# Patient Record
Sex: Male | Born: 1935 | Race: White | Hispanic: No | Smoking: Never smoker
Health system: Southern US, Community
[De-identification: ages and names within clinical notes are randomized; demographics above are authoritative.]

## PROBLEM LIST (undated history)

## (undated) DIAGNOSIS — K819 Cholecystitis, unspecified: Secondary | ICD-10-CM

## (undated) DIAGNOSIS — E039 Hypothyroidism, unspecified: Secondary | ICD-10-CM

## (undated) DIAGNOSIS — I2510 Atherosclerotic heart disease of native coronary artery without angina pectoris: Secondary | ICD-10-CM

## (undated) DIAGNOSIS — E785 Hyperlipidemia, unspecified: Secondary | ICD-10-CM

## (undated) DIAGNOSIS — I420 Dilated cardiomyopathy: Secondary | ICD-10-CM

## (undated) DIAGNOSIS — Z79899 Other long term (current) drug therapy: Secondary | ICD-10-CM

## (undated) DIAGNOSIS — I10 Essential (primary) hypertension: Secondary | ICD-10-CM

## (undated) DIAGNOSIS — J449 Chronic obstructive pulmonary disease, unspecified: Secondary | ICD-10-CM

## (undated) DIAGNOSIS — I4891 Unspecified atrial fibrillation: Secondary | ICD-10-CM

## (undated) HISTORY — DX: Atherosclerotic heart disease of native coronary artery without angina pectoris: I25.10

## (undated) HISTORY — DX: Other long term (current) drug therapy: Z79.899

## (undated) HISTORY — DX: Hyperlipidemia, unspecified: E78.5

## (undated) HISTORY — DX: Essential (primary) hypertension: I10

## (undated) HISTORY — DX: Unspecified atrial fibrillation: I48.91

## (undated) HISTORY — DX: Hypothyroidism, unspecified: E03.9

## (undated) HISTORY — PX: HIP SURGERY: SHX245

## (undated) HISTORY — DX: Dilated cardiomyopathy: I42.0

## (undated) HISTORY — PX: CORONARY STENT PLACEMENT: SHX1402

---

## 1998-06-08 ENCOUNTER — Ambulatory Visit (HOSPITAL_COMMUNITY): Admission: RE | Admit: 1998-06-08 | Discharge: 1998-06-08 | Payer: Self-pay

## 1998-10-18 ENCOUNTER — Ambulatory Visit (HOSPITAL_COMMUNITY): Admission: RE | Admit: 1998-10-18 | Discharge: 1998-10-18 | Payer: Self-pay | Admitting: *Deleted

## 1998-10-18 ENCOUNTER — Encounter: Payer: Self-pay | Admitting: Family Medicine

## 1998-11-23 ENCOUNTER — Encounter: Payer: Self-pay | Admitting: Thoracic Surgery

## 1998-11-24 ENCOUNTER — Ambulatory Visit (HOSPITAL_COMMUNITY): Admission: RE | Admit: 1998-11-24 | Discharge: 1998-11-24 | Payer: Self-pay | Admitting: Thoracic Surgery

## 1999-07-25 ENCOUNTER — Encounter: Admission: RE | Admit: 1999-07-25 | Discharge: 1999-07-25 | Payer: Self-pay | Admitting: Thoracic Surgery

## 2002-05-13 ENCOUNTER — Ambulatory Visit (HOSPITAL_COMMUNITY): Admission: RE | Admit: 2002-05-13 | Discharge: 2002-05-13 | Payer: Self-pay | Admitting: Family Medicine

## 2002-05-13 ENCOUNTER — Encounter: Payer: Self-pay | Admitting: Family Medicine

## 2002-06-23 ENCOUNTER — Ambulatory Visit (HOSPITAL_COMMUNITY): Admission: RE | Admit: 2002-06-23 | Discharge: 2002-06-23 | Payer: Self-pay | Admitting: Gastroenterology

## 2003-12-13 ENCOUNTER — Inpatient Hospital Stay (HOSPITAL_COMMUNITY): Admission: EM | Admit: 2003-12-13 | Discharge: 2003-12-15 | Payer: Self-pay | Admitting: Emergency Medicine

## 2004-01-26 ENCOUNTER — Ambulatory Visit (HOSPITAL_COMMUNITY): Admission: RE | Admit: 2004-01-26 | Discharge: 2004-01-26 | Payer: Self-pay | Admitting: Neurosurgery

## 2004-11-15 ENCOUNTER — Ambulatory Visit: Payer: Self-pay | Admitting: Cardiology

## 2004-11-22 ENCOUNTER — Ambulatory Visit: Payer: Self-pay

## 2004-12-24 ENCOUNTER — Ambulatory Visit: Payer: Self-pay | Admitting: Cardiology

## 2005-12-12 ENCOUNTER — Ambulatory Visit: Payer: Self-pay | Admitting: Cardiology

## 2006-12-09 ENCOUNTER — Inpatient Hospital Stay (HOSPITAL_COMMUNITY): Admission: EM | Admit: 2006-12-09 | Discharge: 2006-12-10 | Payer: Self-pay | Admitting: Emergency Medicine

## 2006-12-09 ENCOUNTER — Ambulatory Visit: Payer: Self-pay | Admitting: Cardiology

## 2006-12-15 ENCOUNTER — Ambulatory Visit: Payer: Self-pay | Admitting: Cardiovascular Disease

## 2006-12-23 ENCOUNTER — Ambulatory Visit: Payer: Self-pay | Admitting: Cardiology

## 2006-12-23 ENCOUNTER — Encounter: Payer: Self-pay | Admitting: Cardiology

## 2006-12-23 ENCOUNTER — Ambulatory Visit: Payer: Self-pay

## 2006-12-31 ENCOUNTER — Ambulatory Visit: Payer: Self-pay | Admitting: Cardiovascular Disease

## 2006-12-31 ENCOUNTER — Ambulatory Visit: Payer: Self-pay | Admitting: Cardiology

## 2007-01-12 ENCOUNTER — Ambulatory Visit: Payer: Self-pay | Admitting: Cardiovascular Disease

## 2007-04-02 ENCOUNTER — Ambulatory Visit: Payer: Self-pay | Admitting: Cardiology

## 2007-04-11 ENCOUNTER — Encounter: Admission: RE | Admit: 2007-04-11 | Discharge: 2007-04-11 | Payer: Self-pay | Admitting: Family Medicine

## 2007-04-25 ENCOUNTER — Ambulatory Visit: Payer: Self-pay | Admitting: Cardiology

## 2007-04-25 ENCOUNTER — Inpatient Hospital Stay (HOSPITAL_COMMUNITY): Admission: EM | Admit: 2007-04-25 | Discharge: 2007-04-26 | Payer: Self-pay | Admitting: *Deleted

## 2007-05-06 ENCOUNTER — Ambulatory Visit: Payer: Self-pay | Admitting: Cardiology

## 2007-05-14 ENCOUNTER — Ambulatory Visit: Payer: Self-pay | Admitting: Cardiology

## 2007-05-14 ENCOUNTER — Inpatient Hospital Stay (HOSPITAL_COMMUNITY): Admission: EM | Admit: 2007-05-14 | Discharge: 2007-05-24 | Payer: Self-pay | Admitting: Emergency Medicine

## 2007-05-15 HISTORY — PX: CARDIAC CATHETERIZATION: SHX172

## 2007-05-27 ENCOUNTER — Ambulatory Visit: Payer: Self-pay | Admitting: Cardiology

## 2007-06-10 ENCOUNTER — Ambulatory Visit: Payer: Self-pay

## 2007-09-02 ENCOUNTER — Ambulatory Visit: Payer: Self-pay

## 2007-09-11 ENCOUNTER — Encounter: Payer: Self-pay | Admitting: Cardiology

## 2007-09-11 ENCOUNTER — Ambulatory Visit: Payer: Self-pay | Admitting: Cardiology

## 2007-11-18 ENCOUNTER — Ambulatory Visit: Payer: Self-pay | Admitting: Cardiology

## 2008-03-30 ENCOUNTER — Ambulatory Visit: Payer: Self-pay | Admitting: Cardiology

## 2008-05-03 ENCOUNTER — Ambulatory Visit: Payer: Self-pay | Admitting: Vascular Surgery

## 2008-06-15 ENCOUNTER — Ambulatory Visit: Payer: Self-pay | Admitting: Cardiology

## 2008-11-23 ENCOUNTER — Ambulatory Visit: Payer: Self-pay | Admitting: Cardiology

## 2009-01-05 DIAGNOSIS — I1310 Hypertensive heart and chronic kidney disease without heart failure, with stage 1 through stage 4 chronic kidney disease, or unspecified chronic kidney disease: Secondary | ICD-10-CM | POA: Insufficient documentation

## 2009-01-05 DIAGNOSIS — E785 Hyperlipidemia, unspecified: Secondary | ICD-10-CM

## 2009-01-05 DIAGNOSIS — N19 Unspecified kidney failure: Secondary | ICD-10-CM

## 2009-01-05 DIAGNOSIS — I428 Other cardiomyopathies: Secondary | ICD-10-CM

## 2009-03-31 ENCOUNTER — Encounter (INDEPENDENT_AMBULATORY_CARE_PROVIDER_SITE_OTHER): Payer: Self-pay | Admitting: *Deleted

## 2009-05-31 ENCOUNTER — Ambulatory Visit: Payer: Self-pay | Admitting: Cardiology

## 2009-08-30 ENCOUNTER — Encounter (INDEPENDENT_AMBULATORY_CARE_PROVIDER_SITE_OTHER): Payer: Self-pay | Admitting: *Deleted

## 2009-09-05 ENCOUNTER — Encounter (INDEPENDENT_AMBULATORY_CARE_PROVIDER_SITE_OTHER): Payer: Self-pay | Admitting: Cardiology

## 2009-11-29 ENCOUNTER — Ambulatory Visit: Payer: Self-pay | Admitting: Cardiology

## 2010-11-13 NOTE — Assessment & Plan Note (Signed)
Summary: [facility]  Medications Added FENOFIBRATE 54 MG TABS (FENOFIBRATE) 1 by mouth daily HYDROCHLOROTHIAZIDE 25 MG TABS (HYDROCHLOROTHIAZIDE) 1 by mouth daily LEVOTHROID 125 MCG TABS (LEVOTHYROXINE SODIUM) 1 by mouth daily CARVEDILOL 12.5 MG TABS (CARVEDILOL) 1 by mouth two times a day LISINOPRIL 40 MG TABS (LISINOPRIL) 1 by mouth daily WARFARIN SODIUM 5 MG TABS (WARFARIN SODIUM) as directed AMLODIPINE BESYLATE 10 MG TABS (AMLODIPINE BESYLATE) 1 by mouth daily GABAPENTIN 300 MG CAPS (GABAPENTIN) 3 by mouth daily      Allergies Added: ! PENICILLIN  Visit Type:  Follow-up Primary Provider:  Dr. Elana Alm  CC:  CAD/cardiomyopathy.  History of Present Illness: The patient presents for six-month followup. Since I last saw him he has had no further problems.  He denies any chest discomfort, neck or arm discomfort. He has no palpitations, presyncope or syncope. He has had no PND or orthopnea.  He is compliant with his medications. He is up-to-date with a chest x-ray done in July and with his ophthalmologic examinations.  Current Medications (verified): 1)  Pravastatin Sodium 40 Mg Tabs (Pravastatin Sodium) .... Take One Tablet By Mouth Daily At Bedtime 2)  Amiodarone Hcl 200 Mg Tabs (Amiodarone Hcl) .Marland Kitchen.. 1 By Mouth Daily 3)  Amlodipine Besylate 10 Mg Tabs (Amlodipine Besylate) .Marland Kitchen.. 1 By Mouth Daily 4)  Fenofibrate 54 Mg Tabs (Fenofibrate) .Marland Kitchen.. 1 By Mouth Daily 5)  Hydrochlorothiazide 25 Mg Tabs (Hydrochlorothiazide) .Marland Kitchen.. 1 By Mouth Daily 6)  Levothroid 125 Mcg Tabs (Levothyroxine Sodium) .Marland Kitchen.. 1 By Mouth Daily 7)  Carvedilol 12.5 Mg Tabs (Carvedilol) .Marland Kitchen.. 1 By Mouth Two Times A Day 8)  Lisinopril 40 Mg Tabs (Lisinopril) .Marland Kitchen.. 1 By Mouth Daily 9)  Warfarin Sodium 5 Mg Tabs (Warfarin Sodium) .... As Directed 10)  Amlodipine Besylate 10 Mg Tabs (Amlodipine Besylate) .Marland Kitchen.. 1 By Mouth Daily 11)  Gabapentin 300 Mg Caps (Gabapentin) .... 3 By Mouth Daily  Allergies (verified): 1)  !  Penicillin  Past History:  Past Medical History: Dilated cardiomyopathy (felt to be secondary to atrial fibrillation.  His EF is about 40%), persistent atrial fibrillation treated with a amiodarone and cardioversion, chronic Coumadin therapy, coronary artery disease (last catheterization in August 2008 demonstrated the LAD 40% stenosis.   There was stent in the LAD which had 20% in-stent restenosis.  This was followed by 40 to 50% distal stenosis.  There was a 50% diagonal stenosis.  There was 40% to 50% circumflex stenosis.  There was 70% OM1 stenosis and 30% ostial right coronary artery stenosis.  There was 30% proximal right coronary artery stenosis), hypertension, hypothyroidism (possibly related to amiodarone), and dyslipidemia.   Past Surgical History: Left hip surgery  Review of Systems       As stated in the HPI and negative for all other systems.   Vital Signs:  Patient profile:   77 year old male Height:      67 inches Weight:      196 pounds BMI:     30.81 Pulse rate:   49 / minute Resp:     16 per minute BP sitting:   124 / 64  (right arm)  Vitals Entered By: Marrion Coy, CNA (November 29, 2009 12:03 PM)  Physical Exam  General:  Well developed, well nourished, in no acute distress. Head:  normocephalic and atraumatic Eyes:  PERRLA/EOM intact; conjunctiva and lids normal. Mouth:  edentulous, oral mucosa normal. Neck:  Neck supple, no JVD. No masses, thyromegaly or abnormal cervical nodes. Chest Wall:  no deformities or breast masses noted Lungs:  Clear bilaterally to auscultation and percussion. Heart:  Non-displaced PMI, chest non-tender; regular rate and rhythm, S1, S2 without murmurs, rubs or gallops. Carotid upstroke normal, no bruit. Normal abdominal aortic size, no bruits. Femorals normal pulses, no bruits. Pedals normal pulses. No edema, no varicosities. Abdomen:  Bowel sounds positive; abdomen soft and non-tender without masses, organomegaly, or  hernias noted. No hepatosplenomegaly. Msk:  Back normal, normal gait. Muscle strength and tone normal. Extremities:  No clubbing or cyanosis. Neurologic:  Alert and oriented x 3. Skin:  Intact without lesions or rashes. Psych:  Normal affect.   EKG  Procedure date:  11/29/2009  Findings:      sinus bradycardia, left bundle branch block, left axis deviation, premature ventricular contraction  Impression & Recommendations:  Problem # 1:  CARDIOMYOPATHY (ICD-425.4) The patient has class I symptoms. At this point he is on a reasonable medical regimen. I will make no change to this. Orders: EKG w/ Interpretation (93000)  Problem # 2:  HYPERTENSION (ICD-401.9) His blood pressure is controlled and he will continue on the meds as listed.  Problem # 3:  ATRIAL FIBRILLATION (ICD-427.31) He is maintaining sinus rhythm. He tolerates the amiodarone. He is up-to-date with his eye exam and chest x-ray. He knows he needs these yearly. I will send him for thyroid and liver tests.  Patient Instructions: 1)  Your physician recommends that you schedule a follow-up appointment in: 1 year in [place] 2)  Your physician recommends that you return for lab work for [facility] and liver profile 428.22 414.01  3)  Your physician recommends that you continue on your current medications as directed. Please refer to the Current Medication list given to you today. 4)  You have been diagnosed with atrial fibrillation.  Atrial fibrillation is a condition in which one of the upper chambers of the heart has extra electrical cells causing it to beat very fast.  Please see the handout/brochure given to you today for further information. 5)  You have been diagnosed with Congestive Heart Failure or CHF.  CHF is a condition in which a problem with the structure or function of the heart impairs its ability to supply sufficient blood flow to meet the body's needs.  For further information please visit www.cardiosmart.org for  detailed information on CHF. 6)  Your physician recommends that you weigh, daily, at the same time every day, and in the same amount of clothing.  Please record your daily weights on the handout provided and bring it to your next appointment.

## 2010-12-12 ENCOUNTER — Ambulatory Visit (INDEPENDENT_AMBULATORY_CARE_PROVIDER_SITE_OTHER): Payer: Medicare Other | Admitting: Cardiology

## 2010-12-12 ENCOUNTER — Encounter: Payer: Self-pay | Admitting: Cardiology

## 2010-12-12 DIAGNOSIS — I2510 Atherosclerotic heart disease of native coronary artery without angina pectoris: Secondary | ICD-10-CM | POA: Insufficient documentation

## 2010-12-20 NOTE — Assessment & Plan Note (Signed)
Summary: 1 YR ROV 428.22  414.01 PFN, RN  Medications Added LIPITOR 40 MG TABS (ATORVASTATIN CALCIUM) 1 by mouth daily LEVOTHROID 150 MCG TABS (LEVOTHYROXINE SODIUM) 1 by mouth daily      Allergies Added:   Visit Type:  Follow-up Primary Provider:  Dr. Elana Alm  CC:  CAD and Atrial fibrillation.  History of Present Illness: The patient presents for yearly followup.   Since I last saw him he has had no new cardiovascular complaints. In particular he has had chest pressure, neck or arm discomfort. He has no palpitations, presyncope or syncope. He has had no new shortness of breath, PND or orthopnea. He has no new weight gain or edema. He doesn't exercise but he remains active. Of note he does complain of some burning in his legs particularly at night and occasionally a sensation of being hot or tingly all over but both of these symptoms are not reproducible and are intermittent.  Current Medications (verified): 1)  Lipitor 40 Mg Tabs (Atorvastatin Calcium) .Marland Kitchen.. 1 By Mouth Daily 2)  Amiodarone Hcl 200 Mg Tabs (Amiodarone Hcl) .Marland Kitchen.. 1 By Mouth Daily 3)  Fenofibrate 54 Mg Tabs (Fenofibrate) .Marland Kitchen.. 1 By Mouth Daily 4)  Hydrochlorothiazide 25 Mg Tabs (Hydrochlorothiazide) .Marland Kitchen.. 1 By Mouth Daily 5)  Levothroid 150 Mcg Tabs (Levothyroxine Sodium) .Marland Kitchen.. 1 By Mouth Daily 6)  Carvedilol 12.5 Mg Tabs (Carvedilol) .Marland Kitchen.. 1 By Mouth Two Times A Day 7)  Lisinopril 40 Mg Tabs (Lisinopril) .Marland Kitchen.. 1 By Mouth Daily 8)  Warfarin Sodium 5 Mg Tabs (Warfarin Sodium) .... As Directed 9)  Amlodipine Besylate 10 Mg Tabs (Amlodipine Besylate) .Marland Kitchen.. 1 By Mouth Daily  Allergies (verified): 1)  ! Penicillin  Past History:  Past Medical History: Reviewed history from 11/29/2009 and no changes required. Dilated cardiomyopathy (felt to be secondary to atrial fibrillation.  His EF is about 40%), persistent atrial fibrillation treated with a amiodarone and cardioversion, chronic Coumadin therapy, coronary artery disease  (last catheterization in August 2008 demonstrated the LAD 40% stenosis.   There was stent in the LAD which had 20% in-stent restenosis.  This was followed by 40 to 50% distal stenosis.  There was a 50% diagonal stenosis.  There was 40% to 50% circumflex stenosis.  There was 70% OM1 stenosis and 30% ostial right coronary artery stenosis.  There was 30% proximal right coronary artery stenosis), hypertension, hypothyroidism (possibly related to amiodarone), and dyslipidemia.   Past Surgical History: Reviewed history from 11/29/2009 and no changes required. Left hip surgery  Review of Systems       Feet burning, tingling.  Otherwise all other reveiw of systems negatvie.  Vital Signs:  Patient profile:   77 year old male Height:      67 inches Weight:      191 pounds BMI:     30.02 Pulse rate:   50 / minute Resp:     18 per minute BP sitting:   126 / 72  (right arm)  Vitals Entered By: Marrion Coy, CNA (December 12, 2010 10:18 AM)  Physical Exam  General:  Well developed, well nourished, in no acute distress. Head:  normocephalic and atraumatic Eyes:  PERRLA/EOM intact; conjunctiva and lids normal. Neck:  Neck supple, no JVD. No masses, thyromegaly or abnormal cervical nodes. Chest Wall:  no deformities or breast masses noted Lungs:  Clear bilaterally to auscultation and percussion. Heart:  Non-displaced PMI, chest non-tender; regular rate and rhythm, S1, S2 without murmurs, rubs or gallops. Carotid upstroke normal, no  bruit. Normal abdominal aortic size, no bruits. Femorals normal pulses, no bruits. Pedals normal pulses. No edema, no varicosities. Abdomen:  Bowel sounds positive; abdomen soft and non-tender without masses, organomegaly, or hernias noted. No hepatosplenomegaly. Msk:  Back normal, normal gait. Muscle strength and tone normal. Extremities:  No clubbing or cyanosis. Neurologic:  Alert and oriented x 3. Skin:  Intact without lesions or rashes. Psych:  Normal  affect.    EKG  Procedure date:  12/12/2010  Findings:      Sinus bradycardia., LBBB, LAD, no change  Impression & Recommendations:  Problem # 1:  CAD (ICD-414.00) Is having no new symptoms. No further cardiovascular testing is suggested. We will continue with risk reduction.  I did discuss with him the probability that I do stress testing next year as it will have been 5 years since the last test.  Problem # 2:  HYPERTENSION (ICD-401.9) His blood pressure is controlled. Will continue the meds as listed.  Problem # 3:  ATRIAL FIBRILLATION (ICD-427.31) He has had no symptomatic arrhythmias. He tolerates amiodarone. He does have his thyroid medication regulated in all of his labs followed routinely.  Orders: EKG w/ Interpretation (93000)  Problem # 4:  DYSLIPIDEMIA (ICD-272.4) He recently was the Lipitor for better control but he is having trouble weakness. He's been on Crestor, simvastatin and pravastatin. I asked him to discuss his options with Dr. Elana Alm  Patient Instructions: 1)  Your physician recommends that you schedule a follow-up appointment in: 1 yr with Dr [place] in [place] 2)  Your physician recommends that you continue on your current medications as directed. Please refer to the Current Medication list given to you today.

## 2011-02-07 ENCOUNTER — Other Ambulatory Visit: Payer: Self-pay | Admitting: Cardiology

## 2011-02-26 NOTE — Assessment & Plan Note (Signed)
[facility]                            CARDIOLOGY OFFICE NOTE   NAME:[name]                   MRN:          [id number]  DATE:06/15/2008                            DOB:          [birth year]    PRIMARY CARE PHYSICIAN:  Bennie Pierini, Nurse Practitioner   REASON FOR PRESENTATION:  Evaluate the patient with cardiomyopathy and  atrial fibrillation.   HISTORY OF PRESENT ILLNESS:  The patient is an 77 year old.  He presents  for followup of the above.  In the last point, he was noted to be  hypothyroid probably related to his amiodarone.  He had been started on  Synthroid.  Since I saw him, his TSH is down to 30 from 70, and his T3  and T4 are within normal range, though low.  He has had his Synthroid  dose just recently increased.  Of note, he was instructed to stop his  simvastatin because the amiodarone and simvastatin interaction, but did  not do this.  He has had no new symptoms since I last saw him.  He  occasionally gets fatigued.  He says he feels better if he works.  He is  not having any cardiovascular symptoms.  Particularly, he is not having  shortness of breath, PND, or orthopnea.  He has not felt any  palpitation, presyncope, or syncope.  He has had no chest discomfort,  neck, or arm discomfort.  He is active, still working at [employer].   PAST MEDICAL HISTORY:  Dilated myopathy (felt to be secondary to atrial  fibrillation, his EF about 40%), persistent atrial fibrillation treated  with amiodarone and cardioversion, chronic Coumadin therapy, coronary  artery disease (last catheterization in August 2008, demonstrated the  LAD 40% stenosed.  There is a stent in the LAD, which had 20% in-stent  restenosis.  This was followed by 40 to 50% distal stenosis.  There was  50% diagonal stenosis.  There was 40 to 50 circumflex stenosis.  There  was 70% OM1 stenosis and 30% ostial right coronary artery stenosis.  There 30% proximal  right coronary artery stenosis), hypertension,  hypothyroidism (possibly related to amiodarone), and dyslipidemia.   ALLERGIES:  PENICILLIN.   MEDICATIONS:  1. Lisinopril 40 mg daily.  2. Amiodarone 200 mg daily.  3. Carvedilol 12.5 mg b.i.d.  4. Calcium.  5. Hydrochlorothiazide 25 mg daily.  6. Multivitamin.  7. Coumadin.  8. Amlodipine 10 mg daily.  9. Simvastatin 40 mg daily.  10.Levothyroxine 100 mcg daily.  11.Fenofibrate 54 mg daily.   REVIEW OF SYSTEMS:  As stated in the HPI,otherwise, negative other  systems.   PHYSICAL EXAMINATION:  GENERAL:  The patient is in no distress.  VITAL SIGNS:  Blood pressure 124/66, heart rate 40 and regular, weight  187 pounds, and body mass index 30.  HEENT:  Eyes unremarkable; pupils equal, round, reactive to light; fundi  not visualized; oral mucosa unremarkable.  NECK:  No jugular distention at 45 degrees; carotid upstroke brisk and  symmetrical.  No bruits and no thyromegaly.  LYMPHATICS:  No cervical, axillary, or inguinal adenopathy.  LUNGS:  Clear to auscultation bilaterally.  BACK:  No costovertebral angle tenderness.  CHEST:  Unremarkable.  HEART:  PMI not displaced or sustained; S1 and S2 within normal limits;  no S3, no S4, no clicks, no rubs, and no murmurs.  ABDOMEN:  Flat,  positive bowel sounds; normal in frequency and pitch; no bruits, no  rebound, no guarding, no midline pulsatile mass, no hepatomegaly, and no  splenomegaly.  SKIN:  No rashes, no nodules.  EXTREMITIES:  A 2+ pulses throughout; no edema, no cyanosis, and no  clubbing.  NEUROLOGIC:  Oriented to person, place, and time; cranial nerves II  through XII are grossly intact; motor grossly intact.   EKG sinus rhythm, rate 48, interventricular conduction delay, rate  slightly slower than previous, but otherwise unchanged.   ASSESSMENT AND PLAN:  1. Atrial fibrillation.  The patient is in sinus bradycardia.  He      seems to be tolerating this.  At this  point, I am not going to back      off on the carvedilol.  His fatigue is not consistent and he is      able to be active.  He has not had any tachy palpitations.  I am      going to leave him on the amiodarone.  We will discuss the thyroid      below.  We discussed needing an eye exam yearly and he clearly      understands this.  I am going to get a chest x-ray and liver      enzymes today.  2. Cardiomyopathy.  I would like to continue on the meds as listed.      He is euvolemic.  3. Hypertension.  Blood pressure is well-controlled and he will      continue meds as listed.  If he remains bradycardic, we may need      discontinue the amlodipine, which could have a small affect.  I      that case, I would probably add spironolactone for blood pressure      control.  4. Hypothyroidism.  He has had his Synthroid adjusted and I think we      can overcome this with thyroid replacement therapy without      discontinuing the amiodarone.  5. Dyslipidemia.  I have taken off the simvastatin.  I put him on      pravastatin.  He remains on TriCor appeared to be followed by the      pharmacist of [place].  6. Follow up.  I will see him back in 6 months or sooner if needed.    Rollene Rotunda, MD, [facility]  Electronically Signed   JH/MedQ  DD: 06/15/2008  DT: 06/16/2008  Job #: [id number]   cc:   Bennie Pierini, NP

## 2011-02-26 NOTE — H&P (Signed)
NAMEMACSEN, NUTTALL NO.:  [id number]   MEDICAL RECORD NO.:  [id number]          PATIENT TYPE:  INP   LOCATION:  4734                         FACILITY:  [facility]   PHYSICIAN:  Jesse Sans. Wall, MD, FACCDATE OF BIRTH:  January 14, 1926   DATE OF ADMISSION:  05/14/2007  DATE OF DISCHARGE:                              HISTORY & PHYSICAL   PRIMARY CARDIOLOGIST:  Dr. Angelina Sheriff in [place].   PRIMARY CARE Viridiana Spaid:  Molly Maduro Day in [place].   PATIENT PROFILE:  A 77 year old Caucasian male with history of CAD and A  fib who presents with progressive dyspnea over the past week.   PROBLEM:  1. CAD.      a.     December 14, 2003 cardiac catheterization/ PCI left main minor       irregularities, LAD calcified diffusely throughout, 30-40% mid.       90% mid.  40-50% distal.  Left circumflex 50% mid.  OM1 60%       proximal.  RCA 50% ostial.  30-40% mid.  PDA 30-40%.  EF 55% with       inferior hypokinesis.  The LAD was successfully stented a 2.5 x 12       mm Taxus drug-eluting stent.  2. Hypertension.  3. Hyperlipidemia.  4. Atrial fibrillation on chronic Coumadin anticoagulation.  5. Remote left hip surgery.  6. Gout.  7. Osteoarthritis.   HISTORY OF PRESENT ILLNESS:  This is a 77 year old Caucasian male with a  history of CAD and A fib with RVR, recently admitted for RVR July 12  through July 13 with titration of beta blocker and improvement in heart  rate.  Following discharge he felt well although somewhat weak for  approximately two weeks and then last Friday he complained of chest  discomfort while seeing Dr. Morrie Sheldon.  He was given sublingual nitroglycerin  on the spot with relief and then placed on Imdur therapy which  unfortunately caused headaches and he stopped this three days after  initiation.  Over the past five to six days he has been having  progressive dyspnea on exertion now with minimal activity associated  with mild chest pressure resolving with rest.  He had a  very difficult  night last night feeling orthopneic and restless and decided to present  to the ED today.  Here his ECG shows A fib with rates in the 80s to low  100s and no acute ST or T changes.  His first set of cardiac markers are  negative.  He denies any chest pain or shortness of breath currently.   ALLERGIES:  PENICILLIN causes hives.  LIPITOR caused myalgias.   MEDICATIONS:  1. Norvasc 5 mg daily.  2. Toprol XL 150 mg daily.  3. Tricor 48 mg daily.  4. Aspirin 81 mg daily.  5. Coumadin as prescribed.  6. HCTZ 25 mg daily.  7. Lisinopril 20 mg daily.  8. Tramadol 50 mg daily.  9. Allopurinol 100 mg daily.  10.He did receive Lasix 60 mg IV x1 here in the ED.   FAMILY HISTORY:  Mother died  at age 1 of an MI.  Father died at 35 of  an MI.  He has six brothers and three sisters.  There is a history of  diabetes and stroke in his family.   SOCIAL HISTORY:  He lives in [place] by himself.  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  He has two daughters.  One is deceased.  He denies tobacco,  alcohol or drug use.  He is active, although does not exercise.   REVIEW OF SYSTEMS:  Positive for chest pain, shortness breath, dyspnea  exertion, and weakness.  All as outlined in the HPI, otherwise all  systems reviewed and negative.   PHYSICAL EXAMINATION:  VITAL SIGNS:  Temperature 97.8, heart rate 77,  respirations 24, blood pressure 140/80, pulse ox 99% on 2 liters.  GENERAL APPEARANCE:  Pleasant white male in no acute distress.  Awake,  alert and oriented x3.  NECK:  No bruits or JVD.  LUNGS:  Respirations regular and unlabored.  Clear to auscultation.  CARDIAC:  Irregular regular.  S1, S2.  No murmurs.  ABDOMEN:  Round, soft, nontender, nondistended.  Bowel sounds present  x4.  EXTREMITIES:  Warm, dry, pink.  No clubbing, cyanosis or edema.  Dorsalis pedis and posterior tibial pulses 2+ and equal bilaterally.  There were no femoral bruits.   LABORATORY DATA:  Chest x-ray shows  cardiomegaly without evidence of  pulmonary vascular congestion.  EKG shows A fib at a rate of 94.  There  is an ICD.  Lab work:  Hemoglobin 15.7, hematocrit 47.2, WBC 7.2,  platelets 240.  Sodium 142, potassium 3.8, chloride 109, CO2 22.1, BUN  19, creatinine 1.4, glucose 132.  CK-MB less than 1.0.  Troponin-I less  than 0.05.  PT 24.4, INR 2.1, D-dimer less than 0.22.  BNP 853.0.   ASSESSMENT/PLAN:  1. Unstable angina.  The patient reports a six day history of      exertional dyspnea with occasional episodes of chest pressure      improved with rest.  He notes that dyspnea was his anginal      equivalent in the past.  Will plan to admit and cycle cardiac      enzymes.  Continue aspirin, beta blocker, ACE inhibitor and      fibrate.  There is no evidence of CHF on exam or chest x-ray.  Will      hold his Coumadin.  Would plan for cath once his INR comes down.      Will tentatively put him on for tomorrow and check an INR tomorrow      afternoon.  2. Hypertension, blood pressure slightly elevated.  Will follow and      titrate medications as necessary.  3. Hyperlipidemia.  Check lipids and LFTs.  Continue fibrate.  He has      intolerance to statin in the past.  4. Atrial fibrillation, variable rate control.  Will titrate his beta      blocker.  Question contribution of symptoms although suspect not      much.  Hold Coumadin and plan heparin bridge once INR less than      2.0.  5. Renal insufficiency, creatinine is 1.4.  Follow and hydrate pre      cath.      Nicolasa Ducking, ANP      Jesse Sans. Daleen Squibb, MD, [facility]  Electronically Signed    CB/MEDQ  D:  05/14/2007  T:  05/15/2007  Job:  [id number]

## 2011-02-26 NOTE — Cardiovascular Report (Signed)
[name], [name] NO.:  [id number]   MEDICAL RECORD NO.:  [id number]          PATIENT TYPE:  INP   LOCATION:  4734                         FACILITY:  [facility]   PHYSICIAN:  Arturo Morton. Riley Kill, MD, FACCDATE OF BIRTH:  28-Oct-1925   DATE OF PROCEDURE:  DATE OF DISCHARGE:                            CARDIAC CATHETERIZATION   INDICATIONS:  [name] is a 77 year old gentleman who has atrial  fibrillation.  He recently has been fairly rapid, and he was admitted  for better rate control earlier in the month.  He now presents with what  sounds like heart failure symptoms and some chest pain.  He has had  previous stenting of the left anterior descending artery.  The current  study was done to assess coronary anatomy.   PROCEDURE:  1. Left heart catheterization.  2. Selective coronary arteriography.  3. Selective left ventriculography.   DESCRIPTION OF PROCEDURE:  The patient was brought to the  catheterization laboratory and prepped and draped in the usual fashion.  Through an anterior puncture,  the right femoral artery was easily  entered, and a 5-French sheath was placed.  We were able to obtain views  of the left coronary artery but inadequately opacifying it with a 5-  [place] catheter.  A view of the right coronary arteries in two  projections were obtained. Central aortic and left ventricular pressures  were measured with a pigtail, and ventriculography was performed in the  RAO projection. Following this, we used a JL-4 guiding catheter to  engage the left main, and take better views of the left coronary artery.  He tolerated the procedure reasonably well and was taken to the holding  area in satisfactory clinical condition.   HEMODYNAMIC DATA:  1. Central aortic pressure was 105/103 with a mean of 110.  2. Left ventricular pressure was 156/23.  3. There was no gradient or pullback across the aortic valve.   ANGIOGRAPHIC DATA:  1. On ventriculography, there  was significant variability and heart      rate.  Ventriculography revealed significant decrease in left      ventricular function, with an estimated ejection fraction in the      15% range.  There was also at least mild mitral regurgitation.  The      findings revealed global hypokinesis with the perhaps even more      severe hypokinesis of the inferior segment.  2. The right coronary artery demonstrates about 30% narrowing at about      20% to 30% mid-narrowing.  It is a moderate caliber vessel.  The      posterior descending and posterolateral branches supply only the      inferobasal segment.  3. The left main coronary artery is calcified but is without critical      narrowing.  4. The left anterior descending artery courses to the apex.  Following      two diagonal branches is a previously placed Taxus drug-eluting      stent.  There is about 30% to 40% narrowing just after the second  diagonal leading into the stented site, but the stent has less than      20% narrowing.  The distal LAD demonstrates diffuse luminal      irregularity with about 40% to 50% mid-stenosis.  The very first      diagonal branch, which comes out at about a 90-degree angle has      probably about a 90% area of focal stenosis.  There is filling.      This was noted on the previous study to some extent, although it      perhaps is slightly tighter.  The second diagonal branch has      probably about 50-70% ostial stenosis, but does not appear to be      compromised.  5. The circumflex is a fairly large vessel.  There are two tiny      marginal branches with some ostial irregularities, but they are      both quite small.  The very large marginal has about 70% proximal      narrowing.  This was noted on the previous angiographic study, and      the vessel itself is fairly large.  The AV circumflex has probably      40-50% right at this takeoff and supplies a posterolateral segment.   CONCLUSIONS:  1.  Severe reduction in left ventricular function, probably related to      poor ventricular rate control with atrial fibrillation.  The left      ventricular function is much worse than on the previous study of      2003.  2. Continued patency of the Taxus drug-eluting stent.  3. Moderate stenosis of the large marginal branch as noted above.  4. Fairly high-grade stenosis of a small first diagonal branch, with a      90-degree take-off from the a main vessel.   DISPOSITION:  At the present time, rate control will be the top  priority.  We will add Lovenox, and I will get an EP consult.      Arturo Morton. Riley Kill, MD, [facility]  Electronically Signed     TDS/MEDQ  D:  05/18/2007  T:  05/18/2007  Job:  [id number]   cc:   Rollene Rotunda, MD, [facility]

## 2011-02-26 NOTE — Assessment & Plan Note (Signed)
HEALTHCARE                            CARDIOLOGY OFFICE NOTE   NAME:[name], [name]                   MRN:          [id number]  DATE:11/18/2007                            DOB:          September 22, 1926    PRIMARY CARE PHYSICIAN:  Bennie Pierini NP.   REASON FOR PRESENTATION:  Evaluate patient with cardiomyopathy, atrial  fibrillation, hypertension.   HISTORY OF PRESENT ILLNESS:  The patient returns for follow-up.  The  last visit I restarted Norvasc for better control of blood pressure.  I  had been titrating this med down in favor of ACE inhibitors for  management of his cardiomyopathy.  I did order an echocardiogram which  demonstrated that his EF had been 15%.  It is now up to 40%.  However,  it is clear his blood pressure is not yet at target.  He reports blood  pressure frequently in the 150's to 170's at home.  He has been bothered  at night by periodic episodes of feeling hot and having sweating spells.  He feels discomfort in his legs and the bottom of his feet.  He has to  get up out of bed.  This has happened for instance for the last three  nights.  He denies any shortness of breath.  He has not had any PND or  orthopnea.  He has not been having any palpitations, presyncope or  syncope.   PAST MEDICAL HISTORY:  Dilated cardiomyopathy (felt to be secondary to  atrial fibrillation, current EF is now 40%), persistent atrial  fibrillation treated with amiodarone and cardioversion, chronic Coumadin  therapy, coronary artery disease (last catheterization August  demonstrated the LAD at 40% stenosis.  There was a stent in the LAD  which had 20% in-stent restenosis followed by 40-50% distal stenosis.  There was 50% diagonal stenosis.  There was 40-50% circumflex stenosis.  There was 70% OM1 stenosis and 30% ostial right coronary artery  stenosis.  There was 30% proximal right coronary artery stenosis),  hypertension, hyperlipidemia.   ALLERGIES:  PENICILLIN.   MEDICATIONS:  1. Lisinopril 40 mg daily.  2. Amiodarone 200 mg daily.  3. Carvedilol 12.5 mg b.i.d.  4. Calcium 500 mg daily.  5. Tricor 48 mg daily.  6. Hydrochlorothiazide 25 mg daily.  7. Multivitamin.  8. Coumadin as directed.  9. Amlodipine 5 mg daily.  10.Simvastatin 20 mg daily.   REVIEW OF SYSTEMS:  As stated in the HPI, and, otherwise, negative for  other systems.   PHYSICAL EXAMINATION:  GENERAL:  The patient is in no distress.  VITAL SIGNS:  Blood pressure 172/85, heart rate 56 and regular, weight  191 pounds, body mass index 30.  HEENT:  Eyes unremarkable, pupils equal, round, reactive to light.  Fundi not visualized, oral mucosa unremarkable.  NECK:  No jugular venous distention at 45 degrees, carotid upstrokes  brisk and symmetric.  No bruits, no thyromegaly.  LYMPHATICS:  No cervical, axillary or inguinal adenopathy.  LUNGS:  Clear to auscultation bilaterally.  BACK:  No costovertebral angle tenderness.  CHEST:  Unremarkable.  HEART:  PMI not displaced or  sustained, S1, S2 within normal limits.  No  S3, no S4.  No clicks, no rubs, no murmurs.  ABDOMEN:  Flat, positive bowel sounds normal in frequency and pitch.  No  bruits, no rebound, no guarding, no midline pulsatile mass.  No  hepatomegaly, no splenomegaly.  SKIN:  No rashes, no nodules.  EXTREMITIES:  2+ pulses throughout.  No edema, no cyanosis, no clubbing.  NEUROLOGICAL:  Oriented to person, place, time.  Cranial nerves II-XII  grossly intact, motor grossly intact.   EKG:  Sinus bradycardia, rate 56, left bundle branch block, left axis  deviation, first degree AV block.   ASSESSMENT/PLAN:  1. Cardiomyopathy.  The patient's ejection fraction has improved.  He      will continue on the medications as listed with change listed      below.  2. Hypertension.  Blood pressure is still not well-controlled.  I am      going to go back to 10 mg of amlodipine.  He can have this  followed      here and at [facility].  3. Hot flashes.  The patient describes these symptoms at night.  He      said they happened before he started on simvastatin or Zocor.  I do      not think that there is a cardiac etiology.  I do not see any of      the medications that would be implicated.  I am going to check a      TSH which I do not see as having been done.  4. Atrial fibrillation.  He is maintaining sinus rhythm on the      amiodarone.  Will do the usual surveillance with this.  He will      maintain his Coumadin.  5. Follow-up.  I would like to see him back in about three months or      sooner if needed.     Rollene Rotunda, MD, [facility]  Electronically Signed    JH/MedQ  DD: 11/18/2007  DT: 11/19/2007  Job #: [id number]   cc:   Bennie Pierini, NP

## 2011-02-26 NOTE — Assessment & Plan Note (Signed)
HEALTHCARE                            CARDIOLOGY OFFICE NOTE   NAME:[name], [name]                   MRN:          [id number]  DATE:06/10/2007                            DOB:          1926/10/14    PRIMARY CARE PHYSICIAN:  Dr. Molly Maduro Day   REASON FOR PROCEDURE:  Patient with cardiomyopathy and atrial  fibrillation.   HISTORY OF PRESENT ILLNESS:  The patient presents for followup.  He is  77 years old.  His recent history if extensively outlined in the note  from May 27, 2007.  At that time I wanted to decrease his amlodipine  so I could try to up titrate his beta blocker.  However, he remains  bradycardic.  He is not having any symptoms related to this.  He has a  rate of about 48 with premature ventricular contractions.  He is  maintaining sinus rhythm on amiodarone after cardioversion.  He is not  having any new shortness of breath.  Denies any PND or orthopnea.  His  weights have been stable.  Blood pressure has been relatively well  controlled though is slightly high at times in the 140's and 150's.   PAST MEDICAL HISTORY:  1. Dilated cardiomyopathy, persistent atrial fibrillation now status      post cardioversion (EF 15%).  2. Hypertension.  3. Coronary artery disease (status post drug eluting stent to the LAD      in 2005 by Dr. Chales Abrahams.  Most recent catheterization demonstrated a      90% diagonal stenosis, 40% LAD stenosis, 20% InStent restenosis in      the LAD, 40-50% distal LAD stenosis, 50% diagonal-2 stenosis, 40-      50% circumflex stenosis, 70% OM-1 stenosis, 30% ostial right      coronary artery stenosis, 30% proximal right coronary artery      stenosis).  4. Hyperlipidemia.   ALLERGIES:  PENICILLIN.   MEDICATIONS:  1. Amiodarone 400 mg daily.  2. Allopurinol.  3. Carvedilol 12.5 mg b.i.d.  4. Calcium.  5. Coumadin per [facility].  6. Lisinopril.  7. Aspirin 81 mg daily.  8.  Multivitamin.  9. Hydrochlorothiazide 25 mg daily.  10.TriCor 48 mg daily.   REVIEW OF SYSTEMS:  As stated in the HPI, otherwise negative for other  systems.   PHYSICAL EXAMINATION:  GENERAL:  The patient is in no distress.  VITAL SIGNS:  Blood pressure 152/62, heart rate 48 and regular, weight  186 pounds.  LUNGS:  Clear to auscultation bilaterally.  BACK:  No costovertebral angle tenderness.  CHEST:  Unremarkable.  HEART:  PMI not displaced or sustained, S1 and S2 within normal limits,  no S3, no S4, no clicks, rubs or murmurs.  ABDOMEN:  Flat, positive bowel sounds, normal in frequency and pitch, no  bruits, no rebound, guarding or midline pulsatile tenderness, no  organomegaly.  SKIN:  No rashes.  EXTREMITIES:  Pulses 2+, no edema.   ASSESSMENT AND PLAN:  1. Cardiomyopathy.  Today is going to remain on the medications as      listed with the exception described below.  I will not be able to      titrate his beta blocker.  His blood pressure is slightly elevated.      In the future I might need to restart the Norvasc though this can      contribute also to some bradycardia.  He seems to be euvolemic at      this point.  2. Coronary disease.  He will continue with secondary risk reduction.  3. Atrial fibrillation.  The patient is maintaining sinus rhythm.  We      will reduce the amiodarone to 200 mg a day.  When he comes back I      will make sure he has followup lipid, liver and thyroid.  He will      be advised about the need for chest x-ray and optic followup.   FOLLOWUP:  I will see the patient again in 3 months.     Rollene Rotunda, MD, [facility]  Electronically Signed    JH/MedQ  DD: 06/10/2007  DT: 06/11/2007  Job #: [id number]   cc:   Alfredia Client, MD

## 2011-02-26 NOTE — Assessment & Plan Note (Signed)
[facility] HEALTHCARE                            CARDIOLOGY OFFICE NOTE   NAME:[name], [name]                   MRN:          [id number]  DATE:05/31/2009                            DOB:          May 08, 1926    PRIMARY CARE PHYSICIAN:  Paulene Floor, NP   REASON FOR PRESENTATION:  Evaluate the patient with cardiomyopathy and  atrial fibrillation.   HISTORY OF PRESENT ILLNESS:  The patient returns for followup of the  above.  Since I last saw him, he continues to do well.  He has had no  new cardiovascular complaints.  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  With this, he denies any shortness of breath.  He has no resting  shortness of breath and denies any PND or orthopnea.  He has had no  chest discomfort, neck or arm discomfort.  He has had no palpitations,  presyncope, or syncope.  He has had followup and has some very mildly  elevated liver enzymes.  These are being monitored.  He has had his  thyroid adjusted.  He says he has had his eyes examined since I last saw  him.  This was all for maintenance on his amiodarone.   PAST MEDICAL HISTORY:  1. Dilated cardiomyopathy (felt to be secondary to atrial      fibrillation.  His EF was about 40% most recently.  It had been as      low as 15%).  2. Atrial fibrillation (treated with amiodarone and cardioversion.  He      is on chronic Coumadin therapy).  3. Coronary artery disease (catheterization in August 2008      demonstrated LAD 40% stenosis.  He had an LAD stent which had 20%      in-stent restenosis followed by 40-50% distal stenosis.  There was      50% diagonal stenosis.  There was 40-50% circumflex stenosis.      Obtuse marginal-1 had 70% stenosis, 30% ostial right coronary      artery stenosis, and 30% proximal right coronary stenosis).  4. Hypertension.  5. Hypothyroidism (possibly related to Amiodarone).  6. Dyslipidemia.  7. Mildly elevated liver enzymes.   ALLERGIES:  PENICILLIN.   MEDICATIONS:  1. Gabapentin.  2. Fish oil.  3. Levothyroxine 125 mcg daily.  4. Pravastatin 40 mg daily.  5. Fenofibrate 54 mg daily.  6. Amlodipine 10 mg daily.  7. Coumadin.  8. Multivitamin.  9. Hydrochlorothiazide 25 mg daily.  10.Calcium.  11.Carvedilol.  12.Amiodarone 200 mg daily.  13.Lisinopril 40 mg daily.   REVIEW OF SYSTEMS:  As stated in the HPI and otherwise negative for  other systems.   PHYSICAL EXAMINATION:  GENERAL:  The patient is in no distress.  VITAL SIGNS:  Blood pressure 152/76, heart rate 49 and regular, weight  187 pounds, body mass index 30.  HEENT:  Eyes unremarkable.  Pupils equal, round, and reactive to light.  Fundi not visualized.  Oral mucosa unremarkable.  NECK:  No jugular venous distention at 45 degrees.  Carotid upstroke  brisk and symmetric.  No bruits.  No thyromegaly.  LYMPHATICS:  No cervical, axillary, or inguinal adenopathy.  LUNGS:  Clear to auscultation bilaterally.  BACK:  No costovertebral angle tenderness.  CHEST:  Unremarkable.  HEART:  PMI not displaced or sustained.  S1 and S2 within normal limits.  No S3.  No S4.  No clicks.  No rubs.  No murmurs.  ABDOMEN:  Mildly obese.  Positive bowel sounds, normal in frequency and  pitch.  No bruits.  No rebound.  No guarding.  No midline pulsatile  mass.  No organomegaly.  SKIN:  No rashes.  No nodules.  EXTREMITIES:  Pulses 2+ throughout.  No edema.  No cyanosis.  No  clubbing.  NEUROLOGIC:  Oriented to person, place, and time.  Cranial nerves II  through XII grossly intact.  Motor grossly intact.   EKG:  Sinus bradycardia, rate 49, left bundle-branch block, left axis  deviation, first-degree AV block, no acute ST-T wave changes.   ASSESSMENT AND PLAN:  1. Atrial fibrillation.  The patient is maintaining sinus rhythm      apparently.  He is tolerating the amiodarone and has appropriate      followup.  In 1 year, I will get another chest x-ray.  He will      continue to have close  followup of his liver enzymes per his      primary team.  These have been very mildly elevated and stable.  He      is having his thyroid followed.  He gets his eyes checked yearly.      He will remain on the Coumadin.  2. Cardiomyopathy.  His ejection fraction is 40%.  He has class I      symptoms.  I cannot titrate his meds as his heart rate is too low      for increased beta-blockers.  He is on a good dose of ACE      inhibitors.  He will continue on the meds as listed.  3. Hypertension.  Blood pressure is slightly elevated today but this      is unusual.  I will make no change to his regimen.  He checks it at      home and let me know if it trends upward.  He says to the most part      it is well below 140/90.  4. Coronary disease.  He has coronary disease as described and will      continue with risk reduction.  5. Dyslipidemia.  Per [employer] with a goal LDL of less than      100 and HDL greater      than 40.  6. Followup.  I will see him in 6 months or sooner if needed.     Rollene Rotunda, MD, [facility]  Electronically Signed    JH/MedQ  DD: 05/31/2009  DT: 06/01/2009  Job #: [id number]   cc:   Paulene Floor, M.D.

## 2011-02-26 NOTE — Assessment & Plan Note (Signed)
[facility] HEALTHCARE                            CARDIOLOGY OFFICE NOTE   NAME:[name], [name]                   MRN:          [id number]  DATE:03/30/2008                            DOB:          [birth year]    PRIMARY CARE PHYSICIAN:  Dr. Bennie Pierini.   REASON FOR PRESENTATION:  Evaluate the patient with cardiomyopathy and  atrial fibrillation.   HISTORY OF PRESENT ILLNESS:  The patient presents for followup.  He is  77 years old.  At the last visit, he was noted to have hypothyroidism  possibly related to his amiodarone.  He since had Synthroid started.  The thyroid level was recently checked, and he had his dose increased to  75 mcg.  He says he is fatigued.  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  However, if he tries to weed eat, he  gets tired.  He has to stop and take breaks.  He does describe some mild  dyspnea with exertion but this is not a predominant complaint.  He has  not been having any presyncope or syncope.  He has not been having any  chest discomfort, neck or arm discomfort.  He has been having no  palpitations.   PAST MEDICAL HISTORY:  Dilated cardiomyopathy (felt to be secondary to  atrial fibrillation.  His current EF is around 40%), persistent atrial  fibrillation treated with amiodarone and cardioversion, chronic Coumadin  therapy, coronary artery disease (last catheterization in August 2008,  demonstrated LAD 40% stenosis.  There was stent in the LAD, which had  20% in-stent restenosis, followed by 40-50% distal stenosis.  There was  50% diagonal stenosis.  There was 40-50% circumflex stenosis.  There was  70% OM1 stenosis and 30% ostial right coronary artery stenosis.  There  was 30% proximal right coronary artery stenosis), hypertension, and  dyslipidemia.   ALLERGIES:  PENICILLIN.   MEDICATIONS:  1. Lisinopril 40 mg daily.  2. Amiodarone 200 mg daily.  3. Carvedilol 12.5 mg b.i.d.  4.  Calcium 500 mg daily.  5. TriCor 48 mg daily.  6. Hydrochlorothiazide 25 mg daily.  7. Coumadin.  8. Levothyroxine 75 mcg daily.  9. Amlodipine 10 mg daily.  10.Simvastatin 40 mg daily.   REVIEW OF SYSTEMS:  As stated in the HPI, and otherwise negative for  other systems.   PHYSICAL EXAMINATION:  The patient is in no distress.  Blood pressure 166/60, heart rate 53 and regular, weight 187 pounds, and  body mass index 30.  HEENT:  Eyes are unremarkable.  Pupils are equal, round, and reactive to  light.  Fundi not visualized.  Oral mucosa unremarkable.  NECK:  No jugular venous distention.  Waveform within normal limits.  Carotid upstroke brisk and symmetrical.  No bruits, thyromegaly.  LYMPHATICS:  No adenopathy.  LUNGS:  Clear to auscultation bilaterally.  BACK:  No costovertebral angle tenderness.  CHEST:  Unremarkable.  HEART:  PMI not displaced or sustained.  S1 and S2 within normal limits.  No S3.  No S4.  No clicks.  No rubs.  No murmurs.  ABDOMEN:  Flat.  Positive bowel sounds.  Normal in frequency and pitch.  No bruits.  No rebound.  No guarding.  No midline pulsatile mass.  No  organomegaly.  SKIN:  No rashes.  No lesions.  EXTREMITIES:  2+ pulses.  No edema.  No cyanosis.  No clubbing.  NEURO:  Oriented to person, place, and time.  Cranial nerves II-XII  grossly intact.  Motor grossly intact.   EKG:  Sinus bradycardia, rate 53, and left bundle-branch block.  No  acute ST-T wave changes.   ASSESSMENT/PLAN:  1. Atrial fibrillation.  The patient has maintained sinus bradycardia.      At this point, I would like to continue the amiodarone.  He has      hypothyroidism, which may be related.  This is being treated and      supplemented.  However, if we do not see some improvement in this      going forward, I may have to discontinue the amiodarone.  The next      drug would be Tikosyn to maintain his sinus rhythm.  I think it is      important to keep him in sinus rhythm  as his rate may be the cause      of his cardiomyopathy.  2. Cardiomyopathy.  The patient has no overt symptoms that are related      to this.  I think most of his fatigue is related to his thyroid.      He seems to be euvolemic.  He will continue the meds as listed.  3. Hypertension.  Blood pressure is elevated.  Recently, he has      Norvasc change.  He takes his blood pressure at home and his      systolics are typically in the 130s with diastolics in the 50s and      60s.  I will not make any change to his regimen based on this.  4. Coronary artery disease.  He is having no ongoing symptoms.  No      further cardiovascular testing is suggested.  He will continue with      secondary risk reduction.  5. Dyslipidemia per his primary team.  He is on combination therapy      with a goal LDL less than 100 and HDL greater than 40.  6. Followup.  I would like to see him back in 3 months, and we will be      keeping an eye on the thyroid.  Of course, he will also have to      have pulmonary function testing, eye examinations, and liver      testing if he remains on the amiodarone.  I am going to call the      pharmacist at      [facility] and alert them to the fact that      he should be switched from the simvastatin since there is an      amiodarone drug interaction.     Rollene Rotunda, MD, [facility]  Electronically Signed    JH/MedQ  DD: 03/30/2008  DT: 03/31/2008  Job #: [id number]   cc:   Dr. Bennie Pierini

## 2011-02-26 NOTE — Assessment & Plan Note (Signed)
[facility]                            CARDIOLOGY OFFICE NOTE   NAME:Artist, [name]                   MRN:          [id number]  DATE:09/02/2007                            DOB:          [birth year]    PRIMARY CARE PHYSICIAN:  Alfredia Client, M.D.   REASON FOR PRESENTATION:  Evaluate patient with cardiomyopathy and  atrial fibrillation.   HISTORY OF PRESENT ILLNESS:  Patient is 77 years old.  He presents for  followup.  At the last visit, I made no change to his regimen.  I had  been slowly going down on his Norvasc, up-titrated his ACE inhibitor and  his beta blocker; however, I cannot titrate his beta blocker because of  brady arrhythmias.  He is on target dose of lisinopril.  He still  remains hypertensive.  He has had no new symptoms.  He denies any chest  discomfort, neck or arm discomfort.  He denies any palpitations,  presyncope, or syncope.  He has had no PND or orthopnea.  He has been  doing stuff in the yard.   PAST MEDICAL HISTORY:  1. Dilated cardiomyopathy, felt to be secondary to atrial fibrillation      (EF had been 55% in March.  It was 15% at the time of his atrial      fibrillation hospitalization in August).  2. Persistent atrial fibrillation treated with amiodarone and      cardioversion.  On chronic Coumadin therapy.  3. Coronary artery disease (last catheterization in August      demonstrated the LAD had 40% stenosis.  There was a stent in the      LAD which had 20% end-stent restenosis and then was followed by 40-      50% distal stenosis.  There was 50% diagonal stenosis, 40-50%      circumflex stenosis, 70% OM I stenosis, 30% ostial right coronary      artery stenosis, 30% proximal right coronary artery stenosis).  4. Hypertension.  5. Hyperlipidemia.   ALLERGIES:  PENICILLIN.   MEDICATIONS:  1. Carvedilol 12.5 mg b.i.d.  2. Lisinopril 40 mg daily.  3. Tricor 48 mg daily.  4. Hydrochlorothiazide 25 mg daily.  5.  Amiodarone 200 mg daily.  6. Coumadin as directed.  7. Allopurinol 100 mg daily.   REVIEW OF SYSTEMS:  As stated in the HPI, otherwise negative for other  systems.   PHYSICAL EXAMINATION:  Patient is in no distress.  Blood pressure 168/72, heart rate 56 and regular.  Weight 184 pounds.  HEENT:  Eyelids unremarkable.  Pupils are equal, round and reactive to  light.  Fundi not visualized.  Oral mucosa unremarkable.  NECK:  No jugular venous distention at 45 degrees.  Carotid upstroke  brisk and symmetric.  No bruits, no thyromegaly.  LYMPHATICS:  No cervical, axillary, or inguinal adenopathy.  LUNGS:  Clear to auscultation bilaterally.  BACK:  No costovertebral angle tenderness.  CHEST:  Unremarkable.  HEART:  PMI not displaced or sustained.  S1 and S2 within normal limits.  No S3, no S4, no clicks, rubs, or murmurs.  ABDOMEN:  Flat, positive bowel sounds.  Normal in frequency and pitch.  No bruits, rebound, guarding.  There are no midline pulsatile masses.  No hepatomegaly, no splenomegaly.  SKIN:  No rashes, no nodules.  EXTREMITIES:  Pulses 2+ throughout.  No edema.   ASSESSMENT/PLAN:  1. Cardiomyopathy:  Today I would like to schedule him for an      echocardiogram to see if his ejection fraction has improved with      normal sinus rhythm.  This will also allow me to pick the meds for      titration.  This is described below.  2. Atrial fibrillation:  He is maintaining sinus rhythm.  He is on      Coumadin.  I have given him a handwritten note to take to Dr.      Kathi Der office.  The next time he has blood drawn, he should have a      TSH.  He has had recent liver enzymes, which were normal.  I will      make sure that he has adequate followup of his eyes, pulmonary      function testing, and other on the amiodarone.  3. Coronary artery disease:  He will continue with secondary risk      reduction.  4. Hypertension:  Blood pressure remains elevated.  I am going to       restart Norvasc 5 mg daily.  This might not be the drug I choose if      I demonstrate that his ejection fraction is still reduced.  He will      remain on the ACE dose, and I cannot titrate his beta blocker      further.  5. Gout:  He is on Allopurinol.   FOLLOW UP:  I will see the back in about two months.     Rollene Rotunda, MD, [facility]  Electronically Signed    JH/MedQ  DD: 09/02/2007  DT: 09/02/2007  Job #: [id number]   cc:   Ernestina Penna, M.D.

## 2011-02-26 NOTE — Assessment & Plan Note (Signed)
[facility]                            CARDIOLOGY OFFICE NOTE   NAME:Trevor, KYRI DAI                   MRN:          [id number]  DATE:05/27/2007                            DOB:          May 25, 1926    PRIMARY CARE PHYSICIAN:  Molly Maduro Day, MD   REASON FOR PRESENTATION:  Evaluate patient with cardiomyopathy and  atrial fibrillation.   HISTORY OF PRESENT ILLNESS:  The patient presents for followup after  hospitalization from July 31 to August 10.  He had dyspnea and was found  to have an EF of 15%.  He did have a cardiac catheterization, which  demonstrated severe global hypokinesis, there was a 90% diagonal  stenosis, 40% LAD stenosis.  There had been a stent in the LAD that had  20% in-stent restenosis, there was 40% to 50% distal LAD stenosis, 50%  diagonal 2 stenosis, 40% to 50% circumflex stenosis, 70% OM1 stenosis,  30% ostial right coronary artery, 30% proximal right coronary artery  stenosis.  It was felt that this did not explain his cardiomyopathy, but  rather it was probably related to atrial fibrillation.  He was  adequately anticoagulated and started on Amiodarone.  He underwent  cardioversion to sinus rhythm.   Since going home, he has felt much better.  He has not had any  palpitations, pre-syncope or syncope.  He has not had any PND or  orthopnea.  He thinks his breathing is back to baseline.  He has not had  any swelling.  He has not had any chest discomfort, neck or arm  discomfort.   PAST MEDICAL HISTORY:  Dilated cardiomyopathy, persistent atrial  fibrillation, now status post cardioversion, hypertension, coronary  artery disease (status post drug-eluting stent to the LAD in 2005 by Dr.  Chales Abrahams), hyperlipidemia.   ALLERGIES:  PENICILLIN.   MEDICATIONS:  1. Amiodarone 400 mg b.i.d. (He has instructions to reduce this.).  2. Amlodipine 5 mg daily.  3. Allopurinol.  4. Carvedilol 12.5 mg b.i.d.  5. Calcium.  6. Coumadin,  [facility] [place].  7. Lisinopril 20 mg daily.  8. Toprol 100 mg daily.  9. Aspirin 81 mg daily.  10.Multivitamin.  11.Hydrochlorothiazide 25 mg daily.  12.Tri-Chlor 48 mg daily.   REVIEW OF SYSTEMS:  As stated in the HPI, and otherwise negative for  other systems.   PHYSICAL EXAMINATION:  GENERAL:  The patient is in no distress.  VITAL SIGNS:  Blood pressure 126/66, heart rate 47 and regular, weight  185 pounds.  Body mass index 29.  HEENT:  Eyes unremarkable, pupils equal, round, reactive to light, fundi  not visualized.  Oral mucosa unremarkable.  NECK:  No jugular venous distention to 45 degrees, carotid upstrokes  brisk and symmetric, no bruits, no thyromegaly.  LYMPHATICS:  No cervical, axillary, inguinal adenopathy.  LUNGS:  Clear to auscultation bilaterally.  BACK:  No costovertebral angle tenderness.  CHEST:  Unremarkable.  HEART:  PMI not displaced or sustained, S1 and S2 within normal limits.  No S3, no S4, no clicks, no rubs, no murmurs.  ABDOMEN:  Flat. Positive bowel sounds, normal in frequency and pitch,  no  bruits, no rebound, no guarding, no midline pulse, no mass in groin,  organomegaly.  SKIN:  No rashes, no nodules.  EXTREMITIES:  2+ pulses, no edema.   EKG:  Sinus bradycardia, rate 47, left bundle branch block, left axis  deviation, first-degree AV block.   ASSESSMENT/PLAN:  1. Cardiomyopathy.  The etiology of this is not clear but may be      related to his atrial fibrillation.  I am going to stop his Norvasc      q. day and increase his lisinopril to 40.  I can not up-titrate his      Coreg further with his bradycardia.  He will remain on the other      medicines as listed.  He will get a BMET in 2 weeks.  2. Coronary disease.  Will manage this medically and with risk      reduction.  3. Atrial fibrillation.  He is maintaining sinus rhythm.  We will      reduce him to 400 mg of Amiodarone and eventually to 200 mg.  He      will remain on the  Coumadin.  4. Followup.  I will see him back in about 2 weeks for the next med      titration.  He will probably get an electrocardiogram again in 6      months or so, to see if he has had improvement.     Rollene Rotunda, MD, [facility]  Electronically Signed    JH/MedQ  DD: 05/27/2007  DT: 05/28/2007  Job #: [id number]   cc:   Alfredia Client, MD

## 2011-02-26 NOTE — Op Note (Signed)
NAMEAARON, [name] NO.:  [id number]   MEDICAL RECORD NO.:  [id number]          PATIENT TYPE:  INP   LOCATION:  4734                         FACILITY:  [facility]   PHYSICIAN:  Luis Abed, MD, FACCDATE OF BIRTH:  09-04-1926   DATE OF PROCEDURE:  05/22/2007  DATE OF DISCHARGE:                               OPERATIVE REPORT   [name] has atrial fib.  He has had a change in his ejection fraction.  There is a question as to whether or not he could have a rate related  cardiomyopathy.  He has been loaded with amiodarone and it is time for  cardioversion today.   Anesthesia was present.  The patient received 14 mg of IV Amidate.  Anterior-posterior pads were in place.  The biphasic defibrillator was  used.  He received 150 joules and with one shock, converted to sinus  rhythm.  He tolerated procedure well.   Successful cardioversion.      Luis Abed, MD, [facility]  Electronically Signed     JDK/MEDQ  D:  05/22/2007  T:  05/23/2007  Job:  [id number]   cc:   Arturo Morton. Riley Kill, MD, [facility]  Duke Salvia, MD, [facility]

## 2011-02-26 NOTE — H&P (Signed)
[name], SAINE NO.:  [id number]   MEDICAL RECORD NO.:  [id number]          PATIENT TYPE:  INP   LOCATION:  2030                         FACILITY:  [facility]   PHYSICIAN:  Jesse Sans. Wall, MD, FACCDATE OF BIRTH:  06-22-1926   DATE OF ADMISSION:  04/25/2007  DATE OF DISCHARGE:                              HISTORY & PHYSICAL   PRIMARY CARE PHYSICIAN:  Franklyn Lor, MD, at [facility]   PRIMARY CARDIOLOGIST:  Angelina Sheriff, MD.   HISTORY OF PRESENT ILLNESS:  This is a 77 year old male with a known  history of coronary artery disease with drug-eluting stent to the LAD in  2005, atrial fibrillation and hypertension, with complaints of shortness  of breath x one week, worsening over the week, unable to lay down very  long without having to sit up to breathe, unable to rest well.  His wife  has also noted that he is becoming weaker, unable to walk across the  kitchen without feeling worn out.  He had some minimal chest  tightness, although it is not severe.  He denies any nausea, vomiting,  diaphoresis, syncope or presyncope.  He went to [facility] this morning because of the dyspnea and had an EKG  revealing atrial fibrillation with a ventricular rate of 126 beats per  minute.  He was advised to come the [facility] emergency room.   On arrival in the [facility] emergency room, the patient was given a  Cardizem bolus and started on a Cardizem drip at 5 mg/hr.  The patient  is feeling much better, his heart rate is in the 80s, and remains in  atrial fibrillation.  He has been placed on oxygen and is without  discomfort at this time.   Of note, the patient was recently seen by Dr. Randa Evens in June of  2008, at which time the patient was feeling well, he had no complaints  of dyspnea or chest pain.  In fact, he was outside working in his yard  and enjoying his life very well.  Dr. Samule Ohm had changed him from  Bystolic10 mg daily that was started by Dr. Morrie Sheldon to replace Toprol 100 mg  daily.  The patient's Toprol was decreased from 100 to 50 mg once per  day and he had been doing well on it.  The patient had been without any  complaints until this episode today.   PAST MEDICAL HISTORY:  1. Atrial fibrillation, on chronic Coumadin.  2. Coronary artery disease; status post cardiac catheterization in      2005 with a Taxus drug-eluting stent to the distal LAD, with      residual coronary artery disease, RCA 50%, PDA 30%, circumflex 50%,      OM 60%, proximal LAD 40%.  3. The patient has a history of hypertension.  4. Hypercholesterolemia.  5. Gout.  6. Arthritis.   SOCIAL HISTORY:  The patient lives in [place] with his wife.  He is a  retired Tax inspector.  He is married with one child.  He denies  the use of alcohol or tobacco, no drugs.   FAMILY HISTORY:  Mother died at 53 with an MI.  Father died at age 41 of  unknown cause.  He has a brother who died of a CVA and complications of  diabetes.   CURRENT MEDICATIONS:  At home:   1. Tricor 48 mg once per day.  2. HCTZ 25 mg once per day.  3. Multivitamin once per day.  4. Aspirin 81 mg once per day.  5. Lisinopril 20 mg once per day.  6. Norvasc 5 mg once per day.  7. Coumadin 5 mg each day, alternating with 2.5 mg once per day.  8. Calcium 500 mg daily.  9. Toprol XL 50 mg daily.  10.Tramadol 500 mg p.r.n.  11.He has recently been taking some colchicine secondary to some gout      symptoms which have caused a mild amount of stomach upset, which      has passed since stopping the colchicine.   LABORATORY INVESTIGATIONS:  Currently, sodium 141, potassium 3.7,  chloride 108, bicarbonate 21, BUN 26, creatinine 1.4, glucose 215.  EKG  revealing atrial fibrillation with a ventricular rate of 126 beats per  minute, with occasional PVCs.  Current telemetry reveals atrial  fibrillation, 86 beats per minute.  PT 28.8, INR 2.4.  Troponin  at point  of care less than 0.05, CK-MB 1.0, myoglobin 70.2.   PHYSICAL EXAMINATION:  VITAL SIGNS:  Blood pressure 135/93, pulse 80 and  irregular, respirations 14, temperature 97.4.  HEENT:  Head is normocephalic, atraumatic.  Eyes -- pupils equal, round  and reactive to light and accommodation.  Mucous membranes in the mouth  are pink and moist.  Tongue is midline.  NECK:  The neck is supple.  There is no JVD, no carotid bruits are  appreciated.  LUNGS:  Lungs are clear to auscultation, without wheezes, rales or  rhonchi.  ABDOMEN:  The abdomen is soft, nontender.  No rebound, no guarding.  With 2+ bowel sounds.  EXTREMITIES:  No clubbing, cyanosis or edema.  No joint tenderness.  NEUROLOGIC:  Cranial nerves II-XII are grossly intact.   IMPRESSION:  1. Dyspnea.  2. Atrial fibrillation with rapid ventricular response.  3. Known coronary artery disease; status post stent placement to the      left anterior descending in 2005.  4. Hypertension.   PLAN:  This is a 77 year old very pleasant Caucasian male, a patient  formerly of Dr. Randa Evens and has now been assigned to Dr. Angelina Sheriff, who has had one week's duration of increasing shortness of  breath and generalized weakness and fatigue.  The patient was found to  have atrial fibrillation with RVR at the [facility] clinic and brought to the [facility] emergency room.  The  patient was placed on a Cardizem drip and heart rate is better  controlled, and he is asymptomatic.   Our plan will be to admit the patient; cycle CK-MBs; continue rate  control.  Will discontinue Cardizem and start him back on Toprol 100 mg  once per day as he was able to tolerate it in the past.  Will consider  repeating cardiac catheterization if symptoms return or CK-MBs are  positive.  The patient appears to be responding well to the  Cardizem, and will restart the Toprol at the higher dose that he had  been on in the  past.  The patient will be observed overnight and, if  cardiac enzymes are positive, we will proceed with catheterization; if  negative, the patient more than likely will be able to go home.  This  has been discussed with Dr. Juanito Doom who is in agreement with this plan.      Bettey Mare. Lyman Bishop, NP      Jesse Sans. Daleen Squibb, MD, [facility]  Electronically Signed    KML/MEDQ  D:  04/25/2007  T:  04/26/2007  Job:  [id number]   cc:   Franklyn Lor, MD

## 2011-02-26 NOTE — Discharge Summary (Signed)
NAMEMONTREY, BUIST NO.:  [id number]   MEDICAL RECORD NO.:  [id number]          PATIENT TYPE:  INP   LOCATION:  2030                         FACILITY:  [facility]   PHYSICIAN:  Rollene Rotunda, MD, FACCDATE OF BIRTH:  [birth year]   DATE OF ADMISSION:  04/25/2007  DATE OF DISCHARGE:  04/26/2007                               DISCHARGE SUMMARY   PRIMARY CARE PHYSICIAN:  Dr. Maisie Fus Day.   DISCHARGE DIAGNOSES:  1. Atrial fibrillation with rapid ventricular response.  2. Known coronary artery disease with TAXUS stent to the distal left      anterior descending in 2005.  Right coronary artery 50%, posterior      descending artery 30%, circumflex 50%, obtuse marginal 60% and      proximal left anterior descending 40% per cardiac catheterization      2005.  3. Hypertension.  4. Hypercholesterolemia.  5. Gout.  6. Arthritis.   HISTORY OF PRESENT ILLNESS:  This is a 77 year old Caucasian male who is  a former patient of Dr. Randa Evens who is being established Dr.  Rollene Rotunda with a known history of coronary artery disease, atrial  fibrillation and hypertension, who was admitted with complaints of  increasing shortness of breath x1 week and worsening with some  generalized weakness and fatigue.  The patient was seen at [facility].  An EKG was completed showing atrial  fibrillation with RVR, a ventricular rate of 126 beats per minute.  The  patient was brought to [facility] Emergency Room and was given IV  Cardizem bolus and started on a Cardizem drip at 5 mg an hour.  The  patient had been recently seen by Dr. Samule Ohm in June of 2008 and was  doing very well.  He was having some complaints of mild dizziness and  the patient was Toprol 100 mg 1 p.o. daily.  As a result of this, the  patient's Toprol was decreased to 50 mg 1 p.o. daily and he had been  doing well.  The patient had increasing shortness of breath and pressure  in his chest with  some fatigue over the last week and heart rate was  found to be substantially increased.   On arrival in the emergency room, the patient was seen and examined by  myself and Dr. Juanito Doom.  The Cardizem drip was eventually discontinued  after an additional dose of 50 mg of Toprol with increase in his overall  dose to 100 mg p.o. daily.  The patient tolerated the increase in  medication without difficulty and was up ambulating in the hall the  following day without any complaints of recurrent chest discomfort or  shortness of breath, which elicited his admission.  Heart rate remained  in the 80s, AFib.  The patient was seen and examined by Dr. Rollene Rotunda and was found to be stable for discharge.  He is to follow up  with Dr. Antoine Poche in the [facility] in approximately 2 weeks at a  previously scheduled appointment.  At that time, a 24-hour Holter  monitor will  be in place for evaluation by Dr. Antoine Poche.   LABS ON DISCHARGE:  Lipid panel:  Cholesterol 171, triglycerides 203,  HDL cholesterol 26, LDL 104.  Cardiac panel:  Negative x2 with a  troponin of 0.02 and 0.02 respectively.  PT 27.9, INR 2.5.  TSH 2.016.  Sodium 139, potassium 3.7, chloride 108, CO2 22, glucose 209, BUN 21,  creatinine 1.23.  Hemoglobin 14.9, hematocrit 45.3, white blood cell is  6.6, platelets 232.  Chest x-ray revealing cardiomegaly and chronic  interstitial markings.  EKG revealing atrial fibrillation with a  ventricular rate of 86 beats per minute.   VITAL SIGNS ON DISCHARGE:  Blood pressure 119/65.  Heart rate 87.  Respirations 20.  Temperature 97.2.   DISCHARGE MEDICATIONS:  1. Toprol-XL 100 mg daily (increased from 50 mg daily).  2. TriCor 48 mg daily.  3. Coated aspirin 81 mg daily.  4. Lisinopril 20 mg daily.  5. Norvasc 5 mg daily.  6. Coumadin 2.5 alternating with 5 mg p.o. daily (PT/INR checked by      primary care physician, Dr. Morrie Sheldon).  7. Calcium 500 mg daily.  8. Protonix 40 mg  daily.   ALLERGIES:  TO PENICILLIN.   FOLLOWUP PLANS AND APPOINTMENT:  1. The patient will follow up with Dr. Rollene Rotunda in the [facility] on a previously scheduled appointment in approximately 2      weeks.  2. The patient will follow up with Dr. Morrie Sheldon, primary care physician,      for continued medical management and following a PT/INR.  3. The patient will have a Holter monitor placed for evaluation of      heart rate control on new dose of Toprol.  Our office will call to      notify when one is available.   Time spent with the patient, to include physician time, 40 minutes.      Bettey Mare. Lyman Bishop, NP      Rollene Rotunda, MD, [facility]  Electronically Signed    KML/MEDQ  D:  04/26/2007  T:  04/27/2007  Job:  [id number]   cc:   Franklyn Lor, MD

## 2011-02-26 NOTE — Assessment & Plan Note (Signed)
[facility]                            CARDIOLOGY OFFICE NOTE   NAME:Dancy, JAYKOB MINICHIELLO                   MRN:          [id number]  DATE:04/02/2007                            DOB:          01-30-1926    REFERRING PHYSICIAN:  Molly Maduro Day, MD   HISTORY OF PRESENT ILLNESS:  Mr. Bender is a 77 year old gentleman with  atherosclerotic coronary disease for which he underwent drug-eluting  stent placement in the LAD in 2005.  His EF is preserved.   In late February of this year he developed new onset atrial  fibrillation.  We have been treating him with rate control and  anticoagulation.  Anticoagulation is followed by Dr. Morrie Sheldon in [place].  Most recent INR was 1.4.  He has had no bleeding.  He is back to his  usual level of exertion without any dyspnea or untoward fatigue.  He has  been doing yard work that he enjoys so much.   CURRENT MEDICATIONS:  1. Tricor 48 mg daily.  2. Hydrochlorothiazide 25 mg daily.  3. Multivitamin.  4. Aspirin 81 mg daily.  5. Lisinopril 20 mg daily.  6. Norvasc 5 mg daily.  7. Coumadin as directed by Dr. Morrie Sheldon.  8. Calcium 500 mg daily.  9. Bystolic 10 mg daily (Bystolic was started by Dr. Morrie Sheldon to replace      Toprol XL 100 mg daily.  Patient cannot recall the reason for the      switch, but thinks it might have been due to some lightheadedness).   PHYSICAL EXAMINATION:  He is generally well appearing.  In no distress.  Heart rate 93.  Blood pressure 142/70.  Weight of 190 pounds.  He has no jugular venous distention, thyromegaly, or lymphadenopathy.  LUNGS:  Clear to auscultation.  He has a nondisplaced point of maximal cardiac impulse.  There is  irregular irregular rhythm without murmur.  ABDOMEN:  Soft.  Non-distended.  Non-tender.  There is no  hepatosplenomegaly.  Bowel sounds are normal.  EXTREMITIES:  Warm without edema.   Electrocardiogram demonstrated atrial fibrillation with a conductive  complex.  He has a  nonspecific anterior ventricular conduction delay.  This is unchanged from prior.   IMPRESSION/RECOMMENDATIONS:  1. Coronary disease.  Drug-eluting stent in the left anterior      descending.  Doing well off Plavix.  Continue aspirin in addition      to the Coumadin.  Ejection fraction is normal.  2. Atrial fibrillation.  Reasonable rate control.  We will plan on      continuing strategy of rate control and anticoagulation.  To save      him money, we will switch him off the Bystolic, and back to Toprol      XL 50 mg daily.  3. Hypertension.  A bit higher than I would like today.  However, it      was great in March.  We will reassess at next visit.  4. Hypercholesterolemia.  Managed by Dr. Morrie Sheldon.  Goal LDL less than 70.   We will have him follow up with Dr. Antoine Poche in our [place] office  in 4  weeks' time for assessment of his rate control on the new dose of beta  blocker.     Salvadore Farber, MD  Electronically Signed    WED/MedQ  DD: 04/02/2007  DT: 04/02/2007  Job #: [id number]

## 2011-02-26 NOTE — Discharge Summary (Signed)
NAMEMIKE, HAMRE NO.:  [id number]   MEDICAL RECORD NO.:  [id number]          PATIENT TYPE:  INP   LOCATION:  4734                         FACILITY:  [facility]   PHYSICIAN:  Joellyn Rued, PA-C     DATE OF BIRTH:  07/05/1926   DATE OF ADMISSION:  05/14/2007  DATE OF DISCHARGE:  05/24/2007                         DISCHARGE SUMMARY - REFERRING   DISCHARGE DIAGNOSES:  1. Dyspnea on exertion secondary to mild congestive heart failure and      atrial fibrillation with rapid ventricular rate.  2. Dilated cardiomyopathy. Coronary artery disease is not in      proportion to degree of left ventricular dysfunction.  3. Atrial fibrillation status post cardioversion, maintaining normal      sinus rhythm at the time of discharge. Anticoagulation with      Coumadin followed by Dr. Christell Constant. Amiodarone initiation.  4. Hypertension, history as previously.   PROCEDURES:  1. Cardiac catheterization on May 22, 2007, by Dr. Riley Kill.  2. DC cardioversion on May 22, 2007,  by Dr. Myrtis Ser   ADMITTING PHYSICIAN:  Arturo Morton. Riley Kill, MD, [facility].   DISCHARGING PHYSICIAN:  Madolyn Frieze. Jens Som, MD,   BRIEF HISTORY:  Mr. Gilliam is a 77 year old male who presented with heart  failure symptoms and chest discomfort.  Imdur was then added to his  medical therapy and unfortunately has caused headaches and was stopped 3  days after initiation.  However, the preceding 5-6 days prior to his  admission he has now developed progressive dyspnea with minimal normal  activity and chest discomfort with rest. On the evening prior to  admission, he was orthopneic`. Thus his presentation.   He was recently admitted with atrial fibrillation with a rapid  ventricular rate.  He is on chronic Coumadin anticoagulation regulated  by Dr. Christell Constant. He has a history of hypertension, hyperlipidemia, gout,  osteoarthritis and known coronary artery disease.  Last catheterization  in March 2005 showed diffuse nonobstructive  coronary artery disease.  EF  was 55%.  He has undergone drug-eluting stent in the past.   LABORATORY:  Chest x-ray on July 31  showed cardiomegaly, no acute findings. On  August 5, he had chronic lung markings, no active processes.  On August  6, no active disease.   EKGs during this hospitalization showed atrial fibrillation with a left  bundle branch block, left axis deviation.  EKG on August 8 showed sinus  rhythm, incomplete left bundle branch block.   On admission, hemoglobin 15.7, hematocrit  47.3, normal indices,  platelets 240, WBC 7.2.  Prior to discharge on August 10, hemoglobin  13.6, hematocrit  40.6, normal indices, platelets 185, WBC 6.3. On  admission, D-dimer was less than 0.22. INR was 2.1 with a PT of 24.4.  On August 3, INR was 1.6, PT 19.4.  Prior to discharge on August 10, INR  was 2.4, PT 26.7.  Admission I-STAT showed a sodium of 142, potassium  3.8, BUN 19, creatinine 1.4, glucose 132.  On August 1, potassium 3.4.  LFTs were within normal limits.  On August 6, his last chemistry showed  a total sodium of 143, potassium 3.9, BUN 15, creatinine 1.3, glucose  119.  CK-MBs, relative indices and troponins were within normal limits  x3.  BNP on admission was 853.  Fasting lipids on August 1 showed a  total cholesterol 195, triglycerides 302, HDL 25, LDL 110.  TSH was  3.968.  Blood cultures obtained on August 8:  Preliminary results did  not reveal any growth.   HOSPITAL COURSE:  Ward Givens and Dr. Daleen Squibb admitted the patient to [facility]  for further treatment of possible heart failure and unstable angina.  Overnight he did not have any further chest discomfort.  He remained  dyspneic and was complaining of a headache. Enzymes and EKGs were  negative for myocardial infarction.  His atrial fibrillation rate  remained controlled. Nitroglycerin paste was discontinued secondary to  the headache. Coumadin was placed on hold in anticipation of cardiac  catheterization.   Pharmacy assisted with management.   Heart catheterization was performed on May 18, 2007, by Dr. Riley Kill.  His EF was approximately 15% with mild MR, severe global hypokinesis,  and a 90% diagonal lesion, 40% LAD lesion with less than 20% in-stent  restenosis, 40-50% distal LAD, 50% diagonal-2, 40-50% circumflex, 70% OM-  1, 30% ostial RCA, 20-30% proximal RCA. Felt that he had nonobstructive  coronary disease and to continue medical treatment.  It was felt that  the diagonal lesion was very small and very tight. Post catheterization,  the patient was doing well, continued dyspnea on exertion.   EP consult was obtained with Loura Pardon and Dr. Lewayne Bunting given his  cardiomyopathy and atrial fibrillation. The patient was started on  amiodarone with scheduled DC cardioversion since his INR had been  therapeutic. Dr. Ladona Ridgel was not clear if the rapid rate associated with  atrial fibrillation was a tachycardia-induced cardiomyopathy or the  atrial fibrillation developed as a consequence of a dilated  cardiomyopathy.  He did feel that the patient needed to maintain normal  sinus rhythm, and amiodarone will be added at least in the short term.  He felt that, if his EF remained down, the he would recommend ICD plus  or minus CRT and would repeat echocardiogram in 2-3 months.  Dr. Myrtis Ser  performed cardioversion, restoring normal sinus rhythm. Dr. Ladona Ridgel made  recommendations, amiodarone 400 mg b.i.d. for 7 days, then 400 mg daily.  He agreed with change of metoprolol to Coreg and optimized dose. Felt  that he should remain in the hospital and until his INR was therapeutic.  On August 9, Dr. Ladona Ridgel felt the patient could be discharged home on  August 10 if INR was greater than 2. On May 24, 2007, INR was 2.4.  The patient felt much better without any chest discomfort or shortness  of breath.  Thus Dr. Jens Som felt the patient could be discharged home.   DISPOSITION:  The patient is  discharged home.  He is asked to maintain a  low-sodium, heart-healthy diet.  Activities are not restricted.  Wound  care in regards to his catheterization sheet is per supplemental  discharge sheet.   His new medications at the time of discharge include:  1. Amiodarone 200 mg 2 tablets b.i.d. for 5 days, then 2 tablets      daily.  2. Coreg 12.5 mg b.i.d.  3. He was asked to discontinue Toprol XL  Other medications include  1. Tricor 48 mg daily.  2. Aspirin 81 mg daily.  3. Hydrochlorothiazide 25 mg daily.  4. Lisinopril 20 mg daily.  5. Tramadol 50 mg daily.  6. Allopurinol 100 mg daily.  7. His Coumadin was decreased to 5 mg, 2.5 mg, 2.5 mg with a      reoccurring pattern daily.  8. Nitroglycerin 0.4 p.r.n.   He will need a PT/INR on Wednesday and was asked to arrange this with  Dr. Christell Constant. Dr. Jens Som would like him to have a chest x-ray and LFTs  and TSH as an outpatient prior to being seen by Dr. Antoine Poche in [place].  He will also need a echocardiogram in 3 months.  He was also asked to  call Dr. Ladona Ridgel to arrange a 3 to 4-week followup  appointment given his atrial fibrillation.  To follow up with Dr. Christell Constant  as needed. When he comes these appointments, he was asked to bring all  medications and weights to all appointments.   Discharge time:  45 minutes.      Joellyn Rued, PA-C     EW/MEDQ  D:  05/24/2007  T:  05/24/2007  Job:  [id number]   cc:   Rollene Rotunda, MD, Emelda Fear. Ladona Ridgel, MD  Ernestina Penna, M.D.

## 2011-02-26 NOTE — Consult Note (Signed)
VASCULAR SURGERY CONSULTATION   ARSH, FEUTZ  DOB:  18-Sep-1926                                       05/03/2008  EAVWU#:[id number]   I saw the patient in the office today in consultation concerning  bilateral lower extremity pain.  He was referred by Dr. Elana Alm.  This  is a pleasant 77 year old gentleman who states that he is been having  some burning and stinging pain in both lower extremities for the last 2  years.  He also states that he feels hot all over.  I do not get any  clear-cut history of calf, thigh or hip claudication.  He denies any  history of rest pain.  He has had no history of nonhealing wounds.  There are really no aggravating or alleviating factors associated with  the burning pain.  He states his symptoms have been stable over the last  6 months.   PAST MEDICAL HISTORY:  Significant for hypertension and  hypercholesterolemia.  In addition, he has coronary artery disease.  He  underwent PTCA at [facility] 3 to 4 years ago.  He does not remember the  details of this.  He has also had problems with arrhythmia in August  2008 but has had no further problems.  He denies any history of diabetes  or history of COPD.   FAMILY HISTORY:  His mother died at age of 24.  Father died at age of  6.  He is unaware of any history of premature cardiovascular disease.   SOCIAL HISTORY:  He is widowed.  He has 1 child.  He is retired.  He  does not use tobacco.   REVIEW OF SYSTEMS AND MEDICATIONS:  Documented on the medical history  form in his chart.   PHYSICAL EXAMINATION:  This is a pleasant 77 year old gentleman who  appears his stated age.  Blood pressure is 153/69, heart rate is 55.  Neck is supple.  There is no cervical lymphadenopathy.  I do not detect  any carotid bruits.  Lungs are clear bilaterally to auscultation.  On  cardiac exam, he has a regular rate and rhythm.  Abdomen:  Soft and  nontender.  I cannot palpate an aneurysm.  He has  palpable femoral  pulses and palpable dorsalis pedis pulses bilaterally.  He has a left  posterior tibial pulse.  I cannot palpate a right posterior tibial  pulse.  In the right foot, he has a biphasic dorsalis pedis signal and a  monophasic posterior tibial signal.  On the left foot, he has a  monophasic dorsalis pedis signal and a biphasic posterior tibial signal.  Both feet are warm and well-perfused without ischemic ulcers.  He has no  significant lower extremity swelling.  He has no significant  varicosities.   I reassured the patient I did not think his symptoms were related to  peripheral vascular disease.  He has some evidence of tibial occlusive  disease but this is mild and he has warm, well-perfused feet.  Likewise,  I do not think he has evidence of any significant venous disease.  His  pain could potentially be neuropathic.  However, he is not diabetic and  he is having symptoms essentially all over, not just in his legs.   Again, I have reassured him that I do not think the symptoms are related  to  arterial insufficiency or significant venous disease.  I would be  happy to see him back at any time if any new vascular issues arise.   Di Kindle. Edilia Bo, M.D.  Electronically Signed  CSD/MEDQ  D:  05/03/2008  T:  05/04/2008  Job:  [id number]   cc:   S. Kyra Manges, M.D.

## 2011-02-26 NOTE — Assessment & Plan Note (Signed)
[facility]                            CARDIOLOGY OFFICE NOTE   NAME:[name], [name]                   MRN:          [id number]  DATE:11/23/2008                            DOB:          Nov 17, 1925    PRIMARY CARE PHYSICIAN:  Bennie Pierini, N.P.   REASON FOR PRESENTATION:  Evaluate the patient with cardiomyopathy and  atrial fibrillation.   HISTORY OF PRESENT ILLNESS:  The patient is a pleasant 77 year old.  He  presents for followup of the above.  He has done well since I last saw  him.  He is having his thyroid followed.  He is having liver enzymes  which is slightly elevated, followed.  This is being done by pharmacist  at [facility].  He said his eyes checked, on the amiodarone.  He  does not have any chest pressure, neck or arm discomfort.  He is not  having any palpitations, presyncope, or syncope.  He is having no PND or  orthopnea.  He is still active working at [employer].   PAST MEDICAL HISTORY:  Dilated cardiomyopathy (felt to be secondary to  atrial fibrillation.  His EF is about 40%), persistent atrial  fibrillation treated with a amiodarone and cardioversion, chronic  Coumadin therapy, coronary artery disease (last catheterization in  August 2008 demonstrated the LAD 40% stenosis).  There was stent in the  LAD which had 20% in-stent restenosis.  This was followed by 40 to 50%  distal stenosis.  There was a 50% diagonal stenosis.  There was 40% to  50% circumflex stenosis.  There was 70% OM1 stenosis and 30% ostial  right coronary artery stenosis.  There was 30% proximal right coronary  artery stenosis), hypertension, hypothyroidism (possibly related to  amiodarone), and dyslipidemia.   ALLERGIES:  PENICILLIN.   MEDICATIONS:  1. Fish oil.  2. Levothyroxine 125 mcg daily.  3. Pravastatin 40 mg daily.  4. Fenofibrate 54 mg daily.  5. Amlodipine 10 mg daily.  6. Coumadin.  7. Multivitamin.  8.  Hydrochlorothiazide 25 mg daily.  9. Calcium.  10.Carvedilol 12.5 mg b.i.d.  11.Amiodarone 200 mg daily.  12.Lisinopril 40 mg daily.   REVIEW OF SYSTEMS:  As stated in the HPI, otherwise negative for all  other systems.   PHYSICAL EXAMINATION:  GENERAL:  The patient is in no distress.  VITAL SIGNS:  Blood pressure 128/62, heart rate 53 and regular, weight  190 pounds, body mass index 31.  HEENT:  Eyes unremarkable; pupils equal, round, and reactive to light;  fundi not visualized.  Oral mucosa unremarkable.  NECK:  No jugular venous distention at 45 degrees, carotid upstroke  brisk and symmetric, no bruits, no thyromegaly.  LYMPHATICS:  No cervical, axillary, or inguinal adenopathy.  LUNGS:  Clear to auscultation bilaterally.  BACK:  No costovertebral angle tenderness.  CHEST:  Unremarkable.  HEART:  PMI not displaced or sustained.  S1 and S2 within normal limits.  No S3, no S4.  No clicks, no rubs, no murmurs.  ABDOMEN:  Flat, positive bowel sounds.  Normal in frequency and pitch.  No bruits, no rebound, no  guarding, no midline pulsatile mass.  No  hepatomegaly, splenomegaly.  SKIN:  No rashes.  No nodules.  EXTREMITIES:  Pulses 2+ throughout.  No edema.  No cyanosis.  No  clubbing.  NEURO:  Oriented to person, place, and time.  Cranial nerves II through  XII grossly intact.  Motor grossly intact.   ASSESSMENT AND PLAN:  1. Atrial fibrillation.  The patient is in sinus rhythm.  He is      tolerating amiodarone.  At this point, he will continue with the      Coumadin and meds as listed.  2. Cardiomyopathy.  Ejection fraction improved from 15% to 40 %.  His      has class I symptoms.  He will continue on the meds as listed.  3. Hypertension.  Blood pressure is well controlled.  He will continue      the meds as listed.  4. Hypothyroidism.  He recently had his Synthroid adjusted as his TSH      is still quite elevated.  This is being followed at [facility].  5. Amiodarone therapy.  His liver enzymes are elevated, but these are      slight and are being followed.  He has had his eyes checked.  He is      having his thyroid followed.  I will get chest x-ray.  6. Dyslipidemia, per Ignacia Bayley.  I did put him on pravastatin      instead of Zocor as he was having some muscle aches though this is      still a problem when he wakes up in the morning.  He remains on      TriCor as well.  He is followed again in the [facility] [place] [facility].  7. Followup.  I will see him in 6 months or sooner as needed.     Rollene Rotunda, MD, [facility]  Electronically Signed    JH/MedQ  DD: 11/23/2008  DT: 11/23/2008  Job #: [id number]   cc:   Bennie Pierini, [place].P.

## 2011-03-01 NOTE — Assessment & Plan Note (Signed)
[facility]                            CARDIOLOGY OFFICE NOTE   NAME:Matsumoto, GARY GABRIELSEN                   MRN:          [id number]  DATE:12/31/2006                            DOB:          09-06-1926    Primary care physician is Alfredia Client, MD, in [place].   HISTORY OF PRESENT ILLNESS:  Mr. Ivey is a 77 year old gentleman with  atherosclerotic coronary disease, for which he underwent stenting of the  LAD in 2005 using a drug-eluting stent.  Ejection fraction is preserved.   He presented 3 weeks ago with new-onset atrial fibrillation.  Rate was  fairly well-controlled.  We titrated his beta blocker and initiated  Coumadin.  He has been tolerating the Coumadin without difficulty.  He  has had no bleeding.  He has gotten back to his normal level of exertion  and has been doing well.  He is eager to begin the yard work that he  enjoys so much.   CURRENT MEDICATIONS:  1. Calcium 500 mg daily.  2. Tricor 48 mg daily.  3. Hydrochlorothiazide 25 mg daily.  4. Multivitamin.  5. Aspirin 81 mg daily.  6. Toprol XL 100 mg daily.  7. Lisinopril 20 mg daily.  8. Norvasc 5 mg daily.  9. Coumadin as directed by our Coumadin clinic.   PHYSICAL EXAMINATION:  GENERAL:  He is generally well-appearing, in no  acute distress.  VITAL SIGNS:  Heart rate 88, blood pressure 114/61, and weight of 189  pounds.  NECK:  He has no jugular venous distention, thyromegaly or  lymphadenopathy.  LUNGS:  Clear to auscultation.  CARDIAC:  He has a nondisplaced point of maximum cardiac impulse.  There  is an irregularly irregular rhythm without murmur.  ABDOMEN:  Soft, nondistended, nontender.  There is no  hepatosplenomegaly.  Bowel sounds are normal.  EXTREMITIES:  Warm and without edema.   EKG demonstrates atrial fibrillation with frequent premature ventricular  contractions.  He has a nonspecific interventricular conduction delay.   Echocardiogram performed December 23, 2006, demonstrates EF of 55% with  septal dyssynergy felt related to his interventricular conduction delay.  He had mild mitral annular calcification without regurgitation.   IMPRESSION/RECOMMENDATIONS:  1. Coronary disease, drug-eluting stent in the left anterior      descending artery, doing well off Plavix since 2006:  Continue the      aspirin.  2. Atrial fibrillation:  Rate nicely controlled.  Continue strategy of      rate control and anticoagulation.  3. Hypertension:  Excellent control.  4. Hypercholesterolemia:  Managed by Dr. Morrie Sheldon.  Goal LDL less than 70.      I have asked him to return to his activity without limitation      except care for things that might cause bleeding.  He will follow      up in 3 months' time.     Salvadore Farber, MD  Electronically Signed    WED/MedQ  DD: 12/31/2006  DT: 12/31/2006  Job #: [id number]   cc:   Alfredia Client, MD

## 2011-03-01 NOTE — Cardiovascular Report (Signed)
[name]                          ACCOUNT NO.:  [id number]   MEDICAL RECORD NO.:  [id number]                   PATIENT TYPE:  INP   LOCATION:  6599                                 FACILITY:  [facility]   PHYSICIAN:  Veneda Melter, M.D.                   DATE OF BIRTH:  Jan 25, 1926   DATE OF PROCEDURE:  12/14/2003  DATE OF DISCHARGE:                              CARDIAC CATHETERIZATION   PROCEDURES PERFORMED:  1. Left heart catheterization.  2. Left ventriculogram.  3. Selective coronary angiography.  4. Percutaneous transluminal angioplasty and stent placement, distal left     anterior descending.  5. Angio-Seal, right femoral artery.   DIAGNOSES:  1. Coronary atherosclerotic disease.  2. Normal left ventricular systolic function.  3. Unstable angina.   CARDIOLOGIST:  Veneda Melter, M.D.   HISTORY:  [name] is a 77 year old white male who presents with substernal  chest discomfort and dyspnea on exertion, which has increased in frequency  and severity over the past one year.  The patient presented now with  unstable symptoms to his physician and was found to have T wave inversion in  the anterolateral leads.  He was admitted to the hospital to rule out acute  myocardial infarction and stabilize medically.  He presents now for further  assessment.   TECHNIQUE:  Informed consent was obtained.  The patient was brought to the  catheterization lab.  A 6 French sheath was placed in the right femoral  artery using the modified Seldinger technique.  Six [place] JL-4 and JR-4  catheters were then used to engage the left and right coronary arteries, and  selective angiography performed in various projections using manual  injection of contrast.  A 6 French pigtail catheter was then advanced into  the left ventricle and left ventriculogram performed using power injection  of contrast.   FINDINGS:  Initial findings are as follows.  Left Main Trunk:  The left main trunk is a large  caliber vessel with mild  irregularities.   LAD:  The LAD is a medium caliber vessel that provides a small first  diagonal branch in the proximal segment and a second diagonal branch in the  midsection, and then wraps around the apex to provide the distal inferior  septum.  The LAD has calcification along its entire length with diffuse  disease of 30-40% in the midsection at the takeoff of the second diagonal  branch.  There was then a focal high-grade narrowing of 90% followed by mild  post stenotic dilatation.  The distal LAD then has a moderate narrowing of  40-50% with disease of 30% at the apex.  The first diagonal branch is a  small caliber vessel with diffuse disease of 30%.  The second diagonal  branch has an ostial narrowing of 70%.   Left Circumflex Artery:  The left circumflex artery is a medium caliber  vessel that  provides a large bifurcating first marginal branch in the  midsection and a small second marginal branch distally.  The mid AV  circumflex has moderate narrowing of 50%.  There is a borderline lesion of  60% in the proximal segment of the first marginal branch.   Right Coronary Artery:  The right coronary artery is dominant.  This a small  caliber vessel that provides a small posterior descending artery in its  terminal segment.  The right coronary artery has an ostial narrowing of 50%.  There is then further diffuse disease of 30-40% in the midsection.  The  posterior descending artery is a small vessel that truncates in the mid  inferior wall and has diffuse disease of 30-40%.   LV:  Normal end-systolic and end-diastolic dimensions.  Overall left  ventricular function is well-preserved.  Ejection fraction is greater than  55%.  No mitral regurgitation.  There is hypokinesis of the distal inferior  wall.  LV pressure is 160/10, aortic is 160/80 and LVEDP equals 20.   With these findings we would like to proceed with percutaneous intervention  of the distal  LAD.   The patient was given Integrilin and heparin on a weight-adjusted basis to  maintain an ACT of greater than 250 seconds.  He was also given 600 mg of  Plavix orally. He had been pretreated with aspirin.   A 6 French Q-3.5 guide catheter was used to engage the left coronary artery  and a 0.014 inch _______ were advanced in the mid LAD.  This was used to  size the lesion length and vessel diameter.  The wire was positioned  distally and a 2.5 x 8 mm Quantum Maverick balloon used to predilate the  distal LAD at six atmospheres for 30 seconds.  Repeat angiography showed  only modest improvement of the lumen with severe 70% residual narrowing.  A  2.5 x 12 mm TAXUS Express-2 stent was introduced, carefully positioned in  the distal LAD just distal to the second diagonal branch, extending up to  the area of post stenotic dilatation and deployed at 14 atmospheres for 30  seconds.  A 2.5 x 8 mm Quantum Maverick balloon was then reintroduced and  several inflations performed at up to 18 atmospheres for 30 seconds.  Repeat  angiography showed excellent result with full coverage of the lesion and no  residual stenosis.  There was tapering of the stent as it extended into the  area of post stenotic dilatation and we were no overly aggressive in the  proximal segment due to bifurcation with the second diagonal branch and  residual disease in the mid LAD.   Final angiography was then performed in various projections showing no  distal vessel damage and TIMI III flow through the LAD.  The guide catheter  was then removed as was then sheath and an Angio-Seal closure device  deployed to the right femoral artery.  Adequate hemostasis was achieved.   The patient was transfusion to the floor in stable condition; he tolerated  the procedure well.   FINAL RESULTS:  Successful percutaneous intervention with stent placement to the distal left anterior descending with reduction of 90% narrowing to 0%   with placement of a 2.5 x 12 millimeter TAXUS Express-2 drug-eluting stent.   ASSESSMENT AND PLAN:  [name] is a 77 year old gentleman with severe  single-vessel coronary artery disease and well-preserved left ventricular  function.   Aggressive medical therapy will be pursued with statin therapy as well  as  Plavix for a minimum of six months time.  The remainder of his coronary  disease should be managed medically unless he has symptoms or ischemia.                                               Veneda Melter, M.D.    Melton Alar  D:  12/14/2003  T:  12/15/2003  Job:  [id number]   cc:   Vida Roller, M.D.  Fax: [phone]   Devoria Glassing, M.D.  [place], [place]

## 2011-03-01 NOTE — H&P (Signed)
NAMEMEHAR, KIRKWOOD NO.:  [id number]   MEDICAL RECORD NO.:  [id number]          PATIENT TYPE:  INP   LOCATION:  1824                         FACILITY:  [facility]   PHYSICIAN:  Salvadore Farber, MD  DATE OF BIRTH:  Feb 26, 1926   DATE OF ADMISSION:  12/09/2006  DATE OF DISCHARGE:                              HISTORY & PHYSICAL   CHIEF COMPLAINT:  Weakness.   HISTORY OF PRESENT ILLNESS:  Mr. Zagal is a 77 year old gentleman with  atherosclerotic coronary artery disease and hypertension.  He has felt  slightly weak over the past several weeks and has noted somewhat  decreased exercise tolerance over that period.  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  He has not had  any chest discomfort, orthopnea, PND, edema or palpitations.  With these  complaints, he presented to Dr. Nelly Laurence office today.  He was found to be  in atrial fibrillation with rapid ventricular response.  He was  initiated on IV diltiazem and transferred for further evaluation.   PAST MEDICAL HISTORY:  1. Hypertension.  2. Hypercholesterolemia.  3. Status post left hip surgery due to trauma as a child.  4. Status post colonic polyp removal 1999.  5. Coronary artery disease, status post drug-eluting stent placement      in the distal LAD in 2005 by Dr. Chales Abrahams.   ALLERGIES:  PENICILLIN.   CURRENT MEDICATIONS:  1. Tricor 48 mg daily.  2. Toprol XL 12.5 mg daily.  3. Norvasc 5 mg daily.  4. Aspirin 81 mg daily.  5. HCTZ 25 mg daily.  6. Prinivil 20 mg daily.   SOCIAL HISTORY:  The patient is a retired Comptroller.  He  lives in [place] with his wife.  He has one grown daughter.  He mows  lawns on a regular basis.  He never smoked on a regular basis.  Denies  alcohol use.   FAMILY HISTORY:  Mother died at age 54 due to presumed myocardial  infarction.  Father died of unknown cause at age 39.  Brother died due  to complications of a stroke and diabetes  at age 76.   REVIEW OF SYSTEMS:  Negative in detail except as above with the  exception of some neck pain with motion and left posterior knee pain  treated with arthrocentesis with relief about 3 weeks ago.   PHYSICAL EXAMINATION:  He is generally well-appearing in no distress  with heart rate 102 beats per minute, blood pressure is 178/80.  Oxygen  saturation is 98% on 2 liters by nasal cannula.  He is afebrile.  HEENT:  Notable for old trauma on his left anterior head.  This dates to  childhood and is well healed.  Otherwise normal.  MUSCULOSKELETAL:  Exam is normal.  SKIN:  Exam is normal.  He has no jugular venous distension,  thyromegaly, lymphadenopathy.  RESPIRATORY:  Effort is normal.  LUNGS:  Clear to auscultation.  He is a nondisplaced point maximal  cardiac impulse.  HEART:  He has an irregularly irregular rhythm without murmur or  rub.  ABDOMEN:  Soft, nondistended, nontender.  There is no hepatosplenomegaly  and no midline pulsatile mass.  Bowel sounds are normal.  EXTREMITIES:  Warm without edema.  Carotid pulses 2+ bilaterally without  bruit.  Femoral pulses 2+ bilaterally without bruit.  DP pulses 2+  bilaterally.   LABORATORY STUDIES:  Are pending at this time.  Electrocardiogram  demonstrates atrial fibrillation with ventricular rate of 129 beats per  minute.  There is a left bundle branch block and two premature  ventricular beats.   IMPRESSION/RECOMMENDATIONS:  1. Atherosclerotic coronary disease, asymptomatic:  Continue aspirin      and beta blocker.  2. New onset atrial fibrillation:  This appears to be the culprit for      his mild symptoms.  We will admit him to the hospital and rule out      myocardial infarction.  I think it is very unlikely.  We will check      TSH.  We will increase his beta blocker to metoprolol 25 mg every 8      hours.  We will wean his diltiazem to off and control heart rate      with a ventricular rate less than 100.  I will plan  on checking      echocardiogram as an outpatient.  We will check TSH today.  Given      his [place] score of 2 we will heparinize and begin Coumadin.  3. Hypertension:  Hopefully will come under better control with      increase in the beta blockade.  Continue other medications.      Salvadore Farber, MD  Electronically Signed     WED/MEDQ  D:  12/09/2006  T:  12/09/2006  Job:  [id number]   cc:   Alfredia Client, MD

## 2011-03-01 NOTE — Discharge Summary (Signed)
NAMEJEREMIYAH, [name] NO.:  [id number]   MEDICAL RECORD NO.:  [id number]                   PATIENT TYPE:  INP   LOCATION:  6527                                 FACILITY:  [facility]   PHYSICIAN:  Vida Roller, M.D.                DATE OF BIRTH:  1926/01/20   DATE OF ADMISSION:  12/13/2003  DATE OF DISCHARGE:  12/15/2003                           DISCHARGE SUMMARY - REFERRING   PROCEDURES:  Coronary angiogram/stent, left anterior descending, December 14, 2003.   REASON FOR ADMISSION:  Please refer to dictated admission note.   LABORATORY DATA:  Cardiac enzymes normal.  Lipid profile:  Total cholesterol  178, triglycerides 331, HDL 29, LDL 83 (cholesterol/HDL ratio 6.1).  Normal  TSH.  Potassium 3.4 at discharge.  Glucose 134.  Normal renal function.  Normal CBC at discharge.  CPK 48/1.2 at discharge.   HOSPITAL COURSE:  Following presentation to the emergency room with symptoms  worrisome for unstable angina pectoris, the patient was placed on medication  regimen consisting of aspirin, beta blocker, intravenous nitroglycerin, and  heparin.  Admission electrocardiogram was suggestive of anterior ischemia  and the patient's symptoms were worrisome for crescendo angina.  Recommendation was thus to proceed with coronary angiography.   Serial cardiac markers were negative for myocardial infarction.   Cardiac catheterization, performed by Dr. Veneda Melter (see report for full  details), revealed severe single-vessel coronary artery disease with  preserved left ventricular function.   Specifically, there was a 90% distal LAD lesion with otherwise noncritical  coronary artery disease.  Dr. Chales Abrahams proceeded with successful placement of a  TAXUS drug-eluting stent with reduction of the 90% distal LAD lesion to 0%  residual stenosis.  There were no noted complications.   Recommendation was to treat with Plavix for 6 months.   The patient was kept for overnight  observation, cleared for discharge the  following morning in hemodynamically stable condition.  The patient was  ambulating with no complaint of chest pain.  There were no complications  noted of the right groin.   Mild hypokalemia noted at discharge with supplementation ordered.   MEDICATION ADJUSTMENTS:  Addition of Plavix, Toprol, and Lipitor.  Discontinuation of Norvasc and Maxzide.   MEDICATIONS AT DISCHARGE:  1. Plavix 75 mg daily (x6 months).  2. Coated aspirin 325 mg daily.  3. Toprol-XL 75 mg daily.  4. Tricor 54 mg daily.  5. Lipitor 20 mg daily.  6. Nitrostat 0.4 mg p.r.n.   INSTRUCTIONS:  No heavy lifting/driving x2 days; maintain a low-fat/-  cholesterol diet; call the office if there is any swelling/bleeding in the  groin.   The patient was also instructed to refrain from proceeding with scheduled  MRI for at least 4 weeks.   FOLLOWUP:  The patient is scheduled to follow up with Dr. Veneda Melter on  January 24, 2004 at 2:15 p.m.   DISCHARGE DIAGNOSES:  1. Unstable angina pectoris.     a. Normal serial cardiac markers.     b. Severe single-vessel coronary artery disease:  Status post stent        (TAXUS), 90% distal left anterior descending, December 14, 2003.     c. Preserved left ventricular function.  2. Hypertension.  3. Dyslipidemia.  4. Hypokalemia.  5. Ventricular ectopy.      Gene Serpe, P.A. [facility]                      Vida Roller, M.D.    GS/MEDQ  D:  12/15/2003  T:  12/16/2003  Job:  [id number]

## 2011-03-01 NOTE — Discharge Summary (Signed)
NAMEJUSTO, HENGEL NO.:  [id number]   MEDICAL RECORD NO.:  [id number]          PATIENT TYPE:  INP   LOCATION:  2901                         FACILITY:  [facility]   PHYSICIAN:  Theodore Demark, PA-C   DATE OF BIRTH:  09-Jun-1926   DATE OF ADMISSION:  12/09/2006  DATE OF DISCHARGE:  12/10/2006                               DISCHARGE SUMMARY   PROCEDURES:  None.   DISCHARGE DIAGNOSIS:  Atrial fibrillation with rapid ventricular  response.   SECONDARY DIAGNOSES:  1. Hypertension.  2. Coronary artery disease status post PTCA and Taxus stent to the      distal LAD in 2005 with residual coronary artery disease. RCA 50%.      PDA 30%, circumflex 50%, OM-1 60%, proximal LAD 40%.  3. Preserved left ventricular function with an EF of 55% at      catheterization in 2005.  4. Allergy or intolerance to PENICILLIN.  5. Hypercholesterolemia.  6. Remote history of left hip surgery.  7. History of gastric polyp removal.  8. Family history of cerebrovascular accident.  9. Anticoagulation with Coumadin started this admission.  10.Hyperglycemia with fasting blood sugar of 118. Hemoglobin A1c      incomplete at the time of dictation. Time at discharge 34 minutes.   HOSPITAL COURSE:  Mr. Casale is a 77 year old male with a previous  history of coronary artery disease. He came to the hospital from his  primary care physician's office where he had gone because of increased  weakness and dyspnea on exertion. He was found by Dr. Morrie Sheldon to be in  atrial fibrillation with rapid ventricular response and referred to the  emergency room for admission.   Although Mr. Taul had some complaints of weakness, he had no chest  pain. His cardiac enzymes were negative for MI. Dr. Samule Ohm is to check  an echocardiogram as an outpatient, but no further inpatient workup is  indicated.   Mr. Elvin remained in atrial fibrillation overnight. He had been on a  beta blocker prior to admission. Cardizem was  also used for rate  control, but once a beta blocker was up titrated the Cardizem was no  longer needed and it was discontinued. Prior to admission he was on  Toprol XL 25 mg 1/2 tab daily, and he is to be discharged on Toprol XL  100 mg a day. His lisinopril was cut from 40 to 20 mg a day, and the  Norvasc was decreased from 10 to 5 mg a day. His HCTZ is to continue  unchanged.   On December 10, 2006 Mr. Cowles's heart rate was controlled. He was  ambulating without chest pain or shortness of breath. Her TSH was within  normal limits at 4.375. A lipid profile was performed which showed a  total cholesterol 179, triglycerides 246, HDL 31, LDL 99. He is to  continue Tricor with medication adjustments to be made p.r.n. as an  outpatient. His renal function was within normal limits with a BUN 11  and creatinine 1.19. His potassium was stable at 3.5. His CBC was within  normal  limits with a hemoglobin of 13.8, hematocrit 39.8, platelets 210,  white count 6.6. His INR at discharge was 1.2 after one dose of Coumadin  5 mg.   On December 10, 2006 Mr. Grima was evaluated by Dr. Samule Ohm and  considered stable for discharge with outpatient follow-up arranged.   DISCHARGE INSTRUCTIONS:  His activity level is to be increased  gradually. He is to call our office for weakness or palpitations. He is  to follow up with Dr. Morrie Sheldon as needed. He is to follow up with Dr. Samule Ohm  on March 19 at 12:15. He is to stick to a low sodium, heart healthy  diet. He has a Coumadin clinic appointment on March 3 at 2:15 and is to  get an echocardiogram on March 11 at 1 p.m.   DISCHARGE MEDICATIONS:  1. Toprol XL 100 mg a day.  2. Coumadin 5 mg a day or as directed.  3. Tricor 48 mg a day.  4. Aspirin 81 mg a day.  5. HCTZ 25 mg a day.  6. Vitamins as prior to admission.  7. Lisinopril 40 mg 1/2 tab daily.  8. Norvasc 10 mg 1/2 tab daily.      Theodore Demark, PA-C     RB/MEDQ  D:  12/10/2006  T:  12/10/2006   Job:  [id number]   cc:   Day, M.D.

## 2011-03-01 NOTE — H&P (Signed)
NAME:  [name], DOREN NO.:  [id number]   MEDICAL RECORD NO.:  [id number]                   PATIENT TYPE:  INP   LOCATION:  1831                                 FACILITY:  [facility]   PHYSICIAN:  Vida Roller, M.D.                DATE OF BIRTH:  [birth year]   DATE OF ADMISSION:  12/13/2003  DATE OF DISCHARGE:                                HISTORY & PHYSICAL   REASON FOR ADMISSION:  The patient is a very pleasant 77 year old male, with  no previous cardiac history, but with cardiac risk factors notable for  hypertension,  hypercholesterolemia and age, who presents to the emergency  room with a several-month-history of progressive exertional dyspnea, and  more recently, exertional chest discomfort relieved with rest.  The patient states that over the past one or two years, he has noted some  dyspnea with exertion which seems to have gotten worse over the past few  months; however, he denies any symptoms of orthopnea, PND, or pedal edema.  He typically does not have exertional dyspnea when walking on level ground,  but definitely has pronounced dyspnea when walking up-hill.  Additionally,  over the past week, he has noted some anterior chest tightness precipitated  by strenuous activity or exertion, but relieved with rest.  While attempting to sweep off some snow from his front steps yesterday, he  experienced his most pronounced episode of anterior chest tightness within  less than five minutes.  He did not experience any radiation to the jaw or  upper extremities, nor any associated diaphoresis or nausea/vomiting.  After  he rested, he noted a resolution of his symptoms in approximately five  minutes.  The patient presented to his primary care physician today, to discuss what  happened yesterday.  He has not had any recurrent chest symptoms.  An  electrocardiogram today showed new T-wave inversion in leads V3 and V5,  compared to previous  electrocardiograms.  He was thus referred to the  emergency room for further evaluation.   ALLERGIES:  PENICILLIN.   MEDICATIONS PRIOR TO ADMISSION:  1. Tri-Cor 54 mg daily.  2. Norvasc 5 mg daily.  3. Maxzide 25/37.5 mg daily.   PAST MEDICAL HISTORY:  1. Hypertension.  2. Hypercholesterolemia.  3. Status post gastric polyp removal approximately six years ago.  4. Status post left hip surgery, secondary to trauma (age 29).   SOCIAL HISTORY:  The patient lives in [place] with his wife.  He has one  grown daughter.  He is a retired Administrator, and since 1985,  has been mowing lawns.  He has never smoked on a regular basis.  He denies  alcohol use.   FAMILY HISTORY:  Mother deceased at age 64, presumably secondary to a  myocardial infarction.  Father deceased at age 89, unknown etiology.  A  brother deceased at age 60, secondary to complications of a stroke  and  diabetes mellitus.   REVIEW OF SYSTEMS:  The patient denies any previous history of a myocardial  infarction, congestive heart failure, syncope, or a stroke.  Denies any  symptoms of gastroesophageal reflux disease or a history of peptic ulcer  disease.  He denies any evidence of upper or lower GI bleeding.  The  remaining systems are negative.   PHYSICAL EXAMINATION:  VITAL SIGNS:  Blood pressure 201/74 on admission,  temperature 97.8 degrees, pulse 78 and irregular, respirations 18.  CO2 of  98% on room air.  GENERAL:  A 77 year old male in no apparent distress.  HEENT:  Normocephalic, atraumatic.  NECK:  Preserved bilateral carotid pulses without bruits.  LUNGS:  Clear to auscultation in all fields.  HEART:  A regular rate and rhythm.  S1 and S2.  No murmurs, rubs, or  gallops.  ABDOMEN:  Protuberant, nontender.  Intact bowel sounds.  EXTREMITIES:  Preserved bilateral femoral pulses without bruits.  Intact  distal pulses with no pedal edema.  NEUROLOGIC:  No focal deficits.   IMPRESSION:  1.  Crescendo angina pectoris.  2. Exertional dyspnea     a. Question anginal equivalent.  3. Multiple cardiac risk factors.  a  Hypertension.  b  Hypercholesterolemia.  c.  Age.  1. Frequent ventricular ectopy  a  Previously documented.   PLAN:  The patient will be admitted to the step-down unit for aggressive  management of symptoms suggestive of unstable angina pectoris.  He has been  given four baby aspirin here in the emergency room, which will be followed  by three doses of 5 mg IV Lopressor.  We will also place him on intravenous  nitroglycerin to assist in the management of his uncontrolled hypertension.  He will be started on IV heparin, and if his cardiac markers are abnormal,  be started on Integrilin as well.  Regarding his current medication regimen,  he will be taken off of both Norvasc and Maxzide.  We will continue Tri-  Chlor and check a fasting lipid profile in the morning.  He will be started  on Lopressor 25 mg q.8h.  The recommendation is to proceed with a diagnostic  coronary angiography in the morning, or sooner if the patient develops  worsening symptoms.  He is agreeable with this plan, and the risks/benefits  have been discussed.  In addition to routine labs, we will check a TSH  level, and also add a magnesium level, given the frequent ectopy.      Gene Serpe, P.A. [facility]                      Vida Roller, M.D.    GS/MEDQ  D:  12/13/2003  T:  12/13/2003  Job:  [id number]   cc:   Dr. Dionicia [facility]

## 2011-03-12 ENCOUNTER — Other Ambulatory Visit: Payer: Self-pay | Admitting: Cardiology

## 2011-04-15 ENCOUNTER — Other Ambulatory Visit: Payer: Self-pay | Admitting: Cardiology

## 2011-05-22 ENCOUNTER — Encounter: Payer: Self-pay | Admitting: Cardiology

## 2011-07-29 LAB — CBC
HCT: 45.8
HCT: 46.5
Hemoglobin: 14.4
Hemoglobin: 14.9
Hemoglobin: 15.2
Hemoglobin: 15.8
MCHC: 33.3
MCHC: 33.3
MCHC: 33.4
MCHC: 33.4
MCHC: 33.6
MCHC: 33.6
MCHC: 33.9
MCHC: 34
MCHC: 33.1
MCV: 79.1
MCV: 80.2
Platelets: 183
Platelets: 185
Platelets: 223
Platelets: 238
Platelets: 240
RBC: 5.02
RBC: 5.24
RBC: 5.66
RBC: 5.69
RBC: 5.89 — ABNORMAL HIGH
RDW: 15.4 — ABNORMAL HIGH
RDW: 15.6 — ABNORMAL HIGH
RDW: 15.7 — ABNORMAL HIGH
RDW: 15.7 — ABNORMAL HIGH
RDW: 15.8 — ABNORMAL HIGH
RDW: 15.9 — ABNORMAL HIGH
RDW: 15.9 — ABNORMAL HIGH
RDW: 15.3 — ABNORMAL HIGH
WBC: 6.2
WBC: 7.4

## 2011-07-29 LAB — PROTIME-INR
INR: 1.6 — ABNORMAL HIGH
INR: 2.1 — ABNORMAL HIGH
INR: 2.1 — ABNORMAL HIGH
INR: 2.1 — ABNORMAL HIGH
Prothrombin Time: 14.7
Prothrombin Time: 14.9
Prothrombin Time: 15.8 — ABNORMAL HIGH
Prothrombin Time: 15.9 — ABNORMAL HIGH
Prothrombin Time: 17.3 — ABNORMAL HIGH
Prothrombin Time: 19.4 — ABNORMAL HIGH
Prothrombin Time: 21.3 — ABNORMAL HIGH
Prothrombin Time: 24.1 — ABNORMAL HIGH
Prothrombin Time: 24.1 — ABNORMAL HIGH
Prothrombin Time: 25.4 — ABNORMAL HIGH
Prothrombin Time: 26.7 — ABNORMAL HIGH

## 2011-07-29 LAB — BASIC METABOLIC PANEL
CO2: 24
CO2: 27
Calcium: 9
Calcium: 9.3
Creatinine, Ser: 1.07
Creatinine, Ser: 1.3
GFR calc Af Amer: >60
GFR calc Af Amer: >60
GFR calc non Af Amer: >60
GFR calc non Af Amer: >60
Glucose, Bld: 119 — ABNORMAL HIGH
Potassium: 3.9
Sodium: 142

## 2011-07-29 LAB — POCT I-STAT CREATININE: Operator id: 235261

## 2011-07-29 LAB — COMPREHENSIVE METABOLIC PANEL WITH GFR
ALT: 19
AST: 19
Albumin: 3.9
Alkaline Phosphatase: 47
BUN: 17
CO2: 25
Calcium: 9.3
Chloride: 105
Creatinine, Ser: 1.16
GFR calc non Af Amer: >60
Glucose, Bld: 133 — ABNORMAL HIGH
Potassium: 3.4 — ABNORMAL LOW
Sodium: 138
Total Bilirubin: 1.1
Total Protein: 6.9

## 2011-07-29 LAB — I-STAT 8, (EC8 V) (CONVERTED LAB)
Acid-base deficit: 2
Glucose, Bld: 132 — ABNORMAL HIGH
TCO2: 23
pCO2, Ven: 35.1 — ABNORMAL LOW
pH, Ven: 7.407 — ABNORMAL HIGH

## 2011-07-29 LAB — DIFFERENTIAL
Basophils Absolute: 0
Basophils Relative: 0
Eosinophils Absolute: 0.1
Neutro Abs: 4.8
Neutrophils Relative %: 66

## 2011-07-29 LAB — HEPARIN LEVEL (UNFRACTIONATED)
Heparin Unfractionated: 0.12 — ABNORMAL LOW
Heparin Unfractionated: 0.19 — ABNORMAL LOW
Heparin Unfractionated: 0.27 — ABNORMAL LOW
Heparin Unfractionated: 0.41
Heparin Unfractionated: 0.41
Heparin Unfractionated: 0.52
Heparin Unfractionated: 0.54

## 2011-07-29 LAB — CULTURE, BLOOD (ROUTINE X 2)
Culture: NO GROWTH
Culture: NO GROWTH

## 2011-07-29 LAB — POCT CARDIAC MARKERS
Myoglobin, poc: 70.1
Operator id: 235261
Troponin i, poc: <0.05

## 2011-07-29 LAB — B-NATRIURETIC PEPTIDE (CONVERTED LAB): Pro B Natriuretic peptide (BNP): 853 — ABNORMAL HIGH

## 2011-07-29 LAB — CARDIAC PANEL(CRET KIN+CKTOT+MB+TROPI)
CK, MB: 1.1
CK, MB: 1.1
Relative Index: INVALID
Total CK: 36
Total CK: 40
Troponin I: 0.01

## 2011-07-29 LAB — CK TOTAL AND CKMB (NOT AT ARMC): Relative Index: INVALID

## 2011-07-29 LAB — TROPONIN I: Troponin I: 0.02

## 2011-07-29 LAB — TSH: TSH: 3.968

## 2011-07-29 LAB — APTT: aPTT: 39 — ABNORMAL HIGH

## 2011-07-29 LAB — D-DIMER, QUANTITATIVE: D-Dimer, Quant: <0.22

## 2011-07-29 LAB — LIPID PANEL: Cholesterol: 195

## 2011-07-30 LAB — BASIC METABOLIC PANEL
BUN: 21
CO2: 22
Calcium: 8.8
GFR calc non Af Amer: 57 — ABNORMAL LOW
Glucose, Bld: 209 — ABNORMAL HIGH
Sodium: 139

## 2011-07-30 LAB — I-STAT 8, (EC8 V) (CONVERTED LAB)
Acid-base deficit: 2
Bicarbonate: 21.7
HCT: 50
Hemoglobin: 17
Operator id: 284141
Sodium: 141
TCO2: 23
pCO2, Ven: 32.7 — ABNORMAL LOW

## 2011-07-30 LAB — CULTURE, BLOOD (ROUTINE X 2): Culture: NO GROWTH

## 2011-07-30 LAB — CBC
HCT: 47.4
Hemoglobin: 14.9
MCHC: 33
Platelets: 232
Platelets: 252
RDW: 14.9 — ABNORMAL HIGH
RDW: 15.3 — ABNORMAL HIGH
WBC: 7.6

## 2011-07-30 LAB — CARDIAC PANEL(CRET KIN+CKTOT+MB+TROPI)
CK, MB: 1.4
CK, MB: 1.6
Relative Index: INVALID
Total CK: 44
Troponin I: 0.02
Troponin I: 0.02

## 2011-07-30 LAB — B-NATRIURETIC PEPTIDE (CONVERTED LAB): Pro B Natriuretic peptide (BNP): 55

## 2011-07-30 LAB — DIFFERENTIAL
Basophils Absolute: 0
Eosinophils Absolute: 0.1
Eosinophils Relative: 2
Lymphocytes Relative: 22
Lymphs Abs: 1.5
Monocytes Absolute: 0.3

## 2011-07-30 LAB — LIPID PANEL
Cholesterol: 171
LDL Cholesterol: 104 — ABNORMAL HIGH
Total CHOL/HDL Ratio: 6.6
Triglycerides: 203 — ABNORMAL HIGH
VLDL: 41 — ABNORMAL HIGH

## 2011-07-30 LAB — PROTIME-INR: INR: 2.4 — ABNORMAL HIGH

## 2011-07-30 LAB — POCT CARDIAC MARKERS
Operator id: 284141
Troponin i, poc: <0.05

## 2011-07-30 LAB — TSH: TSH: 2.016

## 2011-07-30 LAB — CK TOTAL AND CKMB (NOT AT ARMC): Total CK: 38

## 2011-09-02 ENCOUNTER — Other Ambulatory Visit: Payer: Self-pay | Admitting: Cardiology

## 2011-10-07 ENCOUNTER — Other Ambulatory Visit: Payer: Self-pay | Admitting: Cardiology

## 2011-11-18 ENCOUNTER — Other Ambulatory Visit: Payer: Self-pay | Admitting: Cardiology

## 2011-11-20 ENCOUNTER — Encounter: Payer: Self-pay | Admitting: Cardiology

## 2011-11-20 ENCOUNTER — Ambulatory Visit (INDEPENDENT_AMBULATORY_CARE_PROVIDER_SITE_OTHER): Payer: Medicare Other | Admitting: Cardiology

## 2011-11-20 DIAGNOSIS — I2510 Atherosclerotic heart disease of native coronary artery without angina pectoris: Secondary | ICD-10-CM

## 2011-11-20 DIAGNOSIS — R0989 Other specified symptoms and signs involving the circulatory and respiratory systems: Secondary | ICD-10-CM | POA: Insufficient documentation

## 2011-11-20 DIAGNOSIS — R202 Paresthesia of skin: Secondary | ICD-10-CM | POA: Insufficient documentation

## 2011-11-20 DIAGNOSIS — I4891 Unspecified atrial fibrillation: Secondary | ICD-10-CM

## 2011-11-20 DIAGNOSIS — E785 Hyperlipidemia, unspecified: Secondary | ICD-10-CM

## 2011-11-20 DIAGNOSIS — R209 Unspecified disturbances of skin sensation: Secondary | ICD-10-CM

## 2011-11-20 NOTE — Assessment & Plan Note (Signed)
The blood pressure is at target. No change in medications is indicated. We will continue with therapeutic lifestyle changes (TLC).  

## 2011-11-20 NOTE — Patient Instructions (Addendum)

## 2011-11-20 NOTE — Assessment & Plan Note (Signed)
He's not having any symptoms of this. He tolerates anticoagulation. He does not consider the newer agents as he does well on warfarin. I do not see a recent TSH that he has had recent liver enzymes. I will order the TSH. Of note I think is unlikely that amiodarone is contributing to his "tingling".  However, I would consider discontinuing as if there is no other neurologic etiology identified.

## 2011-11-20 NOTE — Assessment & Plan Note (Signed)
The patient has no new sypmtoms.  No further cardiovascular testing is indicated.  We will continue with aggressive risk reduction and meds as listed.  

## 2011-11-20 NOTE — Assessment & Plan Note (Signed)
I reviewed his recent lipids. His LDL was 64 but his HDL is 28. He has had his meds changed recently and this is being actively managed by Dr. Modesto Charon.  I will defer to his management.

## 2011-11-20 NOTE — Progress Notes (Signed)
HPI The patient presents for one-year followup of his known coronary disease. Since I last saw him he has done well. He is complaining of a tingling burning discomfort that seems to happen only at night.  He is not describing palpitations, presyncope or syncope. He is not having any chest pressure, neck or arm discomfort. He has no shortness of breath, PND or orthopnea. He's not exercising as much his leg but he remains active with activities of daily living. He's having none of the symptoms that he had prior to his angioplasties.  Allergies  Allergen Reactions  . Penicillins     Current Outpatient Prescriptions  Medication Sig Dispense Refill  . amiodarone (PACERONE) 200 MG tablet TAKE 1 TABLET DAILY  30 tablet  3  . atorvastatin (LIPITOR) 40 MG tablet Take 40 mg by mouth daily.        . carvedilol (COREG) 12.5 MG tablet Take 12.5 mg by mouth 2 (two) times daily with a meal.        . fenofibrate 54 MG tablet Take 54 mg by mouth daily.        . hydrochlorothiazide 25 MG tablet Take 25 mg by mouth daily.        Marland Kitchen levothyroxine (SYNTHROID, LEVOTHROID) 150 MCG tablet Take 150 mcg by mouth daily.        . NORVASC 10 MG tablet TAKE (1) TABLET DAILY FOR HIGH BLOOD PRESSURE.  30 each  2  . PRINIVIL 40 MG tablet TAKE 1 TABLET ONCE A DAY FOR HIGH BLOOD PRESSURE  30 each  3  . warfarin (COUMADIN) 5 MG tablet Take 5 mg by mouth as directed.          Past Medical History  Diagnosis Date  . Dilated cardiomyopathy     Felt to be secondary to atrial fibrillation. EF is about 40%  . Atrial fibrillation     Persistent, treated with amiodarone and cardioversion  . Drug therapy     Chronic Coumadin therapy  . CAD (coronary artery disease)     Last catheterization in August 2008 demonstrated the LAD 40% stenosis  . Hypertension   . Dyslipidemia   . Hypothyroidism     Possibly related to amiodarone    Past Surgical History  Procedure Date  . Hip surgery     Left  . Cardiac catheterization  05/2007    LAD 40% stenosis. Stent in the LAD had 20% in-stent restenosis. Followed by 40-50% distal stenosis. 50% diagonal stenosis. 40-50% circumflex stenosis. 70% OM1 stenosis and 30% ostial RCA stenosis. 30% proximal RCA stenosis.    ROS:  As stated in the HPI and negative for all other systems.  PHYSICAL EXAM BP 130/55  Pulse 50  Ht 5\' 9"  (1.753 m)  Wt 188 lb (85.276 kg)  BMI 27.76 kg/m2 GENERAL:  Well appearing HEENT:  Pupils equal round and reactive, fundi not visualized, oral mucosa unremarkable NECK:  No jugular venous distention, waveform within normal limits, carotid upstroke brisk and symmetric, soft right bruits, no thyromegaly LYMPHATICS:  No cervical, inguinal adenopathy LUNGS:  Clear to auscultation bilaterally BACK:  No CVA tenderness CHEST:  Unremarkable HEART:  PMI not displaced or sustained,S1 and S2 within normal limits, no S3, no S4, no clicks, no rubs, apical early peaking systolic murmur radiating out the aortic outflow tract slightly ABD:  Flat, positive bowel sounds normal in frequency in pitch, no bruits, no rebound, no guarding, no midline pulsatile mass, no hepatomegaly, no splenomegaly EXT:  2 plus pulses throughout, no edema, no cyanosis no clubbing SKIN:  No rashes no nodules NEURO:  Cranial nerves II through XII grossly intact, motor grossly intact throughout [facility]:  Cognitively intact, oriented to person place and time  EKG:  His bradycardia, left bundle branch, rate 50, unchanged,  11/20/2011  ASSESSMENT AND PLAN

## 2011-11-20 NOTE — Assessment & Plan Note (Signed)
I will check a carotid Doppler.

## 2011-11-21 ENCOUNTER — Encounter: Payer: Self-pay | Admitting: Cardiology

## 2011-12-02 ENCOUNTER — Encounter (INDEPENDENT_AMBULATORY_CARE_PROVIDER_SITE_OTHER): Payer: Medicare Other | Admitting: Cardiology

## 2011-12-02 DIAGNOSIS — I6529 Occlusion and stenosis of unspecified carotid artery: Secondary | ICD-10-CM

## 2011-12-02 DIAGNOSIS — R0989 Other specified symptoms and signs involving the circulatory and respiratory systems: Secondary | ICD-10-CM

## 2011-12-20 ENCOUNTER — Other Ambulatory Visit: Payer: Self-pay | Admitting: Neurology

## 2011-12-20 DIAGNOSIS — I2510 Atherosclerotic heart disease of native coronary artery without angina pectoris: Secondary | ICD-10-CM

## 2011-12-20 DIAGNOSIS — I499 Cardiac arrhythmia, unspecified: Secondary | ICD-10-CM

## 2011-12-20 DIAGNOSIS — R209 Unspecified disturbances of skin sensation: Secondary | ICD-10-CM

## 2011-12-20 DIAGNOSIS — R252 Cramp and spasm: Secondary | ICD-10-CM

## 2011-12-20 DIAGNOSIS — E785 Hyperlipidemia, unspecified: Secondary | ICD-10-CM

## 2011-12-24 ENCOUNTER — Ambulatory Visit
Admission: RE | Admit: 2011-12-24 | Discharge: 2011-12-24 | Disposition: A | Discharge status: Home / Self Care | Payer: Medicare Other | Source: Ambulatory Visit | Attending: Neurology | Admitting: Neurology

## 2011-12-24 DIAGNOSIS — R209 Unspecified disturbances of skin sensation: Secondary | ICD-10-CM

## 2011-12-24 DIAGNOSIS — E785 Hyperlipidemia, unspecified: Secondary | ICD-10-CM

## 2011-12-24 DIAGNOSIS — I499 Cardiac arrhythmia, unspecified: Secondary | ICD-10-CM

## 2011-12-24 DIAGNOSIS — R252 Cramp and spasm: Secondary | ICD-10-CM

## 2011-12-24 DIAGNOSIS — I2510 Atherosclerotic heart disease of native coronary artery without angina pectoris: Secondary | ICD-10-CM

## 2012-01-07 ENCOUNTER — Other Ambulatory Visit: Payer: Self-pay

## 2012-01-07 MED ORDER — LISINOPRIL 40 MG PO TABS: 40.0000 mg | ORAL_TABLET | Freq: Every day | ORAL | Status: DC

## 2012-01-07 NOTE — Telephone Encounter (Signed)
..   Requested Prescriptions   Pending Prescriptions Disp Refills  . lisinopril (PRINIVIL,ZESTRIL) 40 MG tablet 30 tablet 11    Sig: Take 1 tablet (40 mg total) by mouth daily.

## 2012-02-04 ENCOUNTER — Other Ambulatory Visit: Payer: Self-pay | Admitting: *Deleted

## 2012-02-04 MED ORDER — AMIODARONE HCL 200 MG PO TABS: 200.0000 mg | ORAL_TABLET | Freq: Every day | ORAL | Status: DC

## 2012-02-10 ENCOUNTER — Other Ambulatory Visit: Payer: Self-pay | Admitting: *Deleted

## 2012-02-10 MED ORDER — AMLODIPINE BESYLATE 10 MG PO TABS: 10.0000 mg | ORAL_TABLET | Freq: Every day | ORAL | Status: DC

## 2012-05-08 SURGERY — Surgical Case
Anesthesia: *Unknown

## 2012-09-15 ENCOUNTER — Other Ambulatory Visit: Payer: Self-pay | Admitting: Cardiology

## 2012-09-30 ENCOUNTER — Other Ambulatory Visit: Payer: Self-pay | Admitting: Cardiology

## 2012-12-17 ENCOUNTER — Encounter (INDEPENDENT_AMBULATORY_CARE_PROVIDER_SITE_OTHER): Payer: Medicare Other

## 2012-12-17 DIAGNOSIS — I6529 Occlusion and stenosis of unspecified carotid artery: Secondary | ICD-10-CM

## 2012-12-30 ENCOUNTER — Ambulatory Visit (INDEPENDENT_AMBULATORY_CARE_PROVIDER_SITE_OTHER): Payer: Medicare Other | Admitting: Cardiology

## 2012-12-30 ENCOUNTER — Encounter: Payer: Self-pay | Admitting: Cardiology

## 2012-12-30 VITALS — BP 145/65 | HR 58 | Wt 182.0 lb

## 2012-12-30 DIAGNOSIS — I4891 Unspecified atrial fibrillation: Secondary | ICD-10-CM

## 2012-12-30 DIAGNOSIS — I10 Essential (primary) hypertension: Secondary | ICD-10-CM

## 2012-12-30 DIAGNOSIS — I428 Other cardiomyopathies: Secondary | ICD-10-CM

## 2012-12-30 DIAGNOSIS — R0989 Other specified symptoms and signs involving the circulatory and respiratory systems: Secondary | ICD-10-CM

## 2012-12-30 DIAGNOSIS — I2510 Atherosclerotic heart disease of native coronary artery without angina pectoris: Secondary | ICD-10-CM

## 2012-12-30 NOTE — Patient Instructions (Addendum)
The current medical regimen is effective;  continue present plan and medications.  Follow up in 1 year with Dr Hochrein.  You will receive a letter in the mail 2 months before you are due.  Please call us when you receive this letter to schedule your follow up appointment.  

## 2012-12-30 NOTE — Progress Notes (Signed)
HPI The patient presents for one-year followup of his known coronary disease. Since I last saw him he has done well. He is exercising at the [facility] 3 times per week. With this he denies any cardiovascular symptoms. The patient denies any new symptoms such as chest discomfort, neck or arm discomfort. There has been no new shortness of breath, PND or orthopnea. There have been no reported palpitations, presyncope or syncope.   Allergies  Allergen Reactions  . Penicillins     Current Outpatient Prescriptions  Medication Sig Dispense Refill  . amiodarone (PACERONE) 200 MG tablet TAKE 1 TABLET ONCE DAILY FOR HEART  30 tablet  6  . amLODipine (NORVASC) 10 MG tablet TAKE 1 TABLET DAILY  30 tablet  6  . Calcium Carb-Cholecalciferol (CALCIUM 500 +D PO) Take by mouth.      . carvedilol (COREG) 12.5 MG tablet Take 12.5 mg by mouth 2 (two) times daily with a meal.        . Cyanocobalamin (VITAMIN B-12 CR PO) Take 2,500 mg by mouth daily.      . fenofibrate 54 MG tablet Take 54 mg by mouth daily.        . fish oil-omega-3 fatty acids 1000 MG capsule Take 2 g by mouth daily.      Marland Kitchen gabapentin (NEURONTIN) 100 MG capsule Take 100 mg by mouth 3 (three) times daily.      . hydrochlorothiazide 25 MG tablet Take 25 mg by mouth daily.        Marland Kitchen levothyroxine (SYNTHROID, LEVOTHROID) 150 MCG tablet Take 150 mcg by mouth daily.        Marland Kitchen lisinopril (PRINIVIL,ZESTRIL) 40 MG tablet Take 1 tablet (40 mg total) by mouth daily.  30 tablet  11  . NON FORMULARY vilasmart      . warfarin (COUMADIN) 5 MG tablet Take 5 mg by mouth as directed.         No current facility-administered medications for this visit.    Past Medical History  Diagnosis Date  . Dilated cardiomyopathy     Felt to be secondary to atrial fibrillation. EF is about 40%  . Atrial fibrillation     Persistent, treated with amiodarone and cardioversion  . Drug therapy     Chronic Coumadin therapy  . CAD (coronary artery disease)     Last  catheterization in August 2008 demonstrated the LAD 40% stenosis  . Hypertension   . Dyslipidemia   . Hypothyroidism     Possibly related to amiodarone    Past Surgical History  Procedure Laterality Date  . Hip surgery      Left  . Cardiac catheterization  05/2007    LAD 40% stenosis. Stent in the LAD had 20% in-stent restenosis. Followed by 40-50% distal stenosis. 50% diagonal stenosis. 40-50% circumflex stenosis. 70% OM1 stenosis and 30% ostial RCA stenosis. 30% proximal RCA stenosis.    ROS:  Positive for reflux.  Otherwise as stated in the HPI and negative for all other systems.  PHYSICAL EXAM BP 145/65  Pulse 58  Wt 182 lb (82.555 kg)  BMI 26.86 kg/m2 GENERAL:  Well appearing HEENT:  Pupils equal round and reactive, fundi not visualized, oral mucosa unremarkable NECK:  No jugular venous distention, waveform within normal limits, carotid upstroke brisk and symmetric, soft right bruits, no thyromegaly LYMPHATICS:  No cervical, inguinal adenopathy LUNGS:  Clear to auscultation bilaterally BACK:  No CVA tenderness CHEST:  Unremarkable HEART:  PMI not displaced or sustained,S1 and  S2 within normal limits, no S3, no S4, no clicks, no rubs, apical early peaking systolic murmur radiating out the aortic outflow tract slightly ABD:  Flat, positive bowel sounds normal in frequency in pitch, no bruits, no rebound, no guarding, no midline pulsatile mass, no hepatomegaly, no splenomegaly EXT:  2 plus pulses throughout, no edema, no cyanosis no clubbing SKIN:  No rashes no nodules NEURO:  Cranial nerves II through XII grossly intact, motor grossly intact throughout PSYCH:  Cognitively intact, oriented to person place and time  EKG:  Sinus bradycardia, left bundle branch, rate 55, premature ectopic complexes.   12/30/2012  ASSESSMENT AND PLAN  ATRIAL FIBRILLATION:  He's had no symptomatic paroxysms of this. He's tolerating amiodarone. He doesn't want to switch to a NOAC as he has no  problems with warfarin. He's had appropriate laboratory followup of his amiodarone.  CAD:   The patient has no new sypmtoms.  No further cardiovascular testing is indicated.  We will continue with aggressive risk reduction and meds as listed.  HTN:  His blood pressure is slightly elevated but this is unusual. No change in therapy is indicated.  HYPERLIPIDEMIA:  I reviewed his recent lipid profile. This is followed closely by Dr. Modesto Charon.  No change in therapy is indicated.  CAROTID STENOSIS:  Carotid Doppler earlier this month demonstrated moderate stable bilateral stenosis and this will be followed again in one year.

## 2013-01-04 ENCOUNTER — Other Ambulatory Visit: Payer: Self-pay | Admitting: Neurology

## 2013-01-04 ENCOUNTER — Other Ambulatory Visit: Payer: Self-pay | Admitting: Cardiology

## 2013-01-05 NOTE — Telephone Encounter (Signed)
..   Requested Prescriptions   Pending Prescriptions Disp Refills  . lisinopril (PRINIVIL,ZESTRIL) 40 MG tablet [Pharmacy Med Name: LISINOPRIL 40MG  TABLET] 30 tablet 3    Sig: TAKE 1 TABLET ONCE A DAY FOR HIGH BLOOD PRESSURE

## 2013-01-14 ENCOUNTER — Other Ambulatory Visit: Payer: Self-pay | Admitting: *Deleted

## 2013-01-14 MED ORDER — NITROGLYCERIN 0.4 MG SL SUBL: 0.4000 mg | SUBLINGUAL_TABLET | SUBLINGUAL | Status: DC | PRN

## 2013-01-25 ENCOUNTER — Ambulatory Visit (INDEPENDENT_AMBULATORY_CARE_PROVIDER_SITE_OTHER): Payer: Medicare Other | Admitting: Pharmacist

## 2013-01-25 DIAGNOSIS — I4891 Unspecified atrial fibrillation: Secondary | ICD-10-CM

## 2013-01-25 DIAGNOSIS — I480 Paroxysmal atrial fibrillation: Secondary | ICD-10-CM | POA: Insufficient documentation

## 2013-01-25 NOTE — Patient Instructions (Signed)
Anticoagulation Dose Instructions as of 01/25/2013     [name] Tue Wed Thu Fri Sat   New Dose 7.5 mg 5 mg 5 mg 5 mg 7.5 mg 5 mg 5 mg    Description       Continue 7.5mg  on Sundays and Thursdays, 5mg  all other days

## 2013-01-26 ENCOUNTER — Other Ambulatory Visit: Payer: Self-pay | Admitting: *Deleted

## 2013-01-26 MED ORDER — ZOLPIDEM TARTRATE 5 MG PO TABS: 5.0000 mg | ORAL_TABLET | Freq: Every evening | ORAL | Status: DC | PRN

## 2013-01-26 NOTE — Telephone Encounter (Signed)
Last filled 11/19/12, last seen 10/29/12

## 2013-02-25 ENCOUNTER — Encounter: Payer: Self-pay | Admitting: Family Medicine

## 2013-02-25 ENCOUNTER — Ambulatory Visit (INDEPENDENT_AMBULATORY_CARE_PROVIDER_SITE_OTHER): Payer: Medicare Other | Admitting: Family Medicine

## 2013-02-25 VITALS — BP 142/59 | HR 48 | Temp 97.7°F | Wt 182.0 lb

## 2013-02-25 DIAGNOSIS — I2510 Atherosclerotic heart disease of native coronary artery without angina pectoris: Secondary | ICD-10-CM

## 2013-02-25 DIAGNOSIS — I10 Essential (primary) hypertension: Secondary | ICD-10-CM

## 2013-02-25 DIAGNOSIS — E0590 Thyrotoxicosis, unspecified without thyrotoxic crisis or storm: Secondary | ICD-10-CM

## 2013-02-25 DIAGNOSIS — E785 Hyperlipidemia, unspecified: Secondary | ICD-10-CM

## 2013-02-25 DIAGNOSIS — I428 Other cardiomyopathies: Secondary | ICD-10-CM

## 2013-02-25 DIAGNOSIS — I4891 Unspecified atrial fibrillation: Secondary | ICD-10-CM

## 2013-02-25 DIAGNOSIS — R0989 Other specified symptoms and signs involving the circulatory and respiratory systems: Secondary | ICD-10-CM

## 2013-02-25 NOTE — Progress Notes (Signed)
Patient ID: [name], male   DOB: 1926/03/23, 79 y.o.   MRN: [id number] SUBJECTIVE: HPI: Here to follow up on Protime because of anticoagulatipn for A Fib. Other problems are stable. Sees Endocrine - Dr Sabino Gasser and everything has been adjusted or doing well so far. He wants to stop [place] and/or gabapentin because he doesn't feel he needs it.  Patient is here for follow up of hyperlipidemia: denies Headache;denies Chest Pain;denies weakness;denies Shortness of Breath and orthopnea;denies Visual changes;denies palpitations;denies cough;denies pedal edema;denies symptoms of TIA or stroke;deniesClaudication symptoms. admits to Compliance with medications; denies Problems with medications.  PMH/PSH: reviewed/updated in Epic  SH/FH: reviewed/updated in Epic  Allergies: reviewed/updated in Epic  Medications: reviewed/updated in Epic  Immunizations: reviewed/updated in Epic  ROS: As above in the HPI. All other systems are stable or negative.  OBJECTIVE: APPEARANCE:  Patient in no acute distress.The patient appeared well nourished and normally developed. Acyanotic. Waist: VITAL SIGNS:BP 142/59  Pulse 48  Temp(Src) 97.7 F (36.5 C) (Oral)  Wt 182 lb (82.555 kg)  BMI 26.86 kg/m2   SKIN: warm and  Dry without overt rashes, tattoos and scars  HEAD and Neck: without JVD, Head and scalp: normal Eyes:No scleral icterus. Fundi normal, eye movements normal. Ears: Auricle normal, canal normal, Tympanic membranes normal, insufflation normal. Nose: normal Throat: normal Neck & thyroid: normal  CHEST & LUNGS: Chest wall: normal Lungs: Clear  CVS: Reveals the PMI to be normally located. Controlled rate First and Second Heart sounds are normal,  No change in murmurs, rubs or gallops.  ABDOMEN:  Appearance: normal Benign,, no organomegaly, no masses, no Abdominal Aortic enlargement. No Guarding , no rebound. No Bruits. Bowel sounds: normal  RECTAL: N/A GU:  N/A  EXTREMETIES: nonedematous.  MUSCULOSKELETAL:  Spine: normal  NEUROLOGIC: oriented to time,place and person; nonfocal.  ASSESSMENT: A-fib - Plan: POCT INR, CANCELED: POCT INR  HYPERTHYROIDISM  HYPERTENSION  DYSLIPIDEMIA  CARDIOMYOPATHY  CAD  Bruit  Atrial fibrillation  PLAN:  No orders of the defined types were placed in this encounter.   Results for orders placed in visit on 02/25/13  POCT INR      Result Value Range   INR 2.0     No orders of the defined types were placed in this encounter.   Continue present  Regimen.  Continue with cardiology and endocrine.  RTc in 6 weeks.  Ok to D/C the zolpidem/ambien. On next visit consider wean the gabapentin slowly.  Trindon Dorton P. Modesto Charon, M.D.

## 2013-03-01 ENCOUNTER — Other Ambulatory Visit: Payer: Self-pay | Admitting: Family Medicine

## 2013-03-02 ENCOUNTER — Other Ambulatory Visit: Payer: Self-pay

## 2013-03-02 MED ORDER — ZOLPIDEM TARTRATE 5 MG PO TABS: 5.0000 mg | ORAL_TABLET | Freq: Every evening | ORAL | Status: DC | PRN

## 2013-03-02 NOTE — Telephone Encounter (Signed)
Last filled 01/26/13   Last seen  02/25/13  Phone in and have nurse let patient know

## 2013-03-09 ENCOUNTER — Other Ambulatory Visit: Payer: Self-pay | Admitting: Family Medicine

## 2013-04-08 ENCOUNTER — Ambulatory Visit (INDEPENDENT_AMBULATORY_CARE_PROVIDER_SITE_OTHER): Payer: Medicare Other | Admitting: General Practice

## 2013-04-08 ENCOUNTER — Encounter: Payer: Self-pay | Admitting: General Practice

## 2013-04-08 ENCOUNTER — Ambulatory Visit (INDEPENDENT_AMBULATORY_CARE_PROVIDER_SITE_OTHER): Payer: Medicare Other | Admitting: Pharmacist

## 2013-04-08 VITALS — BP 120/56 | HR 44 | Temp 97.2°F | Wt 182.0 lb

## 2013-04-08 DIAGNOSIS — R209 Unspecified disturbances of skin sensation: Secondary | ICD-10-CM

## 2013-04-08 DIAGNOSIS — I4891 Unspecified atrial fibrillation: Secondary | ICD-10-CM

## 2013-04-08 LAB — POCT CBC
Granulocyte percent: 67.9 %G (ref 37–80)
HCT, POC: 40.5 % — AB (ref 43.5–53.7)
Hemoglobin: 14.5 g/dL (ref 14.1–18.1)
MCHC: 35.9 g/dL — AB (ref 31.8–35.4)
MPV: 8.9 fL (ref 0–99.8)
POC Granulocyte: 2.9 (ref 2–6.9)
POC LYMPH PERCENT: 26.8 %L (ref 10–50)
RDW, POC: 13.9 %

## 2013-04-08 LAB — POCT INR: INR: 2.8

## 2013-04-08 LAB — PROTIME-INR
INR: 2.2 — ABNORMAL HIGH (ref <1.50)
Prothrombin Time: 23.7 seconds — ABNORMAL HIGH (ref 11.6–15.2)

## 2013-04-08 NOTE — Progress Notes (Signed)
  Subjective:    Patient ID: [name], male    DOB: Sep 07, 1926, 79 y.o.   MRN: [id number]  HPI Presents today with complaints of intermittent facial and right side warmth, burning, tingling, and stinging sensation. He reports noticing symptoms when walking. He reports taking neurontin (prescribed by neurologist) at night for same symptoms. He reports the medication has been very effective up until the symptoms started in day time. He also reports taking OTC B12 daily.  Reports taking medications as prescribed. Patient reports being very active with activities such as mowing yards and working in garden. He reports these symptoms have been present for one month.     Review of Systems  Constitutional: Negative for fever and chills.  HENT: Negative for neck pain and neck stiffness.   Respiratory: Negative for chest tightness and shortness of breath.   Cardiovascular: Negative for chest pain and palpitations.  Gastrointestinal: Negative for nausea, vomiting, abdominal pain, diarrhea and blood in stool.  Genitourinary: Negative for hematuria and difficulty urinating.  Musculoskeletal: Negative for myalgias and back pain.  Neurological: Negative for dizziness, syncope, facial asymmetry, weakness and headaches.       Objective:   Physical Exam  Constitutional: He is oriented to person, place, and time. He appears well-developed and well-nourished.  HENT:  Head: Normocephalic and atraumatic.  Right Ear: External ear normal.  Left Ear: External ear normal.  Mouth/Throat: Oropharynx is clear and moist.  Pulmonary/Chest: Effort normal and breath sounds normal. No respiratory distress. He exhibits no tenderness.  Neurological: He is alert and oriented to person, place, and time. He has normal strength and normal reflexes. No cranial nerve deficit or sensory deficit. He displays a negative Romberg sign.  Skin: Skin is warm and dry.  Psychiatric: He has a normal mood and affect.           Assessment & Plan:  1. Paresthesia - POCT CBC - COMPLETE METABOLIC PANEL WITH GFR - Vitamin B12 - Thyroid Panel With TSH -RTO is symptoms worsen -will make referral back to neurologist if no improvement -Patient verbalized understanding -Coralie Keens, FNP-C

## 2013-04-09 LAB — COMPLETE METABOLIC PANEL WITH GFR
ALT: 20 U/L (ref 0–53)
AST: 23 U/L (ref 0–37)
CO2: 24 mEq/L (ref 19–32)
Calcium: 9.3 mg/dL (ref 8.4–10.5)
Chloride: 105 mEq/L (ref 96–112)
GFR, Est African American: 56 mL/min — ABNORMAL LOW
Potassium: 4.3 mEq/L (ref 3.5–5.3)
Sodium: 134 mEq/L — ABNORMAL LOW (ref 135–145)
Total Protein: 7.2 g/dL (ref 6.0–8.3)

## 2013-04-09 LAB — THYROID PANEL WITH TSH
T4, Total: 12.8 ug/dL — ABNORMAL HIGH (ref 5.0–12.5)
TSH: 1.136 u[IU]/mL (ref 0.350–4.500)

## 2013-04-13 ENCOUNTER — Other Ambulatory Visit: Payer: Self-pay | Admitting: Cardiology

## 2013-04-14 ENCOUNTER — Other Ambulatory Visit: Payer: Self-pay | Admitting: Neurology

## 2013-05-03 ENCOUNTER — Other Ambulatory Visit: Payer: Self-pay | Admitting: Cardiology

## 2013-05-11 ENCOUNTER — Other Ambulatory Visit: Payer: Self-pay | Admitting: Family Medicine

## 2013-05-13 ENCOUNTER — Other Ambulatory Visit: Payer: Self-pay | Admitting: *Deleted

## 2013-05-13 MED ORDER — ZOLPIDEM TARTRATE 5 MG PO TABS: 5.0000 mg | ORAL_TABLET | Freq: Every evening | ORAL | Status: DC | PRN

## 2013-05-13 MED ORDER — LISINOPRIL 40 MG PO TABS: ORAL_TABLET | ORAL | Status: DC

## 2013-05-13 MED ORDER — FENOFIBRATE 54 MG PO TABS: 54.0000 mg | ORAL_TABLET | Freq: Every day | ORAL | Status: DC

## 2013-05-13 NOTE — Telephone Encounter (Signed)
Okay to refill. Angeles Paolucci P. Modesto Charon, M.D.

## 2013-05-13 NOTE — Telephone Encounter (Signed)
Phoned in to [facility] pharmacy.

## 2013-05-13 NOTE — Telephone Encounter (Signed)
Patient last seen in office on 04-08-13 for an acute visit. Last seen on 02-25-13 for chronic follow up. Rx last filled on 03-09-13 for #30. Please advise. If approved please route to pool A so nurse can call in to [facility]

## 2013-05-13 NOTE — Telephone Encounter (Signed)
rx called into [facility]  

## 2013-05-20 ENCOUNTER — Telehealth: Payer: Self-pay | Admitting: Pharmacist

## 2013-05-20 ENCOUNTER — Ambulatory Visit (INDEPENDENT_AMBULATORY_CARE_PROVIDER_SITE_OTHER): Payer: Medicare Other | Admitting: Nurse Practitioner

## 2013-05-20 ENCOUNTER — Ambulatory Visit (INDEPENDENT_AMBULATORY_CARE_PROVIDER_SITE_OTHER): Payer: Medicare Other | Admitting: Pharmacist

## 2013-05-20 VITALS — BP 142/67 | HR 52 | Ht 69.0 in | Wt 182.0 lb

## 2013-05-20 DIAGNOSIS — I4891 Unspecified atrial fibrillation: Secondary | ICD-10-CM

## 2013-05-20 DIAGNOSIS — S91009A Unspecified open wound, unspecified ankle, initial encounter: Secondary | ICD-10-CM

## 2013-05-20 DIAGNOSIS — S81811A Laceration without foreign body, right lower leg, initial encounter: Secondary | ICD-10-CM

## 2013-05-20 DIAGNOSIS — S81812A Laceration without foreign body, left lower leg, initial encounter: Secondary | ICD-10-CM

## 2013-05-20 DIAGNOSIS — S81809A Unspecified open wound, unspecified lower leg, initial encounter: Secondary | ICD-10-CM

## 2013-05-20 MED ORDER — GABAPENTIN 100 MG PO CAPS: 300.0000 mg | ORAL_CAPSULE | Freq: Every day | ORAL | Status: DC

## 2013-05-20 MED ORDER — CEPHALEXIN 500 MG PO CAPS: 500.0000 mg | ORAL_CAPSULE | Freq: Four times a day (QID) | ORAL | Status: DC

## 2013-05-20 NOTE — Telephone Encounter (Signed)
done

## 2013-05-20 NOTE — Patient Instructions (Signed)
Anticoagulation Dose Instructions as of 05/20/2013     [name] Tue Wed Thu Fri Sat   New Dose 7.5 mg 5 mg 5 mg 5 mg 7.5 mg 5 mg 5 mg    Description       Continue 7.5mg  on Sundays and Thursdays, 5mg  all other days       INR was 2.1 today

## 2013-05-20 NOTE — Patient Instructions (Signed)
Wound Care Wound care helps prevent pain and infection.  You may need a tetanus shot if:  You cannot remember when you had your last tetanus shot.  You have never had a tetanus shot.  The injury broke your skin. If you need a tetanus shot and you choose not to have one, you may get tetanus. Sickness from tetanus can be serious. HOME CARE   Only take medicine as told by your doctor.  Clean the wound daily with mild soap and water.  Change any bandages (dressings) as told by your doctor.  Put medicated cream and a bandage on the wound as told by your doctor.  Change the bandage if it gets wet, dirty, or starts to smell.  Take showers. Do not take baths, swim, or do anything that puts your wound under water.  Rest and raise (elevate) the wound until the pain and puffiness (swelling) are better.  Keep all doctor visits as told. GET HELP RIGHT AWAY IF:   Yellowish-white fluid (pus) comes from the wound.  Medicine does not lessen your pain.  There is a red streak going away from the wound.  You have a fever. MAKE SURE YOU:   Understand these instructions.  Will watch your condition.  Will get help right away if you are not doing well or get worse. Document Released: 07/09/2008 Document Revised: 12/23/2011 Document Reviewed: 02/03/2011 ExitCare Patient Information 2014 [employer].  

## 2013-05-20 NOTE — Progress Notes (Signed)
  Subjective:    Patient ID: [name], male    DOB: 1926/03/16, 79 y.o.   MRN: [id number]  HPI Patient was mowing yard and ran into a barb wire fence and cut his both lower legs- last tetanus shot was in may 2014.Marland Kitchen    Review of Systems  All other systems reviewed and are negative.       Objective:   Physical Exam  Constitutional: He appears well-developed and well-nourished.  Cardiovascular: Normal rate and normal heart sounds.   Pulmonary/Chest: Effort normal and breath sounds normal.  Skin:     superficial lacerations to right shin with a 1cm deep laceration in center. Superficial lacerations to left shin    Procedure; Wounds cleaned with betadine  Lidocaine 1 % with epi- 2cc right shin laceration Irigated with NACL 3-0 ethilon 2 simple interrupted stitches  Cleaned with NACL Antibiotic ointment pressure dressing bil Patient tolerated well      Assessment & Plan:  1. Laceration of lower leg without complication, left, initial encounter  2. Laceration of right lower extremity, initial encounter Keep wounds clean and dry Antibacterial soap BID Watch for signs of infection  RTO in 10 days stitch removal Meds ordered this encounter  Medications  . cephALEXin (KEFLEX) 500 MG capsule    Sig: Take 1 capsule (500 mg total) by mouth 4 (four) times daily.    Dispense:  30 capsule    Refill:  0    Order Specific Question:  Supervising Provider    Answer:  Ernestina Penna [[id number]]   Mary-Margaret Daphine Deutscher, FNP

## 2013-05-25 ENCOUNTER — Telehealth: Payer: Self-pay | Admitting: Nurse Practitioner

## 2013-05-26 NOTE — Telephone Encounter (Signed)
PATIENT AWARE

## 2013-05-26 NOTE — Telephone Encounter (Signed)
Until it is healed

## 2013-05-27 ENCOUNTER — Encounter: Payer: Self-pay | Admitting: Nurse Practitioner

## 2013-05-27 ENCOUNTER — Ambulatory Visit (INDEPENDENT_AMBULATORY_CARE_PROVIDER_SITE_OTHER): Payer: Medicare Other | Admitting: Nurse Practitioner

## 2013-05-27 VITALS — BP 154/57 | HR 56 | Temp 97.5°F | Ht 67.0 in | Wt 182.0 lb

## 2013-05-27 DIAGNOSIS — T798XXD Other early complications of trauma, subsequent encounter: Secondary | ICD-10-CM

## 2013-05-27 DIAGNOSIS — Z5189 Encounter for other specified aftercare: Secondary | ICD-10-CM

## 2013-05-27 DIAGNOSIS — S91009A Unspecified open wound, unspecified ankle, initial encounter: Secondary | ICD-10-CM

## 2013-05-27 MED ORDER — CIPROFLOXACIN HCL 500 MG PO TABS: 500.0000 mg | ORAL_TABLET | Freq: Two times a day (BID) | ORAL | Status: DC

## 2013-05-27 NOTE — Patient Instructions (Signed)
Wound Infection  A wound infection happens when a type of germ (bacteria) starts growing in the wound. In some cases, this can cause the wound to break open. If cared for properly, the infected wound will heal from the inside to the outside. Wound infections need treatment.  CAUSES  An infection is caused by bacteria growing in the wound.   SYMPTOMS    Increase in redness, swelling, or pain at the wound site.   Increase in drainage at the wound site.   Wound or bandage (dressing) starts to smell bad.   Fever.   Feeling tired or fatigued.   Pus draining from the wound.  TREATMENT   You caregiver will prescribe antibiotic medicine. The wound infection should improve within 24 to 48 hours. Any redness around the wound should stop spreading and the wound should be less painful.   HOME CARE INSTRUCTIONS    Only take over-the-counter or prescription medicines for pain, discomfort, or fever as directed by your caregiver.   Take your antibiotics as directed. Finish them even if you start to feel better.   Gently wash the area with mild soap and water 2 times a day, or as directed. Rinse off the soap. Pat the area dry with a clean towel. Do not rub the wound. This may cause bleeding.   Follow your caregiver's instructions for how often you need to change the dressing.   Apply ointment and a dressing to the wound as directed.   If the dressing sticks, moisten it with soapy water and gently remove it.   Change the bandage right away if it becomes wet, dirty, or develops a bad smell.   Take showers. Do not take tub baths, swim, or do anything that may soak the wound until it is healed.   Avoid exercises that make you sweat heavily.   Use anti-itch medicine as directed by your caregiver. The wound may itch when it is healing. Do not pick or scratch at the wound.   Follow up with your caregiver to get your wound rechecked as directed.  SEEK MEDICAL CARE IF:   You have an increase in swelling, pain, or redness  around the wound.   You have an increase in the amount of pus coming from the wound.   There is a bad smell coming from the wound.   More of the wound breaks open.   You have a fever.  MAKE SURE YOU:    Understand these instructions.   Will watch your condition.   Will get help right away if you are not doing well or get worse.  Document Released: 06/29/2003 Document Revised: 12/23/2011 Document Reviewed: 02/03/2011  Exit[employer] Patient Information 2014 [employer].

## 2013-05-27 NOTE — Progress Notes (Signed)
  Subjective:    Patient ID: [name], male    DOB: 1925-12-02, 79 y.o.   MRN: [id number]  HPI Patint cut both of his shins with a piece of barb wire fence 1 week ago- stitches were put in right shin but left shin laceration was superficial and no stitches were neededd- He is cleaning daily with antibacterial soap- Areas are still red and draining    Review of Systems  All other systems reviewed and are negative.       Objective:   Physical Exam  Skin:  Bilateral lacerated area are mist appearing with erythema and warm to touch. Stitches removed from right shin area (2)    BP 154/57  Pulse 56  Temp(Src) 97.5 F (36.4 C) (Oral)  Ht 5\' 7"  (1.702 m)  Wt 182 lb (82.555 kg)  BMI 28.5 kg/m2       Assessment & Plan:  1. Post-traumatic wound infection, subsequent encounter Continue to clean BID Keep covered when active If not improving RTO - ciprofloxacin (CIPRO) 500 MG tablet; Take 1 tablet (500 mg total) by mouth 2 (two) times daily.  Dispense: 20 tablet; Refill: 0  Mary-Margaret Daphine Deutscher, FNP

## 2013-06-15 ENCOUNTER — Other Ambulatory Visit: Payer: Self-pay | Admitting: Nurse Practitioner

## 2013-06-17 ENCOUNTER — Encounter: Payer: Self-pay | Admitting: *Deleted

## 2013-06-17 ENCOUNTER — Other Ambulatory Visit: Payer: Self-pay | Admitting: Family Medicine

## 2013-06-28 ENCOUNTER — Other Ambulatory Visit: Payer: Self-pay | Admitting: Nurse Practitioner

## 2013-06-29 NOTE — Telephone Encounter (Signed)
NTBS.

## 2013-07-02 ENCOUNTER — Other Ambulatory Visit: Payer: Self-pay | Admitting: Nurse Practitioner

## 2013-07-05 ENCOUNTER — Ambulatory Visit (INDEPENDENT_AMBULATORY_CARE_PROVIDER_SITE_OTHER): Payer: Medicare Other | Admitting: Pharmacist

## 2013-07-05 DIAGNOSIS — Z23 Encounter for immunization: Secondary | ICD-10-CM

## 2013-07-05 DIAGNOSIS — I4891 Unspecified atrial fibrillation: Secondary | ICD-10-CM

## 2013-07-05 NOTE — Telephone Encounter (Signed)
Do not do refill on antibiotics- What does he need for?

## 2013-07-05 NOTE — Telephone Encounter (Signed)
Last seen 05/27/13  MMM 

## 2013-07-13 ENCOUNTER — Other Ambulatory Visit: Payer: Self-pay | Admitting: Family Medicine

## 2013-07-14 NOTE — Telephone Encounter (Signed)
Last seen 02/25/13 for check up, last lipids 01/14. If [name] is approved, route to pool A so it can be called into [facility]

## 2013-07-16 NOTE — Telephone Encounter (Signed)
Was renewed 30 days on 07/13/2013. To be called in.

## 2013-07-16 NOTE — Telephone Encounter (Signed)
Rx ready for pick up. 

## 2013-07-16 NOTE — Telephone Encounter (Signed)
Spoke with Stu at [facility] and zolpidem refilled as well as fenofibrate

## 2013-08-11 ENCOUNTER — Other Ambulatory Visit: Payer: Self-pay | Admitting: Family Medicine

## 2013-08-13 NOTE — Telephone Encounter (Signed)
No cholesterol labs since 01/14

## 2013-08-13 NOTE — Telephone Encounter (Signed)
Patient needs to be seen. Has exceeded time since last visit. Limited quantity refilled. Needs to bring all medications to next appointment.   

## 2013-08-13 NOTE — Telephone Encounter (Signed)
rx for fenofibrate called to [facility].

## 2013-08-16 ENCOUNTER — Ambulatory Visit (INDEPENDENT_AMBULATORY_CARE_PROVIDER_SITE_OTHER): Payer: Medicare Other | Admitting: Pharmacist

## 2013-08-16 DIAGNOSIS — I4891 Unspecified atrial fibrillation: Secondary | ICD-10-CM

## 2013-08-16 NOTE — Patient Instructions (Signed)
Anticoagulation Dose Instructions as of 08/16/2013     [name] Tue Wed Thu Fri Sat   New Dose 7.5 mg 5 mg 5 mg 5 mg 7.5 mg 5 mg 5 mg    Description       Continue 7.5mg  on Sundays and Thursdays, 5mg  all other days      INR was 2.8 today

## 2013-08-25 ENCOUNTER — Other Ambulatory Visit: Payer: Self-pay | Admitting: Family Medicine

## 2013-08-25 ENCOUNTER — Other Ambulatory Visit: Payer: Self-pay | Admitting: General Practice

## 2013-08-26 NOTE — Telephone Encounter (Signed)
LAST OV 05/27/13. CALL IN [facility] LAST RF 07/16/13.

## 2013-08-26 NOTE — Telephone Encounter (Signed)
Please refill ambien

## 2013-08-26 NOTE — Telephone Encounter (Signed)
Script called to [facility]. 

## 2013-09-08 ENCOUNTER — Other Ambulatory Visit: Payer: Self-pay | Admitting: Family Medicine

## 2013-09-13 ENCOUNTER — Other Ambulatory Visit: Payer: Self-pay | Admitting: Family Medicine

## 2013-09-20 ENCOUNTER — Other Ambulatory Visit: Payer: Self-pay | Admitting: Nurse Practitioner

## 2013-09-22 NOTE — Telephone Encounter (Signed)
LAST SEEN 05/27/13, LAST FILLED 08/25/13. [facility] [place] rX

## 2013-09-22 NOTE — Telephone Encounter (Signed)
Please call in rx for ambien

## 2013-09-23 NOTE — Telephone Encounter (Signed)
Done

## 2013-09-24 ENCOUNTER — Ambulatory Visit (INDEPENDENT_AMBULATORY_CARE_PROVIDER_SITE_OTHER): Payer: Medicare Other | Admitting: General Practice

## 2013-09-24 ENCOUNTER — Telehealth: Payer: Self-pay | Admitting: Family Medicine

## 2013-09-24 ENCOUNTER — Encounter: Payer: Self-pay | Admitting: General Practice

## 2013-09-24 VITALS — BP 138/61 | HR 56 | Temp 97.0°F | Ht 67.0 in | Wt 177.0 lb

## 2013-09-24 DIAGNOSIS — R209 Unspecified disturbances of skin sensation: Secondary | ICD-10-CM

## 2013-09-24 DIAGNOSIS — R202 Paresthesia of skin: Secondary | ICD-10-CM

## 2013-09-24 NOTE — Patient Instructions (Signed)
Paresthesia °Paresthesia is an abnormal burning or prickling sensation. This sensation is generally felt in the hands, arms, legs, or feet. However, it may occur in any part of the body. It is usually not painful. The feeling may be described as: °· Tingling or numbness. °· "Pins and needles." °· Skin crawling. °· Buzzing. °· Limbs "falling asleep." °· Itching. °Most people experience temporary (transient) paresthesia at some time in their lives. °CAUSES  °Paresthesia may occur when you breathe too quickly (hyperventilation). It can also occur without any apparent cause. Commonly, paresthesia occurs when pressure is placed on a nerve. The feeling quickly goes away once the pressure is removed. For some people, however, paresthesia is a long-lasting (chronic) condition caused by an underlying disorder. The underlying disorder may be: °· A traumatic, direct injury to nerves. Examples include a: °· Broken (fractured) neck. °· Fractured skull. °· A disorder affecting the brain and spinal cord (central nervous system). Examples include: °· Transverse myelitis. °· Encephalitis. °· Transient ischemic attack. °· Multiple sclerosis. °· Stroke. °· Tumor or blood vessel problems, such as an arteriovenous malformation pressing against the brain or spinal cord. °· A condition that damages the peripheral nerves (peripheral neuropathy). Peripheral nerves are not part of the brain and spinal cord. These conditions include: °· Diabetes. °· Peripheral vascular disease. °· Nerve entrapment syndromes, such as carpal tunnel syndrome. °· Shingles. °· Hypothyroidism. °· Vitamin B12 deficiencies. °· Alcoholism. °· Heavy metal poisoning (lead, arsenic). °· Rheumatoid arthritis. °· Systemic lupus erythematosus. °DIAGNOSIS  °Your caregiver will attempt to find the underlying cause of your paresthesia. Your caregiver may: °· Take your medical history. °· Perform a physical exam. °· Order various lab tests. °· Order imaging tests. °TREATMENT    °Treatment for paresthesia depends on the underlying cause. °HOME CARE INSTRUCTIONS °· Avoid drinking alcohol. °· You may consider massage or acupuncture to help relieve your symptoms. °· Keep all follow-up appointments as directed by your caregiver. °SEEK IMMEDIATE MEDICAL CARE IF:  °· You feel weak. °· You have trouble walking or moving. °· You have problems with speech or vision. °· You feel confused. °· You cannot control your bladder or bowel movements. °· You feel numbness after an injury. °· You faint. °· Your burning or prickling feeling gets worse when walking. °· You have pain, cramps, or dizziness. °· You develop a rash. °MAKE SURE YOU: °· Understand these instructions. °· Will watch your condition. °· Will get help right away if you are not doing well or get worse. °Document Released: 09/20/2002 Document Revised: 12/23/2011 Document Reviewed: 06/21/2011 °ExitCare® Patient Information ©2014 [employer]. ° °

## 2013-09-24 NOTE — Progress Notes (Signed)
   Subjective:    Patient ID: [name], male    DOB: May 11, 1926, 79 y.o.   MRN: [id number]  HPI Patient presents with complaints of increased tingling in legs intermittently. He reports starting on gabapentin and ambien three to four months ago. He now reports tingling and burning in right facial and shoulder area. Reports Remus Loffler is effective with helping him sleep, but gabapentin hasn't been effective with tingling in legs.   Reports he has been seen by a neurologist, MRI of spine completed. He reports Dr. Terrace Arabia suggested MRI of brain and he hasn't followed up to have completed up to this point. Patient would like to discontinue neurontin to see is symptoms of facial and shoulder tingling resolves.    Review of Systems  Constitutional: Negative for fever and chills.  Respiratory: Negative for chest tightness and shortness of breath.   Cardiovascular: Negative for chest pain and palpitations.  Neurological: Negative for dizziness, speech difficulty, weakness, numbness and headaches.       Objective:   Physical Exam  Constitutional: He appears well-developed and well-nourished.  HENT:  Head: Normocephalic and atraumatic.  Right Ear: External ear normal.  Left Ear: External ear normal.  Nose: Nose normal.  Mouth/Throat: Oropharynx is clear and moist.  Eyes: EOM are normal. Pupils are equal, round, and reactive to light.  Neck: Normal range of motion. Neck supple. No thyromegaly present.  Cardiovascular: Regular rhythm and normal heart sounds.  Bradycardia present.   Pulmonary/Chest: Effort normal and breath sounds normal. No respiratory distress. He exhibits no tenderness.  Lymphadenopathy:    He has no cervical adenopathy.          Assessment & Plan:  1. Paresthesia Maintain scheduled appointment with pcp Discussed importance of following up with neurologist for MRI if symptoms worsen or unresolved Discussed uses of gabapentin, but patient wishes to stop taking for two  weeks in hope tingling will improve May continue ambien as prescribed Patient verbalized understanding Coralie Keens, FNP-C

## 2013-09-24 NOTE — Telephone Encounter (Signed)
appt at 10:00 with Mae

## 2013-09-30 ENCOUNTER — Ambulatory Visit (INDEPENDENT_AMBULATORY_CARE_PROVIDER_SITE_OTHER): Payer: Medicare Other | Admitting: Pharmacist

## 2013-09-30 DIAGNOSIS — I4891 Unspecified atrial fibrillation: Secondary | ICD-10-CM

## 2013-09-30 LAB — POCT INR: INR: 2

## 2013-09-30 NOTE — Patient Instructions (Addendum)
Anticoagulation Dose Instructions as of 09/30/2013     Glynis Smiles Tue Wed Thu Fri Sat   New Dose 7.5 mg 5 mg 5 mg 5 mg 7.5 mg 5 mg 5 mg    Description       Continue 7.5mg  on Sundays and Thursdays, 5mg  all other days    INR was 2.0 today     Shingles Vaccine What You Need to Know WHAT IS SHINGLES?  Shingles is a painful skin rash, often with blisters. It is also called Herpes Zoster or just Zoster.  A shingles rash usually appears on one side of the face or body and lasts from 2 to 4 weeks. Its main symptom is pain, which can be quite severe. Other symptoms of shingles can include fever, headache, chills, and upset stomach. Very rarely, a shingles infection can lead to pneumonia, hearing problems, blindness, brain inflammation (encephalitis), or death.  For about 1 person in 5, severe pain can continue even after the rash clears up. This is called post-herpetic neuralgia.  Shingles is caused by the Varicella Zoster virus. This is the same virus that causes chickenpox. Only someone who has had a case of chickenpox or rarely, has gotten chickenpox vaccine, can get shingles. The virus stays in your body. It can reappear many years later to cause a case of shingles.  You cannot catch shingles from another person with shingles. However, a person who has never had chickenpox (or chickenpox vaccine) could get chickenpox from someone with shingles. This is not very common.  Shingles is far more common in people 72 and older than in younger people. It is also more common in people whose immune systems are weakened because of a disease such as cancer or drugs such as steroids or chemotherapy.  At least 1 million people get shingles per year in the [place]. SHINGLES VACCINE  A vaccine for shingles was licensed in 2006. In clinical trials, the vaccine reduced the risk of shingles by 50%. It can also reduce the pain in people who still get shingles after being vaccinated.  A single dose of  shingles vaccine is recommended for adults 80 years of age and older. SOME PEOPLE SHOULD NOT GET SHINGLES VACCINE OR SHOULD WAIT A person should not get shingles vaccine if he or she:  Has ever had a life-threatening allergic reaction to gelatin, the antibiotic neomycin, or any other component of shingles vaccine. Tell your caregiver if you have any severe allergies.  Has a weakened immune system because of current:  AIDS or another disease that affects the immune system.  Treatment with drugs that affect the immune system, such as prolonged use of high-dose steroids.  Cancer treatment, such as radiation or chemotherapy.  Cancer affecting the bone marrow or lymphatic system, such as leukemia or lymphoma.  Is pregnant, or might be pregnant. Women should not become pregnant until at least 4 weeks after getting shingles vaccine. Someone with a minor illness, such as a cold, may be vaccinated. Anyone with a moderate or severe acute illness should usually wait until he or she recovers before getting the vaccine. This includes anyone with a temperature of 101.3 F (38 C) or higher. WHAT ARE THE RISKS FROM SHINGLES VACCINE?  A vaccine, like any medicine, could possibly cause serious problems, such as severe allergic reactions. However, the risk of a vaccine causing serious harm, or death, is extremely small.  No serious problems have been identified with shingles vaccine. Mild Problems  Redness, soreness, swelling,  or itching at the site of the injection (about 1 person in 3).  Headache (about 1 person in 70). Like all vaccines, shingles vaccine is being closely monitored for unusual or severe problems. WHAT IF THERE IS A MODERATE OR SEVERE REACTION? What should I look for? Any unusual condition, such as a severe allergic reaction or a high fever. If a severe allergic reaction occurred, it would be within a few minutes to an hour after the shot. Signs of a serious allergic reaction can  include difficulty breathing, weakness, hoarseness or wheezing, a fast heartbeat, hives, dizziness, paleness, or swelling of the throat. What should I do?  Call your caregiver, or get the person to a caregiver right away.  Tell the caregiver what happened, the date and time it happened, and when the vaccination was given.  Ask the caregiver to report the reaction by filing a Vaccine Adverse Event Reporting System (VAERS) form. Or, you can file this report through the VAERS web site at www.vaers.LAgents.no or by calling 1-610-698-2539. VAERS does not provide medical advice. HOW CAN I LEARN MORE?  Ask your caregiver. He or she can give you the vaccine package insert or suggest other sources of information.  Contact the Centers for Disease Control and Prevention (CDC):  Call 830-870-8408 (1-800-CDC-INFO).  Visit the CDC website at PicCapture.uy CDC Shingles Vaccine VIS (07/19/08) Document Released: 07/28/2006 Document Revised: 12/23/2011 Document Reviewed: 01/20/2013 [facility] Patient Information 2014 [place], [place].

## 2013-10-05 ENCOUNTER — Ambulatory Visit (HOSPITAL_COMMUNITY)
Admission: RE | Admit: 2013-10-05 | Discharge: 2013-10-05 | Disposition: A | Discharge status: Home / Self Care | Payer: Medicare Other | Source: Ambulatory Visit | Attending: Family Medicine | Admitting: Family Medicine

## 2013-10-05 ENCOUNTER — Ambulatory Visit (INDEPENDENT_AMBULATORY_CARE_PROVIDER_SITE_OTHER): Payer: Medicare Other | Admitting: Family Medicine

## 2013-10-05 ENCOUNTER — Encounter: Payer: Self-pay | Admitting: Family Medicine

## 2013-10-05 ENCOUNTER — Other Ambulatory Visit: Payer: Self-pay | Admitting: Family Medicine

## 2013-10-05 VITALS — BP 121/58 | HR 53 | Temp 98.1°F | Ht 67.0 in | Wt 179.2 lb

## 2013-10-05 DIAGNOSIS — R531 Weakness: Secondary | ICD-10-CM | POA: Insufficient documentation

## 2013-10-05 DIAGNOSIS — Z7901 Long term (current) use of anticoagulants: Secondary | ICD-10-CM | POA: Insufficient documentation

## 2013-10-05 DIAGNOSIS — R209 Unspecified disturbances of skin sensation: Secondary | ICD-10-CM | POA: Insufficient documentation

## 2013-10-05 DIAGNOSIS — R202 Paresthesia of skin: Secondary | ICD-10-CM

## 2013-10-05 DIAGNOSIS — I7090 Unspecified atherosclerosis: Secondary | ICD-10-CM | POA: Insufficient documentation

## 2013-10-05 DIAGNOSIS — Q048 Other specified congenital malformations of brain: Secondary | ICD-10-CM | POA: Insufficient documentation

## 2013-10-05 DIAGNOSIS — Q046 Congenital cerebral cysts: Secondary | ICD-10-CM

## 2013-10-05 DIAGNOSIS — M6281 Muscle weakness (generalized): Secondary | ICD-10-CM

## 2013-10-05 LAB — POCT CBC
Granulocyte percent: 69.4 %G (ref 37–80)
HCT, POC: 45.6 % (ref 43.5–53.7)
Hemoglobin: 14.3 g/dL (ref 14.1–18.1)
Lymph, poc: 1.2 (ref 0.6–3.4)
MCH, POC: 25.9 pg — AB (ref 27–31.2)
MCHC: 31.3 g/dL — AB (ref 31.8–35.4)
MCV: 82.8 fL (ref 80–97)
MPV: 8.2 fL (ref 0–99.8)
POC Granulocyte: 3.3 (ref 2–6.9)
POC LYMPH PERCENT: 25.5 %L (ref 10–50)
Platelet Count, POC: 207 10*3/uL (ref 142–424)
RBC: 5.5 M/uL (ref 4.69–6.13)
RDW, POC: 13.8 %
WBC: 4.8 10*3/uL (ref 4.6–10.2)

## 2013-10-05 LAB — POCT INR: INR: 2.5

## 2013-10-05 NOTE — Progress Notes (Signed)
Patient ID: [name], male   DOB: [birth year], 79 y.o.   MRN: [id number] SUBJECTIVE: CC: Chief Complaint  Patient presents with  . Follow-up    burning and stinging on rt side of face at night time. states when driving legs feel tight from knees down    HPI: For several months now has had paresthesias of the face. Wondered if he had shingles without rash has seen a neurologist Dr Terrace Arabia and has had Mri of th eneck and limbar area. But the last few weeks it has been worse when he lies down. He would get his face more tingly and his right side of his body would be tight and feels funny and he would have to sit up. He is on warfarin. No head injury. No falling. No blurred vision, no headache but his head feels tight. Work up reviewed in [employer]. No head scan done. He stopped going back to the neurologist because the gabapentin did not make any difference.   Past Medical History  Diagnosis Date  . Dilated cardiomyopathy     Felt to be secondary to atrial fibrillation. EF is about 40%  . Atrial fibrillation     Persistent, treated with amiodarone and cardioversion  . Drug therapy     Chronic Coumadin therapy  . CAD (coronary artery disease)     Last catheterization in August 2008 demonstrated the LAD 40% stenosis  . Hypertension   . Dyslipidemia   . Hypothyroidism     Possibly related to amiodarone   Past Surgical History  Procedure Laterality Date  . Hip surgery      Left  . Cardiac catheterization  05/2007    LAD 40% stenosis. Stent in the LAD had 20% in-stent restenosis. Followed by 40-50% distal stenosis. 50% diagonal stenosis. 40-50% circumflex stenosis. 70% OM1 stenosis and 30% ostial RCA stenosis. 30% proximal RCA stenosis.   History   Social History  . Marital Status: Widowed    Spouse Name: N/A    Number of Children: N/A  . Years of Education: N/A   Occupational History  . Part Time    Social History Main Topics  . Smoking status: Never Smoker   . Smokeless  tobacco: Never Used  . Alcohol Use: No  . Drug Use: No  . Sexual Activity: Not on file   Other Topics Concern  . Not on file   Social History Narrative   Widowed   Gets regular exercise   Family History  Problem Relation Age of Onset  . Heart attack Mother   . Heart attack Father   . Diabetes Other   . Stroke Other    Current Outpatient Prescriptions on File Prior to Visit  Medication Sig Dispense Refill  . amiodarone (PACERONE) 200 MG tablet TAKE 1 TABLET ONCE DAILY FOR HEART  30 tablet  6  . amLODipine (NORVASC) 10 MG tablet TAKE 1 TABLET ONCE A DAY  30 tablet  6  . Calcium Carb-Cholecalciferol (CALCIUM 500 +D PO) Take by mouth.      . carvedilol (COREG) 12.5 MG tablet TAKE  (1)  TABLET TWICE A DAY.  60 tablet  1  . Cyanocobalamin (VITAMIN B-12 CR PO) Take 2,500 mg by mouth daily.      . fenofibrate 54 MG tablet TAKE 1 TABLET DAILY  30 tablet  0  . fish oil-omega-3 fatty acids 1000 MG capsule Take 2 g by mouth daily.      . hydrochlorothiazide (HYDRODIURIL) 25 MG tablet  TAKE 1 TABLET DAILY  90 tablet  1  . levothyroxine (SYNTHROID, LEVOTHROID) 150 MCG tablet Take 150 mcg by mouth daily.        Marland Kitchen lisinopril (PRINIVIL,ZESTRIL) 40 MG tablet TAKE 1 TABLET ONCE A DAY FOR HIGH BLOOD PRESSURE  30 tablet  6  . NON FORMULARY vilasmart      . warfarin (COUMADIN) 5 MG tablet TAKE 1 TABLET DAILY OR AS DIRECTED  32 tablet  2  . zolpidem (AMBIEN) 5 MG tablet TAKE ONE TABLET AT BEDTIME  30 tablet  1  . nitroGLYCERIN (NITROSTAT) 0.4 MG SL tablet Place 1 tablet (0.4 mg total) under the tongue every 5 (five) minutes as needed for chest pain.  25 tablet  1   No current facility-administered medications on file prior to visit.   Allergies  Allergen Reactions  . Penicillins    Immunization History  Administered Date(s) Administered  . Influenza,inj,Quad PF,36+ Mos 07/05/2013  . Tdap 10/14/2008   Prior to Admission medications   Medication Sig Start Date End Date Taking? Authorizing  Provider  amiodarone (PACERONE) 200 MG tablet TAKE 1 TABLET ONCE DAILY FOR HEART 05/03/13   Rollene Rotunda, MD  amLODipine (NORVASC) 10 MG tablet TAKE 1 TABLET ONCE A DAY 04/13/13   Rollene Rotunda, MD  Calcium Carb-Cholecalciferol (CALCIUM 500 +D PO) Take by mouth.    Historical Provider, MD  carvedilol (COREG) 12.5 MG tablet TAKE  (1)  TABLET TWICE A DAY. 03/09/13   Ileana Ladd, MD  Cyanocobalamin (VITAMIN B-12 CR PO) Take 2,500 mg by mouth daily.    Historical Provider, MD  fenofibrate 54 MG tablet TAKE 1 TABLET DAILY 09/13/13   Ileana Ladd, MD  fish oil-omega-3 fatty acids 1000 MG capsule Take 2 g by mouth daily.    Historical Provider, MD  hydrochlorothiazide (HYDRODIURIL) 25 MG tablet TAKE 1 TABLET DAILY 06/15/13   Ileana Ladd, MD  levothyroxine (SYNTHROID, LEVOTHROID) 150 MCG tablet Take 150 mcg by mouth daily.      Historical Provider, MD  lisinopril (PRINIVIL,ZESTRIL) 40 MG tablet TAKE 1 TABLET ONCE A DAY FOR HIGH BLOOD PRESSURE 05/13/13   Ileana Ladd, MD  nitroGLYCERIN (NITROSTAT) 0.4 MG SL tablet Place 1 tablet (0.4 mg total) under the tongue every 5 (five) minutes as needed for chest pain. 01/14/13   Ileana Ladd, MD  NON FORMULARY vilasmart    Historical Provider, MD  warfarin (COUMADIN) 5 MG tablet TAKE 1 TABLET DAILY OR AS DIRECTED 09/08/13   Mary-Margaret Daphine Deutscher, FNP  zolpidem (AMBIEN) 5 MG tablet TAKE ONE TABLET AT BEDTIME 09/20/13   Mary-Margaret Martin, FNP     ROS: As above in the HPI. All other systems are stable or negative.  OBJECTIVE: APPEARANCE:  Patient in no acute distress.The patient appeared well nourished and normally developed. Acyanotic. Waist: VITAL SIGNS:BP 121/58  Pulse 53  Temp(Src) 98.1 F (36.7 C) (Oral)  Ht 5\' 7"  (1.702 m)  Wt 179 lb 3.2 oz (81.285 kg)  BMI 28.06 kg/m2  WM  No facial rash. SKIN: warm and  Dry without overt rashes, tattoos and scars  HEAD and Neck: without JVD, Head and scalp: normal Eyes:No scleral icterus. Fundi  normal, eye movements normal. Ears: Auricle normal, canal normal, Tympanic membranes normal, insufflation normal. Nose: normal Throat: normal Neck & thyroid: normal  CHEST & LUNGS: Chest wall: normal Lungs: Clear  CVS: Reveals the PMI to be normally located. Regular rhythm, First and Second Heart sounds are normal,  absence  of murmurs, rubs or gallops. Peripheral vasculature: Radial pulses: normal Dorsal pedis pulses: normal Posterior pulses: normal  ABDOMEN:  Appearance: normal Benign, no organomegaly, no masses, no Abdominal Aortic enlargement. No Guarding , no rebound. No Bruits. Bowel sounds: normal  RECTAL: N/A GU: N/A  EXTREMETIES: nonedematous.  MUSCULOSKELETAL:  Spine: normal Joints: intact  NEUROLOGIC: oriented to time,place and person; nonfocal. Strength is abnormal: subtle weakness on the right upper extremity in all areas. Sensory is normal Reflexes are normal Cranial Nerves are normal. Results for orders placed in visit on 10/05/13  POCT INR      Result Value Range   INR 2.5    POCT CBC      Result Value Range   WBC 4.8  4.6 - 10.2 K/uL   Lymph, poc 1.2  0.6 - 3.4   POC LYMPH PERCENT 25.5  10 - 50 %L   POC Granulocyte 3.3  2 - 6.9   Granulocyte percent 69.4  37 - 80 %G   RBC 5.5  4.69 - 6.13 M/uL   Hemoglobin 14.3  14.1 - 18.1 g/dL   HCT, POC 16.1  09.6 - 53.7 %   MCV 82.8  80 - 97 fL   MCH, POC 25.9 (*) 27 - 31.2 pg   MCHC 31.3 (*) 31.8 - 35.4 g/dL   RDW, POC 04.5     Platelet Count, POC 207.0  142 - 424 K/uL   MPV 8.2  0 - 99.8 fL    ASSESSMENT: Paresthesias - Plan: POCT CBC, CMP14+EGFR, Folate, Vitamin B12, Vit D  25 hydroxy (rtn osteoporosis monitoring), TSH, CT Head Wo Contrast  Long term (current) use of anticoagulants - Plan: POCT INR, CT Head Wo Contrast  Right sided weakness Need to r/o a subdural hematoma in view of a fib and warfarin and symptom complex.  PLAN:  Orders Placed This Encounter  Procedures  . CT Head Wo  Contrast    Standing Status: Future     Number of Occurrences:      Standing Expiration Date: 01/04/2015    Order Specific Question:  Reason for Exam (SYMPTOM  OR DIAGNOSIS REQUIRED)    Answer:  right sided paresthesias on coumadin when he lies down Sx worse. R/O Subdural hematoma.    Order Specific Question:  Preferred imaging location?    Answer:  [facility]  . CMP14+EGFR  . Folate  . Vitamin B12  . Vit D  25 hydroxy (rtn osteoporosis monitoring)  . TSH  . POCT INR  . POCT CBC  obtained approval for CT head to r/o IC lesion such as a subdural hematoma  No orders of the defined types were placed in this encounter.   There are no discontinued medications. Return in about 1 week (around 10/12/2013) for Recheck medical problems.  Jaksen Fiorella P. [place] Charon, M.D.

## 2013-10-06 LAB — VITAMIN B12: Vitamin B-12: 688 pg/mL (ref 211–946)

## 2013-10-06 LAB — CMP14+EGFR
ALT: 17 IU/L (ref 0–44)
AST: 19 IU/L (ref 0–40)
Albumin/Globulin Ratio: 2 (ref 1.1–2.5)
Albumin: 4.5 g/dL (ref 3.5–4.7)
Alkaline Phosphatase: 55 IU/L (ref 39–117)
BUN/Creatinine Ratio: 22 (ref 10–22)
BUN: 28 mg/dL — ABNORMAL HIGH (ref 8–27)
CO2: 23 mmol/L (ref 18–29)
Calcium: 9.3 mg/dL (ref 8.6–10.2)
Chloride: 100 mmol/L (ref 97–108)
Creatinine, Ser: 1.29 mg/dL — ABNORMAL HIGH (ref 0.76–1.27)
GFR calc Af Amer: 58 mL/min/{1.73_m2} — ABNORMAL LOW (ref >59)
GFR calc non Af Amer: 50 mL/min/{1.73_m2} — ABNORMAL LOW (ref >59)
Globulin, Total: 2.3 g/dL (ref 1.5–4.5)
Glucose: 125 mg/dL — ABNORMAL HIGH (ref 65–99)
Potassium: 4.2 mmol/L (ref 3.5–5.2)
Sodium: 138 mmol/L (ref 134–144)
Total Bilirubin: 0.4 mg/dL (ref 0.0–1.2)
Total Protein: 6.8 g/dL (ref 6.0–8.5)

## 2013-10-06 LAB — VITAMIN D 25 HYDROXY (VIT D DEFICIENCY, FRACTURES): Vit D, 25-Hydroxy: 27.7 ng/mL — ABNORMAL LOW (ref 30.0–100.0)

## 2013-10-06 LAB — FOLATE: Folate: >19.9 ng/mL (ref >3.0)

## 2013-10-06 LAB — TSH: TSH: 1.23 u[IU]/mL (ref 0.450–4.500)

## 2013-10-06 NOTE — Progress Notes (Signed)
Patient ID: [name], male   DOB: 1925/11/10, 79 y.o.   MRN: [id number] Discussed wit Dr Danielle Dess on Wednesday am, he will see patient in the office today.patient given the address and will be on his way . Driver is his brother.  Rustin Erhart P. [place] Charon, M.D.

## 2013-10-13 ENCOUNTER — Other Ambulatory Visit: Payer: Self-pay | Admitting: Family Medicine

## 2013-10-18 NOTE — Telephone Encounter (Signed)
Last seen 10/05/13  FPW  No lipids draw since EPIC

## 2013-10-19 NOTE — Telephone Encounter (Signed)
Patient needs to be seen. Has exceeded time since last visit. Limited quantity refilled. Needs to bring all medications to next appointment.   

## 2013-11-01 ENCOUNTER — Encounter (INDEPENDENT_AMBULATORY_CARE_PROVIDER_SITE_OTHER): Payer: Self-pay

## 2013-11-01 ENCOUNTER — Other Ambulatory Visit (INDEPENDENT_AMBULATORY_CARE_PROVIDER_SITE_OTHER): Payer: Medicare Other

## 2013-11-01 ENCOUNTER — Other Ambulatory Visit: Payer: Self-pay | Admitting: Family Medicine

## 2013-11-01 DIAGNOSIS — R739 Hyperglycemia, unspecified: Secondary | ICD-10-CM

## 2013-11-01 DIAGNOSIS — E559 Vitamin D deficiency, unspecified: Secondary | ICD-10-CM

## 2013-11-01 DIAGNOSIS — E785 Hyperlipidemia, unspecified: Secondary | ICD-10-CM

## 2013-11-01 DIAGNOSIS — Z7901 Long term (current) use of anticoagulants: Secondary | ICD-10-CM

## 2013-11-01 NOTE — Progress Notes (Signed)
Labs only

## 2013-11-03 LAB — CMP14+EGFR
ALT: 17 IU/L (ref 0–44)
AST: 25 IU/L (ref 0–40)
Albumin/Globulin Ratio: 1.6 (ref 1.1–2.5)
Albumin: 4.3 g/dL (ref 3.5–4.7)
Alkaline Phosphatase: 60 IU/L (ref 39–117)
BUN/Creatinine Ratio: 17 (ref 10–22)
BUN: 22 mg/dL (ref 8–27)
CO2: 23 mmol/L (ref 18–29)
Calcium: 9.5 mg/dL (ref 8.6–10.2)
Chloride: 102 mmol/L (ref 97–108)
Creatinine, Ser: 1.33 mg/dL — ABNORMAL HIGH (ref 0.76–1.27)
GFR calc Af Amer: 56 mL/min/{1.73_m2} — ABNORMAL LOW (ref >59)
GFR calc non Af Amer: 48 mL/min/{1.73_m2} — ABNORMAL LOW (ref >59)
Globulin, Total: 2.7 g/dL (ref 1.5–4.5)
Glucose: 134 mg/dL — ABNORMAL HIGH (ref 65–99)
Potassium: 4.4 mmol/L (ref 3.5–5.2)
Sodium: 142 mmol/L (ref 134–144)
Total Bilirubin: 0.4 mg/dL (ref 0.0–1.2)
Total Protein: 7 g/dL (ref 6.0–8.5)

## 2013-11-03 LAB — VITAMIN D 25 HYDROXY (VIT D DEFICIENCY, FRACTURES): Vit D, 25-Hydroxy: 25.2 ng/mL — ABNORMAL LOW (ref 30.0–100.0)

## 2013-11-03 LAB — NMR, LIPOPROFILE
Cholesterol: 193 mg/dL (ref <200)
HDL Cholesterol by NMR: 28 mg/dL — ABNORMAL LOW (ref >=40)
HDL Particle Number: 25.2 umol/L — ABNORMAL LOW (ref >=30.5)
LDL Particle Number: 2252 nmol/L — ABNORMAL HIGH (ref <1000)
LDL Size: 19.7 nm — ABNORMAL LOW (ref >20.5)
LDLC SERPL CALC-MCNC: 90 mg/dL (ref <100)
LP-IR Score: 62 — ABNORMAL HIGH (ref <=45)
Small LDL Particle Number: 1899 nmol/L — ABNORMAL HIGH (ref <=527)
Triglycerides by NMR: 373 mg/dL — ABNORMAL HIGH (ref <150)

## 2013-11-04 ENCOUNTER — Telehealth: Payer: Self-pay | Admitting: Family Medicine

## 2013-11-04 NOTE — Telephone Encounter (Signed)
Pt aware results not reviewed yet by Dr Jacelyn Grip and aware will call when results have been reviewed

## 2013-11-07 NOTE — Progress Notes (Signed)
Quick Note:  Call Patient Labs that are abnormal: Vit D is low Lipids are not at goal Chronic Kidney disease is Stable Sugar is a little elevated  The rest are at goal  Recommendations: Needs to increase the fenofibrate. Needs to be on vit d 2000 units daily. Can take OTC. Will need to have Protime checked in 4 weeks because the increased fenofibrate will affect the coumadin. He needs to let us know if he is willing to have me increase the fenofibrate.  ______

## 2013-11-08 ENCOUNTER — Other Ambulatory Visit: Payer: Self-pay | Admitting: Cardiology

## 2013-11-08 ENCOUNTER — Other Ambulatory Visit: Payer: Self-pay | Admitting: Family Medicine

## 2013-11-08 DIAGNOSIS — E785 Hyperlipidemia, unspecified: Secondary | ICD-10-CM

## 2013-11-08 MED ORDER — FENOFIBRATE 120 MG PO TABS: ORAL_TABLET | ORAL | Status: DC

## 2013-11-09 ENCOUNTER — Telehealth: Payer: Self-pay | Admitting: Family Medicine

## 2013-11-09 NOTE — Telephone Encounter (Signed)
Pt cant afford increased dose of fenofibrate. Will cost 170 per pt. Wants something cheaper to the drug store.

## 2013-11-09 NOTE — Telephone Encounter (Signed)
Let's make an appointment with [name] or [name] to figure out what we can use with his coumadin that he could afford. In the meantime use his old dose. Nasra Counce P. Jacelyn Grip, M.D.

## 2013-11-09 NOTE — Telephone Encounter (Signed)
appt made with pharmacist and pt verbalized understanding.

## 2013-11-09 NOTE — Telephone Encounter (Signed)
Send to [facility] and not the [facility].

## 2013-11-11 ENCOUNTER — Ambulatory Visit (INDEPENDENT_AMBULATORY_CARE_PROVIDER_SITE_OTHER): Payer: Medicare Other | Admitting: Pharmacist

## 2013-11-11 DIAGNOSIS — I4891 Unspecified atrial fibrillation: Secondary | ICD-10-CM

## 2013-11-11 LAB — POCT INR: INR: 2.1

## 2013-11-11 NOTE — Patient Instructions (Signed)
Anticoagulation Dose Instructions as of 11/11/2013     [name] Tue Wed Thu Fri Sat   New Dose 7.5 mg 5 mg 5 mg 5 mg 7.5 mg 5 mg 5 mg    Description       Continue 7.5mg  on Sundays and Thursdays, 5mg  all other days      INR was 2.1 today

## 2013-11-18 ENCOUNTER — Ambulatory Visit (INDEPENDENT_AMBULATORY_CARE_PROVIDER_SITE_OTHER): Payer: Medicare Other | Admitting: Family Medicine

## 2013-11-18 ENCOUNTER — Encounter: Payer: Self-pay | Admitting: Family Medicine

## 2013-11-18 VITALS — BP 126/85 | HR 55 | Temp 97.0°F | Ht 67.0 in | Wt 180.4 lb

## 2013-11-18 DIAGNOSIS — R0989 Other specified symptoms and signs involving the circulatory and respiratory systems: Secondary | ICD-10-CM

## 2013-11-18 DIAGNOSIS — E039 Hypothyroidism, unspecified: Secondary | ICD-10-CM | POA: Insufficient documentation

## 2013-11-18 DIAGNOSIS — I428 Other cardiomyopathies: Secondary | ICD-10-CM

## 2013-11-18 DIAGNOSIS — E785 Hyperlipidemia, unspecified: Secondary | ICD-10-CM

## 2013-11-18 DIAGNOSIS — E0590 Thyrotoxicosis, unspecified without thyrotoxic crisis or storm: Secondary | ICD-10-CM

## 2013-11-18 DIAGNOSIS — I4891 Unspecified atrial fibrillation: Secondary | ICD-10-CM

## 2013-11-18 DIAGNOSIS — Z7901 Long term (current) use of anticoagulants: Secondary | ICD-10-CM

## 2013-11-18 DIAGNOSIS — I2510 Atherosclerotic heart disease of native coronary artery without angina pectoris: Secondary | ICD-10-CM

## 2013-11-18 MED ORDER — LEVOTHYROXINE SODIUM 150 MCG PO TABS: 150.0000 ug | ORAL_TABLET | Freq: Every day | ORAL | Status: DC

## 2013-11-18 NOTE — Patient Instructions (Signed)
Hypertriglyceridemia  Diet for High blood levels of Triglycerides Most fats in food are triglycerides. Triglycerides in your blood are stored as fat in your body. High levels of triglycerides in your blood may put you at a greater risk for heart disease and stroke.  Normal triglyceride levels are less than 150 mg/dL. Borderline high levels are 150-199 mg/dl. High levels are 200 - 499 mg/dL, and very high triglyceride levels are greater than 500 mg/dL. The decision to treat high triglycerides is generally based on the level. For people with borderline or high triglyceride levels, treatment includes weight loss and exercise. Drugs are recommended for people with very high triglyceride levels. Many people who need treatment for high triglyceride levels have metabolic syndrome. This syndrome is a collection of disorders that often include: insulin resistance, high blood pressure, blood clotting problems, high cholesterol and triglycerides. TESTING PROCEDURE FOR TRIGLYCERIDES  You should not eat 4 hours before getting your triglycerides measured. The normal range of triglycerides is between 10 and 250 milligrams per deciliter (mg/dl). Some people may have extreme levels (1000 or above), but your triglyceride level may be too high if it is above 150 mg/dl, depending on what other risk factors you have for heart disease.  People with high blood triglycerides may also have high blood cholesterol levels. If you have high blood cholesterol as well as high blood triglycerides, your risk for heart disease is probably greater than if you only had high triglycerides. High blood cholesterol is one of the main risk factors for heart disease. CHANGING YOUR DIET  Your weight can affect your blood triglyceride level. If you are more than 20% above your ideal body weight, you may be able to lower your blood triglycerides by losing weight. Eating less and exercising regularly is the best way to combat this. Fat provides more  calories than any other food. The best way to lose weight is to eat less fat. Only 30% of your total calories should come from fat. Less than 7% of your diet should come from saturated fat. A diet low in fat and saturated fat is the same as a diet to decrease blood cholesterol. By eating a diet lower in fat, you may lose weight, lower your blood cholesterol, and lower your blood triglyceride level.  Eating a diet low in fat, especially saturated fat, may also help you lower your blood triglyceride level. Ask your dietitian to help you figure how much fat you can eat based on the number of calories your caregiver has prescribed for you.  Exercise, in addition to helping with weight loss may also help lower triglyceride levels.   Alcohol can increase blood triglycerides. You may need to stop drinking alcoholic beverages.  Too much carbohydrate in your diet may also increase your blood triglycerides. Some complex carbohydrates are necessary in your diet. These may include bread, rice, potatoes, other starchy vegetables and cereals.  Reduce "simple" carbohydrates. These may include pure sugars, candy, honey, and jelly without losing other nutrients. If you have the kind of high blood triglycerides that is affected by the amount of carbohydrates in your diet, you will need to eat less sugar and less high-sugar foods. Your caregiver can help you with this.  Adding 2-4 grams of fish oil (EPA+ DHA) may also help lower triglycerides. Speak with your caregiver before adding any supplements to your regimen. Following the Diet  Maintain your ideal weight. Your caregivers can help you with a diet. Generally, eating less food and getting more   exercise will help you lose weight. Joining a weight control group may also help. Ask your caregivers for a good weight control group in your area.  Eat low-fat foods instead of high-fat foods. This can help you lose weight too.  These foods are lower in fat. Eat MORE of these:    Dried beans, peas, and lentils.  Egg whites.  Low-fat cottage cheese.  Fish.  Lean cuts of meat, such as round, sirloin, rump, and flank (cut extra fat off meat you fix).  Whole grain breads, cereals and pasta.  Skim and nonfat dry milk.  Low-fat yogurt.  Poultry without the skin.  Cheese made with skim or part-skim milk, such as mozzarella, parmesan, farmers', ricotta, or pot cheese. These are higher fat foods. Eat LESS of these:   Whole milk and foods made from whole milk, such as American, blue, cheddar, monterey jack, and swiss cheese  High-fat meats, such as luncheon meats, sausages, knockwurst, bratwurst, hot dogs, ribs, corned beef, ground pork, and regular ground beef.  Fried foods. Limit saturated fats in your diet. Substituting unsaturated fat for saturated fat may decrease your blood triglyceride level. You will need to read package labels to know which products contain saturated fats.  These foods are high in saturated fat. Eat LESS of these:   Fried pork skins.  Whole milk.  Skin and fat from poultry.  Palm oil.  Butter.  Shortening.  Cream cheese.  Bacon.  Margarines and baked goods made from listed oils.  Vegetable shortenings.  Chitterlings.  Fat from meats.  Coconut oil.  Palm kernel oil.  Lard.  Cream.  Sour cream.  Fatback.  Coffee whiteners and non-dairy creamers made with these oils.  Cheese made from whole milk. Use unsaturated fats (both polyunsaturated and monounsaturated) moderately. Remember, even though unsaturated fats are better than saturated fats; you still want a diet low in total fat.  These foods are high in unsaturated fat:   Canola oil.  Sunflower oil.  Mayonnaise.  Almonds.  Peanuts.  Pine nuts.  Margarines made with these oils.  Safflower oil.  Olive oil.  Avocados.  Cashews.  Peanut butter.  Sunflower seeds.  Soybean oil.  Peanut  oil.  Olives.  Pecans.  Walnuts.  Pumpkin seeds. Avoid sugar and other high-sugar foods. This will decrease carbohydrates without decreasing other nutrients. Sugar in your food goes rapidly to your blood. When there is excess sugar in your blood, your liver may use it to make more triglycerides. Sugar also contains calories without other important nutrients.  Eat LESS of these:   Sugar, brown sugar, powdered sugar, jam, jelly, preserves, honey, syrup, molasses, pies, candy, cakes, cookies, frosting, pastries, colas, soft drinks, punches, fruit drinks, and regular gelatin.  Avoid alcohol. Alcohol, even more than sugar, may increase blood triglycerides. In addition, alcohol is high in calories and low in nutrients. Ask for sparkling water, or a diet soft drink instead of an alcoholic beverage. Suggestions for planning and preparing meals   Bake, broil, grill or roast meats instead of frying.  Remove fat from meats and skin from poultry before cooking.  Add spices, herbs, lemon juice or vinegar to vegetables instead of salt, rich sauces or gravies.  Use a non-stick skillet without fat or use no-stick sprays.  Cool and refrigerate stews and broth. Then remove the hardened fat floating on the surface before serving.  Refrigerate meat drippings and skim off fat to make low-fat gravies.  Serve more fish.  Use less butter,   margarine and other high-fat spreads on bread or vegetables.  Use skim or reconstituted non-fat dry milk for cooking.  Cook with low-fat cheeses.  Substitute low-fat yogurt or cottage cheese for all or part of the sour cream in recipes for sauces, dips or congealed salads.  Use half yogurt/half mayonnaise in salad recipes.  Substitute evaporated skim milk for cream. Evaporated skim milk or reconstituted non-fat dry milk can be whipped and substituted for whipped cream in certain recipes.  Choose fresh fruits for dessert instead of high-fat foods such as pies or  cakes. Fruits are naturally low in fat. When Dining Out   Order low-fat appetizers such as fruit or vegetable juice, pasta with vegetables or tomato sauce.  Select clear, rather than cream soups.  Ask that dressings and gravies be served on the side. Then use less of them.  Order foods that are baked, broiled, poached, steamed, stir-fried, or roasted.  Ask for margarine instead of butter, and use only a small amount.  Drink sparkling water, unsweetened tea or coffee, or diet soft drinks instead of alcohol or other sweet beverages. QUESTIONS AND ANSWERS ABOUT OTHER FATS IN THE BLOOD: SATURATED FAT, TRANS FAT, AND CHOLESTEROL What is trans fat? Trans fat is a type of fat that is formed when vegetable oil is hardened through a process called hydrogenation. This process helps makes foods more solid, gives them shape, and prolongs their shelf life. Trans fats are also called hydrogenated or partially hydrogenated oils.  What do saturated fat, trans fat, and cholesterol in foods have to do with heart disease? Saturated fat, trans fat, and cholesterol in the diet all raise the level of LDL "bad" cholesterol in the blood. The higher the LDL cholesterol, the greater the risk for coronary heart disease (CHD). Saturated fat and trans fat raise LDL similarly.  What foods contain saturated fat, trans fat, and cholesterol? High amounts of saturated fat are found in animal products, such as fatty cuts of meat, chicken skin, and full-fat dairy products like butter, whole milk, cream, and cheese, and in tropical vegetable oils such as palm, palm kernel, and coconut oil. Trans fat is found in some of the same foods as saturated fat, such as vegetable shortening, some margarines (especially hard or stick margarine), crackers, cookies, baked goods, fried foods, salad dressings, and other processed foods made with partially hydrogenated vegetable oils. Small amounts of trans fat also occur naturally in some animal  products, such as milk products, beef, and lamb. Foods high in cholesterol include liver, other organ meats, egg yolks, shrimp, and full-fat dairy products. How can I use the new food label to make heart-healthy food choices? Check the Nutrition Facts panel of the food label. Choose foods lower in saturated fat, trans fat, and cholesterol. For saturated fat and cholesterol, you can also use the Percent Daily Value (%DV): 5% DV or less is low, and 20% DV or more is high. (There is no %DV for trans fat.) Use the Nutrition Facts panel to choose foods low in saturated fat and cholesterol, and if the trans fat is not listed, read the ingredients and limit products that list shortening or hydrogenated or partially hydrogenated vegetable oil, which tend to be high in trans fat. POINTS TO REMEMBER:   Discuss your risk for heart disease with your caregivers, and take steps to reduce risk factors.  Change your diet. Choose foods that are low in saturated fat, trans fat, and cholesterol.  Add exercise to your daily routine if   it is not already being done. Participate in physical activity of moderate intensity, like brisk walking, for at least 30 minutes on most, and preferably all days of the week. No time? Break the 30 minutes into three, 10-minute segments during the day.  Stop smoking. If you do smoke, contact your caregiver to discuss ways in which they can help you quit.  Do not use street drugs.  Maintain a normal weight.  Maintain a healthy blood pressure.  Keep up with your blood work for checking the fats in your blood as directed by your caregiver. Document Released: 07/18/2004 Document Revised: 03/31/2012 Document Reviewed: 02/13/2009 [employer] Patient Information 2014 [employer].  

## 2013-11-18 NOTE — Progress Notes (Signed)
Patient ID: [name], male   DOB: November 26, 1925, 80 y.o.   MRN: [id number] SUBJECTIVE: CC: Chief Complaint  Patient presents with  . Follow-up    needs refills thyroid does not want to go to [place]    HPI: Still gets a hot flash at night after his medications. Otherwise he is doing well. Wants to get monitored here for his thyroid. It is becoming a problem to journey to [facility] to Dr Chalmers Cater.   Patient is here for follow up of hyperlipidemia/HTN/CAD: denies Headache;denies Chest Pain;denies weakness;denies Shortness of Breath and orthopnea;denies Visual changes;denies palpitations;denies cough;denies pedal edema;denies symptoms of TIA or stroke;deniesClaudication symptoms. admits to Compliance with medications; denies Problems with medications.   Past Medical History  Diagnosis Date  . Dilated cardiomyopathy     Felt to be secondary to atrial fibrillation. EF is about 40%  . Atrial fibrillation     Persistent, treated with amiodarone and cardioversion  . Drug therapy     Chronic Coumadin therapy  . CAD (coronary artery disease)     Last catheterization in August 2008 demonstrated the LAD 40% stenosis  . Hypertension   . Dyslipidemia   . Hypothyroidism     Possibly related to amiodarone   Past Surgical History  Procedure Laterality Date  . Hip surgery      Left  . Cardiac catheterization  05/2007    LAD 40% stenosis. Stent in the LAD had 20% in-stent restenosis. Followed by 40-50% distal stenosis. 50% diagonal stenosis. 40-50% circumflex stenosis. 70% OM1 stenosis and 30% ostial RCA stenosis. 30% proximal RCA stenosis.   History   Social History  . Marital Status: Widowed    Spouse Name: N/A    Number of Children: N/A  . Years of Education: N/A   Occupational History  . Part Time    Social History Main Topics  . Smoking status: Never Smoker   . Smokeless tobacco: Never Used  . Alcohol Use: No  . Drug Use: No  . Sexual Activity: Not on file   Other  Topics Concern  . Not on file   Social History Narrative   Widowed   Gets regular exercise   Family History  Problem Relation Age of Onset  . Heart attack Mother   . Heart attack Father   . Diabetes Other   . Stroke Other    Current Outpatient Prescriptions on File Prior to Visit  Medication Sig Dispense Refill  . amiodarone (PACERONE) 200 MG tablet TAKE 1 TABLET ONCE DAILY FOR HEART  30 tablet  6  . amLODipine (NORVASC) 10 MG tablet TAKE 1 TABLET ONCE A DAY  30 tablet  1  . Calcium Carb-Cholecalciferol (CALCIUM 500 +D PO) Take by mouth.      . carvedilol (COREG) 12.5 MG tablet TAKE  (1)  TABLET TWICE A DAY.  60 tablet  2  . Cyanocobalamin (VITAMIN B-12 CR PO) Take 2,500 mg by mouth daily.      . fenofibrate 120 MG TABS TAKE 1 TABLET DAILY  30 tablet  2  . fish oil-omega-3 fatty acids 1000 MG capsule Take 2 g by mouth daily.      . hydrochlorothiazide (HYDRODIURIL) 25 MG tablet TAKE 1 TABLET DAILY  90 tablet  1  . lisinopril (PRINIVIL,ZESTRIL) 40 MG tablet TAKE 1 TABLET ONCE A DAY FOR HIGH BLOOD PRESSURE  30 tablet  6  . nitroGLYCERIN (NITROSTAT) 0.4 MG SL tablet Place 1 tablet (0.4 mg total) under the tongue every 5 (  five) minutes as needed for chest pain.  25 tablet  1  . NON FORMULARY vilasmart      . warfarin (COUMADIN) 5 MG tablet TAKE 1 TABLET DAILY OR AS DIRECTED  32 tablet  2  . zolpidem (AMBIEN) 5 MG tablet TAKE ONE TABLET AT BEDTIME  30 tablet  1   No current facility-administered medications on file prior to visit.   Allergies  Allergen Reactions  . Penicillins    Immunization History  Administered Date(s) Administered  . Influenza,inj,Quad PF,36+ Mos 07/05/2013  . Tdap 10/14/2008   Prior to Admission medications   Medication Sig Start Date End Date Taking? Authorizing Provider  amiodarone (PACERONE) 200 MG tablet TAKE 1 TABLET ONCE DAILY FOR HEART 05/03/13   Minus Breeding, MD  amLODipine (NORVASC) 10 MG tablet TAKE 1 TABLET ONCE A DAY 11/08/13   Minus Breeding,  MD  Calcium Carb-Cholecalciferol (CALCIUM 500 +D PO) Take by mouth.    Historical Provider, MD  carvedilol (COREG) 12.5 MG tablet TAKE  (1)  TABLET TWICE A DAY. 11/08/13   Vernie Shanks, MD  Cyanocobalamin (VITAMIN B-12 CR PO) Take 2,500 mg by mouth daily.    Historical Provider, MD  fenofibrate 120 MG TABS TAKE 1 TABLET DAILY 11/08/13   Vernie Shanks, MD  fish oil-omega-3 fatty acids 1000 MG capsule Take 2 g by mouth daily.    Historical Provider, MD  hydrochlorothiazide (HYDRODIURIL) 25 MG tablet TAKE 1 TABLET DAILY 06/15/13   Vernie Shanks, MD  levothyroxine (SYNTHROID, LEVOTHROID) 150 MCG tablet Take 150 mcg by mouth daily.      Historical Provider, MD  lisinopril (PRINIVIL,ZESTRIL) 40 MG tablet TAKE 1 TABLET ONCE A DAY FOR HIGH BLOOD PRESSURE 05/13/13   Vernie Shanks, MD  nitroGLYCERIN (NITROSTAT) 0.4 MG SL tablet Place 1 tablet (0.4 mg total) under the tongue every 5 (five) minutes as needed for chest pain. 01/14/13   Vernie Shanks, MD  NON FORMULARY vilasmart    Historical Provider, MD  warfarin (COUMADIN) 5 MG tablet TAKE 1 TABLET DAILY OR AS DIRECTED 09/08/13   Mary-Margaret Hassell Done, FNP  zolpidem (AMBIEN) 5 MG tablet TAKE ONE TABLET AT BEDTIME 09/20/13   Mary-Margaret Martin, FNP     ROS: As above in the HPI. All other systems are stable or negative.  OBJECTIVE: APPEARANCE:  Patient in no acute distress.The patient appeared well nourished and normally developed. Acyanotic. Waist: VITAL SIGNS:BP 126/85  Pulse 55  Temp(Src) 97 F (36.1 C) (Oral)  Ht 5\' 7"  (1.702 m)  Wt 180 lb 6.4 oz (81.829 kg)  BMI 28.25 kg/m2  WM mildly overweight   SKIN: warm and  Dry without overt rashes, tattoos and scars  HEAD and Neck: without JVD, Head and scalp: normal Eyes:No scleral icterus. Fundi normal, eye movements normal. Ears: Auricle normal, canal normal, Tympanic membranes normal, insufflation normal. Nose: normal Throat: normal Neck & thyroid: normal  CHEST & LUNGS: Chest wall:  normal Lungs: Clear  CVS: Reveals the PMI to be normally located. Regular rhythm, First and Second Heart sounds are normal,  absence of murmurs, rubs or gallops. Peripheral vasculature: Radial pulses: normal Dorsal pedis pulses: normal Posterior pulses: normal  ABDOMEN:  Appearance: overweight Benign, no organomegaly, no masses, no Abdominal Aortic enlargement. No Guarding , no rebound. No Bruits. Bowel sounds: normal  RECTAL: N/A GU: N/A  EXTREMETIES: nonedematous.  MUSCULOSKELETAL:  Spine: normal Joints: intact  NEUROLOGIC: oriented to time,place and person; nonfocal. Strength is normal Sensory is normal  Reflexes are normal Cranial Nerves are normal. Results for orders placed in visit on 11/11/13  POCT INR      Result Value Range   INR 2.1      ASSESSMENT: HYPERTHYROIDISM  Atrial fibrillation  Bruit  CARDIOMYOPATHY  DYSLIPIDEMIA  Long term (current) use of anticoagulants  CAD  Hypothyroid - Plan: levothyroxine (SYNTHROID, LEVOTHROID) 150 MCG tablet  PLAN: Handout on Hyperlipidemia in the AVS for patient's self education  Continue present regimen.   No orders of the defined types were placed in this encounter.   Meds ordered this encounter  Medications  . gabapentin (NEURONTIN) 300 MG capsule    Sig: Take 300 mg by mouth at bedtime.  Marland Kitchen levothyroxine (SYNTHROID, LEVOTHROID) 150 MCG tablet    Sig: Take 1 tablet (150 mcg total) by mouth daily.    Dispense:  30 tablet    Refill:  5   Medications Discontinued During This Encounter  Medication Reason  . levothyroxine (SYNTHROID, LEVOTHROID) 150 MCG tablet Reorder   Return in about 3 months (around 02/15/2014) for Recheck medical problems.  Vonda Harth P. Jacelyn Grip, M.D.

## 2013-11-26 ENCOUNTER — Other Ambulatory Visit: Payer: Self-pay | Admitting: Nurse Practitioner

## 2013-11-26 ENCOUNTER — Other Ambulatory Visit: Payer: Self-pay | Admitting: Cardiology

## 2013-11-29 NOTE — Telephone Encounter (Signed)
Last seen 11/18/13  [name]  If approved route to nurse to call into [facility]

## 2013-11-29 NOTE — Telephone Encounter (Signed)
Rx ready for nurse to Phone in. 

## 2013-11-29 NOTE — Telephone Encounter (Signed)
rx called to [facility]  

## 2013-12-07 ENCOUNTER — Other Ambulatory Visit: Payer: Self-pay | Admitting: Nurse Practitioner

## 2013-12-10 ENCOUNTER — Other Ambulatory Visit: Payer: Self-pay | Admitting: Family Medicine

## 2013-12-15 ENCOUNTER — Encounter: Payer: Self-pay | Admitting: Cardiology

## 2013-12-15 ENCOUNTER — Telehealth: Payer: Self-pay | Admitting: Family Medicine

## 2013-12-15 ENCOUNTER — Ambulatory Visit (INDEPENDENT_AMBULATORY_CARE_PROVIDER_SITE_OTHER): Payer: Medicare Other | Admitting: Cardiology

## 2013-12-15 VITALS — BP 154/67 | HR 58 | Ht 68.0 in | Wt 183.0 lb

## 2013-12-15 DIAGNOSIS — I4891 Unspecified atrial fibrillation: Secondary | ICD-10-CM

## 2013-12-15 DIAGNOSIS — I2510 Atherosclerotic heart disease of native coronary artery without angina pectoris: Secondary | ICD-10-CM

## 2013-12-15 DIAGNOSIS — I10 Essential (primary) hypertension: Secondary | ICD-10-CM

## 2013-12-15 NOTE — Progress Notes (Signed)
HPI The patient presents for one-year followup of his known coronary disease. Since I last saw him he has developed some burning in his left greater than right foot and pain. He also has had some staining chest discomfort. He is unusual in that it seems to happen when he's lying down at night very it is moderate. It goes into his shoulders. He does not describe burning in his throat or water brash. He gets up and it goes away. He doesn't seem to have this during the day. He doesn't describe associated symptoms such as nausea vomiting or diaphoresis. He doesn't have any palpitations, presyncope or syncope. It is more on his right than his left side. Of note he's no longer exercising at the [facility] and is not as active as he used to be. He does describe burning in his legs which is relatively new in onset.   Allergies  Allergen Reactions  . Penicillins     Current Outpatient Prescriptions  Medication Sig Dispense Refill  . amiodarone (PACERONE) 200 MG tablet TAKE 1 TABLET ONCE DAILY FOR HEART  30 tablet  0  . amLODipine (NORVASC) 10 MG tablet TAKE 1 TABLET ONCE A DAY  30 tablet  1  . Calcium Carb-Cholecalciferol (CALCIUM 500 +D PO) Take by mouth.      . carvedilol (COREG) 12.5 MG tablet TAKE  (1)  TABLET TWICE A DAY.  60 tablet  2  . Cyanocobalamin (VITAMIN B-12 CR PO) Take 2,500 mg by mouth daily.      . fenofibrate 120 MG TABS TAKE 1 TABLET DAILY  30 tablet  2  . fish oil-omega-3 fatty acids 1000 MG capsule Take 2 g by mouth daily.      Marland Kitchen gabapentin (NEURONTIN) 300 MG capsule Take 300 mg by mouth at bedtime.      . hydrochlorothiazide (HYDRODIURIL) 25 MG tablet TAKE 1 TABLET ONCE A DAY  90 tablet  1  . levothyroxine (SYNTHROID, LEVOTHROID) 150 MCG tablet Take 1 tablet (150 mcg total) by mouth daily.  30 tablet  5  . lisinopril (PRINIVIL,ZESTRIL) 40 MG tablet TAKE 1 TABLET ONCE A DAY FOR HIGH BLOOD PRESSURE  30 tablet  6  . nitroGLYCERIN (NITROSTAT) 0.4 MG SL tablet Place 1 tablet (0.4 mg  total) under the tongue every 5 (five) minutes as needed for chest pain.  25 tablet  1  . NON FORMULARY vilasmart      . warfarin (COUMADIN) 5 MG tablet TAKE 1 TABLET DAILY OR AS DIRECTED  32 tablet  1  . zolpidem (AMBIEN) 5 MG tablet TAKE ONE TABLET AT BEDTIME  30 tablet  0   No current facility-administered medications for this visit.    Past Medical History  Diagnosis Date  . Dilated cardiomyopathy     Felt to be secondary to atrial fibrillation. EF is about 40%  . Atrial fibrillation     Persistent, treated with amiodarone and cardioversion  . Drug therapy     Chronic Coumadin therapy  . CAD (coronary artery disease)     Last catheterization in August 2008 demonstrated the LAD 40% stenosis  . Hypertension   . Dyslipidemia   . Hypothyroidism     Possibly related to amiodarone    Past Surgical History  Procedure Laterality Date  . Hip surgery      Left  . Cardiac catheterization  05/2007    LAD 40% stenosis. Stent in the LAD had 20% in-stent restenosis. Followed by 40-50% distal stenosis. 50%  diagonal stenosis. 40-50% circumflex stenosis. 70% OM1 stenosis and 30% ostial RCA stenosis. 30% proximal RCA stenosis.    ROS:  Positive for reflux.  Otherwise as stated in the HPI and negative for all other systems.  PHYSICAL EXAM BP 154/67  Pulse 58  Ht 5\' 8"  (1.727 m)  Wt 183 lb (83.008 kg)  BMI 27.83 kg/m2 GENERAL:  Well appearing HEENT:  Pupils equal round and reactive, fundi not visualized, oral mucosa unremarkable NECK:  No jugular venous distention, waveform within normal limits, carotid upstroke brisk and symmetric, soft right bruits, no thyromegaly LYMPHATICS:  No cervical, inguinal adenopathy LUNGS:  Clear to auscultation bilaterally BACK:  No CVA tenderness CHEST:  Unremarkable HEART:  PMI not displaced or sustained,S1 and S2 within normal limits, no S3, no S4, no clicks, no rubs, apical early peaking systolic murmur radiating out the aortic outflow tract  slightly ABD:  Flat, positive bowel sounds normal in frequency in pitch, no bruits, no rebound, no guarding, no midline pulsatile mass, no hepatomegaly, no splenomegaly EXT:  2 plus pulses throughout, no edema, no cyanosis no clubbing SKIN:  No rashes no nodules NEURO:  Cranial nerves II through XII grossly intact, motor grossly intact throughout PSYCH:  Cognitively intact, oriented to person place and time  EKG:  Sinus bradycardia, left bundle branch, rate 56, premature ectopic complexes. No change  12/15/2013  ASSESSMENT AND PLAN  ATRIAL FIBRILLATION:  He's had no symptomatic paroxysms of this. He's tolerating amiodarone. He doesn't want to switch to a NOAC as he has no problems with warfarin. He's had appropriate laboratory followup of his amiodarone and I reviewed the liver profile from January and the TSH from December.  CAD:   I think that it is unlikely that his recent symptoms are cardiac.  However it has been several years since his last cardiac study.  He needs stress testing.  With LBBB he will need to have a [employer].  HTN:  His blood pressure is slightly elevated but this is unusual and I reviewed recent readings. No change in therapy is indicated.  HYPERLIPIDEMIA: his is followed closely by Dr. Jacelyn Grip.  No change in therapy is indicated.  CAROTID STENOSIS:  Carotid Doppler late last year demonstrated stable 40-59% bilateral stenosis and we will follow this again in December.  LEG BURNING:  This seems to be new. He is going to see Dr. Jacelyn Grip for evaluation of this soon. If there is not a clear etiology for response to treatment when I see him back on it stop his amiodarone as there is a small chance this is responsible.

## 2013-12-15 NOTE — Patient Instructions (Signed)
The current medical regimen is effective;  continue present plan and medications.  Your physician has requested that you have a lexiscan myoview. For further information please visit [email or web address]. Please follow instruction sheet, as given.  Follow up with Dr Percival Spanish in 3 months.

## 2013-12-16 ENCOUNTER — Ambulatory Visit (INDEPENDENT_AMBULATORY_CARE_PROVIDER_SITE_OTHER): Payer: Medicare Other | Admitting: Pharmacist

## 2013-12-16 DIAGNOSIS — I4891 Unspecified atrial fibrillation: Secondary | ICD-10-CM

## 2013-12-16 LAB — POCT INR: INR: 2.4

## 2013-12-16 NOTE — Patient Instructions (Signed)
Anticoagulation Dose Instructions as of 12/16/2013     Dorene Grebe Tue Wed Thu Fri Sat   New Dose 7.5 mg 5 mg 5 mg 5 mg 7.5 mg 5 mg 5 mg    Description       Continue 7.5mg  on Sundays and Thursdays, 5mg  all other days      INR was 2.4 today

## 2013-12-16 NOTE — Telephone Encounter (Signed)
I called patient to come in early for his protime appt today and he asked that I forward message that he no longer needs to see Dr Jacelyn Grip.  He feels better and has appt for stress test next week.

## 2013-12-28 ENCOUNTER — Other Ambulatory Visit: Payer: Self-pay | Admitting: Family Medicine

## 2013-12-28 ENCOUNTER — Other Ambulatory Visit: Payer: Self-pay | Admitting: Cardiology

## 2013-12-30 ENCOUNTER — Other Ambulatory Visit: Payer: Self-pay | Admitting: Family Medicine

## 2013-12-30 MED ORDER — GABAPENTIN 300 MG PO CAPS: 300.0000 mg | ORAL_CAPSULE | Freq: Every day | ORAL | Status: DC

## 2013-12-30 NOTE — Telephone Encounter (Signed)
Rx ready for nurse to Phone in. 

## 2013-12-30 NOTE — Telephone Encounter (Signed)
Phoned in to pharmacy. 

## 2013-12-30 NOTE — Addendum Note (Signed)
Addended by: Ilean China on: 12/30/2013 03:20 PM   Modules accepted: Orders

## 2013-12-30 NOTE — Telephone Encounter (Signed)
Last office visit 11/18/13. Last refill 11/29/13. If approved have nurse call in to [facility].

## 2014-01-03 ENCOUNTER — Encounter: Payer: Self-pay | Admitting: Cardiology

## 2014-01-03 ENCOUNTER — Ambulatory Visit (HOSPITAL_COMMUNITY): Payer: Medicare Other | Attending: Cardiology | Admitting: Radiology

## 2014-01-03 VITALS — BP 131/62 | Ht 68.0 in | Wt 180.0 lb

## 2014-01-03 DIAGNOSIS — I447 Left bundle-branch block, unspecified: Secondary | ICD-10-CM

## 2014-01-03 DIAGNOSIS — I2510 Atherosclerotic heart disease of native coronary artery without angina pectoris: Secondary | ICD-10-CM

## 2014-01-03 DIAGNOSIS — R079 Chest pain, unspecified: Secondary | ICD-10-CM

## 2014-01-03 DIAGNOSIS — I4891 Unspecified atrial fibrillation: Secondary | ICD-10-CM

## 2014-01-03 DIAGNOSIS — I10 Essential (primary) hypertension: Secondary | ICD-10-CM

## 2014-01-03 MED ORDER — TECHNETIUM TC 99M SESTAMIBI GENERIC - CARDIOLITE
30.0000 | Freq: Once | INTRAVENOUS | Status: AC | PRN
  Administered 2014-01-03: 30 via INTRAVENOUS

## 2014-01-03 MED ORDER — ADENOSINE (DIAGNOSTIC) 3 MG/ML IV SOLN
0.5600 mg/kg | Freq: Once | INTRAVENOUS | Status: AC
  Administered 2014-01-03: 45.6 mg via INTRAVENOUS

## 2014-01-03 MED ORDER — TECHNETIUM TC 99M SESTAMIBI GENERIC - CARDIOLITE
10.0000 | Freq: Once | INTRAVENOUS | Status: AC | PRN
  Administered 2014-01-03: 10 via INTRAVENOUS

## 2014-01-03 NOTE — Progress Notes (Signed)
[facility] [address] [place], [place] [address] [phone]    Cardiology Nuclear Med Study  [name] is a 80 y.o. male     MRN : [id number]     DOB: 1926/01/10  Procedure Date: 01/03/2014  Nuclear Med Background Indication for Stress Test:  Evaluation for Ischemia and Stent Patency History:  Cath-(+) CAD Stent_LAD, '08 ECHO: EF: 40% AFIB with Cardioversion Cardiac Risk Factors: Carotid Disease, Family History - CAD, Hypertension, LBBB and Lipids  Symptoms:  Chest Pain and Palpitations   Nuclear Pre-Procedure Caffeine/Decaff Intake:  None > 12 hrs NPO After: 6:00pm   Lungs: clear clear O2 Sat: 96% on room air. IV 0.9% NS with Angio Cath:  22g  IV Site: R Antecubital x 1, tolerated well IV Started by:  Irven Baltimore, RN  Chest Size (in):  40 Cup Size: n/a  Height: 5\' 8"  (1.727 m)  Weight:  180 lb (81.647 kg)  BMI:  Body mass index is 27.38 kg/(m^2). Tech Comments:  Took Coreg this am    Nuclear Med Study 1 or 2 day study: 1 day  Stress Test Type:  Adenosine  Reading MD: N/A  Order Authorizing Provider:  Minus Breeding, MD  Resting Radionuclide: Technetium 30m Sestamibi  Resting Radionuclide Dose: 11.0 mCi   Stress Radionuclide:  Technetium 60m Sestamibi  Stress Radionuclide Dose: 33.0 mCi           Stress Protocol Rest HR: 47 Stress HR: 55  Rest BP: 131/62 Stress BP: 142/57  Exercise Time (min): n/a METS: n/a   Predicted Max HR: 135 bpm % Max HR: 40.74 bpm Rate Pressure Product: 7810   Dose of Adenosine (mg):  45.8 Dose of Lexiscan: n/a mg  Dose of Atropine (mg): n/a Dose of Dobutamine: n/a mcg/kg/min (at max HR)  Stress Test Technologist: Perrin Maltese, EMT-P  Nuclear Technologist:  Annye Rusk, CNMT     Rest Procedure:  Myocardial perfusion imaging was performed at rest 45 minutes following the intravenous administration of Technetium 37m Sestamibi. Rest ECG: NSR-LBBB  Stress Procedure:  The patient received IV  adenosine at 140 mcg/kg/min for 4 minutes.  This patient had (L) chest tightness with the Adenosine infusion. Technetium 17m Sestamibi was injected at the 2 minute mark and quantitative spect images were obtained after a 45 minute delay. Stress ECG: No significant change from baseline ECG  QPS Raw Data Images:  Normal; no motion artifact; normal heart/lung ratio. Stress Images:  There is decreased uptake in the inferior wall. Rest Images:  There is decreased uptake in the inferior wall. Subtraction (SDS):  Diaphragm/Small bowel artifact Transient Ischemic Dilatation (Normal <1.22):  0.95 Lung/Heart Ratio (Normal <0.45):  0.32  Quantitative Gated Spect Images QGS EDV:  123 ml QGS ESV:  48 ml  Impression Exercise Capacity:  Adenosine study with no exercise. BP Response:  Normal blood pressure response. Clinical Symptoms:  chest tightness ECG Impression:  PVC;s  Comparison with Prior Nuclear Study: No images to compare  Overall Impression:  Low risk stress nuclear study Decreased inferior uptake likely from small bowel artifact no ischemia.  LV Ejection Fraction: 61%.  LV Wall Motion:  Septal motion abnormality   Jenkins Rouge

## 2014-01-11 ENCOUNTER — Other Ambulatory Visit: Payer: Self-pay | Admitting: Cardiology

## 2014-01-27 ENCOUNTER — Inpatient Hospital Stay
Admission: RE | Admit: 2014-01-27 | Discharge: 2014-01-27 | Disposition: A | Discharge status: Home / Self Care | Payer: Self-pay | Source: Ambulatory Visit | Attending: Pharmacist | Admitting: Pharmacist

## 2014-01-27 ENCOUNTER — Other Ambulatory Visit: Payer: Self-pay | Admitting: Family Medicine

## 2014-01-27 ENCOUNTER — Ambulatory Visit (INDEPENDENT_AMBULATORY_CARE_PROVIDER_SITE_OTHER): Payer: Medicare Other | Admitting: Pharmacist

## 2014-01-27 ENCOUNTER — Ambulatory Visit (INDEPENDENT_AMBULATORY_CARE_PROVIDER_SITE_OTHER): Payer: Medicare Other

## 2014-01-27 DIAGNOSIS — Z0000 Encounter for general adult medical examination without abnormal findings: Secondary | ICD-10-CM

## 2014-01-27 DIAGNOSIS — Z79899 Other long term (current) drug therapy: Secondary | ICD-10-CM

## 2014-01-27 DIAGNOSIS — I428 Other cardiomyopathies: Secondary | ICD-10-CM

## 2014-01-27 DIAGNOSIS — I4891 Unspecified atrial fibrillation: Secondary | ICD-10-CM

## 2014-01-27 DIAGNOSIS — I2510 Atherosclerotic heart disease of native coronary artery without angina pectoris: Secondary | ICD-10-CM

## 2014-01-27 LAB — POCT INR: INR: 2.6

## 2014-01-27 NOTE — Patient Instructions (Signed)
Anticoagulation Dose Instructions as of 01/27/2014     [name] Tue Wed Thu Fri Sat   New Dose 7.5 mg 5 mg 5 mg 5 mg 7.5 mg 5 mg 5 mg    Description       Continue 7.5mg  on Sundays and Thursdays, 5mg  all other days      INR was 2.6 today

## 2014-01-31 ENCOUNTER — Other Ambulatory Visit: Payer: Self-pay | Admitting: Family Medicine

## 2014-01-31 ENCOUNTER — Other Ambulatory Visit: Payer: Self-pay | Admitting: Nurse Practitioner

## 2014-02-01 NOTE — Telephone Encounter (Signed)
Last seen 11/18/13  FPW  If approved route to nurse to call into [facility]

## 2014-02-03 NOTE — Telephone Encounter (Signed)
Rx ready for nurse to Phone in. 

## 2014-02-03 NOTE — Telephone Encounter (Signed)
Called in.

## 2014-02-07 ENCOUNTER — Other Ambulatory Visit: Payer: Self-pay | Admitting: Family Medicine

## 2014-02-17 ENCOUNTER — Encounter: Payer: Self-pay | Admitting: Family Medicine

## 2014-02-17 ENCOUNTER — Ambulatory Visit (INDEPENDENT_AMBULATORY_CARE_PROVIDER_SITE_OTHER): Payer: Medicare Other | Admitting: Family Medicine

## 2014-02-17 ENCOUNTER — Ambulatory Visit (INDEPENDENT_AMBULATORY_CARE_PROVIDER_SITE_OTHER): Payer: Medicare Other

## 2014-02-17 VITALS — BP 139/56 | HR 47 | Temp 97.0°F | Ht 68.0 in | Wt 184.4 lb

## 2014-02-17 DIAGNOSIS — I2510 Atherosclerotic heart disease of native coronary artery without angina pectoris: Secondary | ICD-10-CM

## 2014-02-17 DIAGNOSIS — R0989 Other specified symptoms and signs involving the circulatory and respiratory systems: Secondary | ICD-10-CM

## 2014-02-17 DIAGNOSIS — R209 Unspecified disturbances of skin sensation: Secondary | ICD-10-CM

## 2014-02-17 DIAGNOSIS — R202 Paresthesia of skin: Secondary | ICD-10-CM

## 2014-02-17 DIAGNOSIS — I4891 Unspecified atrial fibrillation: Secondary | ICD-10-CM

## 2014-02-17 DIAGNOSIS — Z7901 Long term (current) use of anticoagulants: Secondary | ICD-10-CM

## 2014-02-17 DIAGNOSIS — E039 Hypothyroidism, unspecified: Secondary | ICD-10-CM

## 2014-02-17 DIAGNOSIS — E785 Hyperlipidemia, unspecified: Secondary | ICD-10-CM

## 2014-02-17 DIAGNOSIS — I428 Other cardiomyopathies: Secondary | ICD-10-CM

## 2014-02-17 NOTE — Progress Notes (Signed)
Patient ID: [name], male   DOB: 1926-02-11, 80 y.o.   MRN: [id number] SUBJECTIVE: CC: Chief Complaint  Patient presents with  . Hypothyroidism    follow up  . Hypertension    HPI: Patient is here for follow up of hyperlipidemia/HTN/CAD/A Fib/: denies Headache;denies Chest Pain;denies weakness;denies Shortness of Breath and orthopnea;denies Visual changes;denies palpitations;denies cough;denies pedal edema;denies symptoms of TIA or stroke;deniesClaudication symptoms. admits to Compliance with medications; denies Problems with medications.  Has numbness of both legs from the knees down This has been going on for years used to see the neurologist in [place]. Has not seen them in years. Has been on gabapentin. Occurs twice a week now. Was worse in the past.  Past Medical History  Diagnosis Date  . Dilated cardiomyopathy     Felt to be secondary to atrial fibrillation. EF is about 40%  . Atrial fibrillation     Persistent, treated with amiodarone and cardioversion  . Drug therapy     Chronic Coumadin therapy  . CAD (coronary artery disease)     Last catheterization in August 2008 demonstrated the LAD 40% stenosis  . Hypertension   . Dyslipidemia   . Hypothyroidism     Possibly related to amiodarone   Past Surgical History  Procedure Laterality Date  . Hip surgery      Left  . Cardiac catheterization  05/2007    LAD 40% stenosis. Stent in the LAD had 20% in-stent restenosis. Followed by 40-50% distal stenosis. 50% diagonal stenosis. 40-50% circumflex stenosis. 70% OM1 stenosis and 30% ostial RCA stenosis. 30% proximal RCA stenosis.   History   Social History  . Marital Status: Widowed    Spouse Name: N/A    Number of Children: N/A  . Years of Education: N/A   Occupational History  . Part Time    Social History Main Topics  . Smoking status: Never Smoker   . Smokeless tobacco: Never Used  . Alcohol Use: No  . Drug Use: No  . Sexual Activity: Not on file    Other Topics Concern  . Not on file   Social History Narrative   Widowed   Gets regular exercise   Family History  Problem Relation Age of Onset  . Adopted: Yes  . Heart attack Mother   . Heart attack Father   . Diabetes Other   . Stroke Other    Current Outpatient Prescriptions on File Prior to Visit  Medication Sig Dispense Refill  . amiodarone (PACERONE) 200 MG tablet TAKE 1 TABLET ONCE DAILY FOR HEART  30 tablet  3  . amLODipine (NORVASC) 10 MG tablet TAKE 1 TABLET ONCE A DAY  30 tablet  3  . Calcium Carb-Cholecalciferol (CALCIUM 500 +D PO) Take by mouth.      . carvedilol (COREG) 12.5 MG tablet TAKE (1) TABLET TWICE A DAY.  60 tablet  3  . Cyanocobalamin (VITAMIN B-12 CR PO) Take 2,500 mg by mouth daily.      . fenofibrate 120 MG TABS TAKE 1 TABLET DAILY  30 tablet  2  . fish oil-omega-3 fatty acids 1000 MG capsule Take 2 g by mouth daily.      Marland Kitchen gabapentin (NEURONTIN) 300 MG capsule Take 1 capsule (300 mg total) by mouth at bedtime.  30 capsule  2  . hydrochlorothiazide (HYDRODIURIL) 25 MG tablet TAKE 1 TABLET ONCE A DAY  90 tablet  1  . levothyroxine (SYNTHROID, LEVOTHROID) 150 MCG tablet Take 1 tablet (150 mcg  total) by mouth daily.  30 tablet  5  . lisinopril (PRINIVIL,ZESTRIL) 40 MG tablet TAKE 1 TABLET ONCE A DAY FOR HIGH BLOOD PRESSURE  30 tablet  6  . nitroGLYCERIN (NITROSTAT) 0.4 MG SL tablet Place 1 tablet (0.4 mg total) under the tongue every 5 (five) minutes as needed for chest pain.  25 tablet  1  . warfarin (COUMADIN) 5 MG tablet TAKE 1 TABLET DAILY OR AS DIRECTED  32 tablet  1  . zolpidem (AMBIEN) 5 MG tablet TAKE ONE TABLET AT BEDTIME  30 tablet  0  . fenofibrate micronized (ANTARA) 130 MG capsule TAKE (1) CAPSULE DAILY  30 capsule  3  . NON FORMULARY vilasmart       No current facility-administered medications on file prior to visit.   Allergies  Allergen Reactions  . Penicillins    Immunization History  Administered Date(s) Administered  .  Influenza,inj,Quad PF,36+ Mos 07/05/2013  . Pneumococcal Polysaccharide-23 08/17/1995  . Tdap 10/14/2008   Prior to Admission medications   Medication Sig Start Date End Date Taking? Authorizing Provider  amiodarone (PACERONE) 200 MG tablet TAKE 1 TABLET ONCE DAILY FOR HEART 12/28/13   Rollene Rotunda, MD  amLODipine (NORVASC) 10 MG tablet TAKE 1 TABLET ONCE A DAY 01/11/14   Rollene Rotunda, MD  Calcium Carb-Cholecalciferol (CALCIUM 500 +D PO) Take by mouth.    Historical Provider, MD  carvedilol (COREG) 12.5 MG tablet TAKE (1) TABLET TWICE A DAY.    Ileana Ladd, MD  Cyanocobalamin (VITAMIN B-12 CR PO) Take 2,500 mg by mouth daily.    Historical Provider, MD  fenofibrate 120 MG TABS TAKE 1 TABLET DAILY 11/08/13   Ileana Ladd, MD  fenofibrate micronized (ANTARA) 130 MG capsule TAKE (1) CAPSULE DAILY    Ileana Ladd, MD  fish oil-omega-3 fatty acids 1000 MG capsule Take 2 g by mouth daily.    Historical Provider, MD  gabapentin (NEURONTIN) 300 MG capsule Take 1 capsule (300 mg total) by mouth at bedtime. 12/30/13   Ileana Ladd, MD  hydrochlorothiazide (HYDRODIURIL) 25 MG tablet TAKE 1 TABLET ONCE A DAY    Ileana Ladd, MD  levothyroxine (SYNTHROID, LEVOTHROID) 150 MCG tablet Take 1 tablet (150 mcg total) by mouth daily. 11/18/13   Ileana Ladd, MD  lisinopril (PRINIVIL,ZESTRIL) 40 MG tablet TAKE 1 TABLET ONCE A DAY FOR HIGH BLOOD PRESSURE 05/13/13   Ileana Ladd, MD  nitroGLYCERIN (NITROSTAT) 0.4 MG SL tablet Place 1 tablet (0.4 mg total) under the tongue every 5 (five) minutes as needed for chest pain. 01/14/13   Ileana Ladd, MD  NON FORMULARY vilasmart    Historical Provider, MD  warfarin (COUMADIN) 5 MG tablet TAKE 1 TABLET DAILY OR AS DIRECTED    Ileana Ladd, MD  zolpidem (AMBIEN) 5 MG tablet TAKE ONE TABLET AT BEDTIME    Ileana Ladd, MD     ROS: As above in the HPI. All other systems are stable or negative.  OBJECTIVE: APPEARANCE:  Patient in no acute distress.The  patient appeared well nourished and normally developed. Acyanotic. Waist: VITAL SIGNS:BP 139/56  Pulse 47  Temp(Src) 97 F (36.1 C) (Oral)  Ht 5\' 8"  (1.727 m)  Wt 184 lb 6.4 oz (83.643 kg)  BMI 28.04 kg/m2 WM  SKIN: warm and  Dry without overt rashes, tattoos and scars  HEAD and Neck: without JVD, Head and scalp: normal Eyes:No scleral icterus. Fundi normal, eye movements normal. Ears: Auricle normal, canal  normal, Tympanic membranes normal, insufflation normal. Nose: normal Throat: normal Neck & thyroid: normal  CHEST & LUNGS: Chest wall: normal Lungs: Clear  CVS: Reveals the PMI to be normally located. Regular rhythm, First and Second Heart sounds are normal,  absence of murmurs, rubs or gallops. Peripheral vasculature: Radial pulses: normal Dorsal pedis pulses: diminished but present Posterior pulses: diminished but present  ABDOMEN:  Appearance: normal Benign, no organomegaly, no masses, no Abdominal Aortic enlargement. No Guarding , no rebound. No Bruits. Bowel sounds: normal  RECTAL: N/A GU: N/A  EXTREMETIES: nonedematous.  MUSCULOSKELETAL:  Spine: normal Joints: intact  NEUROLOGIC: oriented to time,place and person; nonfocal. Strength is normal Sensory is normal Reflexes are normal Cranial Nerves are normal.  ASSESSMENT:  Long term (current) use of anticoagulants  DYSLIPIDEMIA - Plan: CMP14+EGFR, NMR, lipoprofile  CARDIOMYOPATHY  CAD  Bruit  Atrial fibrillation  Hypothyroid  Paresthesias - peripheral neuropathy - Plan: Vit D  25 hydroxy (rtn osteoporosis monitoring), Vitamin B12, TSH, Folate, RPR, DG Lumbar Spine Complete  PLAN: Dash diet in the AVS. Recommend re-evaluation with neurology and /or NCS but patient declines. Will work up labs. Continue present regimen. Orders Placed This Encounter  Procedures  . DG Lumbar Spine Complete    Standing Status: Future     Number of Occurrences: 1     Standing Expiration Date: 04/20/2015     Order Specific Question:  Reason for Exam (SYMPTOM  OR DIAGNOSIS REQUIRED)    Answer:  both legs burn; h/o spinal stenosis    Order Specific Question:  Preferred imaging location?    Answer:  Internal  . CMP14+EGFR  . NMR, lipoprofile  . Vit D  25 hydroxy (rtn osteoporosis monitoring)  . Vitamin B12  . TSH  . Folate  . RPR  [facility] reading (PRIMARY) by  Dr. Jacelyn Grip: chronic changes with calcification of the descending aorta.no aneurysmal dilatation. L5-S1 degenerative disk dis with foraminal narrowing.                               No orders of the defined types were placed in this encounter.   There are no discontinued medications. Return in about 3 months (around 05/20/2014) for Recheck medical problems.  Geryl Dohn P. Jacelyn Grip, M.D.

## 2014-02-17 NOTE — Patient Instructions (Signed)
DASH Diet  The DASH diet stands for "Dietary Approaches to Stop Hypertension." It is a healthy eating plan that has been shown to reduce high blood pressure (hypertension) in as little as 14 days, while also possibly providing other significant health benefits. These other health benefits include reducing the risk of breast cancer after menopause and reducing the risk of type 2 diabetes, heart disease, colon cancer, and stroke. Health benefits also include weight loss and slowing kidney failure in patients with chronic kidney disease.   DIET GUIDELINES  · Limit salt (sodium). Your diet should contain less than 1500 mg of sodium daily.  · Limit refined or processed carbohydrates. Your diet should include mostly whole grains. Desserts and added sugars should be used sparingly.  · Include small amounts of heart-healthy fats. These types of fats include nuts, oils, and tub margarine. Limit saturated and trans fats. These fats have been shown to be harmful in the body.  CHOOSING FOODS   The following food groups are based on a 2000 calorie diet. See your Registered Dietitian for individual calorie needs.  Grains and Grain Products (6 to 8 servings daily)  · Eat More Often: Whole-wheat bread, brown rice, whole-grain or wheat pasta, quinoa, popcorn without added fat or salt (air popped).  · Eat Less Often: White bread, white pasta, white rice, cornbread.  Vegetables (4 to 5 servings daily)  · Eat More Often: Fresh, frozen, and canned vegetables. Vegetables may be raw, steamed, roasted, or grilled with a minimal amount of fat.  · Eat Less Often/Avoid: Creamed or fried vegetables. Vegetables in a cheese sauce.  Fruit (4 to 5 servings daily)  · Eat More Often: All fresh, canned (in natural juice), or frozen fruits. Dried fruits without added sugar. One hundred percent fruit juice (½ cup [237 mL] daily).  · Eat Less Often: Dried fruits with added sugar. Canned fruit in light or heavy syrup.  Lean Meats, Fish, and Poultry (2  servings or less daily. One serving is 3 to 4 oz [85-114 g]).  · Eat More Often: Ninety percent or leaner ground beef, tenderloin, sirloin. Round cuts of beef, chicken breast, turkey breast. All fish. Grill, bake, or broil your meat. Nothing should be fried.  · Eat Less Often/Avoid: Fatty cuts of meat, turkey, or chicken leg, thigh, or wing. Fried cuts of meat or fish.  Dairy (2 to 3 servings)  · Eat More Often: Low-fat or fat-free milk, low-fat plain or light yogurt, reduced-fat or part-skim cheese.  · Eat Less Often/Avoid: Milk (whole, 2%). Whole milk yogurt. Full-fat cheeses.  Nuts, Seeds, and Legumes (4 to 5 servings per week)  · Eat More Often: All without added salt.  · Eat Less Often/Avoid: Salted nuts and seeds, canned beans with added salt.  Fats and Sweets (limited)  · Eat More Often: Vegetable oils, tub margarines without trans fats, sugar-free gelatin. Mayonnaise and salad dressings.  · Eat Less Often/Avoid: Coconut oils, palm oils, butter, stick margarine, cream, half and half, cookies, candy, pie.  FOR MORE INFORMATION  The Dash Diet Eating Plan: www.dashdiet.org  Document Released: 09/19/2011 Document Revised: 12/23/2011 Document Reviewed: 09/19/2011  [employer]® Patient Information ©2014 [employer].

## 2014-02-18 LAB — CMP14+EGFR
ALT: 23 IU/L (ref 0–44)
AST: 30 IU/L (ref 0–40)
Albumin/Globulin Ratio: 2.2 (ref 1.1–2.5)
Albumin: 4.6 g/dL (ref 3.5–4.7)
Alkaline Phosphatase: 44 IU/L (ref 39–117)
BUN/Creatinine Ratio: 18 (ref 10–22)
BUN: 28 mg/dL — ABNORMAL HIGH (ref 8–27)
CO2: 20 mmol/L (ref 18–29)
Calcium: 9.1 mg/dL (ref 8.6–10.2)
Chloride: 102 mmol/L (ref 97–108)
Creatinine, Ser: 1.52 mg/dL — ABNORMAL HIGH (ref 0.76–1.27)
GFR calc Af Amer: 47 mL/min/{1.73_m2} — ABNORMAL LOW (ref >59)
GFR calc non Af Amer: 41 mL/min/{1.73_m2} — ABNORMAL LOW (ref >59)
Globulin, Total: 2.1 g/dL (ref 1.5–4.5)
Glucose: 122 mg/dL — ABNORMAL HIGH (ref 65–99)
Potassium: 4.1 mmol/L (ref 3.5–5.2)
Sodium: 140 mmol/L (ref 134–144)
Total Bilirubin: 0.5 mg/dL (ref 0.0–1.2)
Total Protein: 6.7 g/dL (ref 6.0–8.5)

## 2014-02-18 LAB — NMR, LIPOPROFILE
Cholesterol: 197 mg/dL (ref <200)
HDL Cholesterol by NMR: 27 mg/dL — ABNORMAL LOW (ref >=40)
HDL Particle Number: 26.7 umol/L — ABNORMAL LOW (ref >=30.5)
LDL Particle Number: 1610 nmol/L — ABNORMAL HIGH (ref <1000)
LDL Size: 19.9 nm (ref >20.5)
LDLC SERPL CALC-MCNC: 107 mg/dL — ABNORMAL HIGH (ref <100)
LP-IR Score: 64 — ABNORMAL HIGH (ref <=45)
Small LDL Particle Number: 1176 nmol/L — ABNORMAL HIGH (ref <=527)
Triglycerides by NMR: 317 mg/dL — ABNORMAL HIGH (ref <150)

## 2014-02-18 LAB — TSH: TSH: 1.2 u[IU]/mL (ref 0.450–4.500)

## 2014-02-18 LAB — VITAMIN D 25 HYDROXY (VIT D DEFICIENCY, FRACTURES): Vit D, 25-Hydroxy: 36.1 ng/mL (ref 30.0–100.0)

## 2014-02-18 LAB — RPR: RPR: NONREACTIVE

## 2014-02-18 LAB — VITAMIN B12: Vitamin B-12: 500 pg/mL (ref 211–946)

## 2014-02-18 LAB — FOLATE: Folate: >19.9 ng/mL (ref >3.0)

## 2014-02-18 NOTE — Progress Notes (Signed)
Quick Note:  Need to see Clinical Pharmacist/Tammy for patient education, medication review and adjustment to achieve goals.lipids too high. Chronic kidney disease is fluctuating and needs to be rechecked in a few weeks. ______

## 2014-02-21 ENCOUNTER — Telehealth: Payer: Self-pay | Admitting: Family Medicine

## 2014-02-21 NOTE — Telephone Encounter (Signed)
Message copied by Waverly Ferrari on Mon Feb 21, 2014 10:22 AM ------      Message from: Vernie Shanks      Created: Fri Feb 18, 2014 10:26 AM       Need to see  Clinical Pharmacist/Tammy for patient education, medication review and adjustment to achieve goals.lipids too high.      Chronic kidney disease is fluctuating and needs to be rechecked in a few weeks. ------

## 2014-03-10 ENCOUNTER — Ambulatory Visit (INDEPENDENT_AMBULATORY_CARE_PROVIDER_SITE_OTHER): Payer: Medicare Other | Admitting: Pharmacist

## 2014-03-10 DIAGNOSIS — I4891 Unspecified atrial fibrillation: Secondary | ICD-10-CM

## 2014-03-10 LAB — POCT INR: INR: 3

## 2014-03-10 NOTE — Patient Instructions (Signed)
Anticoagulation Dose Instructions as of 03/10/2014     [name] Tue Wed Thu Fri Sat   New Dose 7.5 mg 5 mg 5 mg 5 mg 7.5 mg 5 mg 5 mg    Description       Continue current warfarin 7.5mg  on Sundays and Thursdays, 5mg  all other days.      INR was 3.0 today

## 2014-03-11 ENCOUNTER — Other Ambulatory Visit: Payer: Self-pay | Admitting: *Deleted

## 2014-03-11 MED ORDER — ZOLPIDEM TARTRATE 5 MG PO TABS: ORAL_TABLET | ORAL | Status: DC

## 2014-03-11 NOTE — Telephone Encounter (Signed)
Last seen Jacelyn Grip 02/17/14, last filled 02/03/14. Route to pool A to be called in at [facility]

## 2014-03-11 NOTE — Telephone Encounter (Signed)
This is okay to refill 

## 2014-03-14 NOTE — Telephone Encounter (Signed)
Called in.

## 2014-03-23 ENCOUNTER — Ambulatory Visit: Payer: Medicare Other | Admitting: Cardiology

## 2014-03-31 ENCOUNTER — Other Ambulatory Visit: Payer: Self-pay | Admitting: *Deleted

## 2014-03-31 MED ORDER — WARFARIN SODIUM 5 MG PO TABS: ORAL_TABLET | ORAL | Status: DC

## 2014-04-07 ENCOUNTER — Other Ambulatory Visit: Payer: Self-pay | Admitting: *Deleted

## 2014-04-07 MED ORDER — GABAPENTIN 300 MG PO CAPS: 300.0000 mg | ORAL_CAPSULE | Freq: Every day | ORAL | Status: DC

## 2014-04-08 ENCOUNTER — Ambulatory Visit (INDEPENDENT_AMBULATORY_CARE_PROVIDER_SITE_OTHER): Payer: Medicare Other | Admitting: Cardiology

## 2014-04-08 ENCOUNTER — Encounter: Payer: Self-pay | Admitting: *Deleted

## 2014-04-08 ENCOUNTER — Encounter: Payer: Self-pay | Admitting: Cardiology

## 2014-04-08 VITALS — BP 132/59 | HR 58 | Ht 70.0 in | Wt 177.0 lb

## 2014-04-08 DIAGNOSIS — I2510 Atherosclerotic heart disease of native coronary artery without angina pectoris: Secondary | ICD-10-CM

## 2014-04-08 DIAGNOSIS — I658 Occlusion and stenosis of other precerebral arteries: Secondary | ICD-10-CM

## 2014-04-08 DIAGNOSIS — I480 Paroxysmal atrial fibrillation: Secondary | ICD-10-CM

## 2014-04-08 DIAGNOSIS — I4891 Unspecified atrial fibrillation: Secondary | ICD-10-CM

## 2014-04-08 DIAGNOSIS — I6529 Occlusion and stenosis of unspecified carotid artery: Secondary | ICD-10-CM

## 2014-04-08 DIAGNOSIS — I10 Essential (primary) hypertension: Secondary | ICD-10-CM

## 2014-04-08 DIAGNOSIS — I6523 Occlusion and stenosis of bilateral carotid arteries: Secondary | ICD-10-CM

## 2014-04-08 DIAGNOSIS — I428 Other cardiomyopathies: Secondary | ICD-10-CM

## 2014-04-08 NOTE — Patient Instructions (Signed)
Please stop Antara. Continue all other medications as listed.  You are due for your carotid doppler study and this will be scheduled for you.  Follow up in 1 year with Dr Percival Spanish.  You will receive a letter in the mail 2 months before you are due.  Please call us when you receive this letter to schedule your follow up appointment.

## 2014-04-08 NOTE — Progress Notes (Signed)
HPI The patient presents for one-year followup of his known coronary disease. He was having some atypical symptoms previously. I sent him for a stress perfusion study which demonstrated an EF of 61% and some decreased perfusion probably related to bowel uptake. He has had no new symptoms since I saw him. He does not have any chest pressure, neck or arm discomfort. He's not having any palpitations, presyncope or syncope. He has had no PND or orthopnea. He continues to have some leg burning.   Allergies  Allergen Reactions  . Penicillins     Current Outpatient Prescriptions  Medication Sig Dispense Refill  . amiodarone (PACERONE) 200 MG tablet TAKE 1 TABLET ONCE DAILY FOR HEART  30 tablet  3  . amLODipine (NORVASC) 10 MG tablet TAKE 1 TABLET ONCE A DAY  30 tablet  3  . Calcium Carb-Cholecalciferol (CALCIUM 500 +D PO) Take by mouth.      . carvedilol (COREG) 12.5 MG tablet TAKE (1) TABLET TWICE A DAY.  60 tablet  3  . Cyanocobalamin (VITAMIN B-12 CR PO) Take 2,500 mg by mouth daily.      . fenofibrate 120 MG TABS TAKE 1 TABLET DAILY  30 tablet  2  . fenofibrate micronized (ANTARA) 130 MG capsule TAKE (1) CAPSULE DAILY  30 capsule  3  . gabapentin (NEURONTIN) 300 MG capsule Take 1 capsule (300 mg total) by mouth at bedtime.  90 capsule  0  . hydrochlorothiazide (HYDRODIURIL) 25 MG tablet TAKE 1 TABLET ONCE A DAY  90 tablet  1  . levothyroxine (SYNTHROID, LEVOTHROID) 150 MCG tablet Take 1 tablet (150 mcg total) by mouth daily.  30 tablet  5  . lisinopril (PRINIVIL,ZESTRIL) 40 MG tablet TAKE 1 TABLET ONCE A DAY FOR HIGH BLOOD PRESSURE  30 tablet  6  . nitroGLYCERIN (NITROSTAT) 0.4 MG SL tablet Place 1 tablet (0.4 mg total) under the tongue every 5 (five) minutes as needed for chest pain.  25 tablet  1  . NON FORMULARY vilasmart      . warfarin (COUMADIN) 5 MG tablet TAKE 1 TABLET DAILY OR AS DIRECTED  32 tablet  2  . fish oil-omega-3 fatty acids 1000 MG capsule Take 2 g by mouth daily.      Marland Kitchen  zolpidem (AMBIEN) 5 MG tablet TAKE ONE TABLET AT BEDTIME  30 tablet  1   No current facility-administered medications for this visit.    Past Medical History  Diagnosis Date  . Dilated cardiomyopathy     Felt to be secondary to atrial fibrillation. EF is about 40%  . Atrial fibrillation     Persistent, treated with amiodarone and cardioversion  . Drug therapy     Chronic Coumadin therapy  . CAD (coronary artery disease)     Last catheterization in August 2008 demonstrated the LAD 40% stenosis  . Hypertension   . Dyslipidemia   . Hypothyroidism     Possibly related to amiodarone    Past Surgical History  Procedure Laterality Date  . Hip surgery      Left  . Cardiac catheterization  05/2007    LAD 40% stenosis. Stent in the LAD had 20% in-stent restenosis. Followed by 40-50% distal stenosis. 50% diagonal stenosis. 40-50% circumflex stenosis. 70% OM1 stenosis and 30% ostial RCA stenosis. 30% proximal RCA stenosis.    ROS:  Neuropathy.  Otherwise as stated in the HPI and negative for all other systems.  PHYSICAL EXAM BP 132/59  Pulse 58  Ht 5'  10" (1.778 m)  Wt 177 lb (80.287 kg)  BMI 25.40 kg/m2 GENERAL:  Well appearing NECK:  No jugular venous distention, waveform within normal limits, carotid upstroke brisk and symmetric, soft right bruits, no thyromegaly LUNGS:  Clear to auscultation bilaterally CHEST:  Unremarkable HEART:  PMI not displaced or sustained,S1 and S2 within normal limits, no S3, no S4, no clicks, no rubs, apical early peaking systolic murmur radiating out the aortic outflow tract slightly ABD:  Flat, positive bowel sounds normal in frequency in pitch, no bruits, no rebound, no guarding, no midline pulsatile mass, no hepatomegaly, no splenomegaly EXT:  2 plus pulses throughout, no edema, no cyanosis no clubbing SKIN:  No rashes no nodules, multiple bruises and cuts   EKG:  Sinus bradycardia, left bundle branch, rate 55, premature ectopic complexes. No  change  04/08/2014  ASSESSMENT AND PLAN  ATRIAL FIBRILLATION:  He's had no symptomatic paroxysms of this. He's tolerating amiodarone. He doesn't want to switch to a NOAC as he has no problems with warfarin. He's had appropriate laboratory followup of his amiodarone and I reviewed the liver profile from May.  He should have this done twice yearly  CAD:   He has had no new symptoms since the [facility]. No further testing is indicated.   HTN:  His blood pressure is slightly elevated but this is unusual and I reviewed recent readings. No change in therapy is indicated.  HYPERLIPIDEMIA:   I will defer follow up to his primary provider.  He needs to reestablish at [facility] for a new provider. Of note he was taking both generic and branded fenofibrate and we stopped the branded.  CAROTID STENOSIS:  Carotid Doppler demonstrated stable 40-59% bilateral stenosis.  He is overdue for follow up.  I will arrange follow up.   LEG BURNING:  I have referred him back to his primary provider to discuss possible up titration of his gabapentin.

## 2014-04-14 ENCOUNTER — Ambulatory Visit (HOSPITAL_COMMUNITY): Payer: Medicare Other | Attending: Cardiology | Admitting: Radiology

## 2014-04-14 DIAGNOSIS — R0989 Other specified symptoms and signs involving the circulatory and respiratory systems: Secondary | ICD-10-CM | POA: Insufficient documentation

## 2014-04-14 DIAGNOSIS — I10 Essential (primary) hypertension: Secondary | ICD-10-CM | POA: Insufficient documentation

## 2014-04-14 DIAGNOSIS — I6523 Occlusion and stenosis of bilateral carotid arteries: Secondary | ICD-10-CM

## 2014-04-14 DIAGNOSIS — I6529 Occlusion and stenosis of unspecified carotid artery: Secondary | ICD-10-CM | POA: Insufficient documentation

## 2014-04-14 DIAGNOSIS — I658 Occlusion and stenosis of other precerebral arteries: Secondary | ICD-10-CM | POA: Insufficient documentation

## 2014-04-14 DIAGNOSIS — E785 Hyperlipidemia, unspecified: Secondary | ICD-10-CM | POA: Insufficient documentation

## 2014-04-14 NOTE — Progress Notes (Signed)
Carotid duplex performed 

## 2014-04-18 ENCOUNTER — Ambulatory Visit (INDEPENDENT_AMBULATORY_CARE_PROVIDER_SITE_OTHER): Payer: Medicare Other | Admitting: Pharmacist

## 2014-04-18 DIAGNOSIS — R791 Abnormal coagulation profile: Secondary | ICD-10-CM

## 2014-04-18 DIAGNOSIS — I4891 Unspecified atrial fibrillation: Secondary | ICD-10-CM

## 2014-04-18 LAB — POCT CBC
Granulocyte percent: 80.9 %G — AB (ref 37–80)
HEMATOCRIT: 37.5 % — AB (ref 43.5–53.7)
HEMOGLOBIN: 12.2 g/dL — AB (ref 14.1–18.1)
Lymph, poc: 0.9 (ref 0.6–3.4)
MCH, POC: 27.2 pg (ref 27–31.2)
MCHC: 32.6 g/dL (ref 31.8–35.4)
MCV: 83.5 fL (ref 80–97)
MPV: 7.4 fL (ref 0–99.8)
POC LYMPH %: 16.7 % (ref 10–50)
Platelet Count, POC: 320 10*3/uL (ref 142–424)
RBC: 4.5 M/uL — AB (ref 4.69–6.13)
RDW, POC: 14.1 %
WBC: 5.6 10*3/uL (ref 4.6–10.2)

## 2014-04-18 LAB — POCT INR: INR: 6

## 2014-04-18 NOTE — Patient Instructions (Signed)
Anticoagulation Dose Instructions as of 04/18/2014     [name] Tue Wed Thu Fri Sat   New Dose 2.5 mg 5 mg Hold Hold 5 mg 5 mg 5 mg   Alt Week 2.5 mg 5 mg 5 mg 5 mg 5 mg 5 mg 5 mg    Description       No warfarin tomorrow or Wednesday.  Then decrease dose to 1 tablet daily except 1/2 tablet on Sundays.      INR was 6.0 today (too thin)

## 2014-04-18 NOTE — Progress Notes (Signed)
Patient's HGB was lower than his baseline - sent home hemocult cards.  Recheck CBC at visit 04/21/14

## 2014-04-21 ENCOUNTER — Ambulatory Visit (INDEPENDENT_AMBULATORY_CARE_PROVIDER_SITE_OTHER): Payer: Medicare Other | Admitting: Pharmacist

## 2014-04-21 DIAGNOSIS — I4891 Unspecified atrial fibrillation: Secondary | ICD-10-CM

## 2014-04-21 LAB — POCT INR: INR: 2.2

## 2014-04-21 NOTE — Patient Instructions (Signed)
Anticoagulation Dose Instructions as of 04/21/2014     [name] Tue Wed Thu Fri Sat   New Dose 5 mg 5 mg 5 mg 5 mg 5 mg 5 mg 5 mg    Description       Decrease warfarin dose to 1 tablet daily.      INR was 2.2 today

## 2014-04-21 NOTE — Addendum Note (Signed)
Addended by: Selmer Dominion on: 04/21/2014 12:40 PM   Modules accepted: Orders

## 2014-04-22 LAB — FECAL OCCULT BLOOD, IMMUNOCHEMICAL: FECAL OCCULT BLD: NEGATIVE

## 2014-04-25 ENCOUNTER — Telehealth: Payer: Self-pay | Admitting: Pharmacist

## 2014-04-25 NOTE — Telephone Encounter (Signed)
FOBT - negative.  Patient notified.

## 2014-04-26 ENCOUNTER — Other Ambulatory Visit: Payer: Self-pay | Admitting: Cardiology

## 2014-05-09 ENCOUNTER — Other Ambulatory Visit: Payer: Self-pay | Admitting: Cardiology

## 2014-05-10 ENCOUNTER — Encounter: Payer: Self-pay | Admitting: Family Medicine

## 2014-05-10 ENCOUNTER — Ambulatory Visit (INDEPENDENT_AMBULATORY_CARE_PROVIDER_SITE_OTHER): Payer: Medicare Other | Admitting: Family Medicine

## 2014-05-10 VITALS — BP 112/54 | HR 56 | Temp 98.2°F | Ht 70.0 in | Wt 174.6 lb

## 2014-05-10 DIAGNOSIS — G609 Hereditary and idiopathic neuropathy, unspecified: Secondary | ICD-10-CM

## 2014-05-10 DIAGNOSIS — M1990 Unspecified osteoarthritis, unspecified site: Secondary | ICD-10-CM

## 2014-05-10 DIAGNOSIS — M129 Arthropathy, unspecified: Secondary | ICD-10-CM

## 2014-05-10 DIAGNOSIS — N401 Enlarged prostate with lower urinary tract symptoms: Secondary | ICD-10-CM

## 2014-05-10 DIAGNOSIS — R351 Nocturia: Secondary | ICD-10-CM

## 2014-05-10 MED ORDER — DICLOFENAC SODIUM 1 % TD GEL: 2.0000 g | Freq: Four times a day (QID) | TRANSDERMAL | Status: DC

## 2014-05-10 MED ORDER — GABAPENTIN 300 MG PO CAPS: 300.0000 mg | ORAL_CAPSULE | Freq: Three times a day (TID) | ORAL | Status: DC

## 2014-05-10 MED ORDER — CIPROFLOXACIN HCL 250 MG PO TABS: 250.0000 mg | ORAL_TABLET | Freq: Two times a day (BID) | ORAL | Status: DC

## 2014-05-10 MED ORDER — TAMSULOSIN HCL 0.4 MG PO CAPS: 0.4000 mg | ORAL_CAPSULE | Freq: Every day | ORAL | Status: DC

## 2014-05-10 NOTE — Progress Notes (Signed)
   Subjective:    Patient ID: Quill Grinder, male    DOB: 29-Sep-1926, 80 y.o.   MRN: [id number]  HPI  This 80 y.o. male presents for evaluation of c/o nocturia and urinary frequency.  He has been feeling weak and tired.  He has peripheral neuropathy and he has been on neurontin and he states it has not been working.  He is having neck pain and arthritis in his neck.  Review of Systems No chest pain, SOB, HA, dizziness, vision change, N/V, diarrhea, constipation, dysuria, urinary urgency or frequency, myalgias, arthralgias or rash.     Objective:   Physical Exam Vital signs noted  Well developed well nourished male.  HEENT - Head atraumatic Normocephalic                Eyes - PERRLA, Conjuctiva - clear Sclera- Clear EOMI                Ears - EAC's Wnl TM's Wnl Gross Hearing WNL                Nose - Nares patent                 Throat - oropharanx wnl Respiratory - Lungs CTA bilateral Cardiac - RRR S1 and S2 without murmur GI - Abdomen soft Nontender and bowel sounds active x 4 Extremities - No edema. Neuro - Grossly intact.       Assessment & Plan:  Benign prostatic hypertrophy (BPH) with nocturia - Plan: ciprofloxacin (CIPRO) 250 MG tablet, tamsulosin (FLOMAX) 0.4 MG CAPS capsule  Arthritis - Plan: diclofenac sodium (VOLTAREN) 1 % GEL  Unspecified hereditary and idiopathic peripheral neuropathy - Plan: gabapentin (NEURONTIN) 300 MG capsule  Follow up in 2 weeks.  Lysbeth Penner FNP

## 2014-05-11 ENCOUNTER — Other Ambulatory Visit: Payer: Self-pay | Admitting: Family Medicine

## 2014-05-12 ENCOUNTER — Ambulatory Visit (INDEPENDENT_AMBULATORY_CARE_PROVIDER_SITE_OTHER): Payer: Medicare Other | Admitting: Pharmacist

## 2014-05-12 ENCOUNTER — Telehealth: Payer: Self-pay | Admitting: Pharmacist

## 2014-05-12 DIAGNOSIS — I4891 Unspecified atrial fibrillation: Secondary | ICD-10-CM

## 2014-05-12 DIAGNOSIS — R5383 Other fatigue: Secondary | ICD-10-CM

## 2014-05-12 DIAGNOSIS — R5381 Other malaise: Secondary | ICD-10-CM

## 2014-05-12 LAB — POCT CBC
Granulocyte percent: 78.5 %G (ref 37–80)
HCT, POC: 39.9 % — AB (ref 43.5–53.7)
Hemoglobin: 12.9 g/dL — AB (ref 14.1–18.1)
Lymph, poc: 1 (ref 0.6–3.4)
MCH, POC: 26.8 pg — AB (ref 27–31.2)
MCHC: 32.4 g/dL (ref 31.8–35.4)
MCV: 82.6 fL (ref 80–97)
MPV: 7.9 fL (ref 0–99.8)
POC Granulocyte: 3.8 (ref 2–6.9)
POC LYMPH PERCENT: 19.9 %L (ref 10–50)
Platelet Count, POC: 286 10*3/uL (ref 142–424)
RBC: 4.8 M/uL (ref 4.69–6.13)
RDW, POC: 14.1 %
WBC: 4.8 10*3/uL (ref 4.6–10.2)

## 2014-05-12 LAB — POCT UA - MICROSCOPIC ONLY
Casts, Ur, LPF, POC: NEGATIVE
Crystals, Ur, HPF, POC: NEGATIVE
Mucus, UA: NEGATIVE
Yeast, UA: NEGATIVE

## 2014-05-12 LAB — POCT URINALYSIS DIPSTICK
Bilirubin, UA: NEGATIVE
Glucose, UA: NEGATIVE
Ketones, UA: NEGATIVE
Leukocytes, UA: NEGATIVE
Nitrite, UA: NEGATIVE
Protein, UA: NEGATIVE
Spec Grav, UA: 1.01
Urobilinogen, UA: NEGATIVE
pH, UA: 6.5

## 2014-05-12 LAB — POCT INR: INR: 2.2

## 2014-05-12 NOTE — Patient Instructions (Addendum)
Anticoagulation Dose Instructions as of 05/12/2014     [name] Tue Wed Thu Fri Sat   New Dose 5 mg 5 mg 5 mg 5 mg 5 mg 5 mg 5 mg    Description       Continue warfarin 5mg  1 tablet daily.      INR was 2.2 today

## 2014-05-13 LAB — BMP8+EGFR
BUN/Creatinine Ratio: 20 (ref 10–22)
BUN: 31 mg/dL — ABNORMAL HIGH (ref 8–27)
CO2: 24 mmol/L (ref 18–29)
Calcium: 9.2 mg/dL (ref 8.6–10.2)
Chloride: 96 mmol/L — ABNORMAL LOW (ref 97–108)
Creatinine, Ser: 1.58 mg/dL — ABNORMAL HIGH (ref 0.76–1.27)
GFR calc Af Amer: 45 mL/min/{1.73_m2} — ABNORMAL LOW (ref >59)
GFR calc non Af Amer: 39 mL/min/{1.73_m2} — ABNORMAL LOW (ref >59)
Glucose: 109 mg/dL — ABNORMAL HIGH (ref 65–99)
Potassium: 4.8 mmol/L (ref 3.5–5.2)
Sodium: 135 mmol/L (ref 134–144)

## 2014-05-13 LAB — ANEMIA PROFILE B
Basophils Absolute: 0 10*3/uL (ref 0.0–0.2)
Basos: 0 %
Eos: 3 %
Eosinophils Absolute: 0.2 10*3/uL (ref 0.0–0.4)
Ferritin: 89 ng/mL (ref 30–400)
Folate: 14.9 ng/mL (ref >3.0)
HCT: 37.2 % — ABNORMAL LOW (ref 37.5–51.0)
Hemoglobin: 12.7 g/dL (ref 12.6–17.7)
Immature Grans (Abs): 0 10*3/uL (ref 0.0–0.1)
Immature Granulocytes: 0 %
Iron Saturation: 15 % (ref 15–55)
Iron: 51 ug/dL (ref 40–155)
Lymphocytes Absolute: 1 10*3/uL (ref 0.7–3.1)
Lymphs: 22 %
MCH: 27.7 pg (ref 26.6–33.0)
MCHC: 34.1 g/dL (ref 31.5–35.7)
MCV: 81 fL (ref 79–97)
Monocytes Absolute: 0.3 10*3/uL (ref 0.1–0.9)
Monocytes: 6 %
Neutrophils Absolute: 3.1 10*3/uL (ref 1.4–7.0)
Neutrophils Relative %: 69 %
Platelets: 286 10*3/uL (ref 150–379)
RBC: 4.59 x10E6/uL (ref 4.14–5.80)
RDW: 14.8 % (ref 12.3–15.4)
Retic Ct Pct: 1.2 % (ref 0.6–2.6)
TIBC: 346 ug/dL (ref 250–450)
UIBC: 295 ug/dL (ref 150–375)
Vitamin B-12: >1999 pg/mL — ABNORMAL HIGH (ref 211–946)
WBC: 4.6 10*3/uL (ref 3.4–10.8)

## 2014-05-19 NOTE — Telephone Encounter (Signed)
Patient to come in 05/20/14

## 2014-05-20 ENCOUNTER — Ambulatory Visit (INDEPENDENT_AMBULATORY_CARE_PROVIDER_SITE_OTHER): Payer: Medicare Other | Admitting: Family Medicine

## 2014-05-20 ENCOUNTER — Encounter: Payer: Self-pay | Admitting: Family Medicine

## 2014-05-20 VITALS — BP 110/61 | HR 61 | Temp 97.1°F | Ht 70.0 in | Wt 177.0 lb

## 2014-05-20 DIAGNOSIS — I4891 Unspecified atrial fibrillation: Secondary | ICD-10-CM

## 2014-05-20 DIAGNOSIS — I10 Essential (primary) hypertension: Secondary | ICD-10-CM

## 2014-05-20 DIAGNOSIS — I482 Chronic atrial fibrillation, unspecified: Secondary | ICD-10-CM

## 2014-05-20 DIAGNOSIS — I428 Other cardiomyopathies: Secondary | ICD-10-CM

## 2014-05-20 NOTE — Progress Notes (Signed)
   Subjective:    Patient ID: [name], male    DOB: 1925-10-28, 80 y.o.   MRN: [id number]  HPI This 80 y.o. male presents for evaluation of routine visit.  He has hx of CMO, CAD, atrial Fib, and hypertension.  He has had recent labs.   Review of Systems    No chest pain, SOB, HA, dizziness, vision change, N/V, diarrhea, constipation, dysuria, urinary urgency or frequency, myalgias, arthralgias or rash.  Objective:   Physical Exam  Vital signs noted  Well developed well nourished male.  HEENT - Head atraumatic Normocephalic                Eyes - PERRLA, Conjuctiva - clear Sclera- Clear EOMI                Ears - EAC's Wnl TM's Wnl Gross Hearing WNL                Nose - Nares patent                 Throat - oropharanx wnl Respiratory - Lungs CTA bilateral Cardiac - IrregularS1 and S2 w/ murmur GI - Abdomen soft Nontender and bowel sounds active x 4      Assessment & Plan:  HYPERTENSION - Controlled and continue current regimen  CARDIOMYOPATHY - Follow up with Dr. Rosie Fate  Chronic atrial fibrillation - Continue current regimen.  Mild anemia - Stable and probably due to CKD  Discussed recent labs and recommend he follow up in 3 months.  Lysbeth Penner FNP

## 2014-06-02 ENCOUNTER — Other Ambulatory Visit: Payer: Self-pay | Admitting: *Deleted

## 2014-06-02 MED ORDER — LISINOPRIL 40 MG PO TABS: ORAL_TABLET | ORAL | Status: DC

## 2014-06-14 ENCOUNTER — Other Ambulatory Visit: Payer: Self-pay | Admitting: *Deleted

## 2014-06-14 ENCOUNTER — Other Ambulatory Visit: Payer: Self-pay | Admitting: Family Medicine

## 2014-06-14 DIAGNOSIS — E785 Hyperlipidemia, unspecified: Secondary | ICD-10-CM

## 2014-06-14 MED ORDER — FENOFIBRATE 120 MG PO TABS: ORAL_TABLET | ORAL | Status: DC

## 2014-06-14 MED ORDER — ZOLPIDEM TARTRATE 5 MG PO TABS: ORAL_TABLET | ORAL | Status: DC

## 2014-06-14 NOTE — Telephone Encounter (Signed)
Patient last seen in office on 05-20-14. Rx last filled on 05-10-14 for #30.Please advise on refill. If approved please route to Pool A so nurse can phone in to [facility]

## 2014-06-16 ENCOUNTER — Other Ambulatory Visit: Payer: Self-pay | Admitting: *Deleted

## 2014-06-16 MED ORDER — HYDROCHLOROTHIAZIDE 25 MG PO TABS: ORAL_TABLET | ORAL | Status: DC

## 2014-06-16 MED ORDER — CARVEDILOL 12.5 MG PO TABS: ORAL_TABLET | ORAL | Status: DC

## 2014-06-21 ENCOUNTER — Ambulatory Visit (INDEPENDENT_AMBULATORY_CARE_PROVIDER_SITE_OTHER): Payer: Medicare Other | Admitting: Pharmacist Clinician (PhC)/ Clinical Pharmacy Specialist

## 2014-06-21 DIAGNOSIS — I4891 Unspecified atrial fibrillation: Secondary | ICD-10-CM

## 2014-06-21 LAB — POCT INR: INR: 2.2

## 2014-06-30 ENCOUNTER — Other Ambulatory Visit: Payer: Self-pay | Admitting: Family Medicine

## 2014-08-02 ENCOUNTER — Ambulatory Visit (INDEPENDENT_AMBULATORY_CARE_PROVIDER_SITE_OTHER): Payer: Medicare Other | Admitting: Pharmacist Clinician (PhC)/ Clinical Pharmacy Specialist

## 2014-08-02 DIAGNOSIS — I4891 Unspecified atrial fibrillation: Secondary | ICD-10-CM

## 2014-08-02 LAB — POCT INR: INR: 1.7

## 2014-08-15 ENCOUNTER — Other Ambulatory Visit: Payer: Self-pay | Admitting: *Deleted

## 2014-08-15 MED ORDER — NITROGLYCERIN 0.4 MG SL SUBL: 0.4000 mg | SUBLINGUAL_TABLET | SUBLINGUAL | Status: DC | PRN

## 2014-08-24 ENCOUNTER — Ambulatory Visit: Payer: Medicare Other | Admitting: Family Medicine

## 2014-08-29 ENCOUNTER — Encounter: Payer: Self-pay | Admitting: Family Medicine

## 2014-08-29 ENCOUNTER — Ambulatory Visit (INDEPENDENT_AMBULATORY_CARE_PROVIDER_SITE_OTHER): Payer: Medicare Other | Admitting: Family Medicine

## 2014-08-29 VITALS — BP 114/55 | HR 61 | Temp 97.5°F | Ht 66.5 in | Wt 178.0 lb

## 2014-08-29 DIAGNOSIS — I10 Essential (primary) hypertension: Secondary | ICD-10-CM

## 2014-08-29 DIAGNOSIS — E538 Deficiency of other specified B group vitamins: Secondary | ICD-10-CM

## 2014-08-29 DIAGNOSIS — I4891 Unspecified atrial fibrillation: Secondary | ICD-10-CM

## 2014-08-29 DIAGNOSIS — E785 Hyperlipidemia, unspecified: Secondary | ICD-10-CM

## 2014-08-29 LAB — POCT CBC
Granulocyte percent: 65.2 %G (ref 37–80)
HCT, POC: 40.2 % — AB (ref 43.5–53.7)
Hemoglobin: 13 g/dL — AB (ref 14.1–18.1)
Lymph, poc: 1.1 (ref 0.6–3.4)
MCH, POC: 26.5 pg — AB (ref 27–31.2)
MCHC: 32.4 g/dL (ref 31.8–35.4)
MCV: 81.6 fL (ref 80–97)
MPV: 7.6 fL (ref 0–99.8)
POC Granulocyte: 2.5 (ref 2–6.9)
POC LYMPH PERCENT: 27.8 %L (ref 10–50)
Platelet Count, POC: 250 10*3/uL (ref 142–424)
RBC: 4.9 M/uL (ref 4.69–6.13)
RDW, POC: 14.3 %
WBC: 3.8 10*3/uL — AB (ref 4.6–10.2)

## 2014-08-29 NOTE — Progress Notes (Signed)
   Subjective:    Patient ID: [name], male    DOB: 08-07-1926, 80 y.o.   MRN: [id number]  HPI Patient is here for routine visit.  He has no acute medical concerns. He has hx of b12 deficiency, atrial fibrillation, hyperlipidemia, and hypertension.  Review of Systems  Constitutional: Negative for fever.  HENT: Negative for ear pain.   Eyes: Negative for discharge.  Respiratory: Negative for cough.   Cardiovascular: Negative for chest pain.  Gastrointestinal: Negative for abdominal distention.  Endocrine: Negative for polyuria.  Genitourinary: Negative for difficulty urinating.  Musculoskeletal: Negative for gait problem and neck pain.  Skin: Negative for color change and rash.  Neurological: Negative for speech difficulty and headaches.  Psychiatric/Behavioral: Negative for agitation.       Objective:    BP 114/55 mmHg  Pulse 61  Temp(Src) 97.5 F (36.4 C) (Oral)  Ht 5' 6.5" (1.689 m)  Wt 178 lb (80.74 kg)  BMI 28.30 kg/m2 Physical Exam  Constitutional: He is oriented to person, place, and time. He appears well-developed and well-nourished.  HENT:  Head: Normocephalic and atraumatic.  Mouth/Throat: Oropharynx is clear and moist.  Eyes: Pupils are equal, round, and reactive to light.  Neck: Normal range of motion. Neck supple.  Cardiovascular: Normal rate and regular rhythm.   No murmur heard. Pulmonary/Chest: Effort normal and breath sounds normal.  Abdominal: Soft. Bowel sounds are normal. There is no tenderness.  Neurological: He is alert and oriented to person, place, and time.  Skin: Skin is warm and dry.  Psychiatric: He has a normal mood and affect.          Assessment & Plan:     ICD-9-CM ICD-10-CM   1. Dyslipidemia 272.4 E78.5 Lipid panel     CMP14+EGFR  2. Atrial fibrillation, unspecified 427.31 I48.91 CMP14+EGFR  3. Essential hypertension, benign 401.1 I10 POCT CBC     CMP14+EGFR  4. B12 deficiency 266.2 E53.8 Vitamin B12     No  Follow-up on file.  Lysbeth Penner FNP

## 2014-08-30 LAB — CMP14+EGFR
ALT: 18 IU/L (ref 0–44)
AST: 33 IU/L (ref 0–40)
Albumin/Globulin Ratio: 1.4 (ref 1.1–2.5)
Albumin: 4 g/dL (ref 3.5–4.7)
Alkaline Phosphatase: 40 IU/L (ref 39–117)
BUN/Creatinine Ratio: 23 — ABNORMAL HIGH (ref 10–22)
BUN: 33 mg/dL — ABNORMAL HIGH (ref 8–27)
CO2: 22 mmol/L (ref 18–29)
Calcium: 8.9 mg/dL (ref 8.6–10.2)
Chloride: 103 mmol/L (ref 97–108)
Creatinine, Ser: 1.41 mg/dL — ABNORMAL HIGH (ref 0.76–1.27)
GFR calc Af Amer: 52 mL/min/{1.73_m2} — ABNORMAL LOW (ref >59)
GFR calc non Af Amer: 45 mL/min/{1.73_m2} — ABNORMAL LOW (ref >59)
Globulin, Total: 2.9 g/dL (ref 1.5–4.5)
Glucose: 124 mg/dL — ABNORMAL HIGH (ref 65–99)
Potassium: 4.2 mmol/L (ref 3.5–5.2)
Sodium: 140 mmol/L (ref 134–144)
Total Bilirubin: 0.3 mg/dL (ref 0.0–1.2)
Total Protein: 6.9 g/dL (ref 6.0–8.5)

## 2014-08-30 LAB — VITAMIN B12: Vitamin B-12: >1999 pg/mL — ABNORMAL HIGH (ref 211–946)

## 2014-08-30 LAB — LIPID PANEL
Chol/HDL Ratio: 6.1 ratio units — ABNORMAL HIGH (ref 0.0–5.0)
Cholesterol, Total: 166 mg/dL (ref 100–199)
HDL: 27 mg/dL — ABNORMAL LOW (ref >39)
LDL Calculated: 75 mg/dL (ref 0–99)
Triglycerides: 318 mg/dL — ABNORMAL HIGH (ref 0–149)
VLDL Cholesterol Cal: 64 mg/dL — ABNORMAL HIGH (ref 5–40)

## 2014-09-06 ENCOUNTER — Other Ambulatory Visit: Payer: Self-pay | Admitting: Family Medicine

## 2014-09-06 ENCOUNTER — Encounter: Payer: Self-pay | Admitting: Pharmacist Clinician (PhC)/ Clinical Pharmacy Specialist

## 2014-09-12 ENCOUNTER — Other Ambulatory Visit: Payer: Self-pay | Admitting: Family Medicine

## 2014-09-13 ENCOUNTER — Ambulatory Visit (INDEPENDENT_AMBULATORY_CARE_PROVIDER_SITE_OTHER): Payer: Medicare Other | Admitting: Pharmacist Clinician (PhC)/ Clinical Pharmacy Specialist

## 2014-09-13 DIAGNOSIS — I4891 Unspecified atrial fibrillation: Secondary | ICD-10-CM

## 2014-09-13 DIAGNOSIS — M25532 Pain in left wrist: Secondary | ICD-10-CM

## 2014-09-13 LAB — POCT INR: INR: 1.7

## 2014-09-13 NOTE — Patient Instructions (Signed)
Anticoagulation Dose Instructions as of 09/13/2014      [name] Tue Wed Thu Fri Sat   New Dose 5 mg 5 mg 7.5 mg 5 mg 5 mg 7.5 mg 5 mg    Description        Increase to 1 1/2 tablets on Tuesdays and Fridays and 1 tablet all other days of the week.

## 2014-09-14 ENCOUNTER — Telehealth: Payer: Self-pay | Admitting: Family Medicine

## 2014-09-14 LAB — URIC ACID: Uric Acid: 6.3 mg/dL (ref 3.7–8.6)

## 2014-09-14 NOTE — Telephone Encounter (Signed)
Uric acid was WNL.  Recommend follow up PCP for wrist, hip and knee pain. appt made for [name] 09/15/14 at 3pm

## 2014-09-15 ENCOUNTER — Encounter: Payer: Self-pay | Admitting: Family Medicine

## 2014-09-15 ENCOUNTER — Ambulatory Visit (INDEPENDENT_AMBULATORY_CARE_PROVIDER_SITE_OTHER): Payer: Medicare Other | Admitting: Family Medicine

## 2014-09-15 VITALS — BP 125/58 | HR 62 | Temp 96.7°F | Ht 70.0 in | Wt 179.2 lb

## 2014-09-15 DIAGNOSIS — M1990 Unspecified osteoarthritis, unspecified site: Secondary | ICD-10-CM

## 2014-09-15 MED ORDER — HYDROCODONE-ACETAMINOPHEN 5-325 MG PO TABS: ORAL_TABLET | ORAL | Status: DC

## 2014-09-15 NOTE — Progress Notes (Signed)
   Subjective:    Patient ID: [name], male    DOB: 09/19/1926, 80 y.o.   MRN: [id number]  HPI C/o knee pain and hip pain after lifting cinder blocks  Review of Systems  Constitutional: Negative for fever.  HENT: Negative for ear pain.   Eyes: Negative for discharge.  Respiratory: Negative for cough.   Cardiovascular: Negative for chest pain.  Gastrointestinal: Negative for abdominal distention.  Endocrine: Negative for polyuria.  Genitourinary: Negative for difficulty urinating.  Musculoskeletal: Negative for gait problem and neck pain.  Skin: Negative for color change and rash.  Neurological: Negative for speech difficulty and headaches.  Psychiatric/Behavioral: Negative for agitation.       Objective:    BP 125/58 mmHg  Pulse 62  Temp(Src) 96.7 F (35.9 C) (Oral)  Ht 5\' 10"  (1.778 m)  Wt 179 lb 3.2 oz (81.285 kg)  BMI 25.71 kg/m2 Physical Exam  Constitutional: He is oriented to person, place, and time. He appears well-developed and well-nourished.  HENT:  Head: Normocephalic and atraumatic.  Mouth/Throat: Oropharynx is clear and moist.  Eyes: Pupils are equal, round, and reactive to light.  Neck: Normal range of motion. Neck supple.  Cardiovascular: Normal rate and regular rhythm.   No murmur heard. Pulmonary/Chest: Effort normal and breath sounds normal.  Abdominal: Soft. Bowel sounds are normal. There is no tenderness.  Neurological: He is alert and oriented to person, place, and time.  Skin: Skin is warm and dry.  Psychiatric: He has a normal mood and affect.          Assessment & Plan:     ICD-9-CM ICD-10-CM   1. Arthritis 716.90 M19.90 HYDROcodone-acetaminophen (NORCO) 5-325 MG per tablet     No Follow-up on file.  Lysbeth Penner FNP

## 2014-09-20 ENCOUNTER — Telehealth: Payer: Self-pay | Admitting: Pharmacist Clinician (PhC)/ Clinical Pharmacy Specialist

## 2014-09-20 NOTE — Telephone Encounter (Signed)
Called patient to notify him uric acid is slightly elevated at 6.3.  Would recommend re-checking uric acid again in 2-3 months.

## 2014-09-27 ENCOUNTER — Ambulatory Visit (INDEPENDENT_AMBULATORY_CARE_PROVIDER_SITE_OTHER): Payer: Medicare Other | Admitting: Pharmacist Clinician (PhC)/ Clinical Pharmacy Specialist

## 2014-09-27 DIAGNOSIS — I4891 Unspecified atrial fibrillation: Secondary | ICD-10-CM

## 2014-09-27 LAB — POCT INR: INR: 1.3

## 2014-10-11 ENCOUNTER — Ambulatory Visit (INDEPENDENT_AMBULATORY_CARE_PROVIDER_SITE_OTHER): Payer: Medicare Other | Admitting: Pharmacist Clinician (PhC)/ Clinical Pharmacy Specialist

## 2014-10-11 ENCOUNTER — Encounter: Payer: Self-pay | Admitting: Pharmacist Clinician (PhC)/ Clinical Pharmacy Specialist

## 2014-10-11 DIAGNOSIS — I4891 Unspecified atrial fibrillation: Secondary | ICD-10-CM

## 2014-10-11 LAB — POCT INR: INR: 3.2

## 2014-10-11 NOTE — Progress Notes (Signed)
Patient comes in today for both anticoagulation follow up and to review how to eat for prevention of type 2 DM.  Patient has a muscle pull and is not feeling well today, he is leaving from here to go back to urgent care where they treated him last week.  Patient will cut back on sweet drinks, deserts, and CHO.  He prefers to wait until next visit for me to teach him how to use a home glucometer.  Will check an A1C next visit as well.

## 2014-10-11 NOTE — Patient Instructions (Signed)
Anticoagulation Dose Instructions as of 10/11/2014      [name] Tue Wed Thu Fri Sat   New Dose 5 mg 5 mg 5 mg 5 mg 5 mg 7.5 mg 5 mg    Description        Changed tuesdays from 1 1/2 tablets to 1 tablet

## 2014-10-31 ENCOUNTER — Other Ambulatory Visit: Payer: Self-pay | Admitting: Cardiology

## 2014-11-07 ENCOUNTER — Other Ambulatory Visit: Payer: Self-pay | Admitting: Cardiology

## 2014-11-14 ENCOUNTER — Ambulatory Visit (INDEPENDENT_AMBULATORY_CARE_PROVIDER_SITE_OTHER): Payer: Medicare Other | Admitting: Pharmacist

## 2014-11-14 DIAGNOSIS — R739 Hyperglycemia, unspecified: Secondary | ICD-10-CM | POA: Diagnosis not present

## 2014-11-14 DIAGNOSIS — I482 Chronic atrial fibrillation, unspecified: Secondary | ICD-10-CM

## 2014-11-14 DIAGNOSIS — R7309 Other abnormal glucose: Secondary | ICD-10-CM | POA: Diagnosis not present

## 2014-11-14 DIAGNOSIS — D649 Anemia, unspecified: Secondary | ICD-10-CM

## 2014-11-14 DIAGNOSIS — I4891 Unspecified atrial fibrillation: Secondary | ICD-10-CM

## 2014-11-14 LAB — POCT GLYCOSYLATED HEMOGLOBIN (HGB A1C): Hemoglobin A1C: 6

## 2014-11-14 LAB — HEMOGLOBIN A1C: Hgb A1c MFr Bld: 6 % (ref 4.0–6.0)

## 2014-11-14 LAB — POCT INR: INR: 1.9

## 2014-11-14 NOTE — Patient Instructions (Signed)
Anticoagulation Dose Instructions as of 11/14/2014      [name] Tue Wed Thu Fri Sat   New Dose 5 mg 5 mg 5 mg 5 mg 5 mg 7.5 mg 5 mg    Description        Take 1 and 1/2 tablets today - Monday, February 1st, then restart usual warfarin dose of 1 tablet daily except 1 and 1/2 on fridays.        INR was 1.9 today

## 2014-11-14 NOTE — Progress Notes (Signed)
Subjective:     Indication: atrial fibrillation Bleeding signs/symptoms: None Thromboembolic signs/symptoms: None  Missed Coumadin doses: None Medication changes: no Dietary changes: no Bacterial/viral infection: no Other concerns: yes - per last anticoagulation note patient is to get A1c done today due to elevated blood glucose.  Also patient asks if he should continue taking ferrous sulfate.   His last CBC was 08/2014.    Objective:    INR Today: 1.9 Current dose: warfarin 5mg 1 tablet daily except 7.5mg on fridays.    A1c was 6.0% today Assessment:    Subtherapeutic INR for goal of 2-3   Elevated BG  Anemia  Plan:    1. New dose: take 1 and 1/2 tablets today then resume current dose of warfarin 5mg 1 tablet daily except 1 and 1/2 tablets on Fridays.   2. Next INR: 1 month   3.  Continue ferrous sulfate until anemia panel returns. Orders Placed This Encounter  Procedures  . Anemia Profile B  . BMP8+EGFR  . POCT glycosylated hemoglobin (Hb A1C)    , PharmD, CPP  

## 2014-11-15 LAB — ANEMIA PROFILE B
BASOS ABS: 0 10*3/uL (ref 0.0–0.2)
Basos: 1 %
EOS: 3 %
Eosinophils Absolute: 0.1 10*3/uL (ref 0.0–0.4)
Ferritin: 144 ng/mL (ref 30–400)
Folate: 13 ng/mL (ref >3.0)
HEMATOCRIT: 38.7 % (ref 37.5–51.0)
Hemoglobin: 12.8 g/dL (ref 12.6–17.7)
IMMATURE GRANS (ABS): 0 10*3/uL (ref 0.0–0.1)
IRON SATURATION: 32 % (ref 15–55)
IRON: 116 ug/dL (ref 38–169)
Immature Granulocytes: 0 %
LYMPHS ABS: 1.3 10*3/uL (ref 0.7–3.1)
LYMPHS: 28 %
MCH: 27.8 pg (ref 26.6–33.0)
MCHC: 33.1 g/dL (ref 31.5–35.7)
MCV: 84 fL (ref 79–97)
Monocytes Absolute: 0.4 10*3/uL (ref 0.1–0.9)
Monocytes: 8 %
NEUTROS ABS: 2.8 10*3/uL (ref 1.4–7.0)
NEUTROS PCT: 60 %
PLATELETS: 229 10*3/uL (ref 150–379)
RBC: 4.61 x10E6/uL (ref 4.14–5.80)
RDW: 15.5 % — ABNORMAL HIGH (ref 12.3–15.4)
Retic Ct Pct: 1.2 % (ref 0.6–2.6)
TIBC: 359 ug/dL (ref 250–450)
UIBC: 243 ug/dL (ref 111–343)
Vitamin B-12: 1184 pg/mL — ABNORMAL HIGH (ref 211–946)
WBC: 4.6 10*3/uL (ref 3.4–10.8)

## 2014-11-15 LAB — BMP8+EGFR
BUN/Creatinine Ratio: 26 — ABNORMAL HIGH (ref 10–22)
BUN: 36 mg/dL — ABNORMAL HIGH (ref 8–27)
CO2: 22 mmol/L (ref 18–29)
Calcium: 8.9 mg/dL (ref 8.6–10.2)
Chloride: 105 mmol/L (ref 97–108)
Creatinine, Ser: 1.39 mg/dL — ABNORMAL HIGH (ref 0.76–1.27)
GFR calc Af Amer: 53 mL/min/{1.73_m2} — ABNORMAL LOW (ref >59)
GFR calc non Af Amer: 46 mL/min/{1.73_m2} — ABNORMAL LOW (ref >59)
GLUCOSE: 112 mg/dL — AB (ref 65–99)
Potassium: 4.8 mmol/L (ref 3.5–5.2)
Sodium: 141 mmol/L (ref 134–144)

## 2014-11-16 ENCOUNTER — Telehealth: Payer: Self-pay | Admitting: Pharmacist

## 2014-11-16 NOTE — Telephone Encounter (Signed)
Anemia panel WNL . B12 has improved.  Serum creatinine still elevated but improved. Recommend continue to monitor serum creatinine.  In future since BP has been well controlled if serum creatinine increases consider decrease dose of ACE inhibitor   Tried to call patient - No Ans / LM to call office

## 2014-11-17 ENCOUNTER — Telehealth: Payer: Self-pay | Admitting: Family Medicine

## 2014-11-17 NOTE — Telephone Encounter (Signed)
Patient notified of labs.   

## 2014-11-17 NOTE — Telephone Encounter (Signed)
See call notes from 11/16/2014 - patient called back and notified of labs

## 2014-11-28 ENCOUNTER — Ambulatory Visit: Payer: Medicare Other | Admitting: Family Medicine

## 2014-11-29 ENCOUNTER — Other Ambulatory Visit: Payer: Self-pay | Admitting: Family Medicine

## 2014-12-02 ENCOUNTER — Encounter: Payer: Self-pay | Admitting: Family Medicine

## 2014-12-02 ENCOUNTER — Ambulatory Visit (INDEPENDENT_AMBULATORY_CARE_PROVIDER_SITE_OTHER): Payer: Medicare Other | Admitting: Family Medicine

## 2014-12-02 VITALS — BP 125/61 | HR 56 | Temp 97.2°F | Ht 66.0 in | Wt 178.6 lb

## 2014-12-02 DIAGNOSIS — D649 Anemia, unspecified: Secondary | ICD-10-CM | POA: Diagnosis not present

## 2014-12-02 DIAGNOSIS — I482 Chronic atrial fibrillation, unspecified: Secondary | ICD-10-CM

## 2014-12-02 DIAGNOSIS — I10 Essential (primary) hypertension: Secondary | ICD-10-CM | POA: Diagnosis not present

## 2014-12-02 DIAGNOSIS — E039 Hypothyroidism, unspecified: Secondary | ICD-10-CM

## 2014-12-02 DIAGNOSIS — G609 Hereditary and idiopathic neuropathy, unspecified: Secondary | ICD-10-CM

## 2014-12-02 MED ORDER — GABAPENTIN 600 MG PO TABS: 600.0000 mg | ORAL_TABLET | Freq: Three times a day (TID) | ORAL | Status: DC

## 2014-12-02 NOTE — Patient Instructions (Signed)
Fat and Cholesterol Control Diet Fat and cholesterol levels in your blood and organs are influenced by your diet. High levels of fat and cholesterol may lead to diseases of the heart, small and large blood vessels, gallbladder, liver, and pancreas. CONTROLLING FAT AND CHOLESTEROL WITH DIET Although exercise and lifestyle factors are important, your diet is key. That is because certain foods are known to raise cholesterol and others to lower it. The goal is to balance foods for their effect on cholesterol and more importantly, to replace saturated and trans fat with other types of fat, such as monounsaturated fat, polyunsaturated fat, and omega-3 fatty acids. On average, a person should consume no more than 15 to 17 g of saturated fat daily. Saturated and trans fats are considered "bad" fats, and they will raise LDL cholesterol. Saturated fats are primarily found in animal products such as meats, butter, and cream. However, that does not mean you need to give up all your favorite foods. Today, there are good tasting, low-fat, low-cholesterol substitutes for most of the things you like to eat. Choose low-fat or nonfat alternatives. Choose round or loin cuts of red meat. These types of cuts are lowest in fat and cholesterol. Chicken (without the skin), fish, veal, and ground turkey breast are great choices. Eliminate fatty meats, such as hot dogs and salami. Even shellfish have little or no saturated fat. Have a 3 oz (85 g) portion when you eat lean meat, poultry, or fish. Trans fats are also called "partially hydrogenated oils." They are oils that have been scientifically manipulated so that they are solid at room temperature resulting in a longer shelf life and improved taste and texture of foods in which they are added. Trans fats are found in stick margarine, some tub margarines, cookies, crackers, and baked goods.  When baking and cooking, oils are a great substitute for butter. The monounsaturated oils are  especially beneficial since it is believed they lower LDL and raise HDL. The oils you should avoid entirely are saturated tropical oils, such as coconut and palm.  Remember to eat a lot from food groups that are naturally free of saturated and trans fat, including fish, fruit, vegetables, beans, grains (barley, rice, couscous, bulgur wheat), and pasta (without cream sauces).  IDENTIFYING FOODS THAT LOWER FAT AND CHOLESTEROL  Soluble fiber may lower your cholesterol. This type of fiber is found in fruits such as apples, vegetables such as broccoli, potatoes, and carrots, legumes such as beans, peas, and lentils, and grains such as barley. Foods fortified with plant sterols (phytosterol) may also lower cholesterol. You should eat at least 2 g per day of these foods for a cholesterol lowering effect.  Read package labels to identify low-saturated fats, trans fat free, and low-fat foods at the supermarket. Select cheeses that have only 2 to 3 g saturated fat per ounce. Use a heart-healthy tub margarine that is free of trans fats or partially hydrogenated oil. When buying baked goods (cookies, crackers), avoid partially hydrogenated oils. Breads and muffins should be made from whole grains (whole-wheat or whole oat flour, instead of "flour" or "enriched flour"). Buy non-creamy canned soups with reduced salt and no added fats.  FOOD PREPARATION TECHNIQUES  Never deep-fry. If you must fry, either stir-fry, which uses very little fat, or use non-stick cooking sprays. When possible, broil, bake, or roast meats, and steam vegetables. Instead of putting butter or margarine on vegetables, use lemon and herbs, applesauce, and cinnamon (for squash and sweet potatoes). Use nonfat   yogurt, salsa, and low-fat dressings for salads.  LOW-SATURATED FAT / LOW-FAT FOOD SUBSTITUTES Meats / Saturated Fat (g)  Avoid: Steak, marbled (3 oz/85 g) / 11 g  Choose: Steak, lean (3 oz/85 g) / 4 g  Avoid: Hamburger (3 oz/85 g) / 7  g  Choose: Hamburger, lean (3 oz/85 g) / 5 g  Avoid: Ham (3 oz/85 g) / 6 g  Choose: Ham, lean cut (3 oz/85 g) / 2.4 g  Avoid: Chicken, with skin, dark meat (3 oz/85 g) / 4 g  Choose: Chicken, skin removed, dark meat (3 oz/85 g) / 2 g  Avoid: Chicken, with skin, light meat (3 oz/85 g) / 2.5 g  Choose: Chicken, skin removed, light meat (3 oz/85 g) / 1 g Dairy / Saturated Fat (g)  Avoid: Whole milk (1 cup) / 5 g  Choose: Low-fat milk, 2% (1 cup) / 3 g  Choose: Low-fat milk, 1% (1 cup) / 1.5 g  Choose: Skim milk (1 cup) / 0.3 g  Avoid: Hard cheese (1 oz/28 g) / 6 g  Choose: Skim milk cheese (1 oz/28 g) / 2 to 3 g  Avoid: Cottage cheese, 4% fat (1 cup) / 6.5 g  Choose: Low-fat cottage cheese, 1% fat (1 cup) / 1.5 g  Avoid: Ice cream (1 cup) / 9 g  Choose: Sherbet (1 cup) / 2.5 g  Choose: Nonfat frozen yogurt (1 cup) / 0.3 g  Choose: Frozen fruit bar / trace  Avoid: Whipped cream (1 tbs) / 3.5 g  Choose: Nondairy whipped topping (1 tbs) / 1 g Condiments / Saturated Fat (g)  Avoid: Mayonnaise (1 tbs) / 2 g  Choose: Low-fat mayonnaise (1 tbs) / 1 g  Avoid: Butter (1 tbs) / 7 g  Choose: Extra light margarine (1 tbs) / 1 g  Avoid: Coconut oil (1 tbs) / 11.8 g  Choose: Olive oil (1 tbs) / 1.8 g  Choose: Corn oil (1 tbs) / 1.7 g  Choose: Safflower oil (1 tbs) / 1.2 g  Choose: Sunflower oil (1 tbs) / 1.4 g  Choose: Soybean oil (1 tbs) / 2.4 g  Choose: Canola oil (1 tbs) / 1 g Document Released: 09/30/2005 Document Revised: 01/25/2013 Document Reviewed: 12/29/2013 [employer] Patient Information 2015 [employer]. This information is not intended to replace advice given to you by your health care provider. Make sure you discuss any questions you have with your health care provider.  

## 2014-12-02 NOTE — Progress Notes (Signed)
Subjective:  Patient ID: [name], male    DOB: 06-06-1926  Age: 81 y.o. MRN: [id number]  CC: Hypertension; Hyperlipidemia; Atrial Fibrillation; and Hypothyroidism   HPI [name] presents for intermittent stinging and burning in both legs. He's been diagnosed with the neuropathy and for that he takes gabapentin. He is taking 300 mg 3 times a day but that is not sufficient to give him adequate relief. Patient requests that a dose he increased it possible to give him better relief. Since been ongoing for quite some time.0  History [name] has a past medical history of Dilated cardiomyopathy; Atrial fibrillation; Drug therapy; CAD (coronary artery disease); Hypertension; Dyslipidemia; and Hypothyroidism.   He has past surgical history that includes Hip surgery and Cardiac catheterization (05/2007).   His family history includes Diabetes in his other; Heart attack in his father and mother; Stroke in his other. He was adopted.He reports that he has never smoked. He has never used smokeless tobacco. He reports that he does not drink alcohol or use illicit drugs.  Current Outpatient Prescriptions on File Prior to Visit  Medication Sig Dispense Refill  . amiodarone (PACERONE) 200 MG tablet TAKE 1 TABLET ONCE DAILY FOR HEART 30 tablet 3  . amLODipine (NORVASC) 10 MG tablet TAKE 1 TABLET ONCE A DAY 30 tablet 5  . Calcium Carb-Cholecalciferol (CALCIUM 500 +D PO) Take 1 tablet by mouth daily.     . carvedilol (COREG) 12.5 MG tablet TAKE (1) TABLET TWICE A DAY. 60 tablet 5  . fenofibrate micronized (ANTARA) 130 MG capsule TAKE 1 CAPSULE DAILY 30 capsule 5  . fish oil-omega-3 fatty acids 1000 MG capsule Take 2 g by mouth daily.    . hydrochlorothiazide (HYDRODIURIL) 25 MG tablet TAKE 1 TABLET ONCE A DAY 90 tablet 1  . levothyroxine (SYNTHROID, LEVOTHROID) 150 MCG tablet Take 1 tablet (150 mcg total) by mouth daily. 30 tablet 8  . lisinopril (PRINIVIL,ZESTRIL) 40 MG tablet TAKE 1  TABLET ONCE A DAY FOR HIGH BLOOD PRESSURE 30 tablet 3  . nitroGLYCERIN (NITROSTAT) 0.4 MG SL tablet Place 1 tablet (0.4 mg total) under the tongue every 5 (five) minutes as needed for chest pain. 25 tablet 1  . NON FORMULARY Ferosul - ferrous sulfate    . tamsulosin (FLOMAX) 0.4 MG CAPS capsule Take 1 capsule (0.4 mg total) by mouth daily. 30 capsule 2  . warfarin (COUMADIN) 5 MG tablet TAKE 1 TABLET DAILY OR AS DIRECTED 32 tablet 2  . zolpidem (AMBIEN) 5 MG tablet TAKE ONE TABLET AT BEDTIME 30 tablet 1  . HYDROcodone-acetaminophen (NORCO) 5-325 MG per tablet One half to one po q 6 hours prn pain (Patient not taking: Reported on 12/02/2014) 30 tablet 0   No current facility-administered medications on file prior to visit.    ROS Review of Systems  Constitutional: Negative for fever, chills, diaphoresis and unexpected weight change.  HENT: Negative for congestion, hearing loss, rhinorrhea, sore throat and trouble swallowing.   Respiratory: Negative for cough, chest tightness, shortness of breath and wheezing.   Gastrointestinal: Negative for nausea, vomiting, abdominal pain, diarrhea, constipation and abdominal distention.  Endocrine: Negative for cold intolerance and heat intolerance.  Genitourinary: Negative for dysuria, hematuria and flank pain.  Musculoskeletal: Negative for joint swelling and arthralgias.  Skin: Negative for rash.  Neurological: Negative for dizziness and headaches.  Psychiatric/Behavioral: Negative for dysphoric mood, decreased concentration and agitation. The patient is not nervous/anxious.     Objective:  BP 125/61 mmHg  Pulse 56  Temp(Src) 97.2 F (36.2 C) (Oral)  Ht 5\' 6"  (1.676 m)  Wt 178 lb 9.6 oz (81.012 kg)  BMI 28.84 kg/m2  BP Readings from Last 3 Encounters:  12/02/14 125/61  09/15/14 125/58  08/29/14 114/55    Wt Readings from Last 3 Encounters:  12/02/14 178 lb 9.6 oz (81.012 kg)  09/15/14 179 lb 3.2 oz (81.285 kg)  08/29/14 178 lb (80.74  kg)     Physical Exam  Constitutional: He is oriented to person, place, and time. He appears well-developed and well-nourished. No distress.  HENT:  Head: Normocephalic and atraumatic.  Right Ear: External ear normal.  Left Ear: External ear normal.  Nose: Nose normal.  Mouth/Throat: Oropharynx is clear and moist.  Eyes: Conjunctivae and EOM are normal. Pupils are equal, round, and reactive to light.  Neck: Normal range of motion. Neck supple. No thyromegaly present.  Cardiovascular: Normal rate, regular rhythm and normal heart sounds.   No murmur heard. Pulmonary/Chest: Effort normal and breath sounds normal. No respiratory distress. He has no wheezes. He has no rales.  Abdominal: Soft. Bowel sounds are normal. He exhibits no distension. There is no tenderness.  Lymphadenopathy:    He has no cervical adenopathy.  Neurological: He is alert and oriented to person, place, and time. He has normal reflexes.  Skin: Skin is warm and dry.  Psychiatric: He has a normal mood and affect. His behavior is normal. Judgment and thought content normal.    Lab Results  Component Value Date   HGBA1C 6.0% 11/14/2014    Lab Results  Component Value Date   WBC 4.0 12/02/2014   HGB 13.3 12/02/2014   HCT 38.9 12/02/2014   PLT 232 12/02/2014   GLUCOSE 112* 11/14/2014   CHOL 197 02/17/2014   TRIG 318* 08/29/2014   HDL 27* 08/29/2014   LDLCALC 75 08/29/2014   ALT 18 08/29/2014   AST 33 08/29/2014   NA 141 11/14/2014   K 4.8 11/14/2014   CL 105 11/14/2014   CREATININE 1.39* 11/14/2014   BUN 36* 11/14/2014   CO2 22 11/14/2014   TSH 3.690 12/02/2014   INR 1.9 11/14/2014   HGBA1C 6.0% 11/14/2014    Dg Chest 2 View  01/27/2014   CLINICAL DATA:  Routine physical exam, atrial fibrillation  EXAM: CHEST  2 VIEW  COMPARISON:  05/20/2007  FINDINGS: Cardiomediastinal silhouette is stable. Atherosclerotic calcifications of thoracic aorta. No acute infiltrate or pleural effusion. No pulmonary edema.  Again noted mild elevation of the right hemidiaphragm. Mild degenerative changes thoracic spine.  IMPRESSION: No active cardiopulmonary disease.   Electronically Signed   By: Lahoma Crocker M.D.   On: 01/27/2014 09:52   Dg Chest 2 View  01/27/2014   This is the back office imaging final text. See progress note  for physician's narrative and interpretation.   Dg Chest 2 View  01/27/2014   This is the back office imaging final text. See progress note  for physician's narrative and interpretation.    Assessment & Plan:   [name] was seen today for hypertension, hyperlipidemia, atrial fibrillation and hypothyroidism.  Diagnoses and all orders for this visit:  Hypothyroidism, unspecified hypothyroidism type Orders: -     TSH -     T3, Free -     T4, Free  Chronic atrial fibrillation Orders: -     Cancel: CBC with Differential/Platelet  Anemia, unspecified anemia type Orders: -     Cancel: CBC with Differential/Platelet -  CBC with Differential/Platelet  Essential hypertension, benign  Hereditary and idiopathic peripheral neuropathy  Other orders -     gabapentin (NEURONTIN) 600 MG tablet; Take 1 tablet (600 mg total) by mouth 3 (three) times daily. -     CBC with Differential/Platelet  I have discontinued Mr. Yerger gabapentin. I am also having him start on gabapentin. Additionally, I am having him maintain his Calcium Carb-Cholecalciferol (CALCIUM 500 +D PO), fish oil-omega-3 fatty acids, NON FORMULARY, levothyroxine, zolpidem, hydrochlorothiazide, nitroGLYCERIN, tamsulosin, fenofibrate micronized, carvedilol, HYDROcodone-acetaminophen, amiodarone, amLODipine, warfarin, and lisinopril.  Meds ordered this encounter  Medications  . gabapentin (NEURONTIN) 600 MG tablet    Sig: Take 1 tablet (600 mg total) by mouth 3 (three) times daily.    Dispense:  60 tablet    Refill:  5    Follow-up: Return in about 3 months (around 03/02/2015) for hypertension.  Claretta Fraise, M.D.

## 2014-12-03 LAB — CBC WITH DIFFERENTIAL/PLATELET
BASOS ABS: 0 10*3/uL (ref 0.0–0.2)
Basos: 1 %
Eos: 3 %
Eosinophils Absolute: 0.1 10*3/uL (ref 0.0–0.4)
HCT: 38.9 % (ref 37.5–51.0)
HEMOGLOBIN: 13.3 g/dL (ref 12.6–17.7)
IMMATURE GRANS (ABS): 0 10*3/uL (ref 0.0–0.1)
Immature Granulocytes: 0 %
LYMPHS ABS: 1.1 10*3/uL (ref 0.7–3.1)
Lymphs: 28 %
MCH: 27.9 pg (ref 26.6–33.0)
MCHC: 34.2 g/dL (ref 31.5–35.7)
MCV: 82 fL (ref 79–97)
Monocytes Absolute: 0.3 10*3/uL (ref 0.1–0.9)
Monocytes: 8 %
NEUTROS ABS: 2.4 10*3/uL (ref 1.4–7.0)
Neutrophils Relative %: 60 %
Platelets: 232 10*3/uL (ref 150–379)
RBC: 4.77 x10E6/uL (ref 4.14–5.80)
RDW: 16.1 % — AB (ref 12.3–15.4)
WBC: 4 10*3/uL (ref 3.4–10.8)

## 2014-12-03 LAB — T3, FREE: T3, Free: 1.7 pg/mL — ABNORMAL LOW (ref 2.0–4.4)

## 2014-12-03 LAB — TSH: TSH: 3.69 u[IU]/mL (ref 0.450–4.500)

## 2014-12-03 LAB — T4, FREE: FREE T4: 1.63 ng/dL (ref 0.82–1.77)

## 2014-12-06 ENCOUNTER — Encounter: Payer: Self-pay | Admitting: Family Medicine

## 2014-12-07 ENCOUNTER — Other Ambulatory Visit: Payer: Self-pay | Admitting: Family Medicine

## 2014-12-11 DIAGNOSIS — M25551 Pain in right hip: Secondary | ICD-10-CM | POA: Diagnosis not present

## 2014-12-11 DIAGNOSIS — S76219A Strain of adductor muscle, fascia and tendon of unspecified thigh, initial encounter: Secondary | ICD-10-CM | POA: Diagnosis not present

## 2014-12-15 ENCOUNTER — Ambulatory Visit: Payer: Self-pay

## 2014-12-20 DIAGNOSIS — M25551 Pain in right hip: Secondary | ICD-10-CM | POA: Diagnosis not present

## 2014-12-23 ENCOUNTER — Encounter: Payer: Self-pay | Admitting: Pharmacist

## 2014-12-23 ENCOUNTER — Ambulatory Visit (INDEPENDENT_AMBULATORY_CARE_PROVIDER_SITE_OTHER): Payer: Medicare Other | Admitting: Pharmacist

## 2014-12-23 VITALS — BP 116/54 | HR 60 | Ht 65.5 in | Wt 183.0 lb

## 2014-12-23 DIAGNOSIS — R7303 Prediabetes: Secondary | ICD-10-CM | POA: Insufficient documentation

## 2014-12-23 DIAGNOSIS — Z0000 Encounter for general adult medical examination without abnormal findings: Secondary | ICD-10-CM

## 2014-12-23 DIAGNOSIS — I480 Paroxysmal atrial fibrillation: Secondary | ICD-10-CM

## 2014-12-23 DIAGNOSIS — M25569 Pain in unspecified knee: Secondary | ICD-10-CM

## 2014-12-23 LAB — POCT INR: INR: 1.7

## 2014-12-23 NOTE — Patient Instructions (Addendum)
Anticoagulation Dose Instructions as of 12/23/2014      Dorene Grebe Tue Wed Thu Fri Sat   New Dose 5 mg 7.5 mg 5 mg 5 mg 5 mg 7.5 mg 5 mg    Description        Increase warfarin dose of 1 tablet daily except 1 and 1/2 on mondays and fridays.       INR was 1.7 today  Preventive Care for Adults A healthy lifestyle and preventive care can promote health and wellness. Preventive health guidelines for men include the following key practices:  A routine yearly physical is a good way to check with your health care provider about your health and preventative screening. It is a chance to share any concerns and updates on your health and to receive a thorough exam.  Visit your dentist for a routine exam and preventative care every 6 months. Brush your teeth twice a day and floss once a day. Good oral hygiene prevents tooth decay and gum disease.  The frequency of eye exams is based on your age, health, family medical history, use of contact lenses, and other factors. Follow your health care provider's recommendations for frequency of eye exams.  Eat a healthy diet. Foods such as vegetables, fruits, whole grains, low-fat dairy products, and lean protein foods contain the nutrients you need without too many calories. Decrease your intake of foods high in solid fats, added sugars, and salt. Eat the right amount of calories for you.Get information about a proper diet from your health care provider, if necessary.  Regular physical exercise is one of the most important things you can do for your health. Most adults should get at least 150 minutes of moderate-intensity exercise (any activity that increases your heart rate and causes you to sweat) each week. In addition, most adults need muscle-strengthening exercises on 2 or more days a week.  Maintain a healthy weight. The body mass index (BMI) is a screening tool to identify possible weight problems. It provides an estimate of body fat based on height and  weight. Your health care provider can find your BMI and can help you achieve or maintain a healthy weight.For adults 20 years and older:  A BMI below 18.5 is considered underweight.  A BMI of 18.5 to 24.9 is normal.  A BMI of 25 to 29.9 is considered overweight.  A BMI of 30 and above is considered obese.  Maintain normal blood lipids and cholesterol levels by exercising and minimizing your intake of saturated fat. Eat a balanced diet with plenty of fruit and vegetables. Blood tests for lipids and cholesterol should begin at age 74 and be repeated every 5 years. If your lipid or cholesterol levels are high, you are over 50, or you are at high risk for heart disease, you may need your cholesterol levels checked more frequently.Ongoing high lipid and cholesterol levels should be treated with medicines if diet and exercise are not working.  If you smoke, find out from your health care provider how to quit. If you do not use tobacco, do not start.  Lung cancer screening is recommended for adults aged 76-80 years who are at high risk for developing lung cancer because of a history of smoking. A yearly low-dose CT scan of the lungs is recommended for people who have at least a 30-pack-year history of smoking and are a current smoker or have quit within the past 15 years. A pack year of smoking is smoking an average of 1  pack of cigarettes a day for 1 year (for example: 1 pack a day for 30 years or 2 packs a day for 15 years). Yearly screening should continue until the smoker has stopped smoking for at least 15 years. Yearly screening should be stopped for people who develop a health problem that would prevent them from having lung cancer treatment.  If you choose to drink alcohol, do not have more than 2 drinks per day. One drink is considered to be 12 ounces (355 mL) of beer, 5 ounces (148 mL) of wine, or 1.5 ounces (44 mL) of liquor.  Avoid use of street drugs. Do not share needles with anyone. Ask  for help if you need support or instructions about stopping the use of drugs.  High blood pressure causes heart disease and increases the risk of stroke. Your blood pressure should be checked at least every 1-2 years. Ongoing high blood pressure should be treated with medicines, if weight loss and exercise are not effective.  If you are 34-5 years old, ask your health care provider if you should take aspirin to prevent heart disease.  Diabetes screening involves taking a blood sample to check your fasting blood sugar level. This should be done once every 3 years, after age 3, if you are within normal weight and without risk factors for diabetes. Testing should be considered at a younger age or be carried out more frequently if you are overweight and have at least 1 risk factor for diabetes.  Colorectal cancer can be detected and often prevented. Most routine colorectal cancer screening begins at the age of 5 and continues through age 24. However, your health care provider may recommend screening at an earlier age if you have risk factors for colon cancer. On a yearly basis, your health care provider may provide home test kits to check for hidden blood in the stool. Use of a small camera at the end of a tube to directly examine the colon (sigmoidoscopy or colonoscopy) can detect the earliest forms of colorectal cancer. Talk to your health care provider about this at age 3, when routine screening begins. Direct exam of the colon should be repeated every 5-10 years through age 61, unless early forms of precancerous polyps or small growths are found.  People who are at an increased risk for hepatitis B should be screened for this virus. You are considered at high risk for hepatitis B if:  You were born in a country where hepatitis B occurs often. Talk with your health care provider about which countries are considered high risk.  Your parents were born in a high-risk country and you have not received a  shot to protect against hepatitis B (hepatitis B vaccine).  You have HIV or AIDS.  You use needles to inject street drugs.  You live with, or have sex with, someone who has hepatitis B.  You are a man who has sex with other men (MSM).  You get hemodialysis treatment.  You take certain medicines for conditions such as cancer, organ transplantation, and autoimmune conditions.  Hepatitis C blood testing is recommended for all people born from [birth year] through 1965 and any individual with known risks for hepatitis C.  Practice safe sex. Use condoms and avoid high-risk sexual practices to reduce the spread of sexually transmitted infections (STIs). STIs include gonorrhea, chlamydia, syphilis, trichomonas, herpes, HPV, and human immunodeficiency virus (HIV). Herpes, HIV, and HPV are viral illnesses that have no cure. They can result in disability, cancer,  and death.  If you are at risk of being infected with HIV, it is recommended that you take a prescription medicine daily to prevent HIV infection. This is called preexposure prophylaxis (PrEP). You are considered at risk if:  You are a man who has sex with other men (MSM) and have other risk factors.  You are a heterosexual man, are sexually active, and are at increased risk for HIV infection.  You take drugs by injection.  You are sexually active with a partner who has HIV.  Talk with your health care provider about whether you are at high risk of being infected with HIV. If you choose to begin PrEP, you should first be tested for HIV. You should then be tested every 3 months for as long as you are taking PrEP.  A one-time screening for abdominal aortic aneurysm (AAA) and surgical repair of large AAAs by ultrasound are recommended for men ages 47 to 20 years who are current or former smokers.  Healthy men should no longer receive prostate-specific antigen (PSA) blood tests as part of routine cancer screening. Talk with your health care  provider about prostate cancer screening.  Testicular cancer screening is not recommended for adult males who have no symptoms. Screening includes self-exam, a health care provider exam, and other screening tests. Consult with your health care provider about any symptoms you have or any concerns you have about testicular cancer.  Use sunscreen. Apply sunscreen liberally and repeatedly throughout the day. You should seek shade when your shadow is shorter than you. Protect yourself by wearing long sleeves, pants, a wide-brimmed hat, and sunglasses year round, whenever you are outdoors.  Once a month, do a whole-body skin exam, using a mirror to look at the skin on your back. Tell your health care provider about new moles, moles that have irregular borders, moles that are larger than a pencil eraser, or moles that have changed in shape or color.  Stay current with required vaccines (immunizations).  Influenza vaccine. All adults should be immunized every year.  Tetanus, diphtheria, and acellular pertussis (Td, Tdap) vaccine. An adult who has not previously received Tdap or who does not know his vaccine status should receive 1 dose of Tdap. This initial dose should be followed by tetanus and diphtheria toxoids (Td) booster doses every 10 years. Adults with an unknown or incomplete history of completing a 3-dose immunization series with Td-containing vaccines should begin or complete a primary immunization series including a Tdap dose. Adults should receive a Td booster every 10 years.  Varicella vaccine. An adult without evidence of immunity to varicella should receive 2 doses or a second dose if he has previously received 1 dose.  Human papillomavirus (HPV) vaccine. Males aged 68-21 years who have not received the vaccine previously should receive the 3-dose series. Males aged 22-26 years may be immunized. Immunization is recommended through the age of 63 years for any male who has sex with males and  did not get any or all doses earlier. Immunization is recommended for any person with an immunocompromised condition through the age of 32 years if he did not get any or all doses earlier. During the 3-dose series, the second dose should be obtained 4-8 weeks after the first dose. The third dose should be obtained 24 weeks after the first dose and 16 weeks after the second dose.  Zoster vaccine. One dose is recommended for adults aged 65 years or older unless certain conditions are present.  Measles, mumps,  and rubella (MMR) vaccine. Adults born before [birth year] generally are considered immune to measles and mumps. Adults born in [birth year] or later should have 1 or more doses of MMR vaccine unless there is a contraindication to the vaccine or there is laboratory evidence of immunity to each of the three diseases. A routine second dose of MMR vaccine should be obtained at least 28 days after the first dose for students attending postsecondary schools, health care workers, or international travelers. People who received inactivated measles vaccine or an unknown type of measles vaccine during 1963-1967 should receive 2 doses of MMR vaccine. People who received inactivated mumps vaccine or an unknown type of mumps vaccine before 1979 and are at high risk for mumps infection should consider immunization with 2 doses of MMR vaccine. Unvaccinated health care workers born before [birth year] who lack laboratory evidence of measles, mumps, or rubella immunity or laboratory confirmation of disease should consider measles and mumps immunization with 2 doses of MMR vaccine or rubella immunization with 1 dose of MMR vaccine.  Pneumococcal 13-valent conjugate (PCV13) vaccine. When indicated, a person who is uncertain of his immunization history and has no record of immunization should receive the PCV13 vaccine. An adult aged 64 years or older who has certain medical conditions and has not been previously immunized should receive 1 dose of  PCV13 vaccine. This PCV13 should be followed with a dose of pneumococcal polysaccharide (PPSV23) vaccine. The PPSV23 vaccine dose should be obtained at least 8 weeks after the dose of PCV13 vaccine. An adult aged 58 years or older who has certain medical conditions and previously received 1 or more doses of PPSV23 vaccine should receive 1 dose of PCV13. The PCV13 vaccine dose should be obtained 1 or more years after the last PPSV23 vaccine dose.  Pneumococcal polysaccharide (PPSV23) vaccine. When PCV13 is also indicated, PCV13 should be obtained first. All adults aged 39 years and older should be immunized. An adult younger than age 105 years who has certain medical conditions should be immunized. Any person who resides in a nursing home or long-term care facility should be immunized. An adult smoker should be immunized. People with an immunocompromised condition and certain other conditions should receive both PCV13 and PPSV23 vaccines. People with human immunodeficiency virus (HIV) infection should be immunized as soon as possible after diagnosis. Immunization during chemotherapy or radiation therapy should be avoided. Routine use of PPSV23 vaccine is not recommended for American Indians, [place]atives, or people younger than 65 years unless there are medical conditions that require PPSV23 vaccine. When indicated, people who have unknown immunization and have no record of immunization should receive PPSV23 vaccine. One-time revaccination 5 years after the first dose of PPSV23 is recommended for people aged 19-64 years who have chronic kidney failure, nephrotic syndrome, asplenia, or immunocompromised conditions. People who received 1-2 doses of PPSV23 before age 28 years should receive another dose of PPSV23 vaccine at age 53 years or later if at least 5 years have passed since the previous dose. Doses of PPSV23 are not needed for people immunized with PPSV23 at or after age 29 years.  Meningococcal vaccine.  Adults with asplenia or persistent complement component deficiencies should receive 2 doses of quadrivalent meningococcal conjugate (MenACWY-D) vaccine. The doses should be obtained at least 2 months apart. Microbiologists working with certain meningococcal bacteria, [place] recruits, people at risk during an outbreak, and people who travel to or live in countries with a high rate of meningitis should be immunized. A first-year  college student up through age 60 years who is living in a residence hall should receive a dose if he did not receive a dose on or after his 16th birthday. Adults who have certain high-risk conditions should receive one or more doses of vaccine.  Hepatitis A vaccine. Adults who wish to be protected from this disease, have certain high-risk conditions, work with hepatitis A-infected animals, work in hepatitis A research labs, or travel to or work in countries with a high rate of hepatitis A should be immunized. Adults who were previously unvaccinated and who anticipate close contact with an international adoptee during the first 60 days after arrival in the [place] from a country with a high rate of hepatitis A should be immunized.  Hepatitis B vaccine. Adults should be immunized if they wish to be protected from this disease, have certain high-risk conditions, may be exposed to blood or other infectious body fluids, are household contacts or sex partners of hepatitis B positive people, are clients or workers in certain care facilities, or travel to or work in countries with a high rate of hepatitis B.  Haemophilus influenzae type b (Hib) vaccine. A previously unvaccinated person with asplenia or sickle cell disease or having a scheduled splenectomy should receive 1 dose of Hib vaccine. Regardless of previous immunization, a recipient of a hematopoietic stem cell transplant should receive a 3-dose series 6-12 months after his successful transplant. Hib vaccine is not recommended  for adults with HIV infection. Preventive Service / Frequency Ages 36 to 51  Blood pressure check.** / Every 1 to 2 years.  Lipid and cholesterol check.** / Every 5 years beginning at age 108.  Hepatitis C blood test.** / For any individual with known risks for hepatitis C.  Skin self-exam. / Monthly.  Influenza vaccine. / Every year.  Tetanus, diphtheria, and acellular pertussis (Tdap, Td) vaccine.** / Consult your health care provider. 1 dose of Td every 10 years.  Varicella vaccine.** / Consult your health care provider.  HPV vaccine. / 3 doses over 6 months, if 57 or younger.  Measles, mumps, rubella (MMR) vaccine.** / You need at least 1 dose of MMR if you were born in 1957 or later. You may also need a second dose.  Pneumococcal 13-valent conjugate (PCV13) vaccine.** / Consult your health care provider.  Pneumococcal polysaccharide (PPSV23) vaccine.** / 1 to 2 doses if you smoke cigarettes or if you have certain conditions.  Meningococcal vaccine.** / 1 dose if you are age 28 to 74 years and a Market researcher living in a residence hall, or have one of several medical conditions. You may also need additional booster doses.  Hepatitis A vaccine.** / Consult your health care provider.  Hepatitis B vaccine.** / Consult your health care provider.  Haemophilus influenzae type b (Hib) vaccine.** / Consult your health care provider. Ages 94 to 4  Blood pressure check.** / Every 1 to 2 years.  Lipid and cholesterol check.** / Every 5 years beginning at age 36.  Lung cancer screening. / Every year if you are aged 57-80 years and have a 30-pack-year history of smoking and currently smoke or have quit within the past 15 years. Yearly screening is stopped once you have quit smoking for at least 15 years or develop a health problem that would prevent you from having lung cancer treatment.  Fecal occult blood test (FOBT) of stool. / Every year beginning at age 72 and  continuing until age 62. You may not have  to do this test if you get a colonoscopy every 10 years.  Flexible sigmoidoscopy** or colonoscopy.** / Every 5 years for a flexible sigmoidoscopy or every 10 years for a colonoscopy beginning at age 50 and continuing until age 7.  Hepatitis C blood test.** / For all people born from [birth year] through 1965 and any individual with known risks for hepatitis C.  Skin self-exam. / Monthly.  Influenza vaccine. / Every year.  Tetanus, diphtheria, and acellular pertussis (Tdap/Td) vaccine.** / Consult your health care provider. 1 dose of Td every 10 years.  Varicella vaccine.** / Consult your health care provider.  Zoster vaccine.** / 1 dose for adults aged 16 years or older.  Measles, mumps, rubella (MMR) vaccine.** / You need at least 1 dose of MMR if you were born in 1955 or later. You may also need a second dose.  Pneumococcal 13-valent conjugate (PCV13) vaccine.** / Consult your health care provider.  Pneumococcal polysaccharide (PPSV23) vaccine.** / 1 to 2 doses if you smoke cigarettes or if you have certain conditions.  Meningococcal vaccine.** / Consult your health care provider.  Hepatitis A vaccine.** / Consult your health care provider.  Hepatitis B vaccine.** / Consult your health care provider.  Haemophilus influenzae type b (Hib) vaccine.** / Consult your health care provider. Ages 3 and over  Blood pressure check.** / Every 1 to 2 years.  Lipid and cholesterol check.**/ Every 5 years beginning at age 30.  Lung cancer screening. / Every year if you are aged 47-80 years and have a 30-pack-year history of smoking and currently smoke or have quit within the past 15 years. Yearly screening is stopped once you have quit smoking for at least 15 years or develop a health problem that would prevent you from having lung cancer treatment.  Fecal occult blood test (FOBT) of stool. / Every year beginning at age 40 and continuing until age 82. You  may not have to do this test if you get a colonoscopy every 10 years.  Flexible sigmoidoscopy** or colonoscopy.** / Every 5 years for a flexible sigmoidoscopy or every 10 years for a colonoscopy beginning at age 24 and continuing until age 11.  Hepatitis C blood test.** / For all people born from [birth year] through 1963 and any individual with known risks for hepatitis C.  Abdominal aortic aneurysm (AAA) screening.** / A one-time screening for ages 16 to 50 years who are current or former smokers.  Skin self-exam. / Monthly.  Influenza vaccine. / Every year.  Tetanus, diphtheria, and acellular pertussis (Tdap/Td) vaccine.** / 1 dose of Td every 10 years.  Varicella vaccine.** / Consult your health care provider.  Zoster vaccine.** / 1 dose for adults aged 69 years or older.  Pneumococcal 13-valent conjugate (PCV13) vaccine.** / Consult your health care provider.  Pneumococcal polysaccharide (PPSV23) vaccine.** / 1 dose for all adults aged 27 years and older.  Meningococcal vaccine.** / Consult your health care provider.  Hepatitis A vaccine.** / Consult your health care provider.  Hepatitis B vaccine.** / Consult your health care provider.  Haemophilus influenzae type b (Hib) vaccine.** / Consult your health care provider. **Family history and personal history of risk and conditions may change your health care provider's recommendations. Document Released: 11/26/2001 Document Revised: 10/05/2013 Document Reviewed: 02/25/2011 [facility] Patient Information 2015 [place], [place]. This information is not intended to replace advice given to you by your health care provider. Make sure you discuss any questions you have with your health care provider.

## 2014-12-23 NOTE — Progress Notes (Signed)
Patient ID: [name], male   DOB: 06-Aug-1926, 81 y.o.   MRN: [id number]    Subjective:   [name] is a 81 y.o. male who presents for an Initial Medicare Annual Wellness Visit and recheck INR   Current Medications (verified) Outpatient Encounter Prescriptions as of 12/23/2014  Medication Sig  . amiodarone (PACERONE) 200 MG tablet TAKE 1 TABLET ONCE DAILY FOR HEART  . amLODipine (NORVASC) 10 MG tablet TAKE 1 TABLET ONCE A DAY  . Calcium Carb-Cholecalciferol (CALCIUM 500 +D PO) Take 1 tablet by mouth daily.   . carvedilol (COREG) 12.5 MG tablet TAKE (1) TABLET TWICE A DAY.  . fenofibrate micronized (ANTARA) 130 MG capsule TAKE 1 CAPSULE DAILY  . fish oil-omega-3 fatty acids 1000 MG capsule Take 2 g by mouth daily.  Marland Kitchen gabapentin (NEURONTIN) 600 MG tablet Take 1 tablet (600 mg total) by mouth 3 (three) times daily.  . hydrochlorothiazide (HYDRODIURIL) 25 MG tablet TAKE 1 TABLET ONCE A DAY  . HYDROcodone-acetaminophen (NORCO) 5-325 MG per tablet One half to one po q 6 hours prn pain  . levothyroxine (SYNTHROID, LEVOTHROID) 150 MCG tablet Take 1 tablet (150 mcg total) by mouth daily.  Marland Kitchen lisinopril (PRINIVIL,ZESTRIL) 40 MG tablet TAKE 1 TABLET ONCE A DAY FOR HIGH BLOOD PRESSURE  . nitroGLYCERIN (NITROSTAT) 0.4 MG SL tablet Place 1 tablet (0.4 mg total) under the tongue every 5 (five) minutes as needed for chest pain.  . NON FORMULARY Ferosul - ferrous sulfate  . tamsulosin (FLOMAX) 0.4 MG CAPS capsule Take 1 capsule (0.4 mg total) by mouth daily.  Marland Kitchen warfarin (COUMADIN) 5 MG tablet TAKE 1 TABLET DAILY OR AS DIRECTED  . zolpidem (AMBIEN) 5 MG tablet TAKE ONE TABLET AT BEDTIME    Allergies (verified) Penicillins   History: Past Medical History  Diagnosis Date  . Dilated cardiomyopathy     Felt to be secondary to atrial fibrillation. EF is about 40%  . Atrial fibrillation     Persistent, treated with amiodarone and cardioversion  . Drug therapy     Chronic Coumadin  therapy  . CAD (coronary artery disease)     Last catheterization in August 2008 demonstrated the LAD 40% stenosis  . Hypertension   . Dyslipidemia   . Hypothyroidism     Possibly related to amiodarone   Past Surgical History  Procedure Laterality Date  . Hip surgery Left     8yo  . Cardiac catheterization  05/2007    LAD 40% stenosis. Stent in the LAD had 20% in-stent restenosis. Followed by 40-50% distal stenosis. 50% diagonal stenosis. 40-50% circumflex stenosis. 70% OM1 stenosis and 30% ostial RCA stenosis. 30% proximal RCA stenosis.  . Coronary stent placement     Family History  Problem Relation Age of Onset  . Adopted: Yes  . Heart attack Mother   . Heart attack Father   . Diabetes Other   . Stroke Other    Social History   Occupational History  . Part Time    Social History Main Topics  . Smoking status: Never Smoker   . Smokeless tobacco: Never Used  . Alcohol Use: No     Comment: quit at age 21yo  . Drug Use: No  . Sexual Activity: No    Do you feel safe at home?  Yes  Current exercise habits: The patient does not participate in regular exercise at present.   Current Dietary issues habits:  "I eat anything" per patient.  No following any  special diet.  Does not add salt to foods but does not avoid high salt foods    Objective:    Today's Vitals   12/23/14 1407  BP: 116/54  Pulse: 60  Height: 5' 5.5" (1.664 m)  Weight: 183 lb (83.008 kg)  PainSc: 8   PainLoc: Leg     Activities of Daily Living In your present state of health, do you have any difficulty performing the following activities: 12/23/2014  Is the patient deaf or have difficulty hearing? N  Hearing N  Vision N  Difficulty concentrating or making decisions Y  Walking or climbing stairs? N  Doing errands, shopping? N  Preparing Food and eating ? N  Using the Toilet? N  In the past six months, have you accidently leaked urine? N  Do you have problems with loss of bowel control? N    Managing your Medications? N  Managing your Finances? N  Housekeeping or managing your Housekeeping? N    Are there smokers in your home (other than you)? No   Cardiac risk factors: advanced age (older than 68 for men, 78 for women), dyslipidemia, hypertension and male gender.  Depression Screen PHQ 2/9 Scores 12/23/2014 12/02/2014 09/15/2014 08/29/2014  PHQ - 2 Score 1 0 0 0    Fall Risk Fall Risk  12/23/2014 12/02/2014 09/15/2014 08/29/2014 05/10/2014  Falls in the past year? Yes Yes No No No  Number falls in past yr: 1 1 - - -  Injury with Fall? No No - - -  Risk for fall due to : Other (Comment) - - - -  Risk for fall due to (comments): neuropathy and pain in lower extremities - - - -    Cognitive Function: MMSE - Mini Mental State Exam 12/23/2014  Orientation to time 5  Orientation to Place 5  Registration 3  Attention/ Calculation 3  Recall 3  Language- name 2 objects 2  Language- repeat 0  Language- follow 3 step command 3  Language- read & follow direction 1  Write a sentence 1  Copy design 1  Total score 27    Immunizations and Health Maintenance Immunization History  Administered Date(s) Administered  . Influenza,inj,Quad PF,36+ Mos 07/05/2013  . Influenza-Unspecified 07/21/2014  . Pneumococcal Conjugate-13 08/01/2014  . Pneumococcal Polysaccharide-23 08/17/1995  . Tdap 10/14/2008   There are no preventive care reminders to display for this patient.  Patient Care Team: Claretta Fraise, MD as PCP - General (Family Medicine) Jacelyn Pi, MD as Attending Physician (Endocrinology)  Indicate any recent Medical Services you may have received from other than [facility] providers in the past year (date may be approximate).    Assessment:    Annual Wellness Visit    Screening Tests Health Maintenance  Topic Date Due  . ZOSTAVAX  03/02/2015 (Originally 01/07/1988)  . COLONOSCOPY  12/03/2019 (Originally 01/06/1978)  . INFLUENZA VACCINE  05/15/2015  . TETANUS/TDAP   10/14/2018  . PNA vac Low Risk Adult  Completed        Plan:   During the course of the visit [name] was educated and counseled about the following appropriate screening and preventive services:   Vaccines to include Pneumoccal, Influenza, Hepatitis B, Td, Zostavax - all vaccines UTD except Zostavax and Hep B.  Patient has declined this at this time due to cost  Colorectal cancer screening - patient refuses colonoscopy but is UTD for FOBT  Cardiovascular disease  - see cardiologist in 2-3 months  Diabetes screening - UTD,,  A1c  was 6.0% - prediabetes  Glaucoma screening - patient has appt next week  Nutrition counseling - discussed limiting CHO intake and limiting serving sizes to prevent onset of diabetes.   Prostate cancer screening - not required at patient's age  89 directives packet given  Checking CK and magnesium related to leg pain / neuropathy.  Patient has had extensive w/u for this by PCP, ortho, neurology and cardio.  As far as medication which could be worsening - possibly amiodarone or fenofibrate.  Recommend hold fenofibrate and see if improves.  Will also send note to cardio to see if alternative to amiodarone is possible.   Anticoagulation Dose Instructions as of 12/23/2014      Dorene Grebe Tue Wed Thu Fri Sat   New Dose 5 mg 7.5 mg 5 mg 5 mg 5 mg 7.5 mg 5 mg    Description        Increase warfarin dose of 1 tablet daily except 1 and 1/2 on mondays and fridays.      RTC in 2  weeks   Goals    . Increase physical activity    . Reduce sugar intake and carbohydrate intake     Limit sweets, bread, potatoes, corn and peas. Serving size 1/2 cup or less. Increase no starchy vegetables such as carrots, cauliflower, lettuce, cabbage, tomatoes, green beans. Hervey Ard are ok but no more than 1-2 servings per week due to warfarin.         Patient Instructions (the written plan) were given to the patient.   Cherre Robins, [facility]   12/23/2014

## 2014-12-24 LAB — MAGNESIUM: Magnesium: 2.2 mg/dL (ref 1.6–2.3)

## 2014-12-24 LAB — CK TOTAL AND CKMB (NOT AT ARMC)
CK MB INDEX: 3.4 ng/mL (ref 0.0–10.4)
Total CK: 130 U/L (ref 24–204)

## 2015-01-13 ENCOUNTER — Other Ambulatory Visit: Payer: Self-pay | Admitting: Pharmacist

## 2015-01-13 MED ORDER — FENOFIBRATE MICRONIZED 134 MG PO CAPS: 134.0000 mg | ORAL_CAPSULE | Freq: Every day | ORAL | Status: DC

## 2015-01-13 NOTE — Telephone Encounter (Signed)
Preferred alternatives - fenofibrate 134mg  or gemfibrozil 600mg . Recommended changes to fenofibrate 134mg  1 tablet once daily. Rx sent to [facility]

## 2015-01-13 NOTE — Telephone Encounter (Signed)
Opened wrong patient record.

## 2015-01-18 ENCOUNTER — Other Ambulatory Visit: Payer: Self-pay | Admitting: Family Medicine

## 2015-01-19 ENCOUNTER — Ambulatory Visit (INDEPENDENT_AMBULATORY_CARE_PROVIDER_SITE_OTHER): Payer: Medicare Other | Admitting: Pharmacist

## 2015-01-19 DIAGNOSIS — I480 Paroxysmal atrial fibrillation: Secondary | ICD-10-CM

## 2015-01-19 DIAGNOSIS — I4891 Unspecified atrial fibrillation: Secondary | ICD-10-CM | POA: Diagnosis not present

## 2015-01-19 LAB — POCT INR: INR: 2.5

## 2015-01-19 NOTE — Patient Instructions (Addendum)
Anticoagulation Dose Instructions as of 01/19/2015      [name] Tue Wed Thu Fri Sat   New Dose 5 mg 7.5 mg 5 mg 5 mg 5 mg 7.5 mg 5 mg    Description        Continue warfarin dose of 1 tablet daily except 1 and 1/2 on mondays and fridays.      Stop amiodarone & vitamin b12.

## 2015-01-19 NOTE — Progress Notes (Signed)
RE: amiodarone  Received: 6 days ago    Minus Breeding, MD  Cherre Robins, [name]. In this situation I would get rid of the amio and see how he does. If we have recurrent fib we will deal with it. Thanks. Jake       Previous Messages     ----- Message -----   From: Cherre Robins, PHARMD   Sent: 01/13/2015  9:24 AM    To: Minus Breeding, MD  Subject: amiodarone                    Dr Jenkins Rouge,  For the last 2 years [name] has complained of paresthesia and neuropathy of face and lower extremeties. He was been referred to neurology and tried several treatments without improvement.   I am concerned that his amiodarone might be causing this or making it worse. I know that once someone is controlled on amiodarone discontinuation is rare. However I would like you advise and consideration if an alternative would be possible.   Thanks,  Cherre Robins, PharmD, CPP

## 2015-01-24 DIAGNOSIS — M25422 Effusion, left elbow: Secondary | ICD-10-CM | POA: Diagnosis not present

## 2015-01-30 ENCOUNTER — Ambulatory Visit (INDEPENDENT_AMBULATORY_CARE_PROVIDER_SITE_OTHER): Payer: Medicare Other | Admitting: Pharmacist

## 2015-01-30 NOTE — Progress Notes (Signed)
Patient did not stop amiodarone as instructed so no need to recheck protime at this time.   Patient instructed to stop amiodarone and RTC in 2 weeks to have protime rechecked.

## 2015-01-31 ENCOUNTER — Encounter: Payer: Self-pay | Admitting: Family Medicine

## 2015-01-31 ENCOUNTER — Ambulatory Visit (INDEPENDENT_AMBULATORY_CARE_PROVIDER_SITE_OTHER): Payer: Medicare Other | Admitting: Family Medicine

## 2015-01-31 VITALS — BP 140/56 | HR 52 | Temp 97.1°F | Ht 65.5 in | Wt 183.0 lb

## 2015-01-31 DIAGNOSIS — M7022 Olecranon bursitis, left elbow: Secondary | ICD-10-CM

## 2015-01-31 DIAGNOSIS — I10 Essential (primary) hypertension: Secondary | ICD-10-CM

## 2015-01-31 DIAGNOSIS — I482 Chronic atrial fibrillation, unspecified: Secondary | ICD-10-CM

## 2015-01-31 DIAGNOSIS — I70219 Atherosclerosis of native arteries of extremities with intermittent claudication, unspecified extremity: Secondary | ICD-10-CM | POA: Diagnosis not present

## 2015-01-31 NOTE — Patient Instructions (Signed)
DASH Eating Plan °DASH stands for "Dietary Approaches to Stop Hypertension." The DASH eating plan is a healthy eating plan that has been shown to reduce high blood pressure (hypertension). Additional health benefits may include reducing the risk of type 2 diabetes mellitus, heart disease, and stroke. The DASH eating plan may also help with weight loss. °WHAT DO I NEED TO KNOW ABOUT THE DASH EATING PLAN? °For the DASH eating plan, you will follow these general guidelines: °· Choose foods with a percent daily value for sodium of less than 5% (as listed on the food label). °· Use salt-free seasonings or herbs instead of table salt or sea salt. °· Check with your health care provider or pharmacist before using salt substitutes. °· Eat lower-sodium products, often labeled as "lower sodium" or "no salt added." °· Eat fresh foods. °· Eat more vegetables, fruits, and low-fat dairy products. °· Choose whole grains. Look for the word "whole" as the first word in the ingredient list. °· Choose fish and skinless chicken or turkey more often than red meat. Limit fish, poultry, and meat to 6 oz (170 g) each day. °· Limit sweets, desserts, sugars, and sugary drinks. °· Choose heart-healthy fats. °· Limit cheese to 1 oz (28 g) per day. °· Eat more home-cooked food and less restaurant, buffet, and fast food. °· Limit fried foods. °· Cook foods using methods other than frying. °· Limit canned vegetables. If you do use them, rinse them well to decrease the sodium. °· When eating at a restaurant, ask that your food be prepared with less salt, or no salt if possible. °WHAT FOODS CAN I EAT? °Seek help from a dietitian for individual calorie needs. °Grains °Whole grain or whole wheat bread. Brown rice. Whole grain or whole wheat pasta. Quinoa, bulgur, and whole grain cereals. Low-sodium cereals. Corn or whole wheat flour tortillas. Whole grain cornbread. Whole grain crackers. Low-sodium crackers. °Vegetables °Fresh or frozen vegetables  (raw, steamed, roasted, or grilled). Low-sodium or reduced-sodium tomato and vegetable juices. Low-sodium or reduced-sodium tomato sauce and paste. Low-sodium or reduced-sodium canned vegetables.  °Fruits °All fresh, canned (in natural juice), or frozen fruits. °Meat and Other Protein Products °Ground beef (85% or leaner), grass-fed beef, or beef trimmed of fat. Skinless chicken or turkey. Ground chicken or turkey. Pork trimmed of fat. All fish and seafood. Eggs. Dried beans, peas, or lentils. Unsalted nuts and seeds. Unsalted canned beans. °Dairy °Low-fat dairy products, such as skim or 1% milk, 2% or reduced-fat cheeses, low-fat ricotta or cottage cheese, or plain low-fat yogurt. Low-sodium or reduced-sodium cheeses. °Fats and Oils °Tub margarines without trans fats. Light or reduced-fat mayonnaise and salad dressings (reduced sodium). Avocado. Safflower, olive, or canola oils. Natural peanut or almond butter. °Other °Unsalted popcorn and pretzels. °The items listed above may not be a complete list of recommended foods or beverages. Contact your dietitian for more options. °WHAT FOODS ARE NOT RECOMMENDED? °Grains °White bread. White pasta. White rice. Refined cornbread. Bagels and croissants. Crackers that contain trans fat. °Vegetables °Creamed or fried vegetables. Vegetables in a cheese sauce. Regular canned vegetables. Regular canned tomato sauce and paste. Regular tomato and vegetable juices. °Fruits °Dried fruits. Canned fruit in light or heavy syrup. Fruit juice. °Meat and Other Protein Products °Fatty cuts of meat. Ribs, chicken wings, bacon, sausage, bologna, salami, chitterlings, fatback, hot dogs, bratwurst, and packaged luncheon meats. Salted nuts and seeds. Canned beans with salt. °Dairy °Whole or 2% milk, cream, half-and-half, and cream cheese. Whole-fat or sweetened yogurt. Full-fat   cheeses or blue cheese. Nondairy creamers and whipped toppings. Processed cheese, cheese spreads, or cheese  curds. °Condiments °Onion and garlic salt, seasoned salt, table salt, and sea salt. Canned and packaged gravies. Worcestershire sauce. Tartar sauce. Barbecue sauce. Teriyaki sauce. Soy sauce, including reduced sodium. Steak sauce. Fish sauce. Oyster sauce. Cocktail sauce. Horseradish. Ketchup and mustard. Meat flavorings and tenderizers. Bouillon cubes. Hot sauce. Tabasco sauce. Marinades. Taco seasonings. Relishes. °Fats and Oils °Butter, stick margarine, lard, shortening, ghee, and bacon fat. Coconut, palm kernel, or palm oils. Regular salad dressings. °Other °Pickles and olives. Salted popcorn and pretzels. °The items listed above may not be a complete list of foods and beverages to avoid. Contact your dietitian for more information. °WHERE CAN I FIND MORE INFORMATION? °National Heart, Lung, and Blood Institute: www.nhlbi.nih.gov/health/health-topics/topics/dash/ °Document Released: 09/19/2011 Document Revised: 02/14/2014 Document Reviewed: 08/04/2013 °ExitCare® Patient Information ©2015 [employer]. This information is not intended to replace advice given to you by your health care provider. Make sure you discuss any questions you have with your health care provider. ° °

## 2015-01-31 NOTE — Progress Notes (Signed)
Subjective:    Patient ID: [name], male    DOB: 1926/04/05, 81 y.o.   MRN: [id number]  HPI  Chief Complaint  Patient presents with  . Elbow Pain    left elbow pain and swelling. No known injury. Had fluid drawn off at urgent care 3 weeks ago.   patient says that some of the fluid history accumulated but there is no pain and he has full range of motion of the elbow.  Patient has additional concern that he is having cramping in the legs with ambulation. This is been going on for several months. It is getting worse. It is moderate in severity. He is attending Coumadin clinic monthly for his atrial fibrillation he has not had any abnormal bleeding particularly no bruising no bleeding from the nose, gums urine or rectum. Patient Active Problem List   Diagnosis Date Noted  . Pre-diabetes 12/23/2014  . Hypothyroid 11/18/2013  . Right sided weakness 10/05/2013  . Long term (current) use of anticoagulants 10/05/2013  . Paresthesias 10/05/2013  . Paroxysmal atrial fibrillation 01/25/2013  . Tingling 11/20/2011  . Bruit 11/20/2011  . CAD 12/12/2010  . DYSLIPIDEMIA 01/05/2009  . HYPERTENSION 01/05/2009  . CARDIOMYOPATHY 01/05/2009   Outpatient Encounter Prescriptions as of 01/31/2015  Medication Sig  . amLODipine (NORVASC) 10 MG tablet TAKE 1 TABLET ONCE A DAY  . Calcium Carb-Cholecalciferol (CALCIUM 500 +D PO) Take 1 tablet by mouth daily.   . carvedilol (COREG) 12.5 MG tablet TAKE (1) TABLET TWICE A DAY.  . fenofibrate micronized (LOFIBRA) 134 MG capsule Take 1 capsule (134 mg total) by mouth daily before breakfast.  . fish oil-omega-3 fatty acids 1000 MG capsule Take 2 g by mouth daily.  Marland Kitchen gabapentin (NEURONTIN) 600 MG tablet Take 1 tablet (600 mg total) by mouth 3 (three) times daily.  . hydrochlorothiazide (HYDRODIURIL) 25 MG tablet TAKE 1 TABLET ONCE A DAY  . HYDROcodone-acetaminophen (NORCO) 5-325 MG per tablet One half to one po q 6 hours prn pain  . levothyroxine  (SYNTHROID, LEVOTHROID) 150 MCG tablet TAKE 1 TABLET DAILY  . lisinopril (PRINIVIL,ZESTRIL) 40 MG tablet TAKE 1 TABLET ONCE A DAY FOR HIGH BLOOD PRESSURE  . nitroGLYCERIN (NITROSTAT) 0.4 MG SL tablet Place 1 tablet (0.4 mg total) under the tongue every 5 (five) minutes as needed for chest pain.  . NON FORMULARY Ferosul - ferrous sulfate  . tamsulosin (FLOMAX) 0.4 MG CAPS capsule Take 1 capsule (0.4 mg total) by mouth daily.  Marland Kitchen warfarin (COUMADIN) 5 MG tablet TAKE 1 TABLET DAILY OR AS DIRECTED  . zolpidem (AMBIEN) 5 MG tablet TAKE ONE TABLET AT BEDTIME     Review of Systems  Constitutional: Negative for fever, chills, diaphoresis and unexpected weight change.  HENT: Negative for congestion, hearing loss, rhinorrhea, sore throat and trouble swallowing.   Respiratory: Negative for cough, chest tightness, shortness of breath and wheezing.   Gastrointestinal: Negative for nausea, vomiting, abdominal pain, diarrhea, constipation and abdominal distention.  Endocrine: Negative for cold intolerance and heat intolerance.  Genitourinary: Negative for dysuria, hematuria and flank pain.  Musculoskeletal: Negative for joint swelling and arthralgias.  Skin: Negative for rash.  Neurological: Negative for dizziness and headaches.  Psychiatric/Behavioral: Negative for dysphoric mood, decreased concentration and agitation. The patient is not nervous/anxious.        Objective:   Physical Exam  Constitutional: He appears well-developed and well-nourished.  HENT:  Head: Normocephalic and atraumatic.  Right Ear: Tympanic membrane and external ear normal. No decreased  hearing is noted.  Left Ear: Tympanic membrane and external ear normal. No decreased hearing is noted.  Mouth/Throat: No oropharyngeal exudate or posterior oropharyngeal erythema.  Eyes: Pupils are equal, round, and reactive to light.  Neck: Normal range of motion. Neck supple.  Cardiovascular: Normal rate and regular rhythm.   No murmur  heard. Pulmonary/Chest: Breath sounds normal. No respiratory distress.  Abdominal: Soft. Bowel sounds are normal. He exhibits no mass. There is no tenderness.  Vitals reviewed.   BP 140/56 mmHg  Pulse 52  Temp(Src) 97.1 F (36.2 C) (Oral)  Ht 5' 5.5" (1.664 m)  Wt 183 lb (83.008 kg)  BMI 29.98 kg/m2        Assessment & Plan:   1. Atherosclerosis of native arteries of extremity with intermittent claudication   2. Essential hypertension, benign   3. Chronic atrial fibrillation   4. Olecranon bursitis, left     No orders of the defined types were placed in this encounter.    Orders Placed This Encounter  Procedures  . Lower Extremity Arterial Doppler    Standing Status: Future     Number of Occurrences:      Standing Expiration Date: 01/31/2016    Order Specific Question:  Where should this test be performed:    Answer:  [facility]   monitor the olecranon bursitis for worsening if the fluid re-accumulates orthopedic referral may be necessary for excision of the bursa.  Labs pending Health Maintenance reviewed Diet and exercise encouraged Continue all meds as discussed Follow up in 3 months  Claretta Fraise, MD

## 2015-02-02 ENCOUNTER — Telehealth: Payer: Self-pay

## 2015-02-02 NOTE — Telephone Encounter (Signed)
Per message from [place]. [name] needs to have arterial lower in the [place] office. Called his home. No answer. Left a message asking him to return call.

## 2015-02-06 NOTE — Telephone Encounter (Signed)
Scheduled May 4th at [facility]

## 2015-02-10 ENCOUNTER — Other Ambulatory Visit: Payer: Self-pay | Admitting: Family Medicine

## 2015-02-11 NOTE — Telephone Encounter (Signed)
Last seen 01/31/15, last filled 01/31/15. Call into [facility]

## 2015-02-12 DIAGNOSIS — M25422 Effusion, left elbow: Secondary | ICD-10-CM | POA: Diagnosis not present

## 2015-02-13 ENCOUNTER — Telehealth: Payer: Self-pay | Admitting: *Deleted

## 2015-02-13 ENCOUNTER — Other Ambulatory Visit: Payer: Self-pay | Admitting: Family Medicine

## 2015-02-13 ENCOUNTER — Encounter (HOSPITAL_COMMUNITY): Payer: Self-pay | Admitting: Emergency Medicine

## 2015-02-13 ENCOUNTER — Emergency Department (HOSPITAL_COMMUNITY)
Admission: EM | Admit: 2015-02-13 | Discharge: 2015-02-13 | Disposition: A | Discharge status: Home / Self Care | Payer: Medicare Other | Attending: Emergency Medicine | Admitting: Emergency Medicine

## 2015-02-13 DIAGNOSIS — Z79899 Other long term (current) drug therapy: Secondary | ICD-10-CM | POA: Insufficient documentation

## 2015-02-13 DIAGNOSIS — Y9389 Activity, other specified: Secondary | ICD-10-CM | POA: Diagnosis not present

## 2015-02-13 DIAGNOSIS — Z9861 Coronary angioplasty status: Secondary | ICD-10-CM | POA: Diagnosis not present

## 2015-02-13 DIAGNOSIS — E039 Hypothyroidism, unspecified: Secondary | ICD-10-CM | POA: Diagnosis not present

## 2015-02-13 DIAGNOSIS — I10 Essential (primary) hypertension: Secondary | ICD-10-CM | POA: Insufficient documentation

## 2015-02-13 DIAGNOSIS — I4891 Unspecified atrial fibrillation: Secondary | ICD-10-CM | POA: Diagnosis not present

## 2015-02-13 DIAGNOSIS — M25422 Effusion, left elbow: Secondary | ICD-10-CM | POA: Diagnosis present

## 2015-02-13 DIAGNOSIS — M7022 Olecranon bursitis, left elbow: Secondary | ICD-10-CM

## 2015-02-13 DIAGNOSIS — I2510 Atherosclerotic heart disease of native coronary artery without angina pectoris: Secondary | ICD-10-CM | POA: Insufficient documentation

## 2015-02-13 DIAGNOSIS — M25522 Pain in left elbow: Secondary | ICD-10-CM

## 2015-02-13 DIAGNOSIS — Z9889 Other specified postprocedural states: Secondary | ICD-10-CM | POA: Diagnosis not present

## 2015-02-13 DIAGNOSIS — Z880 Allergy status to penicillin: Secondary | ICD-10-CM | POA: Insufficient documentation

## 2015-02-13 DIAGNOSIS — Z7901 Long term (current) use of anticoagulants: Secondary | ICD-10-CM | POA: Diagnosis not present

## 2015-02-13 DIAGNOSIS — I739 Peripheral vascular disease, unspecified: Secondary | ICD-10-CM

## 2015-02-13 MED ORDER — PREDNISONE 50 MG PO TABS: ORAL_TABLET | ORAL | Status: DC

## 2015-02-13 MED ORDER — NAPROXEN 250 MG PO TABS
500.0000 mg | ORAL_TABLET | Freq: Once | ORAL | Status: AC
  Administered 2015-02-13: 500 mg via ORAL
  Filled 2015-02-13: qty 2

## 2015-02-13 MED ORDER — PREDNISONE 50 MG PO TABS
50.0000 mg | ORAL_TABLET | Freq: Once | ORAL | Status: AC
  Administered 2015-02-13: 50 mg via ORAL
  Filled 2015-02-13: qty 1

## 2015-02-13 MED ORDER — NAPROXEN 500 MG PO TABS: 500.0000 mg | ORAL_TABLET | Freq: Two times a day (BID) | ORAL | Status: DC

## 2015-02-13 NOTE — Telephone Encounter (Signed)
Please review and advise.

## 2015-02-13 NOTE — Telephone Encounter (Signed)
Referral has been made and patient wants to go somewhere today if not he is going to go to ER.

## 2015-02-13 NOTE — Discharge Instructions (Signed)
You have a condition called olecranon bursitis.    Prescriptions for prednisone and anti-inflammatory medication. Rest your elbow. Ice pack. If symptoms do not improve in 7 to 10 days, recommend referral to orthopedic doctor. Phone number given.

## 2015-02-13 NOTE — ED Provider Notes (Signed)
CSN: [id number]     Arrival date & time 02/13/15  1226 History   First MD Initiated Contact with Patient 02/13/15 1254     Chief Complaint  Patient presents with  . Joint Swelling     (Consider location/radiation/quality/duration/timing/severity/associated sxs/prior Treatment) HPI.... Swelling at tip of left elbow for several weeks.  Patient has had 1 previous visit to the urgent care center and fluid was drawn off. He does a lot of mowing and flexing of his elbow. No other injuries. No fever, sweats, chills. Severity is mild to moderate. Palpation makes pain worse  Past Medical History  Diagnosis Date  . Dilated cardiomyopathy     Felt to be secondary to atrial fibrillation. EF is about 40%  . Atrial fibrillation     Persistent, treated with amiodarone and cardioversion  . Drug therapy     Chronic Coumadin therapy  . CAD (coronary artery disease)     Last catheterization in August 2008 demonstrated the LAD 40% stenosis  . Hypertension   . Dyslipidemia   . Hypothyroidism     Possibly related to amiodarone   Past Surgical History  Procedure Laterality Date  . Hip surgery Left     8yo  . Cardiac catheterization  05/2007    LAD 40% stenosis. Stent in the LAD had 20% in-stent restenosis. Followed by 40-50% distal stenosis. 50% diagonal stenosis. 40-50% circumflex stenosis. 70% OM1 stenosis and 30% ostial RCA stenosis. 30% proximal RCA stenosis.  . Coronary stent placement     Family History  Problem Relation Age of Onset  . Adopted: Yes  . Heart attack Mother   . Heart attack Father   . Diabetes Other   . Stroke Other    History  Substance Use Topics  . Smoking status: Never Smoker   . Smokeless tobacco: Never Used  . Alcohol Use: No     Comment: quit at age 3yo    Review of Systems  All other systems reviewed and are negative.     Allergies  Penicillins  Home Medications   Prior to Admission medications   Medication Sig Start Date End Date Taking?  Authorizing Provider  amLODipine (NORVASC) 10 MG tablet TAKE 1 TABLET ONCE A DAY 11/08/14  Yes Minus Breeding, MD  Calcium Carb-Cholecalciferol (CALCIUM 500 +D PO) Take 1 tablet by mouth daily.    Yes Historical Provider, MD  CALCIUM PO Take 1 tablet by mouth daily.   Yes Historical Provider, MD  carvedilol (COREG) 12.5 MG tablet TAKE (1) TABLET TWICE A DAY. 09/12/14  Yes Lysbeth Penner, FNP  fenofibrate micronized (LOFIBRA) 134 MG capsule Take 1 capsule (134 mg total) by mouth daily before breakfast. 01/13/15  Yes Tammy Eckard, PHARMD  fish oil-omega-3 fatty acids 1000 MG capsule Take 2 g by mouth daily.   Yes Historical Provider, MD  gabapentin (NEURONTIN) 300 MG capsule Take 300 mg by mouth 3 (three) times daily.   Yes Historical Provider, MD  hydrochlorothiazide (HYDRODIURIL) 25 MG tablet TAKE 1 TABLET ONCE A DAY 12/07/14  Yes Chipper Herb, MD  ibuprofen (ADVIL,MOTRIN) 200 MG tablet Take 400 mg by mouth every 6 (six) hours as needed for moderate pain.   Yes Historical Provider, MD  levothyroxine (SYNTHROID, LEVOTHROID) 150 MCG tablet TAKE 1 TABLET DAILY 01/19/15  Yes Claretta Fraise, MD  lisinopril (PRINIVIL,ZESTRIL) 40 MG tablet TAKE 1 TABLET ONCE A DAY FOR HIGH BLOOD PRESSURE 11/30/14  Yes Chipper Herb, MD  nitroGLYCERIN (NITROSTAT) 0.4 MG SL tablet  Place 1 tablet (0.4 mg total) under the tongue every 5 (five) minutes as needed for chest pain. 08/15/14  Yes Lysbeth Penner, FNP  tamsulosin (FLOMAX) 0.4 MG CAPS capsule Take 1 capsule (0.4 mg total) by mouth daily. 12/07/14  Yes Chipper Herb, MD  warfarin (COUMADIN) 5 MG tablet Take 5-7.5 mg by mouth daily. Take 5 mg once daily except on Monday and Friday take 7.5 mg.   Yes Historical Provider, MD  gabapentin (NEURONTIN) 600 MG tablet Take 1 tablet (600 mg total) by mouth 3 (three) times daily. Patient not taking: Reported on 02/13/2015 12/02/14   Claretta Fraise, MD  HYDROcodone-acetaminophen [facility]) 5-325 MG per tablet One half to one po q 6 hours  prn pain Patient not taking: Reported on 02/13/2015 09/15/14   Lysbeth Penner, FNP  naproxen (NAPROSYN) 500 MG tablet Take 1 tablet (500 mg total) by mouth 2 (two) times daily. 02/13/15   Nat Christen, MD  predniSONE (DELTASONE) 50 MG tablet One tab for 4 days;  One half tab for 4 days 02/13/15   Nat Christen, MD  warfarin (COUMADIN) 5 MG tablet TAKE 1 TABLET DAILY OR AS DIRECTED Patient not taking: Reported on 02/13/2015 11/30/14   Chipper Herb, MD  zolpidem (AMBIEN) 5 MG tablet TAKE ONE TABLET AT BEDTIME Patient not taking: Reported on 02/13/2015 02/13/15   Claretta Fraise, MD   BP 124/93 mmHg  Pulse 51  Temp(Src) 97.7 F (36.5 C) (Oral)  Resp 18  Ht 5\' 6"  (1.676 m)  Wt 182 lb (82.555 kg)  BMI 29.39 kg/m2  SpO2 98% Physical Exam  Constitutional: He is oriented to person, place, and time. He appears well-developed and well-nourished.  HENT:  Head: Normocephalic and atraumatic.  Musculoskeletal:  Left upper extremity:  Swelling at tip approximate the size of 2/3  golf ball. Neurovascular intact.  Neurological: He is alert and oriented to person, place, and time.  Skin: Skin is warm and dry.  Psychiatric: He has a normal mood and affect. His behavior is normal.    ED Course  Procedures (including critical care time) Labs Review Labs Reviewed - No data to display  Imaging Review No results found.   EKG Interpretation None      MDM   Final diagnoses:  Olecranon bursitis, left    History and physical consistent with olecranon bursitis. Rx prednisone and Naprosyn 500 mg. Referral to orthopedics.    Nat Christen, MD 02/13/15 1341

## 2015-02-13 NOTE — Telephone Encounter (Signed)
Pt is having continuing worsening elbow  Wants ortho referral today Please advise

## 2015-02-13 NOTE — Telephone Encounter (Signed)
Please refer to orthopedics. See if he needs something to relieve his pain in the meantime.

## 2015-02-13 NOTE — ED Notes (Signed)
Pt reports left elbow swelling 4-5 weeks ago. Pt reports had fluid drawn off at that time and reports swelling has come back and reports is now painful. Pt denies any new or recent injury.

## 2015-02-13 NOTE — ED Notes (Signed)
Pt has swollen area to left elbow.  States some pain with movement.  States that he mows grass several times weekly and has been acting up the more he mows.  Denies other complaints.

## 2015-02-14 ENCOUNTER — Ambulatory Visit (INDEPENDENT_AMBULATORY_CARE_PROVIDER_SITE_OTHER): Payer: Medicare Other | Admitting: Pharmacist Clinician (PhC)/ Clinical Pharmacy Specialist

## 2015-02-14 DIAGNOSIS — I482 Chronic atrial fibrillation, unspecified: Secondary | ICD-10-CM

## 2015-02-14 DIAGNOSIS — I480 Paroxysmal atrial fibrillation: Secondary | ICD-10-CM

## 2015-02-14 LAB — POCT INR: INR: 2

## 2015-02-15 ENCOUNTER — Ambulatory Visit (INDEPENDENT_AMBULATORY_CARE_PROVIDER_SITE_OTHER): Payer: Medicare Other

## 2015-02-15 DIAGNOSIS — I739 Peripheral vascular disease, unspecified: Secondary | ICD-10-CM | POA: Diagnosis not present

## 2015-02-16 NOTE — Telephone Encounter (Signed)
RX called into pharmacy

## 2015-02-27 ENCOUNTER — Other Ambulatory Visit: Payer: Self-pay | Admitting: Family Medicine

## 2015-03-06 ENCOUNTER — Encounter: Payer: Self-pay | Admitting: Family Medicine

## 2015-03-06 ENCOUNTER — Ambulatory Visit (INDEPENDENT_AMBULATORY_CARE_PROVIDER_SITE_OTHER): Payer: Medicare Other | Admitting: Family Medicine

## 2015-03-06 VITALS — BP 153/64 | HR 61 | Temp 97.4°F | Wt 187.8 lb

## 2015-03-06 DIAGNOSIS — M1990 Unspecified osteoarthritis, unspecified site: Secondary | ICD-10-CM | POA: Diagnosis not present

## 2015-03-06 DIAGNOSIS — M7022 Olecranon bursitis, left elbow: Secondary | ICD-10-CM

## 2015-03-06 DIAGNOSIS — D649 Anemia, unspecified: Secondary | ICD-10-CM

## 2015-03-06 DIAGNOSIS — G609 Hereditary and idiopathic neuropathy, unspecified: Secondary | ICD-10-CM | POA: Diagnosis not present

## 2015-03-06 MED ORDER — PREDNISONE 10 MG PO TABS: ORAL_TABLET | ORAL | Status: DC

## 2015-03-06 MED ORDER — GABAPENTIN 600 MG PO TABS: 600.0000 mg | ORAL_TABLET | Freq: Three times a day (TID) | ORAL | Status: DC

## 2015-03-06 NOTE — Progress Notes (Signed)
Subjective:  Patient ID: [name], male    DOB: 29-Aug-1926  Age: 81 y.o. MRN: [id number]  CC: Elbow Pain; Knee Pain; and Hypertension   HPI [name] presents for  follow-up of hypertension. Patient has no history of headache chest pain or shortness of breath or recent cough. Patient also denies symptoms of TIA such as numbness weakness lateralizing. Patient checks  blood pressure at home and has not had any elevated readings recently. Patient denies side effects from his medication. States taking it regularly. Additionally the patient has been taking iron and wonders if he needs to continue that. He has had some black stools but denies any blood in his bowel movement. He states that the prednisone has helped relieve the left elbow and had pain in the past he has run out of it and it seems to be coming back in the left knee and although the elbow does not hurt the swelling has resumed. It is not back yet. The left knee pain does impact his ability to perform activities of daily living effectively. He uses a cane or a walker at times.  He does get relief from numbness and tingling in the hands and feet with his gabapentin and would like to continue that medication today.  History [name] has a past medical history of Dilated cardiomyopathy; Atrial fibrillation; Drug therapy; CAD (coronary artery disease); Hypertension; Dyslipidemia; and Hypothyroidism.   He has past surgical history that includes Hip surgery (Left); Cardiac catheterization (05/2007); and Coronary stent placement.   His family history includes Diabetes in his other; Heart attack in his father and mother; Stroke in his other. He was adopted.He reports that he has never smoked. He has never used smokeless tobacco. He reports that he does not drink alcohol or use illicit drugs.  Outpatient Prescriptions Prior to Visit  Medication Sig Dispense Refill  . amLODipine (NORVASC) 10 MG tablet TAKE 1 TABLET ONCE A DAY 30 tablet 5  .  Calcium Carb-Cholecalciferol (CALCIUM 500 +D PO) Take 1 tablet by mouth daily.     Marland Kitchen CALCIUM PO Take 1 tablet by mouth daily.    . carvedilol (COREG) 12.5 MG tablet TAKE (1) TABLET TWICE A DAY. 60 tablet 5  . fenofibrate micronized (LOFIBRA) 134 MG capsule Take 1 capsule (134 mg total) by mouth daily before breakfast. 90 capsule 1  . fish oil-omega-3 fatty acids 1000 MG capsule Take 2 g by mouth daily.    . hydrochlorothiazide (HYDRODIURIL) 25 MG tablet TAKE 1 TABLET ONCE A DAY 90 tablet 1  . HYDROcodone-acetaminophen (NORCO) 5-325 MG per tablet One half to one po q 6 hours prn pain 30 tablet 0  . ibuprofen (ADVIL,MOTRIN) 200 MG tablet Take 400 mg by mouth every 6 (six) hours as needed for moderate pain.    Marland Kitchen levothyroxine (SYNTHROID, LEVOTHROID) 150 MCG tablet TAKE 1 TABLET DAILY 30 tablet 9  . lisinopril (PRINIVIL,ZESTRIL) 40 MG tablet TAKE 1 TABLET ONCE A DAY FOR HIGH BLOOD PRESSURE 30 tablet 3  . naproxen (NAPROSYN) 500 MG tablet Take 1 tablet (500 mg total) by mouth 2 (two) times daily. 14 tablet 0  . nitroGLYCERIN (NITROSTAT) 0.4 MG SL tablet Place 1 tablet (0.4 mg total) under the tongue every 5 (five) minutes as needed for chest pain. 25 tablet 1  . tamsulosin (FLOMAX) 0.4 MG CAPS capsule Take 1 capsule (0.4 mg total) by mouth daily. 30 capsule 5  . warfarin (COUMADIN) 5 MG tablet Take 5-7.5 mg by mouth daily. Take  5 mg once daily except on Monday and Friday take 7.5 mg.    . warfarin (COUMADIN) 5 MG tablet TAKE 1 TABLET DAILY OR AS DIRECTED 32 tablet 5  . zolpidem (AMBIEN) 5 MG tablet TAKE ONE TABLET AT BEDTIME 30 tablet 1  . gabapentin (NEURONTIN) 300 MG capsule Take 300 mg by mouth 3 (three) times daily.    Marland Kitchen gabapentin (NEURONTIN) 600 MG tablet Take 1 tablet (600 mg total) by mouth 3 (three) times daily. 60 tablet 5  . predniSONE (DELTASONE) 50 MG tablet One tab for 4 days;  One half tab for 4 days (Patient not taking: Reported on 03/06/2015) 6 tablet 0   No facility-administered  medications prior to visit.    ROS Review of Systems  Constitutional: Negative for fever, chills and diaphoresis.  HENT: Negative for congestion, rhinorrhea and sore throat.   Respiratory: Negative for cough, shortness of breath and wheezing.   Cardiovascular: Negative for chest pain.  Gastrointestinal: Negative for nausea, vomiting, abdominal pain, diarrhea, constipation and abdominal distention.  Genitourinary: Negative for dysuria and frequency.  Musculoskeletal: Negative for joint swelling and arthralgias.  Skin: Negative for rash.  Neurological: Negative for headaches.    Objective:  BP 153/64 mmHg  Pulse 61  Temp(Src) 97.4 F (36.3 C) (Oral)  Wt 187 lb 12.8 oz (85.186 kg)  BP Readings from Last 3 Encounters:  03/06/15 153/64  02/13/15 120/95  01/31/15 140/56    Wt Readings from Last 3 Encounters:  03/06/15 187 lb 12.8 oz (85.186 kg)  02/13/15 182 lb (82.555 kg)  01/31/15 183 lb (83.008 kg)     Physical Exam  Constitutional: He is oriented to person, place, and time. He appears well-developed and well-nourished. No distress.  HENT:  Head: Normocephalic and atraumatic.  Right Ear: External ear normal.  Left Ear: External ear normal.  Nose: Nose normal.  Mouth/Throat: Oropharynx is clear and moist.  Eyes: Conjunctivae and EOM are normal. Pupils are equal, round, and reactive to light.  Neck: Normal range of motion. Neck supple. No thyromegaly present.  Cardiovascular: Normal rate, regular rhythm and normal heart sounds.   No murmur heard. Pulmonary/Chest: Effort normal and breath sounds normal. No respiratory distress. He has no wheezes. He has no rales.  Abdominal: Soft. Bowel sounds are normal. He exhibits no distension. There is no tenderness.  Musculoskeletal:       Left knee: He exhibits abnormal alignment. He exhibits no effusion, no erythema, no LCL laxity, normal patellar mobility and no MCL laxity. Tenderness found. Medial joint line, lateral joint line  and patellar tendon tenderness noted.       Left forearm: He exhibits tenderness, swelling and edema.  Lymphadenopathy:    He has no cervical adenopathy.  Neurological: He is alert and oriented to person, place, and time. He has normal reflexes.  Skin: Skin is warm and dry.  Psychiatric: He has a normal mood and affect. His behavior is normal. Judgment and thought content normal.    Lab Results  Component Value Date   HGBA1C 6.0% 11/14/2014   HGBA1C 6.0 11/14/2014    Lab Results  Component Value Date   WBC 4.0 12/02/2014   HGB 13.3 12/02/2014   HCT 38.9 12/02/2014   PLT 232 12/02/2014   GLUCOSE 112* 11/14/2014   CHOL 166 08/29/2014   TRIG 318* 08/29/2014   HDL 27* 08/29/2014   LDLCALC 75 08/29/2014   ALT 18 08/29/2014   AST 33 08/29/2014   NA 141 11/14/2014  K 4.8 11/14/2014   CL 105 11/14/2014   CREATININE 1.39* 11/14/2014   BUN 36* 11/14/2014   CO2 22 11/14/2014   TSH 3.690 12/02/2014   INR 2.0 02/14/2015   HGBA1C 6.0% 11/14/2014    No results found.  Assessment & Plan:   [name] was seen today for elbow pain, knee pain and hypertension.  Diagnoses and all orders for this visit:  Hereditary and idiopathic peripheral neuropathy Orders: -     gabapentin (NEURONTIN) 600 MG tablet; Take 1 tablet (600 mg total) by mouth 3 (three) times daily.  Olecranon bursitis, left  Anemia, unspecified anemia type  Arthritis  Other orders -     predniSONE (DELTASONE) 10 MG tablet; Take 5 daily for 3 days followed by 4,3,2 and 1 for 3 days each.   I have discontinued Mr. Mcclenahan predniSONE. I am also having him start on predniSONE. Additionally, I am having him maintain his Calcium Carb-Cholecalciferol (CALCIUM 500 +D PO), fish oil-omega-3 fatty acids, nitroGLYCERIN, carvedilol, HYDROcodone-acetaminophen, amLODipine, lisinopril, tamsulosin, hydrochlorothiazide, fenofibrate micronized, levothyroxine, zolpidem, CALCIUM PO, ibuprofen, warfarin, naproxen, warfarin, and  gabapentin.  Meds ordered this encounter  Medications  . predniSONE (DELTASONE) 10 MG tablet    Sig: Take 5 daily for 3 days followed by 4,3,2 and 1 for 3 days each.    Dispense:  45 tablet    Refill:  0  . gabapentin (NEURONTIN) 600 MG tablet    Sig: Take 1 tablet (600 mg total) by mouth 3 (three) times daily.    Dispense:  60 tablet    Refill:  5     Follow-up: Return in about 3 months (around 06/06/2015) for hypertension, iron.  Claretta Fraise, M.D.

## 2015-03-06 NOTE — Patient Instructions (Signed)
DASH Eating Plan °DASH stands for "Dietary Approaches to Stop Hypertension." The DASH eating plan is a healthy eating plan that has been shown to reduce high blood pressure (hypertension). Additional health benefits may include reducing the risk of type 2 diabetes mellitus, heart disease, and stroke. The DASH eating plan may also help with weight loss. °WHAT DO I NEED TO KNOW ABOUT THE DASH EATING PLAN? °For the DASH eating plan, you will follow these general guidelines: °· Choose foods with a percent daily value for sodium of less than 5% (as listed on the food label). °· Use salt-free seasonings or herbs instead of table salt or sea salt. °· Check with your health care provider or pharmacist before using salt substitutes. °· Eat lower-sodium products, often labeled as "lower sodium" or "no salt added." °· Eat fresh foods. °· Eat more vegetables, fruits, and low-fat dairy products. °· Choose whole grains. Look for the word "whole" as the first word in the ingredient list. °· Choose fish and skinless chicken or turkey more often than red meat. Limit fish, poultry, and meat to 6 oz (170 g) each day. °· Limit sweets, desserts, sugars, and sugary drinks. °· Choose heart-healthy fats. °· Limit cheese to 1 oz (28 g) per day. °· Eat more home-cooked food and less restaurant, buffet, and fast food. °· Limit fried foods. °· Cook foods using methods other than frying. °· Limit canned vegetables. If you do use them, rinse them well to decrease the sodium. °· When eating at a restaurant, ask that your food be prepared with less salt, or no salt if possible. °WHAT FOODS CAN I EAT? °Seek help from a dietitian for individual calorie needs. °Grains °Whole grain or whole wheat bread. Brown rice. Whole grain or whole wheat pasta. Quinoa, bulgur, and whole grain cereals. Low-sodium cereals. Corn or whole wheat flour tortillas. Whole grain cornbread. Whole grain crackers. Low-sodium crackers. °Vegetables °Fresh or frozen vegetables  (raw, steamed, roasted, or grilled). Low-sodium or reduced-sodium tomato and vegetable juices. Low-sodium or reduced-sodium tomato sauce and paste. Low-sodium or reduced-sodium canned vegetables.  °Fruits °All fresh, canned (in natural juice), or frozen fruits. °Meat and Other Protein Products °Ground beef (85% or leaner), grass-fed beef, or beef trimmed of fat. Skinless chicken or turkey. Ground chicken or turkey. Pork trimmed of fat. All fish and seafood. Eggs. Dried beans, peas, or lentils. Unsalted nuts and seeds. Unsalted canned beans. °Dairy °Low-fat dairy products, such as skim or 1% milk, 2% or reduced-fat cheeses, low-fat ricotta or cottage cheese, or plain low-fat yogurt. Low-sodium or reduced-sodium cheeses. °Fats and Oils °Tub margarines without trans fats. Light or reduced-fat mayonnaise and salad dressings (reduced sodium). Avocado. Safflower, olive, or canola oils. Natural peanut or almond butter. °Other °Unsalted popcorn and pretzels. °The items listed above may not be a complete list of recommended foods or beverages. Contact your dietitian for more options. °WHAT FOODS ARE NOT RECOMMENDED? °Grains °White bread. White pasta. White rice. Refined cornbread. Bagels and croissants. Crackers that contain trans fat. °Vegetables °Creamed or fried vegetables. Vegetables in a cheese sauce. Regular canned vegetables. Regular canned tomato sauce and paste. Regular tomato and vegetable juices. °Fruits °Dried fruits. Canned fruit in light or heavy syrup. Fruit juice. °Meat and Other Protein Products °Fatty cuts of meat. Ribs, chicken wings, bacon, sausage, bologna, salami, chitterlings, fatback, hot dogs, bratwurst, and packaged luncheon meats. Salted nuts and seeds. Canned beans with salt. °Dairy °Whole or 2% milk, cream, half-and-half, and cream cheese. Whole-fat or sweetened yogurt. Full-fat   cheeses or blue cheese. Nondairy creamers and whipped toppings. Processed cheese, cheese spreads, or cheese  curds. °Condiments °Onion and garlic salt, seasoned salt, table salt, and sea salt. Canned and packaged gravies. Worcestershire sauce. Tartar sauce. Barbecue sauce. Teriyaki sauce. Soy sauce, including reduced sodium. Steak sauce. Fish sauce. Oyster sauce. Cocktail sauce. Horseradish. Ketchup and mustard. Meat flavorings and tenderizers. Bouillon cubes. Hot sauce. Tabasco sauce. Marinades. Taco seasonings. Relishes. °Fats and Oils °Butter, stick margarine, lard, shortening, ghee, and bacon fat. Coconut, palm kernel, or palm oils. Regular salad dressings. °Other °Pickles and olives. Salted popcorn and pretzels. °The items listed above may not be a complete list of foods and beverages to avoid. Contact your dietitian for more information. °WHERE CAN I FIND MORE INFORMATION? °National Heart, Lung, and Blood Institute: www.nhlbi.nih.gov/health/health-topics/topics/dash/ °Document Released: 09/19/2011 Document Revised: 02/14/2014 Document Reviewed: 08/04/2013 °ExitCare® Patient Information ©2015 [employer]. This information is not intended to replace advice given to you by your health care provider. Make sure you discuss any questions you have with your health care provider. ° °

## 2015-03-21 ENCOUNTER — Other Ambulatory Visit: Payer: Self-pay | Admitting: Family Medicine

## 2015-03-25 DIAGNOSIS — R40241 Glasgow coma scale score 13-15: Secondary | ICD-10-CM | POA: Diagnosis not present

## 2015-03-26 ENCOUNTER — Inpatient Hospital Stay (HOSPITAL_COMMUNITY)
Admission: EM | Admit: 2015-03-26 | Discharge: 2015-03-28 | DRG: 194 | Disposition: A | Discharge status: Home / Self Care | Payer: Medicare Other | Attending: Internal Medicine | Admitting: Internal Medicine

## 2015-03-26 ENCOUNTER — Emergency Department (HOSPITAL_COMMUNITY): Payer: Medicare Other

## 2015-03-26 ENCOUNTER — Encounter (HOSPITAL_COMMUNITY): Payer: Self-pay

## 2015-03-26 DIAGNOSIS — S0093XA Contusion of unspecified part of head, initial encounter: Secondary | ICD-10-CM | POA: Diagnosis not present

## 2015-03-26 DIAGNOSIS — J441 Chronic obstructive pulmonary disease with (acute) exacerbation: Secondary | ICD-10-CM | POA: Diagnosis not present

## 2015-03-26 DIAGNOSIS — Z833 Family history of diabetes mellitus: Secondary | ICD-10-CM

## 2015-03-26 DIAGNOSIS — S199XXA Unspecified injury of neck, initial encounter: Secondary | ICD-10-CM | POA: Diagnosis not present

## 2015-03-26 DIAGNOSIS — Z7901 Long term (current) use of anticoagulants: Secondary | ICD-10-CM

## 2015-03-26 DIAGNOSIS — M25571 Pain in right ankle and joints of right foot: Secondary | ICD-10-CM | POA: Diagnosis not present

## 2015-03-26 DIAGNOSIS — I10 Essential (primary) hypertension: Secondary | ICD-10-CM | POA: Diagnosis present

## 2015-03-26 DIAGNOSIS — S99921A Unspecified injury of right foot, initial encounter: Secondary | ICD-10-CM | POA: Diagnosis not present

## 2015-03-26 DIAGNOSIS — Z8249 Family history of ischemic heart disease and other diseases of the circulatory system: Secondary | ICD-10-CM

## 2015-03-26 DIAGNOSIS — Z955 Presence of coronary angioplasty implant and graft: Secondary | ICD-10-CM | POA: Diagnosis not present

## 2015-03-26 DIAGNOSIS — Z823 Family history of stroke: Secondary | ICD-10-CM

## 2015-03-26 DIAGNOSIS — S60511A Abrasion of right hand, initial encounter: Secondary | ICD-10-CM | POA: Diagnosis not present

## 2015-03-26 DIAGNOSIS — R509 Fever, unspecified: Secondary | ICD-10-CM | POA: Diagnosis not present

## 2015-03-26 DIAGNOSIS — M79671 Pain in right foot: Secondary | ICD-10-CM | POA: Diagnosis not present

## 2015-03-26 DIAGNOSIS — I4891 Unspecified atrial fibrillation: Secondary | ICD-10-CM | POA: Diagnosis present

## 2015-03-26 DIAGNOSIS — I420 Dilated cardiomyopathy: Secondary | ICD-10-CM | POA: Diagnosis not present

## 2015-03-26 DIAGNOSIS — S0081XA Abrasion of other part of head, initial encounter: Secondary | ICD-10-CM | POA: Diagnosis present

## 2015-03-26 DIAGNOSIS — E785 Hyperlipidemia, unspecified: Secondary | ICD-10-CM | POA: Diagnosis not present

## 2015-03-26 DIAGNOSIS — W19XXXA Unspecified fall, initial encounter: Secondary | ICD-10-CM | POA: Diagnosis not present

## 2015-03-26 DIAGNOSIS — J189 Pneumonia, unspecified organism: Principal | ICD-10-CM | POA: Diagnosis present

## 2015-03-26 DIAGNOSIS — Y92007 Garden or yard of unspecified non-institutional (private) residence as the place of occurrence of the external cause: Secondary | ICD-10-CM

## 2015-03-26 DIAGNOSIS — Y93H2 Activity, gardening and landscaping: Secondary | ICD-10-CM

## 2015-03-26 DIAGNOSIS — S0990XA Unspecified injury of head, initial encounter: Secondary | ICD-10-CM | POA: Diagnosis not present

## 2015-03-26 DIAGNOSIS — R52 Pain, unspecified: Secondary | ICD-10-CM | POA: Diagnosis not present

## 2015-03-26 DIAGNOSIS — E039 Hypothyroidism, unspecified: Secondary | ICD-10-CM | POA: Diagnosis not present

## 2015-03-26 DIAGNOSIS — W1830XA Fall on same level, unspecified, initial encounter: Secondary | ICD-10-CM | POA: Diagnosis present

## 2015-03-26 DIAGNOSIS — N19 Unspecified kidney failure: Secondary | ICD-10-CM

## 2015-03-26 DIAGNOSIS — E860 Dehydration: Secondary | ICD-10-CM | POA: Diagnosis not present

## 2015-03-26 DIAGNOSIS — Y92009 Unspecified place in unspecified non-institutional (private) residence as the place of occurrence of the external cause: Secondary | ICD-10-CM

## 2015-03-26 DIAGNOSIS — I2510 Atherosclerotic heart disease of native coronary artery without angina pectoris: Secondary | ICD-10-CM | POA: Diagnosis not present

## 2015-03-26 DIAGNOSIS — Y92099 Unspecified place in other non-institutional residence as the place of occurrence of the external cause: Secondary | ICD-10-CM | POA: Diagnosis not present

## 2015-03-26 DIAGNOSIS — W19XXXD Unspecified fall, subsequent encounter: Secondary | ICD-10-CM | POA: Diagnosis not present

## 2015-03-26 DIAGNOSIS — I1310 Hypertensive heart and chronic kidney disease without heart failure, with stage 1 through stage 4 chronic kidney disease, or unspecified chronic kidney disease: Secondary | ICD-10-CM | POA: Diagnosis present

## 2015-03-26 DIAGNOSIS — M7989 Other specified soft tissue disorders: Secondary | ICD-10-CM | POA: Diagnosis not present

## 2015-03-26 HISTORY — DX: Chronic obstructive pulmonary disease, unspecified: J44.9

## 2015-03-26 LAB — CBC WITH DIFFERENTIAL/PLATELET
Basophils Absolute: 0 10*3/uL (ref 0.0–0.1)
Basophils Relative: 0 % (ref 0–1)
Eosinophils Absolute: 0 10*3/uL (ref 0.0–0.7)
Eosinophils Relative: 0 % (ref 0–5)
HCT: 38.1 % — ABNORMAL LOW (ref 39.0–52.0)
HEMOGLOBIN: 12.6 g/dL — AB (ref 13.0–17.0)
LYMPHS ABS: 1.1 10*3/uL (ref 0.7–4.0)
Lymphocytes Relative: 9 % — ABNORMAL LOW (ref 12–46)
MCH: 27.9 pg (ref 26.0–34.0)
MCHC: 33.1 g/dL (ref 30.0–36.0)
MCV: 84.3 fL (ref 78.0–100.0)
MONOS PCT: 4 % (ref 3–12)
Monocytes Absolute: 0.4 10*3/uL (ref 0.1–1.0)
Neutro Abs: 9.8 10*3/uL — ABNORMAL HIGH (ref 1.7–7.7)
Neutrophils Relative %: 87 % — ABNORMAL HIGH (ref 43–77)
Platelets: 145 10*3/uL — ABNORMAL LOW (ref 150–400)
RBC: 4.52 MIL/uL (ref 4.22–5.81)
RDW: 14.8 % (ref 11.5–15.5)
WBC: 11.3 10*3/uL — ABNORMAL HIGH (ref 4.0–10.5)

## 2015-03-26 LAB — COMPREHENSIVE METABOLIC PANEL
ALT: 29 U/L (ref 17–63)
AST: 27 U/L (ref 15–41)
Albumin: 3.2 g/dL — ABNORMAL LOW (ref 3.5–5.0)
Alkaline Phosphatase: 33 U/L — ABNORMAL LOW (ref 38–126)
Anion gap: 10 (ref 5–15)
BUN: 37 mg/dL — ABNORMAL HIGH (ref 6–20)
CO2: 21 mmol/L — AB (ref 22–32)
CREATININE: 1.6 mg/dL — AB (ref 0.61–1.24)
Calcium: 7.9 mg/dL — ABNORMAL LOW (ref 8.9–10.3)
Chloride: 102 mmol/L (ref 101–111)
GFR calc Af Amer: 43 mL/min — ABNORMAL LOW (ref >60)
GFR calc non Af Amer: 37 mL/min — ABNORMAL LOW (ref >60)
Glucose, Bld: 166 mg/dL — ABNORMAL HIGH (ref 65–99)
POTASSIUM: 3.6 mmol/L (ref 3.5–5.1)
SODIUM: 133 mmol/L — AB (ref 135–145)
Total Bilirubin: 1 mg/dL (ref 0.3–1.2)
Total Protein: 6.1 g/dL — ABNORMAL LOW (ref 6.5–8.1)

## 2015-03-26 LAB — URINALYSIS, ROUTINE W REFLEX MICROSCOPIC
Bilirubin Urine: NEGATIVE
Glucose, UA: NEGATIVE mg/dL
KETONES UR: NEGATIVE mg/dL
LEUKOCYTES UA: NEGATIVE
Nitrite: NEGATIVE
PH: 7 (ref 5.0–8.0)
Specific Gravity, Urine: 1.015 (ref 1.005–1.030)
Urobilinogen, UA: 0.2 mg/dL (ref 0.0–1.0)

## 2015-03-26 LAB — PROTIME-INR
INR: 2.25 — AB (ref 0.00–1.49)
Prothrombin Time: 24.7 seconds — ABNORMAL HIGH (ref 11.6–15.2)

## 2015-03-26 LAB — URINE MICROSCOPIC-ADD ON

## 2015-03-26 LAB — TSH: TSH: 0.382 u[IU]/mL (ref 0.350–4.500)

## 2015-03-26 LAB — I-STAT CG4 LACTIC ACID, ED: Lactic Acid, Venous: 0.86 mmol/L (ref 0.5–2.0)

## 2015-03-26 MED ORDER — WARFARIN - PHARMACIST DOSING INPATIENT: Freq: Every day | Status: DC

## 2015-03-26 MED ORDER — OMEGA-3-ACID ETHYL ESTERS 1 G PO CAPS
2.0000 g | ORAL_CAPSULE | Freq: Every day | ORAL | Status: DC
Start: 2015-03-27 — End: 2015-03-28
  Administered 2015-03-27 – 2015-03-28 (×2): 2 g via ORAL
  Filled 2015-03-26 (×2): qty 2

## 2015-03-26 MED ORDER — WARFARIN SODIUM 5 MG PO TABS: 5.0000 mg | ORAL_TABLET | Freq: Once | ORAL | Status: DC

## 2015-03-26 MED ORDER — CALCIUM CARBONATE 1250 (500 CA) MG PO TABS
1250.0000 mg | ORAL_TABLET | Freq: Every day | ORAL | Status: DC
  Administered 2015-03-27 – 2015-03-28 (×2): 1250 mg via ORAL
  Filled 2015-03-26 (×2): qty 3

## 2015-03-26 MED ORDER — ACETAMINOPHEN 325 MG PO TABS
650.0000 mg | ORAL_TABLET | Freq: Four times a day (QID) | ORAL | Status: DC | PRN
  Administered 2015-03-27: 650 mg via ORAL
  Filled 2015-03-26: qty 2

## 2015-03-26 MED ORDER — ACETAMINOPHEN 325 MG PO TABS
650.0000 mg | ORAL_TABLET | Freq: Once | ORAL | Status: AC
  Administered 2015-03-26: 650 mg via ORAL
  Filled 2015-03-26: qty 2

## 2015-03-26 MED ORDER — SODIUM CHLORIDE 0.9 % IV BOLUS (SEPSIS)
1000.0000 mL | Freq: Once | INTRAVENOUS | Status: AC
  Administered 2015-03-26: 1000 mL via INTRAVENOUS

## 2015-03-26 MED ORDER — TAMSULOSIN HCL 0.4 MG PO CAPS
0.4000 mg | ORAL_CAPSULE | Freq: Every day | ORAL | Status: DC
  Administered 2015-03-27 – 2015-03-28 (×2): 0.4 mg via ORAL
  Filled 2015-03-26 (×2): qty 1

## 2015-03-26 MED ORDER — ACETAMINOPHEN 650 MG RE SUPP: 650.0000 mg | Freq: Four times a day (QID) | RECTAL | Status: DC | PRN

## 2015-03-26 MED ORDER — CEFTRIAXONE SODIUM IN DEXTROSE 20 MG/ML IV SOLN
1.0000 g | INTRAVENOUS | Status: DC
  Filled 2015-03-26: qty 50

## 2015-03-26 MED ORDER — ACETAMINOPHEN 325 MG PO TABS: 650.0000 mg | ORAL_TABLET | Freq: Once | ORAL | Status: DC

## 2015-03-26 MED ORDER — HYDROCODONE-ACETAMINOPHEN 5-325 MG PO TABS: 1.0000 | ORAL_TABLET | ORAL | Status: DC | PRN

## 2015-03-26 MED ORDER — GABAPENTIN 300 MG PO CAPS
600.0000 mg | ORAL_CAPSULE | Freq: Three times a day (TID) | ORAL | Status: DC
  Administered 2015-03-26 – 2015-03-28 (×5): 600 mg via ORAL
  Filled 2015-03-26 (×5): qty 2

## 2015-03-26 MED ORDER — SODIUM CHLORIDE 0.9 % IJ SOLN
3.0000 mL | Freq: Two times a day (BID) | INTRAMUSCULAR | Status: DC
  Administered 2015-03-26 – 2015-03-28 (×4): 3 mL via INTRAVENOUS

## 2015-03-26 MED ORDER — ONDANSETRON HCL 4 MG PO TABS: 4.0000 mg | ORAL_TABLET | Freq: Four times a day (QID) | ORAL | Status: DC | PRN

## 2015-03-26 MED ORDER — DEXTROSE 5 % IV SOLN
500.0000 mg | INTRAVENOUS | Status: DC
  Administered 2015-03-26 – 2015-03-27 (×2): 500 mg via INTRAVENOUS
  Filled 2015-03-26 (×2): qty 500

## 2015-03-26 MED ORDER — CARVEDILOL 12.5 MG PO TABS
12.5000 mg | ORAL_TABLET | Freq: Two times a day (BID) | ORAL | Status: DC
  Administered 2015-03-27 – 2015-03-28 (×3): 12.5 mg via ORAL
  Filled 2015-03-26 (×3): qty 1

## 2015-03-26 MED ORDER — PANTOPRAZOLE SODIUM 40 MG PO TBEC
40.0000 mg | DELAYED_RELEASE_TABLET | Freq: Every day | ORAL | Status: DC
  Administered 2015-03-27 – 2015-03-28 (×2): 40 mg via ORAL
  Filled 2015-03-26 (×2): qty 1

## 2015-03-26 MED ORDER — FENOFIBRATE 160 MG PO TABS
160.0000 mg | ORAL_TABLET | Freq: Every day | ORAL | Status: DC
  Administered 2015-03-27 – 2015-03-28 (×2): 160 mg via ORAL
  Filled 2015-03-26 (×2): qty 1

## 2015-03-26 MED ORDER — DEXTROSE 5 % IV SOLN
1.0000 g | Freq: Once | INTRAVENOUS | Status: AC
  Administered 2015-03-26: 1 g via INTRAVENOUS
  Filled 2015-03-26: qty 10

## 2015-03-26 MED ORDER — LEVOTHYROXINE SODIUM 75 MCG PO TABS
150.0000 ug | ORAL_TABLET | Freq: Every day | ORAL | Status: DC
  Administered 2015-03-27 – 2015-03-28 (×2): 150 ug via ORAL
  Filled 2015-03-26 (×2): qty 2

## 2015-03-26 MED ORDER — ONDANSETRON HCL 4 MG/2ML IJ SOLN: 4.0000 mg | Freq: Four times a day (QID) | INTRAMUSCULAR | Status: DC | PRN

## 2015-03-26 MED ORDER — TETANUS-DIPHTH-ACELL PERTUSSIS 5-2.5-18.5 LF-MCG/0.5 IM SUSP
0.5000 mL | Freq: Once | INTRAMUSCULAR | Status: AC
  Administered 2015-03-26: 0.5 mL via INTRAMUSCULAR
  Filled 2015-03-26: qty 0.5

## 2015-03-26 MED ORDER — DEXTROSE 5 % IV SOLN: 500.0000 mg | INTRAVENOUS | Status: DC

## 2015-03-26 NOTE — H&P (Signed)
[facility] History and Physical  [name] XQJ:[id number] DOB: 1926/06/06    PCP:   Claretta Fraise, MD   Chief Complaint: fall, confusion, and fever.   HPI: [name] is an 81 y.o. male with hx known CAD, s/p cardiac stent many years ago, hx of afib on Coumadin, HTN, HLD, COPD, lives alone with close family living nearby, active, still mowing grass himself, brought to the ER as he was confused, having fever with T of 105,  lightheaded, and fell upon standing up.  He was evaluated in the ER, found to have no fever, mild leukocytosis, and with elevated BUN./ Cr to 37 and 1.6.  He also has a CXR with possible early developing PNA, head CT with no acute changes, with mucoid cyst unchanged and no hydrocephalus, cervical CT with no Fx, and foot xay showed possible gouty arthritis.  He was given IV Zithromax and IV Recephin, and hospitalist was asked to admit him for CAP, dehydration, and confusion.  Rewiew of Systems:  Constitutional: Negative for malaise, fever and chills. No significant weight loss or weight gain Eyes: Negative for eye pain, redness and discharge, diplopia, visual changes, or flashes of light. ENMT: Negative for ear pain, hoarseness, nasal congestion, sinus pressure and sore throat. No headaches; tinnitus, drooling, or problem swallowing. Cardiovascular: Negative for chest pain, palpitations, diaphoresis, dyspnea and peripheral edema. ; No orthopnea, PND Respiratory: Negative for cough, hemoptysis, wheezing and stridor. No pleuritic chestpain. Gastrointestinal: Negative for nausea, vomiting, diarrhea, constipation, abdominal pain, melena, blood in stool, hematemesis, jaundice and rectal bleeding.    Genitourinary: Negative for frequency, dysuria, incontinence,flank pain and hematuria; Musculoskeletal: Negative for back pain and neck pain. Negative for swelling and trauma.;  Skin: . Negative for pruritus, rash, abrasions, bruising and skin lesion.; ulcerations Neuro:  Negative for headache, lightheadedness and neck stiffness. Negative for weakness, altered level of consciousness , altered mental status, extremity weakness, burning feet, involuntary movement, seizure and syncope.   Psych: negative for anxiety, depression, insomnia, tearfulness, panic attacks, hallucinations, paranoia, suicidal or homicidal ideation   Past Medical History  Diagnosis Date  . Dilated cardiomyopathy     Felt to be secondary to atrial fibrillation. EF is about 40%  . Atrial fibrillation     Persistent, treated with amiodarone and cardioversion  . Drug therapy     Chronic Coumadin therapy  . CAD (coronary artery disease)     Last catheterization in August 2008 demonstrated the LAD 40% stenosis  . Hypertension   . Dyslipidemia   . Hypothyroidism     Possibly related to amiodarone  . COPD (chronic obstructive pulmonary disease)     Past Surgical History  Procedure Laterality Date  . Hip surgery Left     8yo  . Cardiac catheterization  05/2007    LAD 40% stenosis. Stent in the LAD had 20% in-stent restenosis. Followed by 40-50% distal stenosis. 50% diagonal stenosis. 40-50% circumflex stenosis. 70% OM1 stenosis and 30% ostial RCA stenosis. 30% proximal RCA stenosis.  . Coronary stent placement      Medications:  HOME MEDS: Prior to Admission medications   Medication Sig Start Date End Date Taking? Authorizing Provider  amLODipine (NORVASC) 10 MG tablet TAKE 1 TABLET ONCE A DAY 11/08/14  Yes Minus Breeding, MD  Calcium Carb-Cholecalciferol (CALCIUM 500 +D PO) Take 1 tablet by mouth daily.    Yes Historical Provider, MD  CALCIUM PO Take 1 tablet by mouth daily.   Yes Historical Provider, MD  carvedilol (COREG) 12.5 MG tablet  TAKE (1) TABLET TWICE A DAY. 03/21/15  Yes Claretta Fraise, MD  fenofibrate micronized (LOFIBRA) 134 MG capsule Take 1 capsule (134 mg total) by mouth daily before breakfast. 01/13/15  Yes Tammy Eckard, PHARMD  fish oil-omega-3 fatty acids 1000 MG capsule  Take 2 g by mouth daily.   Yes Historical Provider, MD  gabapentin (NEURONTIN) 600 MG tablet Take 1 tablet (600 mg total) by mouth 3 (three) times daily. 03/06/15  Yes Claretta Fraise, MD  hydrochlorothiazide (HYDRODIURIL) 25 MG tablet TAKE 1 TABLET ONCE A DAY 12/07/14  Yes Chipper Herb, MD  ibuprofen (ADVIL,MOTRIN) 200 MG tablet Take 400 mg by mouth every 6 (six) hours as needed for moderate pain.   Yes Historical Provider, MD  levothyroxine (SYNTHROID, LEVOTHROID) 150 MCG tablet TAKE 1 TABLET DAILY 01/19/15  Yes Claretta Fraise, MD  lisinopril (PRINIVIL,ZESTRIL) 40 MG tablet TAKE 1 TABLET ONCE A DAY FOR HIGH BLOOD PRESSURE 11/30/14  Yes Chipper Herb, MD  nitroGLYCERIN (NITROSTAT) 0.4 MG SL tablet Place 1 tablet (0.4 mg total) under the tongue every 5 (five) minutes as needed for chest pain. 08/15/14  Yes Lysbeth Penner, FNP  tamsulosin (FLOMAX) 0.4 MG CAPS capsule Take 1 capsule (0.4 mg total) by mouth daily. 12/07/14  Yes Chipper Herb, MD  warfarin (COUMADIN) 5 MG tablet Take 5-7.5 mg by mouth daily. Take 5 mg once daily except on Monday and Friday take 7.5 mg.   Yes Historical Provider, MD  zolpidem (AMBIEN) 5 MG tablet TAKE ONE TABLET AT BEDTIME 02/13/15  Yes Claretta Fraise, MD  zolpidem (AMBIEN) 5 MG tablet Take 5 mg by mouth at bedtime as needed for sleep.   Yes Historical Provider, MD     Allergies:  Allergies  Allergen Reactions  . Penicillins Itching    Social History:   reports that he has never smoked. He has never used smokeless tobacco. He reports that he does not drink alcohol or use illicit drugs.  Family History: Family History  Problem Relation Age of Onset  . Adopted: Yes  . Heart attack Mother   . Heart attack Father   . Diabetes Other   . Stroke Other      Physical Exam: Filed Vitals:   03/26/15 1351 03/26/15 1425 03/26/15 1456 03/26/15 1501  BP:  124/47  123/48  Pulse:  73  72  Temp: 100.4 F (38 C)  97.9 F (36.6 C)   TempSrc: Rectal  Oral   Resp:  24  20   Height:      Weight:      SpO2:  100%  100%   Blood pressure 123/48, pulse 72, temperature 97.9 F (36.6 C), temperature source Oral, resp. rate 20, height 5\' 5"  (1.651 m), weight 84.823 kg (187 lb), SpO2 100 %.  GEN:  Pleasant  patient lying in the stretcher in no acute distress; cooperative with exam. PSYCH:  alert and oriented x4; does not appear anxious or depressed; affect is appropriate. HEENT: Mucous membranes pink and anicteric; PERRLA; EOM intact; no cervical lymphadenopathy nor thyromegaly or carotid bruit; no JVD; There were no stridor. Neck is very supple. Breasts:: Not examined CHEST WALL: No tenderness CHEST: Normal respiration, no wheezing but scattered rhochi. HEART: Irregular rate and rhythm.  There are no murmur, rub, or gallops.   BACK: No kyphosis or scoliosis; no CVA tenderness ABDOMEN: soft and non-tender; no masses, no organomegaly, normal abdominal bowel sounds; no pannus; no intertriginous candida. There is no rebound and no distention. Rectal  Exam: Not done EXTREMITIES: No bone or joint deformity; age-appropriate arthropathy of the hands and knees; no edema; no ulcerations.  There is no calf tenderness. Genitalia: not examined PULSES: 2+ and symmetric SKIN: Normal hydration no rash or ulceration. Abrasion on right temple area.  Hematoma on both arms.  CNS: Cranial nerves 2-12 grossly intact no focal lateralizing neurologic deficit.  Speech is fluent; uvula elevated with phonation, facial symmetry and tongue midline. DTR are normal bilaterally, cerebella exam is intact, barbinski is negative and strengths are equaled bilaterally.  No sensory loss.   Labs on Admission:  Basic Metabolic Panel:  Recent Labs Lab 03/26/15 1149  NA 133*  K 3.6  CL 102  CO2 21*  GLUCOSE 166*  BUN 37*  CREATININE 1.60*  CALCIUM 7.9*   Liver Function Tests:  Recent Labs Lab 03/26/15 1149  AST 27  ALT 29  ALKPHOS 33*  BILITOT 1.0  PROT 6.1*  ALBUMIN 3.2*   No results  for input(s): LIPASE, AMYLASE in the last 168 hours. No results for input(s): AMMONIA in the last 168 hours. CBC:  Recent Labs Lab 03/26/15 1149  WBC 11.3*  NEUTROABS 9.8*  HGB 12.6*  HCT 38.1*  MCV 84.3  PLT 145*   Radiological Exams on Admission: Ct Head Wo Contrast  03/26/2015   CLINICAL DATA:  Fever.  Fall while watering the garden.  EXAM: CT HEAD WITHOUT CONTRAST  CT CERVICAL SPINE WITHOUT CONTRAST  TECHNIQUE: Multidetector CT imaging of the head and cervical spine was performed following the standard protocol without intravenous contrast. Multiplanar CT image reconstructions of the cervical spine were also generated.  COMPARISON:  10/05/2013  FINDINGS: CT HEAD FINDINGS  The brain shows generalized atrophy. There chronic small-vessel ischemic changes affecting the brainstem. No focal cerebellar abnormality. The cerebral hemispheres do not show evidence of an old or acute stroke. 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 No evidence of obstructive hydrocephalus. The calvarium is unremarkable. Sinuses, middle ears and mastoids are clear.  CT CERVICAL SPINE FINDINGS  Alignment is normal. No fracture. No soft tissue swelling. Ordinary degenerative spondylosis at C3-4 and C4-5 an ordinary facet degeneration at C4-5, C5-6 and C6-7.  IMPRESSION: Head CT: No acute finding. Age related atrophy. 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  Cervical spine CT: No acute or traumatic finding. Ordinary chronic degenerative changes.   Electronically Signed   By: Nelson Chimes M.D.   On: 03/26/2015 13:55   Ct Cervical Spine Wo Contrast  03/26/2015   CLINICAL DATA:  Fever.  Fall while watering the garden.  EXAM: CT HEAD WITHOUT CONTRAST  CT CERVICAL SPINE WITHOUT CONTRAST  TECHNIQUE: Multidetector CT imaging of the head and cervical spine was performed following the standard protocol without intravenous contrast. Multiplanar CT  image reconstructions of the cervical spine were also generated.  COMPARISON:  10/05/2013  FINDINGS: CT HEAD FINDINGS  The brain shows generalized atrophy. There chronic small-vessel ischemic changes affecting the brainstem. No focal cerebellar abnormality. The cerebral hemispheres do not show evidence of an old or acute stroke. 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 No evidence of obstructive hydrocephalus. The calvarium is unremarkable. Sinuses, middle ears and mastoids are clear.  CT CERVICAL SPINE FINDINGS  Alignment is normal. No fracture. No soft tissue swelling. Ordinary degenerative spondylosis at C3-4 and C4-5 an ordinary facet degeneration at C4-5, C5-6 and C6-7.  IMPRESSION: Head CT: No acute  finding. Age related atrophy. 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  Cervical spine CT: No acute or traumatic finding. Ordinary chronic degenerative changes.   Electronically Signed   By: Nelson Chimes M.D.   On: 03/26/2015 13:55   Dg Chest Port 1 View  03/26/2015   CLINICAL DATA:  Fever for 2 days. Fall. Atrial fibrillation. Coronary artery disease. COPD.  EXAM: PORTABLE CHEST - 1 VIEW  COMPARISON:  01/27/2014  FINDINGS: Two frontal views of the chest. Minimal patient rotation to the right. Hyperinflation. Midline trachea. Mild cardiomegaly with transverse aortic atherosclerosis. No pleural effusion or pneumothorax. Diffuse peribronchial thickening. Patchy left base airspace disease.  IMPRESSION: Hyperinflation with patchy left base airspace disease. Although this could represent atelectasis or progressive scar, given the clinical history, suspicious for early pneumonia.  Cardiomegaly and atherosclerosis.   Electronically Signed   By: Abigail Miyamoto M.D.   On: 03/26/2015 12:59    EKG: Independently reviewed.    Assessment/Plan Present on Admission:  . CAP (community acquired pneumonia) . Essential hypertension .  Coronary atherosclerosis  PLAN:  WIll admit him of CAP.  Will continue with IV Rocephin and Zithromax.  He is doing well generally considered his advanced age.  He denied falling frequently, so will continue with coumadin for his afib.  Risks reminded.  He is also a little volume depleted, and had received IVF in the ER.  Will cut back on some of his BP meds, namingly Norvasc.  Will give him a Td with tetanus toxoid.  His rate is controlled, and we will continue with his cardiac meds.  He is stable, full code, and will be admitted to telemetry under [facility] service.   Other plans as per orders.  Code Status: FULL Haskel Khan, MD. [facility] Pager [phone] 7pm to 7am.  03/26/2015, 3:03 PM

## 2015-03-26 NOTE — ED Notes (Signed)
Abrasion to right side of head and well heal area to top of head.

## 2015-03-26 NOTE — ED Notes (Signed)
cbg 140 per ems

## 2015-03-26 NOTE — Progress Notes (Signed)
ANTICOAGULATION CONSULT NOTE - Initial Consult  Pharmacy Consult for Coumadin Indication: atrial fibrillation  Allergies  Allergen Reactions  . Penicillins Itching    Patient Measurements: Height: 5\' 5"  (165.1 cm) Weight: 187 lb (84.823 kg) IBW/kg (Calculated) : 61.5 Heparin Dosing Weight:   Vital Signs: Temp: 97.6 F (36.4 C) (06/12 1632) Temp Source: Oral (06/12 1632) BP: 130/50 mmHg (06/12 1632) Pulse Rate: 68 (06/12 1632)  Labs:  Recent Labs  03/26/15 1149  HGB 12.6*  HCT 38.1*  PLT 145*  LABPROT 24.7*  INR 2.25*  CREATININE 1.60*    Estimated Creatinine Clearance: 32.6 mL/min (by C-G formula based on Cr of 1.6).   Medical History: Past Medical History  Diagnosis Date  . Dilated cardiomyopathy     Felt to be secondary to atrial fibrillation. EF is about 40%  . Atrial fibrillation     Persistent, treated with amiodarone and cardioversion  . Drug therapy     Chronic Coumadin therapy  . CAD (coronary artery disease)     Last catheterization in August 2008 demonstrated the LAD 40% stenosis  . Hypertension   . Dyslipidemia   . Hypothyroidism     Possibly related to amiodarone  . COPD (chronic obstructive pulmonary disease)     Medications:  Scheduled:    Assessment: Continuation of PTA Coumadin for AFIB PTA medication reviewed Labs reviewed INR therapeutic on admission  Goal of Therapy:  INR 2-3 Monitor platelets by anticoagulation protocol: Yes   Plan:  Coumadin 5 mg po x 1 dose today (home regiment) INR/PT daily Labs per protocol   Abner Greenspan, Denece Shearer Bennett 03/26/2015,5:22 PM

## 2015-03-26 NOTE — ED Notes (Signed)
Not able to obtain lab for i-stat.

## 2015-03-26 NOTE — ED Notes (Signed)
[place] in garden.  Denies dizziness.  [place]/o pain to right ankle.  Says he hurt right ankle about two days ago and not sure how he hurt it.  Has multiple bruises over entire body and skin tear to right wrist.

## 2015-03-26 NOTE — ED Notes (Signed)
Hospitalist at bedside 

## 2015-03-26 NOTE — ED Provider Notes (Signed)
CSN: [id number]     Arrival date & time 03/26/15  1119 History  This chart was scribed for Quintella Reichert, MD by Starleen Arms, ED Scribe. This patient was seen in room APA02/APA02 and the patient's care was started at 11:55 PM.   Chief Complaint  Patient presents with  . Fever  . Fall   The history is provided by the patient. No language interpreter was used.   HPI Comments: Efe Fazzino is a 81 y.o. male brought in by ambulance, who presents to the Emergency Department complaining of a fall sustained PTA with associated fever TMAX 105 yesterday and 103.3 in ED today.  The patient reports he was in his garden watering when he fell with unknown mechanism.  The patients daughter called EMS.  Yesterday, he was treated for heat exhaustion by EMS after some confusion and erratic behavior after attending an out-door concert.  He refused transportation to the ED at that time.  He denies LOC during both instances.  Patient takes warfarin.  Patient does not consume alcohol or illicit drugs.  He denies CP, SOB, cough, abdominal pain, vomiting, diarrhea, dysuria.  Family reports that he has some cough.  Past Medical History  Diagnosis Date  . Dilated cardiomyopathy     Felt to be secondary to atrial fibrillation. EF is about 40%  . Atrial fibrillation     Persistent, treated with amiodarone and cardioversion  . Drug therapy     Chronic Coumadin therapy  . CAD (coronary artery disease)     Last catheterization in August 2008 demonstrated the LAD 40% stenosis  . Hypertension   . Dyslipidemia   . Hypothyroidism     Possibly related to amiodarone  . COPD (chronic obstructive pulmonary disease)    Past Surgical History  Procedure Laterality Date  . Hip surgery Left     8yo  . Cardiac catheterization  05/2007    LAD 40% stenosis. Stent in the LAD had 20% in-stent restenosis. Followed by 40-50% distal stenosis. 50% diagonal stenosis. 40-50% circumflex stenosis. 70% OM1 stenosis and 30% ostial RCA  stenosis. 30% proximal RCA stenosis.  . Coronary stent placement     Family History  Problem Relation Age of Onset  . Adopted: Yes  . Heart attack Mother   . Heart attack Father   . Diabetes Other   . Stroke Other    History  Substance Use Topics  . Smoking status: Never Smoker   . Smokeless tobacco: Never Used  . Alcohol Use: No     Comment: quit at age 73yo    Review of Systems  Constitutional: Positive for fever.  Respiratory: Negative for cough and shortness of breath.   Cardiovascular: Negative for chest pain.  Gastrointestinal: Negative for vomiting, abdominal pain and diarrhea.  Genitourinary: Negative for dysuria.  Skin: Positive for color change and wound.  All other systems reviewed and are negative.     Allergies  Penicillins  Home Medications   Prior to Admission medications   Medication Sig Start Date End Date Taking? Authorizing Provider  amLODipine (NORVASC) 10 MG tablet TAKE 1 TABLET ONCE A DAY 11/08/14   Minus Breeding, MD  Calcium Carb-Cholecalciferol (CALCIUM 500 +D PO) Take 1 tablet by mouth daily.     Historical Provider, MD  CALCIUM PO Take 1 tablet by mouth daily.    Historical Provider, MD  carvedilol (COREG) 12.5 MG tablet TAKE (1) TABLET TWICE A DAY. 03/21/15   Claretta Fraise, MD  fenofibrate micronized (LOFIBRA) 134  MG capsule Take 1 capsule (134 mg total) by mouth daily before breakfast. 01/13/15   Tammy Eckard, PHARMD  fish oil-omega-3 fatty acids 1000 MG capsule Take 2 g by mouth daily.    Historical Provider, MD  gabapentin (NEURONTIN) 600 MG tablet Take 1 tablet (600 mg total) by mouth 3 (three) times daily. 03/06/15   Claretta Fraise, MD  hydrochlorothiazide (HYDRODIURIL) 25 MG tablet TAKE 1 TABLET ONCE A DAY 12/07/14   Chipper Herb, MD  HYDROcodone-acetaminophen [facility]) 5-325 MG per tablet One half to one po q 6 hours prn pain 09/15/14   Lysbeth Penner, FNP  ibuprofen (ADVIL,MOTRIN) 200 MG tablet Take 400 mg by mouth every 6 (six) hours as  needed for moderate pain.    Historical Provider, MD  levothyroxine (SYNTHROID, LEVOTHROID) 150 MCG tablet TAKE 1 TABLET DAILY 01/19/15   Claretta Fraise, MD  lisinopril (PRINIVIL,ZESTRIL) 40 MG tablet TAKE 1 TABLET ONCE A DAY FOR HIGH BLOOD PRESSURE 11/30/14   Chipper Herb, MD  naproxen (NAPROSYN) 500 MG tablet Take 1 tablet (500 mg total) by mouth 2 (two) times daily. 02/13/15   Nat Christen, MD  nitroGLYCERIN (NITROSTAT) 0.4 MG SL tablet Place 1 tablet (0.4 mg total) under the tongue every 5 (five) minutes as needed for chest pain. 08/15/14   Lysbeth Penner, FNP  predniSONE (DELTASONE) 10 MG tablet Take 5 daily for 3 days followed by 4,3,2 and 1 for 3 days each. 03/06/15   Claretta Fraise, MD  tamsulosin (FLOMAX) 0.4 MG CAPS capsule Take 1 capsule (0.4 mg total) by mouth daily. 12/07/14   Chipper Herb, MD  warfarin (COUMADIN) 5 MG tablet Take 5-7.5 mg by mouth daily. Take 5 mg once daily except on Monday and Friday take 7.5 mg.    Historical Provider, MD  warfarin (COUMADIN) 5 MG tablet TAKE 1 TABLET DAILY OR AS DIRECTED 02/28/15   Claretta Fraise, MD  zolpidem (AMBIEN) 5 MG tablet TAKE ONE TABLET AT BEDTIME 02/13/15   Claretta Fraise, MD   There were no vitals taken for this visit. Physical Exam  Constitutional: He is oriented to person, place, and time. He appears well-developed and well-nourished.  HENT:  Abrasion to right forehead and temporal region  Eyes: EOM are normal. Pupils are equal, round, and reactive to light.  Cardiovascular: Normal rate.   No murmur heard. Pulmonary/Chest:  Ecchymosis over upper chest wall.  Decreased air movement in bilateral lung bases.   Abdominal: Soft. There is no tenderness. There is no rebound and no guarding.  Musculoskeletal:  No c, t, or l-spine tenderness.  1+ pitting edema to the right lower extremity, ecchymosis over the right ankle and mild tenderness over the right lateral foot.  Neurological: He is alert and oriented to person, place, and time.  Skin:  Skin is warm and dry.  Abrasion to the right cheek and forehead.  Abrasion of the right dorsal hand without focal tenderness.  Multiple abrasions on the arms and legs in various stages of healing 1+ pitting edema in RLE .    Psychiatric: He has a normal mood and affect. His behavior is normal.  Nursing note and vitals reviewed.   ED Course  Procedures (including critical care time)  DIAGNOSTIC STUDIES: Oxygen Saturation is 99% on RA, normal by my interpretation.    COORDINATION OF CARE:  12:05 PM Discussed treatment plan with patient at bedside.  Patient acknowledges and agrees with plan.    Labs Review Labs Reviewed  CBC WITH  DIFFERENTIAL/PLATELET - Abnormal; Notable for the following:    WBC 11.3 (*)    Hemoglobin 12.6 (*)    HCT 38.1 (*)    Platelets 145 (*)    Neutrophils Relative % 87 (*)    Neutro Abs 9.8 (*)    Lymphocytes Relative 9 (*)    All other components within normal limits  URINALYSIS, ROUTINE W REFLEX MICROSCOPIC (NOT AT [facility]) - Abnormal; Notable for the following:    Hgb urine dipstick SMALL (*)    Protein, ur TRACE (*)    All other components within normal limits  COMPREHENSIVE METABOLIC PANEL - Abnormal; Notable for the following:    Sodium 133 (*)    CO2 21 (*)    Glucose, Bld 166 (*)    BUN 37 (*)    Creatinine, Ser 1.60 (*)    Calcium 7.9 (*)    Total Protein 6.1 (*)    Albumin 3.2 (*)    Alkaline Phosphatase 33 (*)    GFR calc non Af Amer 37 (*)    GFR calc Af Amer 43 (*)    All other components within normal limits  URINE MICROSCOPIC-ADD ON - Abnormal; Notable for the following:    Bacteria, UA FEW (*)    All other components within normal limits  PROTIME-INR - Abnormal; Notable for the following:    Prothrombin Time 24.7 (*)    INR 2.25 (*)    All other components within normal limits  PROTIME-INR - Abnormal; Notable for the following:    Prothrombin Time 21.0 (*)    INR 1.82 (*)    All other components within normal limits  CULTURE,  BLOOD (ROUTINE X 2)  CULTURE, BLOOD (ROUTINE X 2)  URINE CULTURE  TSH  I-STAT CG4 LACTIC ACID, ED  I-STAT CG4 LACTIC ACID, ED    Imaging Review Ct Head Wo Contrast  03/26/2015   CLINICAL DATA:  Fever.  Fall while watering the garden.  EXAM: CT HEAD WITHOUT CONTRAST  CT CERVICAL SPINE WITHOUT CONTRAST  TECHNIQUE: Multidetector CT imaging of the head and cervical spine was performed following the standard protocol without intravenous contrast. Multiplanar CT image reconstructions of the cervical spine were also generated.  COMPARISON:  10/05/2013  FINDINGS: CT HEAD FINDINGS  The brain shows generalized atrophy. There chronic small-vessel ischemic changes affecting the brainstem. No focal cerebellar abnormality. The cerebral hemispheres do not show evidence of an old or acute stroke. 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 No evidence of obstructive hydrocephalus. The calvarium is unremarkable. Sinuses, middle ears and mastoids are clear.  CT CERVICAL SPINE FINDINGS  Alignment is normal. No fracture. No soft tissue swelling. Ordinary degenerative spondylosis at C3-4 and C4-5 an ordinary facet degeneration at C4-5, C5-6 and C6-7.  IMPRESSION: Head CT: No acute finding. Age related atrophy. 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  Cervical spine CT: No acute or traumatic finding. Ordinary chronic degenerative changes.   Electronically Signed   By: Nelson Chimes M.D.   On: 03/26/2015 13:55   Ct Cervical Spine Wo Contrast  03/26/2015   CLINICAL DATA:  Fever.  Fall while watering the garden.  EXAM: CT HEAD WITHOUT CONTRAST  CT CERVICAL SPINE WITHOUT CONTRAST  TECHNIQUE: Multidetector CT imaging of the head and cervical spine was performed following the standard protocol without intravenous contrast. Multiplanar CT image reconstructions of the cervical spine were also generated.  COMPARISON:  10/05/2013  FINDINGS: CT HEAD  FINDINGS  The brain shows generalized atrophy. There chronic small-vessel ischemic changes affecting the brainstem. No focal cerebellar abnormality. The cerebral hemispheres do not show evidence of an old or acute stroke. 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 No evidence of obstructive hydrocephalus. The calvarium is unremarkable. Sinuses, middle ears and mastoids are clear.  CT CERVICAL SPINE FINDINGS  Alignment is normal. No fracture. No soft tissue swelling. Ordinary degenerative spondylosis at C3-4 and C4-5 an ordinary facet degeneration at C4-5, C5-6 and C6-7.  IMPRESSION: Head CT: No acute finding. Age related atrophy. 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  Cervical spine CT: No acute or traumatic finding. Ordinary chronic degenerative changes.   Electronically Signed   By: Nelson Chimes M.D.   On: 03/26/2015 13:55   Dg Chest Port 1 View  03/26/2015   CLINICAL DATA:  Fever for 2 days. Fall. Atrial fibrillation. Coronary artery disease. COPD.  EXAM: PORTABLE CHEST - 1 VIEW  COMPARISON:  01/27/2014  FINDINGS: Two frontal views of the chest. Minimal patient rotation to the right. Hyperinflation. Midline trachea. Mild cardiomegaly with transverse aortic atherosclerosis. No pleural effusion or pneumothorax. Diffuse peribronchial thickening. Patchy left base airspace disease.  IMPRESSION: Hyperinflation with patchy left base airspace disease. Although this could represent atelectasis or progressive scar, given the clinical history, suspicious for early pneumonia.  Cardiomegaly and atherosclerosis.   Electronically Signed   By: Abigail Miyamoto M.D.   On: 03/26/2015 12:59   Dg Foot Complete Right  03/26/2015   CLINICAL DATA:  Right foot pain and swelling. Recent falls. Initial encounter.  EXAM: RIGHT FOOT COMPLETE - 3+ VIEW  COMPARISON:  None.  FINDINGS: The bones are diffusely demineralized. No evidence of acute fracture  or dislocation. There are scattered mild arthropathic changes, greatest in the midfoot and first metatarsal phalangeal joint. There is medial soft tissue prominence at the first MTP joint. There are possible small erosions medially in the first metatarsal head. There appears to be mild dorsal forefoot soft tissue swelling.  IMPRESSION: Osteopenia without acute osseous findings. Arthropathic changes as described with dorsal forefoot soft tissue swelling and potential changes of gout at the first MTP joint.   Electronically Signed   By: Richardean Sale M.D.   On: 03/26/2015 15:05     EKG Interpretation   Date/Time:  Sunday March 26 2015 11:31:45 EDT Ventricular Rate:  90 PR Interval:  241 QRS Duration: 159 QT Interval:  413 QTC Calculation: 505 R Axis:   27 Text Interpretation:  Sinus rhythm Prolonged PR interval Left bundle  branch block Confirmed by Hazle Coca [phone]) on 03/26/2015 11:58:55 AM      MDM   Final diagnoses:  None  Dx: CAP, fall, head contusion  Patient here for evaluation of fever, fall. Patient appears mildly confused, with generalized weakness on exam. Chest x-ray concerning for pneumonia, given confusion yesterday and fever to 105, febrile again today with mild leukocytosis recommend admission for IV antibiotics. Discussed hospitalist regarding admission.  I personally performed the services described in this documentation, which was scribed in my presence. The recorded information has been reviewed and is accurate.    Quintella Reichert, MD 03/27/15 [phone]

## 2015-03-26 NOTE — ED Notes (Addendum)
EMS reports pt had called ems last night for heat injury and was given a liter of IVF but refused transport to ER.  Was in his garden this morning, felt weak, and tripped over a water bucket.  EMS found pt to have fever 103.3 axillary,  r ankle swelling and pain, generalized weakness, skin tears to top of head, r side of face, upper extremities per ems.  EMS also reports pt removed a tick from him several weeks ago and another one yesterday.

## 2015-03-27 DIAGNOSIS — I10 Essential (primary) hypertension: Secondary | ICD-10-CM

## 2015-03-27 DIAGNOSIS — W19XXXD Unspecified fall, subsequent encounter: Secondary | ICD-10-CM

## 2015-03-27 LAB — URINE CULTURE

## 2015-03-27 LAB — PROTIME-INR
INR: 1.82 — ABNORMAL HIGH (ref 0.00–1.49)
Prothrombin Time: 21 seconds — ABNORMAL HIGH (ref 11.6–15.2)

## 2015-03-27 MED ORDER — DEXTROSE 5 % IV SOLN
1.0000 g | INTRAVENOUS | Status: DC
  Administered 2015-03-27 – 2015-03-28 (×2): 1 g via INTRAVENOUS
  Filled 2015-03-27 (×3): qty 10

## 2015-03-27 MED ORDER — WARFARIN SODIUM 5 MG PO TABS
7.5000 mg | ORAL_TABLET | Freq: Once | ORAL | Status: AC
  Administered 2015-03-27: 7.5 mg via ORAL
  Filled 2015-03-27: qty 2

## 2015-03-27 NOTE — Care Management Note (Signed)
Case Management Note  Patient Details  Name: [name] MRN: [id number] Date of Birth: 07/08/1926  Subjective/Objective:                  Pt admitted from home with pneumonia. Pt lives alone and will return home at discharge. Pt has a daughter who lives next door and is very active in the care of the pt. Pt has a cane that he uses but is fairly independent with ADL's.  Action/Plan: No CM needs noted. PT does not recommend any follow up.  Expected Discharge Date:                  Expected Discharge Plan:  Home/Self Care  In-House Referral:  NA  Discharge planning Services  CM Consult  Post Acute Care Choice:  NA Choice offered to:  NA  DME Arranged:    DME Agency:     HH Arranged:    HH Agency:     Status of Service:  Completed, signed off  Medicare Important Message Given:    Date Medicare IM Given:    Medicare IM give by:    Date Additional Medicare IM Given:    Additional Medicare Important Message give by:     If discussed at [place] of Stay Meetings, dates discussed:    Additional Comments:  Joylene Draft, RN 03/27/2015, 12:33 PM

## 2015-03-27 NOTE — Evaluation (Addendum)
Physical Therapy Evaluation Patient Details Name: [name] MRN: [id number] DOB: 08/08/1926 Today's Date: 03/27/2015   History of Present Illness  Pt is an 81 year old male admitted for pneumonia.  He is normally very active at home, mows lawns as a business.  He lives alone with family next door.  He had a sudden onset of confusion and fever.  Clinical Impression   Pt is a delightful gentleman who is now very alert and oriented, reports feeling well and anxious to get home.  On evaluation his strength is found to be WNL as is his balance.  His hips are internally rotated and his stride is shortened, as well as having a mild decrease in stance time left.  I instructed him in gait with a straight cane that this improved his gait rhythm significantly.  He has multiple canes at home and will use on from here on.  He should be able to transfer to home with no difficulty.    Follow Up Recommendations No PT follow up    Equipment Recommendations  None recommended by PT    Recommendations for Other Services   none    Precautions / Restrictions Precautions Precautions: None Restrictions Weight Bearing Restrictions: No      Mobility  Bed Mobility Overal bed mobility: Independent                Transfers Overall transfer level: Independent Equipment used: None                Ambulation/Gait Ambulation/Gait assistance: Modified independent (Device/Increase time) Ambulation Distance (Feet): 150 Feet (150' with no assistive device, 150' with a cane) Assistive device: None;Straight cane Gait Pattern/deviations: Decreased stride length;Decreased stance time - left   Gait velocity interpretation: at or above normal speed for age/gender General Gait Details: pt internally rotates both hips during gait which causes an altered gait pattern.Marland KitchenMarland KitchenI instructed him in gait with a straight cane to assist in gait rhythm and he feels much more secure  Stairs Stairs: Yes Stairs  assistance: Modified independent (Device/Increase time) Stair Management: One rail Right;Alternating pattern;Forwards Number of Stairs: 10             Balance Overall balance assessment: Modified Independent                                           Pertinent Vitals/Pain Pain Assessment: No/denies pain    Home Living Family/patient expects to be discharged to:: Private residence Living Arrangements: Alone Available Help at Discharge: Family;Friend(s);Available PRN/intermittently Type of Home: House Home Access: Stairs to enter Entrance Stairs-Rails: Right Entrance Stairs-Number of Steps: 2 Home Layout: One level Home Equipment: Cane - single point      Prior Function Level of Independence: Independent               Hand Dominance   Dominant Hand: Right    Extremity/Trunk Assessment               Lower Extremity Assessment: Overall WFL for tasks assessed      Cervical / Trunk Assessment: Normal  Communication   Communication: No difficulties  Cognition Arousal/Alertness: Awake/alert Behavior During Therapy: WFL for tasks assessed/performed Overall Cognitive Status: Within Functional Limits for tasks assessed  Assessment/Plan    PT Assessment Patent does not need any further PT services  PT Diagnosis     PT Problem List    PT Treatment Interventions     PT Goals (Current goals can be found in the Care Plan section) Acute Rehab PT Goals PT Goal Formulation: All assessment and education complete, DC therapy         Barriers to discharge  none                     End of Session Equipment Utilized During Treatment: Gait belt Activity Tolerance: Patient tolerated treatment well Patient left: in chair;with call bell/phone within reach;with chair alarm set Nurse Communication: Mobility status         Time: 4540-9811 PT Time Calculation (min) (ACUTE ONLY): 32  min   Charges:   PT Evaluation $Initial PT Evaluation Tier I: 1 Procedure     PT G CodesOwens Shark, Zoha Spranger L  PT 03/27/2015, 11:40 AM  [phone]

## 2015-03-27 NOTE — Progress Notes (Signed)
Patient ambulated in hall tolerated well. 

## 2015-03-27 NOTE — Progress Notes (Signed)
UR chart review completed.  

## 2015-03-27 NOTE — Progress Notes (Signed)
[facility] for Coumadin Indication: atrial fibrillation  Allergies  Allergen Reactions  . Penicillins Itching    Patient Measurements: Height: 5\' 6"  (167.6 cm) Weight: 180 lb 11.2 oz (81.965 kg) IBW/kg (Calculated) : 63.8  Vital Signs: Temp: 99 F (37.2 C) (06/13 0546) Temp Source: Oral (06/13 0546) BP: 164/48 mmHg (06/13 0546) Pulse Rate: 69 (06/13 0546)  Labs:  Recent Labs  03/26/15 1149 03/27/15 0602  HGB 12.6*  --   HCT 38.1*  --   PLT 145*  --   LABPROT 24.7* 21.0*  INR 2.25* 1.82*  CREATININE 1.60*  --     Estimated Creatinine Clearance: 32.7 mL/min (by C-G formula based on Cr of 1.6).   Medical History: Past Medical History  Diagnosis Date  . Dilated cardiomyopathy     Felt to be secondary to atrial fibrillation. EF is about 40%  . Atrial fibrillation     Persistent, treated with amiodarone and cardioversion  . Drug therapy     Chronic Coumadin therapy  . CAD (coronary artery disease)     Last catheterization in August 2008 demonstrated the LAD 40% stenosis  . Hypertension   . Dyslipidemia   . Hypothyroidism     Possibly related to amiodarone  . COPD (chronic obstructive pulmonary disease)     Medications:  Medications Prior to Admission  Medication Sig Dispense Refill  . amLODipine (NORVASC) 10 MG tablet TAKE 1 TABLET ONCE A DAY 30 tablet 5  . Calcium Carb-Cholecalciferol (CALCIUM 500 +D PO) Take 1 tablet by mouth daily.     Marland Kitchen CALCIUM PO Take 1 tablet by mouth daily.    . carvedilol (COREG) 12.5 MG tablet TAKE (1) TABLET TWICE A DAY. 60 tablet 4  . fenofibrate micronized (LOFIBRA) 134 MG capsule Take 1 capsule (134 mg total) by mouth daily before breakfast. 90 capsule 1  . fish oil-omega-3 fatty acids 1000 MG capsule Take 2 g by mouth daily.    Marland Kitchen gabapentin (NEURONTIN) 600 MG tablet Take 1 tablet (600 mg total) by mouth 3 (three) times daily. 60 tablet 5  . hydrochlorothiazide (HYDRODIURIL) 25 MG tablet  TAKE 1 TABLET ONCE A DAY 90 tablet 1  . ibuprofen (ADVIL,MOTRIN) 200 MG tablet Take 400 mg by mouth every 6 (six) hours as needed for moderate pain.    Marland Kitchen levothyroxine (SYNTHROID, LEVOTHROID) 150 MCG tablet TAKE 1 TABLET DAILY 30 tablet 9  . lisinopril (PRINIVIL,ZESTRIL) 40 MG tablet TAKE 1 TABLET ONCE A DAY FOR HIGH BLOOD PRESSURE 30 tablet 3  . nitroGLYCERIN (NITROSTAT) 0.4 MG SL tablet Place 1 tablet (0.4 mg total) under the tongue every 5 (five) minutes as needed for chest pain. 25 tablet 1  . tamsulosin (FLOMAX) 0.4 MG CAPS capsule Take 1 capsule (0.4 mg total) by mouth daily. 30 capsule 5  . warfarin (COUMADIN) 5 MG tablet Take 5-7.5 mg by mouth daily. Take 5 mg once daily except on Monday and Friday take 7.5 mg.    . zolpidem (AMBIEN) 5 MG tablet TAKE ONE TABLET AT BEDTIME 30 tablet 1  . zolpidem (AMBIEN) 5 MG tablet Take 5 mg by mouth at bedtime as needed for sleep.       Assessment: 81 yo M on chronic Coumadin for Afib.  Home dose listed above.  INR therapeutic on admission, but has fallen below goal today.  Patient takes dose in the morning with breakfast at home.  No bleeding noted.  He was started on Zithromax for CAP  which can interact with warfarin and increase INR.  Goal of Therapy:  INR 2-3   Plan:  Coumadin 7.5 mg po x 1 dose today per home regimen INR daily  Biagio Borg 03/27/2015,8:41 AM

## 2015-03-27 NOTE — Progress Notes (Signed)
[facility] PROGRESS NOTE  Basir Niven BMW:[id number] DOB: 12-17-1925    PCP:   Claretta Fraise, MD   HPI: [name] is an 81 y.o. male admitted yesterday for altered mental status change, likely from high fever, having PNA, afib on anticoagulation, HTN, fall, and COPD exacerbation.   He was given IVF, IV antibiotics, and his HTN meds were adjusted.  He is feeling better today. He wanted to go home.   Rewiew of Systems:  Constitutional: Negative for malaise, fever and chills. No significant weight loss or weight gain Eyes: Negative for eye pain, redness and discharge, diplopia, visual changes, or flashes of light. ENMT: Negative for ear pain, hoarseness, nasal congestion, sinus pressure and sore throat. No headaches; tinnitus, drooling, or problem swallowing. Cardiovascular: Negative for chest pain, palpitations, diaphoresis, dyspnea and peripheral edema. ; No orthopnea, PND Respiratory: Negative for cough, hemoptysis, wheezing and stridor. No pleuritic chestpain. Gastrointestinal: Negative for nausea, vomiting, diarrhea, constipation, abdominal pain, melena, blood in stool, hematemesis, jaundice and rectal bleeding.    Genitourinary: Negative for frequency, dysuria, incontinence,flank pain and hematuria; Musculoskeletal: Negative for back pain and neck pain. Negative for swelling and trauma.;  Skin: . Negative for pruritus, rash, abrasions, bruising and skin lesion.; ulcerations Neuro: Negative for headache, lightheadedness and neck stiffness. Negative for weakness, altered level of consciousness , altered mental status, extremity weakness, burning feet, involuntary movement, seizure and syncope.  Psych: negative for anxiety, depression, insomnia, tearfulness, panic attacks, hallucinations, paranoia, suicidal or homicidal ideation    Past Medical History  Diagnosis Date  . Dilated cardiomyopathy     Felt to be secondary to atrial fibrillation. EF is about 40%  . Atrial  fibrillation     Persistent, treated with amiodarone and cardioversion  . Drug therapy     Chronic Coumadin therapy  . CAD (coronary artery disease)     Last catheterization in August 2008 demonstrated the LAD 40% stenosis  . Hypertension   . Dyslipidemia   . Hypothyroidism     Possibly related to amiodarone  . COPD (chronic obstructive pulmonary disease)     Past Surgical History  Procedure Laterality Date  . Hip surgery Left     8yo  . Cardiac catheterization  05/2007    LAD 40% stenosis. Stent in the LAD had 20% in-stent restenosis. Followed by 40-50% distal stenosis. 50% diagonal stenosis. 40-50% circumflex stenosis. 70% OM1 stenosis and 30% ostial RCA stenosis. 30% proximal RCA stenosis.  . Coronary stent placement      Medications:  HOME MEDS: Prior to Admission medications   Medication Sig Start Date End Date Taking? Authorizing Provider  amLODipine (NORVASC) 10 MG tablet TAKE 1 TABLET ONCE A DAY 11/08/14  Yes Minus Breeding, MD  Calcium Carb-Cholecalciferol (CALCIUM 500 +D PO) Take 1 tablet by mouth daily.    Yes Historical Provider, MD  CALCIUM PO Take 1 tablet by mouth daily.   Yes Historical Provider, MD  carvedilol (COREG) 12.5 MG tablet TAKE (1) TABLET TWICE A DAY. 03/21/15  Yes Claretta Fraise, MD  fenofibrate micronized (LOFIBRA) 134 MG capsule Take 1 capsule (134 mg total) by mouth daily before breakfast. 01/13/15  Yes Tammy Eckard, PHARMD  fish oil-omega-3 fatty acids 1000 MG capsule Take 2 g by mouth daily.   Yes Historical Provider, MD  gabapentin (NEURONTIN) 600 MG tablet Take 1 tablet (600 mg total) by mouth 3 (three) times daily. 03/06/15  Yes Claretta Fraise, MD  hydrochlorothiazide (HYDRODIURIL) 25 MG tablet TAKE 1 TABLET ONCE A DAY 12/07/14  Yes Chipper Herb, MD  ibuprofen (ADVIL,MOTRIN) 200 MG tablet Take 400 mg by mouth every 6 (six) hours as needed for moderate pain.   Yes Historical Provider, MD  levothyroxine (SYNTHROID, LEVOTHROID) 150 MCG tablet TAKE 1  TABLET DAILY 01/19/15  Yes Claretta Fraise, MD  lisinopril (PRINIVIL,ZESTRIL) 40 MG tablet TAKE 1 TABLET ONCE A DAY FOR HIGH BLOOD PRESSURE 11/30/14  Yes Chipper Herb, MD  nitroGLYCERIN (NITROSTAT) 0.4 MG SL tablet Place 1 tablet (0.4 mg total) under the tongue every 5 (five) minutes as needed for chest pain. 08/15/14  Yes Lysbeth Penner, FNP  tamsulosin (FLOMAX) 0.4 MG CAPS capsule Take 1 capsule (0.4 mg total) by mouth daily. 12/07/14  Yes Chipper Herb, MD  warfarin (COUMADIN) 5 MG tablet Take 5-7.5 mg by mouth daily. Take 5 mg once daily except on Monday and Friday take 7.5 mg.   Yes Historical Provider, MD  zolpidem (AMBIEN) 5 MG tablet TAKE ONE TABLET AT BEDTIME 02/13/15  Yes Claretta Fraise, MD  zolpidem (AMBIEN) 5 MG tablet Take 5 mg by mouth at bedtime as needed for sleep.   Yes Historical Provider, MD     Allergies:  Allergies  Allergen Reactions  . Penicillins Itching    Social History:   reports that he has never smoked. He has never used smokeless tobacco. He reports that he does not drink alcohol or use illicit drugs.  Family History: Family History  Problem Relation Age of Onset  . Adopted: Yes  . Heart attack Mother   . Heart attack Father   . Diabetes Other   . Stroke Other      Physical Exam: Filed Vitals:   03/26/15 1815 03/26/15 2231 03/27/15 0546 03/27/15 0913  BP: 155/56 143/45 164/48 130/60  Pulse: 69 62 69 70  Temp: 97.6 F (36.4 C) 97.9 F (36.6 C) 99 F (37.2 C)   TempSrc: Oral Oral Oral   Resp: 20 15 16    Height: 5\' 6"  (1.676 m)     Weight: 81.965 kg (180 lb 11.2 oz)     SpO2: 100% 98% 100%    Blood pressure 130/60, pulse 70, temperature 99 F (37.2 C), temperature source Oral, resp. rate 16, height 5\' 6"  (1.676 m), weight 81.965 kg (180 lb 11.2 oz), SpO2 100 %.  GEN:  Pleasant  patient lying in the stretcher in no acute distress; cooperative with exam. PSYCH:  alert and oriented x4; does not appear anxious or depressed; affect is  appropriate. HEENT: Mucous membranes pink and anicteric; PERRLA; EOM intact; no cervical lymphadenopathy nor thyromegaly or carotid bruit; no JVD; There were no stridor. Neck is very supple. Breasts:: Not examined CHEST WALL: No tenderness CHEST: Normal respiration, no wheezing, but with scattered rhonchi.  HEART: Irregular rate and rhythm.  There are no murmur, rub, or gallops.   BACK: No kyphosis or scoliosis; no CVA tenderness ABDOMEN: soft and non-tender; no masses, no organomegaly, normal abdominal bowel sounds; no pannus; no intertriginous candida. There is no rebound and no distention. Rectal Exam: Not done EXTREMITIES: No bone or joint deformity; age-appropriate arthropathy of the hands and knees; no edema; no ulcerations.  There is no calf tenderness. Genitalia: not examined PULSES: 2+ and symmetric SKIN: Normal hydration no rash or ulceration CNS: Cranial nerves 2-12 grossly intact no focal lateralizing neurologic deficit.  Speech is fluent; uvula elevated with phonation, facial symmetry and tongue midline. DTR are normal bilaterally, cerebella exam is intact, barbinski is negative and strengths are equaled  bilaterally.  No sensory loss.   Labs on Admission:  Basic Metabolic Panel:  Recent Labs Lab 03/26/15 1149  NA 133*  K 3.6  CL 102  CO2 21*  GLUCOSE 166*  BUN 37*  CREATININE 1.60*  CALCIUM 7.9*   Liver Function Tests:  Recent Labs Lab 03/26/15 1149  AST 27  ALT 29  ALKPHOS 33*  BILITOT 1.0  PROT 6.1*  ALBUMIN 3.2*   No results for input(s): LIPASE, AMYLASE in the last 168 hours. No results for input(s): AMMONIA in the last 168 hours. CBC:  Recent Labs Lab 03/26/15 1149  WBC 11.3*  NEUTROABS 9.8*  HGB 12.6*  HCT 38.1*  MCV 84.3  PLT 145*    Radiological Exams on Admission: Ct Head Wo Contrast  03/26/2015   CLINICAL DATA:  Fever.  Fall while watering the garden.  EXAM: CT HEAD WITHOUT CONTRAST  CT CERVICAL SPINE WITHOUT CONTRAST  TECHNIQUE:  Multidetector CT imaging of the head and cervical spine was performed following the standard protocol without intravenous contrast. Multiplanar CT image reconstructions of the cervical spine were also generated.  COMPARISON:  10/05/2013  FINDINGS: CT HEAD FINDINGS  The brain shows generalized atrophy. There chronic small-vessel ischemic changes affecting the brainstem. No focal cerebellar abnormality. The cerebral hemispheres do not show evidence of an old or acute stroke. 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 No evidence of obstructive hydrocephalus. The calvarium is unremarkable. Sinuses, middle ears and mastoids are clear.  CT CERVICAL SPINE FINDINGS  Alignment is normal. No fracture. No soft tissue swelling. Ordinary degenerative spondylosis at C3-4 and C4-5 an ordinary facet degeneration at C4-5, C5-6 and C6-7.  IMPRESSION: Head CT: No acute finding. Age related atrophy. 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  Cervical spine CT: No acute or traumatic finding. Ordinary chronic degenerative changes.   Electronically Signed   By: Nelson Chimes M.D.   On: 03/26/2015 13:55   Ct Cervical Spine Wo Contrast  03/26/2015   CLINICAL DATA:  Fever.  Fall while watering the garden.  EXAM: CT HEAD WITHOUT CONTRAST  CT CERVICAL SPINE WITHOUT CONTRAST  TECHNIQUE: Multidetector CT imaging of the head and cervical spine was performed following the standard protocol without intravenous contrast. Multiplanar CT image reconstructions of the cervical spine were also generated.  COMPARISON:  10/05/2013  FINDINGS: CT HEAD FINDINGS  The brain shows generalized atrophy. There chronic small-vessel ischemic changes affecting the brainstem. No focal cerebellar abnormality. The cerebral hemispheres do not show evidence of an old or acute stroke. 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 No evidence of obstructive hydrocephalus. The  calvarium is unremarkable. Sinuses, middle ears and mastoids are clear.  CT CERVICAL SPINE FINDINGS  Alignment is normal. No fracture. No soft tissue swelling. Ordinary degenerative spondylosis at C3-4 and C4-5 an ordinary facet degeneration at C4-5, C5-6 and C6-7.  IMPRESSION: Head CT: No acute finding. Age related atrophy. 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  Cervical spine CT: No acute or traumatic finding. Ordinary chronic degenerative changes.   Electronically Signed   By: Nelson Chimes M.D.   On: 03/26/2015 13:55   Dg Chest Port 1 View  03/26/2015   CLINICAL DATA:  Fever for 2 days. Fall. Atrial fibrillation. Coronary artery disease. COPD.  EXAM: PORTABLE CHEST - 1 VIEW  COMPARISON:  01/27/2014  FINDINGS: Two  frontal views of the chest. Minimal patient rotation to the right. Hyperinflation. Midline trachea. Mild cardiomegaly with transverse aortic atherosclerosis. No pleural effusion or pneumothorax. Diffuse peribronchial thickening. Patchy left base airspace disease.  IMPRESSION: Hyperinflation with patchy left base airspace disease. Although this could represent atelectasis or progressive scar, given the clinical history, suspicious for early pneumonia.  Cardiomegaly and atherosclerosis.   Electronically Signed   By: Abigail Miyamoto M.D.   On: 03/26/2015 12:59   Dg Foot Complete Right  03/26/2015   CLINICAL DATA:  Right foot pain and swelling. Recent falls. Initial encounter.  EXAM: RIGHT FOOT COMPLETE - 3+ VIEW  COMPARISON:  None.  FINDINGS: The bones are diffusely demineralized. No evidence of acute fracture or dislocation. There are scattered mild arthropathic changes, greatest in the midfoot and first metatarsal phalangeal joint. There is medial soft tissue prominence at the first MTP joint. There are possible small erosions medially in the first metatarsal head. There appears to be mild dorsal forefoot soft tissue  swelling.  IMPRESSION: Osteopenia without acute osseous findings. Arthropathic changes as described with dorsal forefoot soft tissue swelling and potential changes of gout at the first MTP joint.   Electronically Signed   By: Richardean Sale M.D.   On: 03/26/2015 15:05   Assessment/Plan Present on Admission:  . CAP (community acquired pneumonia) . Essential hypertension . Coronary atherosclerosis  PLAN:   CAP: Will continue with IV antibiotics.  He would like to go home today, but given that he has PNA, will keep him inpatient, continue with IV antibiotics, and increase ambulation.  For his afib, will continue Coumadin.  His Norvasc was held, and will follow his BP.  Hopefully, without Norvasc, he will have less orthostasis, and hypotension.  He is stable.   Other plans as per orders.  Code Status:  FULL Haskel Khan, MD. [facility] Pager [phone] 7pm to 7am.  03/27/2015, 10:34 AM

## 2015-03-28 LAB — PROTIME-INR
INR: 1.59 — ABNORMAL HIGH (ref 0.00–1.49)
PROTHROMBIN TIME: 19 s — AB (ref 11.6–15.2)

## 2015-03-28 MED ORDER — PANTOPRAZOLE SODIUM 40 MG PO TBEC: 40.0000 mg | DELAYED_RELEASE_TABLET | Freq: Every day | ORAL | Status: DC

## 2015-03-28 MED ORDER — AZITHROMYCIN 250 MG PO TABS: ORAL_TABLET | ORAL | Status: DC

## 2015-03-28 MED ORDER — WARFARIN SODIUM 5 MG PO TABS
10.0000 mg | ORAL_TABLET | Freq: Once | ORAL | Status: AC
  Administered 2015-03-28: 10 mg via ORAL
  Filled 2015-03-28: qty 2

## 2015-03-28 MED ORDER — AZITHROMYCIN 250 MG PO TABS
500.0000 mg | ORAL_TABLET | Freq: Every day | ORAL | Status: DC
  Administered 2015-03-28: 500 mg via ORAL
  Filled 2015-03-28: qty 2

## 2015-03-28 MED ORDER — CEFUROXIME AXETIL 500 MG PO TABS: 500.0000 mg | ORAL_TABLET | Freq: Two times a day (BID) | ORAL | Status: DC

## 2015-03-28 NOTE — Progress Notes (Addendum)
[place] for Coumadin Indication: atrial fibrillation  Allergies  Allergen Reactions  . Penicillins Itching    Patient Measurements: Height: 5\' 6"  (167.6 cm) Weight: 180 lb 11.2 oz (81.965 kg) IBW/kg (Calculated) : 63.8  Vital Signs: Temp: 98.1 F (36.7 C) (06/14 0544) Temp Source: Oral (06/14 0544) BP: 135/62 mmHg (06/14 0544) Pulse Rate: 63 (06/14 0544)  Labs:  Recent Labs  03/26/15 1149 03/27/15 0602 03/28/15 0623  HGB 12.6*  --   --   HCT 38.1*  --   --   PLT 145*  --   --   LABPROT 24.7* 21.0* 19.0*  INR 2.25* 1.82* 1.59*  CREATININE 1.60*  --   --     Estimated Creatinine Clearance: 32.7 mL/min (by C-G formula based on Cr of 1.6).   Medical History: Past Medical History  Diagnosis Date  . Dilated cardiomyopathy     Felt to be secondary to atrial fibrillation. EF is about 40%  . Atrial fibrillation     Persistent, treated with amiodarone and cardioversion  . Drug therapy     Chronic Coumadin therapy  . CAD (coronary artery disease)     Last catheterization in August 2008 demonstrated the LAD 40% stenosis  . Hypertension   . Dyslipidemia   . Hypothyroidism     Possibly related to amiodarone  . COPD (chronic obstructive pulmonary disease)     Medications:  Medications Prior to Admission  Medication Sig Dispense Refill  . amLODipine (NORVASC) 10 MG tablet TAKE 1 TABLET ONCE A DAY 30 tablet 5  . Calcium Carb-Cholecalciferol (CALCIUM 500 +D PO) Take 1 tablet by mouth daily.     Marland Kitchen CALCIUM PO Take 1 tablet by mouth daily.    . carvedilol (COREG) 12.5 MG tablet TAKE (1) TABLET TWICE A DAY. 60 tablet 4  . fenofibrate micronized (LOFIBRA) 134 MG capsule Take 1 capsule (134 mg total) by mouth daily before breakfast. 90 capsule 1  . fish oil-omega-3 fatty acids 1000 MG capsule Take 2 g by mouth daily.    Marland Kitchen gabapentin (NEURONTIN) 600 MG tablet Take 1 tablet (600 mg total) by mouth 3 (three) times daily. 60 tablet 5  .  hydrochlorothiazide (HYDRODIURIL) 25 MG tablet TAKE 1 TABLET ONCE A DAY 90 tablet 1  . ibuprofen (ADVIL,MOTRIN) 200 MG tablet Take 400 mg by mouth every 6 (six) hours as needed for moderate pain.    Marland Kitchen levothyroxine (SYNTHROID, LEVOTHROID) 150 MCG tablet TAKE 1 TABLET DAILY 30 tablet 9  . lisinopril (PRINIVIL,ZESTRIL) 40 MG tablet TAKE 1 TABLET ONCE A DAY FOR HIGH BLOOD PRESSURE 30 tablet 3  . nitroGLYCERIN (NITROSTAT) 0.4 MG SL tablet Place 1 tablet (0.4 mg total) under the tongue every 5 (five) minutes as needed for chest pain. 25 tablet 1  . tamsulosin (FLOMAX) 0.4 MG CAPS capsule Take 1 capsule (0.4 mg total) by mouth daily. 30 capsule 5  . warfarin (COUMADIN) 5 MG tablet Take 5-7.5 mg by mouth daily. Take 5 mg once daily except on Monday and Friday take 7.5 mg.    . zolpidem (AMBIEN) 5 MG tablet TAKE ONE TABLET AT BEDTIME 30 tablet 1  . zolpidem (AMBIEN) 5 MG tablet Take 5 mg by mouth at bedtime as needed for sleep.       Assessment: 81 yo M on chronic Coumadin for Afib.  Home dose listed above.  INR therapeutic on admission, but has trended down on home regimen.  Patient takes dose in the  morning with breakfast at home.  No bleeding noted.  He was started on Zithromax for CAP which can interact with warfarin and increase INR.  Goal of Therapy:  INR 2-3   Plan:  Coumadin 10 mg po x 1 now (to boost INR)  INR daily Change Zithromax to PO per P&T policy (see below) Anticipate d/c home soon on previous home regimen  Biagio Borg 03/28/2015,8:27 AM  PHARMACIST - PHYSICIAN COMMUNICATION CONCERNING: Antibiotic IV to Oral Route Change Policy  RECOMMENDATION: This patient is receiving Zithromax by the intravenous route.  Based on criteria approved by the Pharmacy and Therapeutics Committee, the antibiotic(s) is/are being converted to the equivalent oral dose form(s).   DESCRIPTION: These criteria include:  Patient being treated for a respiratory tract infection, urinary  tract infection, cellulitis or clostridium difficile associated diarrhea if on metronidazole  The patient is not neutropenic and does not exhibit a GI malabsorption state  The patient is eating (either orally or via tube) and/or has been taking other orally administered medications for a least 24 hours  The patient is improving clinically and has a Tmax < 100.5  If you have questions about this conversion, please contact the Pharmacy Department  [x]   [phone] )  [facility] []   [phone] )  [facility] []   [phone] )  [facility] []   [phone] )  [facility] []   [phone] )  [facility]

## 2015-03-28 NOTE — Progress Notes (Signed)
[name] discharged home alone per MD order.  Discharge instructions reviewed and discussed with the patient, all questions and concerns answered. Copy of instructions and scripts given to patient.    Medication List    STOP taking these medications        amLODipine 10 MG tablet  Commonly known as:  NORVASC     CALCIUM 500 +D PO     ibuprofen 200 MG tablet  Commonly known as:  ADVIL,MOTRIN     nitroGLYCERIN 0.4 MG SL tablet  Commonly known as:  NITROSTAT     zolpidem 5 MG tablet  Commonly known as:  AMBIEN      TAKE these medications        azithromycin 250 MG tablet  Commonly known as:  ZITHROMAX  Take one pill per day for 4 days.     CALCIUM PO  Take 1 tablet by mouth daily.     carvedilol 12.5 MG tablet  Commonly known as:  COREG  TAKE (1) TABLET TWICE A DAY.     cefUROXime 500 MG tablet  Commonly known as:  CEFTIN  Take 1 tablet (500 mg total) by mouth 2 (two) times daily with a meal.     fenofibrate micronized 134 MG capsule  Commonly known as:  LOFIBRA  Take 1 capsule (134 mg total) by mouth daily before breakfast.     fish oil-omega-3 fatty acids 1000 MG capsule  Take 2 g by mouth daily.     gabapentin 600 MG tablet  Commonly known as:  NEURONTIN  Take 1 tablet (600 mg total) by mouth 3 (three) times daily.     hydrochlorothiazide 25 MG tablet  Commonly known as:  HYDRODIURIL  TAKE 1 TABLET ONCE A DAY     levothyroxine 150 MCG tablet  Commonly known as:  SYNTHROID, LEVOTHROID  TAKE 1 TABLET DAILY     lisinopril 40 MG tablet  Commonly known as:  PRINIVIL,ZESTRIL  TAKE 1 TABLET ONCE A DAY FOR HIGH BLOOD PRESSURE     pantoprazole 40 MG tablet  Commonly known as:  PROTONIX  Take 1 tablet (40 mg total) by mouth daily at 6 (six) AM.     tamsulosin 0.4 MG Caps capsule  Commonly known as:  FLOMAX  Take 1 capsule (0.4 mg total) by mouth daily.     warfarin 5 MG tablet  Commonly known as:  COUMADIN  Take 5-7.5 mg by mouth daily. Take 5 mg once  daily except on Monday and Friday take 7.5 mg.        IV site discontinued and catheter remains intact. Site without signs and symptoms of complications. Dressing and pressure applied.  Patient escorted to car by Lovena Le, RN in a wheelchair,  no distress noted upon discharge.  Regino Bellow 03/28/2015 3:47 PM

## 2015-03-28 NOTE — Discharge Summary (Signed)
Physician Discharge Summary  [name] UEK:[id number] DOB: 1925/10/20 DOA: 03/26/2015  PCP: Claretta Fraise, MD  Admit date: 03/26/2015 Discharge date: 03/28/2015  Time spent: 45 minutes  Recommendations for Outpatient Follow-up:  1. Follow up with your PCP in one week.    Discharge Diagnoses:  Principal Problem:   CAP (community acquired pneumonia) Active Problems:   Essential hypertension   Coronary atherosclerosis   Long term current use of anticoagulant therapy   Fall at home   Discharge Condition: Much improved.   Diet recommendation: As tolerated.   Filed Weights   03/26/15 1258 03/26/15 1815  Weight: 84.823 kg (187 lb) 81.965 kg (180 lb 11.2 oz)    History of present illness: patient was admitted by me for CAP, fall likely from orthostasis, and Temp of 105 with confusion.  As per my prior H and P:  " [name] is an 81 y.o. male with hx known CAD, s/p cardiac stent many years ago, hx of afib on Coumadin, HTN, HLD, COPD, lives alone with close family living nearby, active, still mowing grass himself, brought to the ER as he was confused, having fever with T of 105, lightheaded, and fell upon standing up. He was evaluated in the ER, found to have no fever, mild leukocytosis, and with elevated BUN./ Cr to 37 and 1.6. He also has a CXR with possible early developing PNA, head CT with no acute changes, with mucoid cyst unchanged and no hydrocephalus, cervical CT with no Fx, and foot xay showed possible gouty arthritis. He was given IV Zithromax and IV Recephin, and hospitalist was asked to admit him for CAP, dehydration, and confusion.  Hospital Course: patient was admitted into the hospital and was started on IV Rocephin and IV Zithromax.  He did well and hadn't felt lightheadedness any longer.  He was given dT and toxoid, and his BP was cut back.  His Norvasc was discontinued, but his ACE I was given.  For his afib, his rate was controlled, and he was continued on his  Coumadin.  He is anxious to go home, and is stable for discharge.  He will continue another four days of Zithromax at 250mg  per day, and continue with Ceftin for another five days.  He will follow up with his PCP in one week.  Caution was given with standing up slowly was given.  He will also resume his diuretics upon discharge as well.   Thank you for allowing me to participate in his care.   Discharge Exam: Filed Vitals:   03/28/15 1044  BP:   Pulse: 73  Temp:   Resp:     General: appears well.  Cardiovascular:S1S2 Regular.  Respiratory: Clear lungs.   Discharge Instructions   Discharge Instructions    Diet - low sodium heart healthy    Complete by:  As directed      Discharge instructions    Complete by:  As directed   Follow up with your PCP in one week.  Be careful not to fall.  Careful when you standing up.  Do it slowly.     Increase activity slowly    Complete by:  As directed           Current Discharge Medication List    START taking these medications   Details  azithromycin (ZITHROMAX) 250 MG tablet Take one pill per day for 4 days. Qty: 4 each, Refills: 0    cefUROXime (CEFTIN) 500 MG tablet Take 1 tablet (500 mg total)  by mouth 2 (two) times daily with a meal. Qty: 10 tablet, Refills: 0    pantoprazole (PROTONIX) 40 MG tablet Take 1 tablet (40 mg total) by mouth daily at 6 (six) AM. Qty: 30 tablet, Refills: 2      CONTINUE these medications which have NOT CHANGED   Details  CALCIUM PO Take 1 tablet by mouth daily.    carvedilol (COREG) 12.5 MG tablet TAKE (1) TABLET TWICE A DAY. Qty: 60 tablet, Refills: 4    fenofibrate micronized (LOFIBRA) 134 MG capsule Take 1 capsule (134 mg total) by mouth daily before breakfast. Qty: 90 capsule, Refills: 1    fish oil-omega-3 fatty acids 1000 MG capsule Take 2 g by mouth daily.    gabapentin (NEURONTIN) 600 MG tablet Take 1 tablet (600 mg total) by mouth 3 (three) times daily. Qty: 60 tablet, Refills: 5    Associated Diagnoses: Hereditary and idiopathic peripheral neuropathy    hydrochlorothiazide (HYDRODIURIL) 25 MG tablet TAKE 1 TABLET ONCE A DAY Qty: 90 tablet, Refills: 1    levothyroxine (SYNTHROID, LEVOTHROID) 150 MCG tablet TAKE 1 TABLET DAILY Qty: 30 tablet, Refills: 9    lisinopril (PRINIVIL,ZESTRIL) 40 MG tablet TAKE 1 TABLET ONCE A DAY FOR HIGH BLOOD PRESSURE Qty: 30 tablet, Refills: 3    tamsulosin (FLOMAX) 0.4 MG CAPS capsule Take 1 capsule (0.4 mg total) by mouth daily. Qty: 30 capsule, Refills: 5    warfarin (COUMADIN) 5 MG tablet Take 5-7.5 mg by mouth daily. Take 5 mg once daily except on Monday and Friday take 7.5 mg.      STOP taking these medications     amLODipine (NORVASC) 10 MG tablet      Calcium Carb-Cholecalciferol (CALCIUM 500 +D PO)      ibuprofen (ADVIL,MOTRIN) 200 MG tablet      nitroGLYCERIN (NITROSTAT) 0.4 MG SL tablet      zolpidem (AMBIEN) 5 MG tablet      zolpidem (AMBIEN) 5 MG tablet        Allergies  Allergen Reactions  . Penicillins Itching      The results of significant diagnostics from this hospitalization (including imaging, microbiology, ancillary and laboratory) are listed below for reference.    Significant Diagnostic Studies: Ct Head Wo Contrast  03/26/2015   CLINICAL DATA:  Fever.  Fall while watering the garden.  EXAM: CT HEAD WITHOUT CONTRAST  CT CERVICAL SPINE WITHOUT CONTRAST  TECHNIQUE: Multidetector CT imaging of the head and cervical spine was performed following the standard protocol without intravenous contrast. Multiplanar CT image reconstructions of the cervical spine were also generated.  COMPARISON:  10/05/2013  FINDINGS: CT HEAD FINDINGS  The brain shows generalized atrophy. There chronic small-vessel ischemic changes affecting the brainstem. No focal cerebellar abnormality. The cerebral hemispheres do not show evidence of an old or acute stroke. 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 No evidence of  obstructive hydrocephalus. The calvarium is unremarkable. Sinuses, middle ears and mastoids are clear.  CT CERVICAL SPINE FINDINGS  Alignment is normal. No fracture. No soft tissue swelling. Ordinary degenerative spondylosis at C3-4 and C4-5 an ordinary facet degeneration at C4-5, C5-6 and C6-7.  IMPRESSION: Head CT: No acute finding. Age related atrophy. 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  Cervical spine CT: No acute or traumatic finding. Ordinary chronic degenerative changes.   Electronically Signed   By: Nelson Chimes M.D.   On: 03/26/2015 13:55  Ct Cervical Spine Wo Contrast  03/26/2015   CLINICAL DATA:  Fever.  Fall while watering the garden.  EXAM: CT HEAD WITHOUT CONTRAST  CT CERVICAL SPINE WITHOUT CONTRAST  TECHNIQUE: Multidetector CT imaging of the head and cervical spine was performed following the standard protocol without intravenous contrast. Multiplanar CT image reconstructions of the cervical spine were also generated.  COMPARISON:  10/05/2013  FINDINGS: CT HEAD FINDINGS  The brain shows generalized atrophy. There chronic small-vessel ischemic changes affecting the brainstem. No focal cerebellar abnormality. The cerebral hemispheres do not show evidence of an old or acute stroke. 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 No evidence of obstructive hydrocephalus. The calvarium is unremarkable. Sinuses, middle ears and mastoids are clear.  CT CERVICAL SPINE FINDINGS  Alignment is normal. No fracture. No soft tissue swelling. Ordinary degenerative spondylosis at C3-4 and C4-5 an ordinary facet degeneration at C4-5, C5-6 and C6-7.  IMPRESSION: Head CT: No acute finding. Age related atrophy. 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  Cervical spine CT: No acute or traumatic finding. Ordinary chronic degenerative changes.   Electronically  Signed   By: Nelson Chimes M.D.   On: 03/26/2015 13:55   Dg Chest Port 1 View  03/26/2015   CLINICAL DATA:  Fever for 2 days. Fall. Atrial fibrillation. Coronary artery disease. COPD.  EXAM: PORTABLE CHEST - 1 VIEW  COMPARISON:  01/27/2014  FINDINGS: Two frontal views of the chest. Minimal patient rotation to the right. Hyperinflation. Midline trachea. Mild cardiomegaly with transverse aortic atherosclerosis. No pleural effusion or pneumothorax. Diffuse peribronchial thickening. Patchy left base airspace disease.  IMPRESSION: Hyperinflation with patchy left base airspace disease. Although this could represent atelectasis or progressive scar, given the clinical history, suspicious for early pneumonia.  Cardiomegaly and atherosclerosis.   Electronically Signed   By: Abigail Miyamoto M.D.   On: 03/26/2015 12:59   Dg Foot Complete Right  03/26/2015   CLINICAL DATA:  Right foot pain and swelling. Recent falls. Initial encounter.  EXAM: RIGHT FOOT COMPLETE - 3+ VIEW  COMPARISON:  None.  FINDINGS: The bones are diffusely demineralized. No evidence of acute fracture or dislocation. There are scattered mild arthropathic changes, greatest in the midfoot and first metatarsal phalangeal joint. There is medial soft tissue prominence at the first MTP joint. There are possible small erosions medially in the first metatarsal head. There appears to be mild dorsal forefoot soft tissue swelling.  IMPRESSION: Osteopenia without acute osseous findings. Arthropathic changes as described with dorsal forefoot soft tissue swelling and potential changes of gout at the first MTP joint.   Electronically Signed   By: Richardean Sale M.D.   On: 03/26/2015 15:05    Microbiology: Recent Results (from the past 240 hour(s))  Urine culture     Status: None   Collection Time: 03/26/15 11:40 AM  Result Value Ref Range Status   Specimen Description URINE, CLEAN CATCH  Final   Special Requests NONE  Final   Colony Count   Final    2,000  COLONIES/ML Performed at [facility]    Culture   Final    INSIGNIFICANT GROWTH Performed at [facility]    Report Status 03/27/2015 FINAL  Final  Blood Culture (routine x 2)     Status: None (Preliminary result)   Collection Time: 03/26/15 11:49 AM  Result Value Ref Range Status   Specimen Description RIGHT ANTECUBITAL  Final  Special Requests BOTTLES DRAWN AEROBIC AND ANAEROBIC 6CC EACH  Final   Culture NO GROWTH 2 DAYS  Final   Report Status PENDING  Incomplete  Blood Culture (routine x 2)     Status: None (Preliminary result)   Collection Time: 03/26/15 12:27 PM  Result Value Ref Range Status   Specimen Description BLOOD RIGHT ANTECUBITAL DRAWN BY RN [facility]  Final   Special Requests BOTTLES DRAWN AEROBIC AND ANAEROBIC 6C EACH  Final   Culture NO GROWTH 2 DAYS  Final   Report Status PENDING  Incomplete     Labs: Basic Metabolic Panel:  Recent Labs Lab 03/26/15 1149  NA 133*  K 3.6  CL 102  CO2 21*  GLUCOSE 166*  BUN 37*  CREATININE 1.60*  CALCIUM 7.9*   Liver Function Tests:  Recent Labs Lab 03/26/15 1149  AST 27  ALT 29  ALKPHOS 33*  BILITOT 1.0  PROT 6.1*  ALBUMIN 3.2*   No results for input(s): LIPASE, AMYLASE in the last 168 hours. No results for input(s): AMMONIA in the last 168 hours. CBC:  Recent Labs Lab 03/26/15 1149  WBC 11.3*  NEUTROABS 9.8*  HGB 12.6*  HCT 38.1*  MCV 84.3  PLT 145*   Signed:  Arleigh Dicola  [facility] 03/28/2015, 2:34 PM

## 2015-03-28 NOTE — Care Management Note (Signed)
Case Management Note  Patient Details  Name: [name] MRN: [id number] Date of Birth: [birth year]  Subjective/Objective:                    Action/Plan:   Expected Discharge Date:                  Expected Discharge Plan:  Home/Self Care  In-House Referral:  NA  Discharge planning Services  CM Consult  Post Acute Care Choice:  NA Choice offered to:  NA  DME Arranged:    DME Agency:     HH Arranged:    HH Agency:     Status of Service:  Completed, signed off  Medicare Important Message Given:  N/A - LOS <3 / Initial given by admissions Date Medicare IM Given:    Medicare IM give by:    Date Additional Medicare IM Given:    Additional Medicare Important Message give by:     If discussed at [place] of Stay Meetings, dates discussed:    Additional Comments: Pt discharged home today. No CM needs noted. Christinia Gully [place], RN 03/28/2015, 2:38 PM

## 2015-03-29 ENCOUNTER — Other Ambulatory Visit: Payer: Self-pay | Admitting: Family Medicine

## 2015-04-03 ENCOUNTER — Encounter: Payer: Self-pay | Admitting: Family

## 2015-04-03 ENCOUNTER — Ambulatory Visit (INDEPENDENT_AMBULATORY_CARE_PROVIDER_SITE_OTHER): Payer: Medicare Other | Admitting: Family

## 2015-04-03 VITALS — BP 130/85 | HR 65 | Temp 97.6°F | Ht 66.0 in | Wt 184.2 lb

## 2015-04-03 DIAGNOSIS — S90862A Insect bite (nonvenomous), left foot, initial encounter: Secondary | ICD-10-CM | POA: Diagnosis not present

## 2015-04-03 DIAGNOSIS — I482 Chronic atrial fibrillation, unspecified: Secondary | ICD-10-CM

## 2015-04-03 DIAGNOSIS — I480 Paroxysmal atrial fibrillation: Secondary | ICD-10-CM | POA: Diagnosis not present

## 2015-04-03 DIAGNOSIS — W57XXXA Bitten or stung by nonvenomous insect and other nonvenomous arthropods, initial encounter: Secondary | ICD-10-CM

## 2015-04-03 DIAGNOSIS — R609 Edema, unspecified: Secondary | ICD-10-CM | POA: Diagnosis not present

## 2015-04-03 LAB — POCT INR: INR: 1.8

## 2015-04-03 LAB — CULTURE, BLOOD (ROUTINE X 2)
CULTURE: NO GROWTH
CULTURE: NO GROWTH

## 2015-04-03 MED ORDER — FUROSEMIDE 20 MG PO TABS: 20.0000 mg | ORAL_TABLET | Freq: Every day | ORAL | Status: DC

## 2015-04-03 NOTE — Patient Instructions (Addendum)
Peripheral Edema You have swelling in your legs (peripheral edema). This swelling is due to excess accumulation of salt and water in your body. Edema may be a sign of heart, kidney or liver disease, or a side effect of a medication. It may also be due to problems in the leg veins. Elevating your legs and using special support stockings may be very helpful, if the cause of the swelling is due to poor venous circulation. Avoid long periods of standing, whatever the cause. Treatment of edema depends on identifying the cause. Chips, pretzels, pickles and other salty foods should be avoided. Restricting salt in your diet is almost always needed. Water pills (diuretics) are often used to remove the excess salt and water from your body via urine. These medicines prevent the kidney from reabsorbing sodium. This increases urine flow. Diuretic treatment may also result in lowering of potassium levels in your body. Potassium supplements may be needed if you have to use diuretics daily. Daily weights can help you keep track of your progress in clearing your edema. You should call your caregiver for follow up care as recommended. SEEK IMMEDIATE MEDICAL CARE IF:   You have increased swelling, pain, redness, or heat in your legs.  You develop shortness of breath, especially when lying down.  You develop chest or abdominal pain, weakness, or fainting.  You have a fever. Document Released: 11/07/2004 Document Revised: 12/23/2011 Document Reviewed: 10/18/2009 [facility] Patient Information 2015 [place], [place]. This information is not intended to replace advice given to you by your health care provider. Make sure you discuss any questions you have with your health care provider.   Anticoagulation Dose Instructions as of 04/03/2015      Dorene Grebe Tue Wed Thu Fri Sat   New Dose 5 mg 7.5 mg 5 mg 7.5 mg 5 mg 7.5 mg 5 mg    Description        Continue warfarin dose of 1 tablet daily except 1 and 1/2 on mondays and  fridays. Pt to take one .5 mg today extra

## 2015-04-03 NOTE — Patient Instructions (Signed)
Peripheral Edema You have swelling in your legs (peripheral edema). This swelling is due to excess accumulation of salt and water in your body. Edema may be a sign of heart, kidney or liver disease, or a side effect of a medication. It may also be due to problems in the leg veins. Elevating your legs and using special support stockings may be very helpful, if the cause of the swelling is due to poor venous circulation. Avoid long periods of standing, whatever the cause. Treatment of edema depends on identifying the cause. Chips, pretzels, pickles and other salty foods should be avoided. Restricting salt in your diet is almost always needed. Water pills (diuretics) are often used to remove the excess salt and water from your body via urine. These medicines prevent the kidney from reabsorbing sodium. This increases urine flow. Diuretic treatment may also result in lowering of potassium levels in your body. Potassium supplements may be needed if you have to use diuretics daily. Daily weights can help you keep track of your progress in clearing your edema. You should call your caregiver for follow up care as recommended. SEEK IMMEDIATE MEDICAL CARE IF:   You have increased swelling, pain, redness, or heat in your legs.  You develop shortness of breath, especially when lying down.  You develop chest or abdominal pain, weakness, or fainting.  You have a fever. Document Released: 11/07/2004 Document Revised: 12/23/2011 Document Reviewed: 10/18/2009 [facility] Patient Information 2015 [place], [place]. This information is not intended to replace advice given to you by your health care provider. Make sure you discuss any questions you have with your health care provider.   Anticoagulation Dose Instructions as of 04/03/2015      Dorene Grebe Tue Wed Thu Fri Sat   New Dose 5 mg 7.5 mg 5 mg 7.5 mg 5 mg 7.5 mg 5 mg    Description        Continue warfarin dose of 1 tablet daily except 1 and 1/2 on mondays and  fridays. Pt to take one .5 mg today extra

## 2015-04-03 NOTE — Progress Notes (Signed)
   Subjective:    Patient ID: [name], male    DOB: 1926/10/05, 81 y.o.   MRN: [id number]  HPI Pt presents to the office today for tick on left foot in between left great toe and second toe. Pt states he noticed the tick this morning. Pt states he did not try to remove it and thought it was best if he came to the doctor.   Pt also is complaining of bilateral lower feet swelling. Pt states he was admitted to the hospital a week ago for pneumonia. Pt states since going into the hospital. Pt states he fell before going into the hospital and had x-rays on foot which was negative.    Review of Systems  Constitutional: Negative.   HENT: Negative.   Respiratory: Negative.   Cardiovascular: Positive for leg swelling.  Gastrointestinal: Negative.   Endocrine: Negative.   Genitourinary: Negative.   Musculoskeletal: Negative.   Neurological: Negative.   Hematological: Negative.   Psychiatric/Behavioral: Negative.   All other systems reviewed and are negative.      Objective:   Physical Exam  Constitutional: He is oriented to person, place, and time. He appears well-developed and well-nourished. No distress.  HENT:  Head: Normocephalic.  Eyes: Pupils are equal, round, and reactive to light. Right eye exhibits no discharge. Left eye exhibits no discharge.  Neck: Normal range of motion. Neck supple. No thyromegaly present.  Cardiovascular: Normal rate, regular rhythm, normal heart sounds and intact distal pulses.   No murmur heard. Pulmonary/Chest: Effort normal and breath sounds normal. No respiratory distress. He has no wheezes.  Abdominal: Soft. Bowel sounds are normal. He exhibits no distension. There is no tenderness.  Musculoskeletal: Normal range of motion. He exhibits edema (3+ in right foot and 2+ in left foot). He exhibits no tenderness.  Neurological: He is alert and oriented to person, place, and time. He has normal reflexes. No cranial nerve deficit.  Skin: Skin is warm and  dry. No rash noted. No erythema.  Psychiatric: He has a normal mood and affect. His behavior is normal. Judgment and thought content normal.  Vitals reviewed.  BP 130/85 mmHg  Pulse 65  Temp(Src) 97.6 F (36.4 C) (Oral)  Ht _0  (1.676 m)  Wt 184 lb 3.2 oz (83.553 kg)  BMI 29.75 kg/m2   Large 1.5 cm tick in between left great toe and second left toe. Area cleaned with Alcohol and tick removed with head intact. Gauze applied.  1.8    Assessment & Plan:  1. Chronic atrial fibrillation - Anticoagulation Dose Instructions as of 04/03/2015      Dorene Grebe Tue Wed Thu Fri Sat   New Dose 5 mg 7.5 mg 5 mg 7.5 mg 5 mg 7.5 mg 5 mg    Description        Continue warfarin dose of 1 tablet daily except 1 and 1/2 on mondays and fridays. Pt to take one .5 mg today extra     -Make appt to see Tammy next Monday - POCT INR  2. Paroxysmal atrial fibrillation  3. Peripheral edema -Keep feel elevated -Compression stockings -RTO for follow up  - furosemide (LASIX) 20 MG tablet; Take 1 tablet (20 mg total) by mouth daily.  Dispense: 30 tablet; Refill: 3 - CMP14+EGFR  4. Tick bite of foot, left, initial encounter -Keep area clean and dry -Report any rash or fever  Evelina Dun, FNP

## 2015-04-04 LAB — CMP14+EGFR
ALBUMIN: 3.7 g/dL (ref 3.5–4.7)
ALT: 20 IU/L (ref 0–44)
AST: 20 IU/L (ref 0–40)
Albumin/Globulin Ratio: 1.4 (ref 1.1–2.5)
Alkaline Phosphatase: 47 IU/L (ref 39–117)
BUN/Creatinine Ratio: 27 — ABNORMAL HIGH (ref 10–22)
BUN: 36 mg/dL — ABNORMAL HIGH (ref 8–27)
Bilirubin Total: 0.3 mg/dL (ref 0.0–1.2)
CALCIUM: 9.2 mg/dL (ref 8.6–10.2)
CHLORIDE: 99 mmol/L (ref 97–108)
CO2: 23 mmol/L (ref 18–29)
Creatinine, Ser: 1.32 mg/dL — ABNORMAL HIGH (ref 0.76–1.27)
GFR calc Af Amer: 56 mL/min/{1.73_m2} — ABNORMAL LOW (ref >59)
GFR calc non Af Amer: 48 mL/min/{1.73_m2} — ABNORMAL LOW (ref >59)
GLOBULIN, TOTAL: 2.7 g/dL (ref 1.5–4.5)
GLUCOSE: 129 mg/dL — AB (ref 65–99)
POTASSIUM: 4.9 mmol/L (ref 3.5–5.2)
SODIUM: 138 mmol/L (ref 134–144)
Total Protein: 6.4 g/dL (ref 6.0–8.5)

## 2015-04-10 ENCOUNTER — Ambulatory Visit (INDEPENDENT_AMBULATORY_CARE_PROVIDER_SITE_OTHER): Payer: Medicare Other | Admitting: Pharmacist

## 2015-04-10 DIAGNOSIS — I482 Chronic atrial fibrillation, unspecified: Secondary | ICD-10-CM

## 2015-04-10 DIAGNOSIS — I480 Paroxysmal atrial fibrillation: Secondary | ICD-10-CM

## 2015-04-10 LAB — POCT INR: INR: 2

## 2015-04-10 NOTE — Patient Instructions (Signed)
Take 2 tablets of furosemide 20mg  today and tomorrow to help with fluid retention / swelling.  Then restart furosemide 20mg  take 1 tablet daily.  Stop hydrochlorothiazide 25mg  tablets.   Anticoagulation Dose Instructions as of 04/10/2015      Dorene Grebe Tue Wed Thu Fri Sat   New Dose 5 mg 7.5 mg 5 mg 7.5 mg 5 mg 7.5 mg 5 mg    Description        Continue warfarin dose of 1 tablet daily except 1 and 1/2 on mondays, wednesdays and fridays.       INR was 2.0 today

## 2015-04-13 ENCOUNTER — Telehealth: Payer: Self-pay | Admitting: Family Medicine

## 2015-04-13 NOTE — Telephone Encounter (Signed)
Patient complains of swelling in his hand and elbow  He was admitted to the hospital 2 weeks ago and this has persisted since then.  Appt scheduled for tomorrow. Patient aware.

## 2015-04-14 ENCOUNTER — Encounter: Payer: Self-pay | Admitting: Physician Assistant

## 2015-04-14 ENCOUNTER — Ambulatory Visit (INDEPENDENT_AMBULATORY_CARE_PROVIDER_SITE_OTHER): Payer: Medicare Other | Admitting: Physician Assistant

## 2015-04-14 VITALS — BP 145/63 | HR 72 | Temp 97.1°F | Ht 66.0 in | Wt 187.2 lb

## 2015-04-14 DIAGNOSIS — R609 Edema, unspecified: Secondary | ICD-10-CM | POA: Diagnosis not present

## 2015-04-14 DIAGNOSIS — R600 Localized edema: Secondary | ICD-10-CM | POA: Diagnosis not present

## 2015-04-14 MED ORDER — FUROSEMIDE 20 MG PO TABS: 20.0000 mg | ORAL_TABLET | Freq: Every day | ORAL | Status: DC

## 2015-04-14 NOTE — Progress Notes (Signed)
Subjective:     Patient ID: [name], male   DOB: [birth year], 81 y.o.   MRN: [id number]  HPI Pt here due to bilat lower ext edema  Sx have continued since a recent hospital admission for pneumonia He has been doing well since discharge except for the edema Sl pain due to the edema He was on Lasix after admission but has run out They stopped his Norvasc and HCTZ After discussion with the pt he has been restricting his salt intake but drinking lots of Gatorade  Review of Systems  Constitutional: Negative.   HENT: Negative.   Respiratory: Negative.   Cardiovascular: Positive for leg swelling. Negative for chest pain and palpitations.       Objective:   Physical Exam  Constitutional: He appears well-developed and well-nourished.  HENT:  Mouth/Throat: Oropharynx is clear and moist.  Neck: Neck supple. No JVD present.  Cardiovascular: Normal rate and regular rhythm.   Murmur heard. Heart RRR today with sys murmur 2/6  Musculoskeletal: He exhibits edema.  + Lower ext edema mid tib down R>L No skin breakdown noted  Lymphadenopathy:    He has no cervical adenopathy.  Nursing note and vitals reviewed.      Assessment:     Lower ext edema    Plan:     Will restart his Lasix Discussed contents of Gatorade He will stop this and use water only Compression sock Keep appt with Card on 7/6

## 2015-04-14 NOTE — Patient Instructions (Signed)

## 2015-04-19 ENCOUNTER — Ambulatory Visit (INDEPENDENT_AMBULATORY_CARE_PROVIDER_SITE_OTHER): Payer: Medicare Other | Admitting: Cardiology

## 2015-04-19 ENCOUNTER — Encounter: Payer: Self-pay | Admitting: Cardiology

## 2015-04-19 VITALS — BP 130/65 | HR 63 | Ht 66.0 in | Wt 188.0 lb

## 2015-04-19 DIAGNOSIS — I6523 Occlusion and stenosis of bilateral carotid arteries: Secondary | ICD-10-CM | POA: Diagnosis not present

## 2015-04-19 DIAGNOSIS — H6120 Impacted cerumen, unspecified ear: Secondary | ICD-10-CM | POA: Diagnosis not present

## 2015-04-19 DIAGNOSIS — M7989 Other specified soft tissue disorders: Secondary | ICD-10-CM

## 2015-04-19 NOTE — Progress Notes (Signed)
HPI The patient presents for one-year followup of his known coronary disease.  He was in the hospital last month and a review these records. He had a pneumonia. He was hypotensive at that time and his Norvasc was stopped. Since going home he's had some lower externally swelling has been on diaphoretic without much improvement. This is in his right greater than his left leg. He's not having any new shortness of breath, PND or orthopnea. He's not having any new chest pressure, neck or arm discomfort. He's had no weight gain.   Of note last year he was having some neuropathic pain is of his amiodarone was stopped. He actually seems to be doing better with less of that off that medication so it may well have been related. He's not had any recurrence of his previous atrial fibrillation.    Allergies  Allergen Reactions  . Amiodarone     Neuropathy  . Penicillins Itching    Current Outpatient Prescriptions  Medication Sig Dispense Refill  . CALCIUM PO Take 1 tablet by mouth daily.    . carvedilol (COREG) 12.5 MG tablet TAKE (1) TABLET TWICE A DAY. 60 tablet 4  . fenofibrate micronized (LOFIBRA) 134 MG capsule Take 1 capsule (134 mg total) by mouth daily before breakfast. 90 capsule 1  . fish oil-omega-3 fatty acids 1000 MG capsule Take 2 g by mouth daily.    . furosemide (LASIX) 20 MG tablet Take 1 tablet (20 mg total) by mouth daily. 30 tablet 3  . gabapentin (NEURONTIN) 600 MG tablet Take 1 tablet (600 mg total) by mouth 3 (three) times daily. 60 tablet 5  . levothyroxine (SYNTHROID, LEVOTHROID) 150 MCG tablet TAKE 1 TABLET DAILY 30 tablet 9  . lisinopril (PRINIVIL,ZESTRIL) 40 MG tablet TAKE 1 TABLET ONCE A DAY FOR HIGH BLOOD PRESSURE 30 tablet 4  . pantoprazole (PROTONIX) 40 MG tablet Take 1 tablet (40 mg total) by mouth daily at 6 (six) AM. 30 tablet 2  . tamsulosin (FLOMAX) 0.4 MG CAPS capsule Take 1 capsule (0.4 mg total) by mouth daily. 30 capsule 5  . warfarin (COUMADIN) 5 MG tablet  Take 5-7.5 mg by mouth daily. Take 5 mg once daily except on Monday and Friday take 7.5 mg.    . predniSONE (DELTASONE) 10 MG tablet      No current facility-administered medications for this visit.    Past Medical History  Diagnosis Date  . Dilated cardiomyopathy     Felt to be secondary to atrial fibrillation. EF is about 40%  . Atrial fibrillation     Persistent, treated with amiodarone and cardioversion  . Drug therapy     Chronic Coumadin therapy  . CAD (coronary artery disease)     Last catheterization in August 2008 demonstrated the LAD 40% stenosis  . Hypertension   . Dyslipidemia   . Hypothyroidism     Possibly related to amiodarone  . COPD (chronic obstructive pulmonary disease)     Past Surgical History  Procedure Laterality Date  . Hip surgery Left     8yo  . Cardiac catheterization  05/2007    LAD 40% stenosis. Stent in the LAD had 20% in-stent restenosis. Followed by 40-50% distal stenosis. 50% diagonal stenosis. 40-50% circumflex stenosis. 70% OM1 stenosis and 30% ostial RCA stenosis. 30% proximal RCA stenosis.  . Coronary stent placement      ROS:  Neuropathy.  Otherwise as stated in the HPI and negative for all other systems.  PHYSICAL EXAM BP  130/65 mmHg  Pulse 63  Ht 5\' 6"  (1.676 m)  Wt 188 lb (85.276 kg)  BMI 30.36 kg/m2 GENERAL:  Well appearing NECK:  No jugular venous distention, waveform within normal limits, carotid upstroke brisk and symmetric, soft right bruits, no thyromegaly LUNGS:  Clear to auscultation bilaterally CHEST:  Unremarkable HEART:  PMI not displaced or sustained,S1 and S2 within normal limits, no S3, no S4, no clicks, no rubs, apical early peaking systolic murmur radiating out the aortic outflow tract slightly ABD:  Flat, positive bowel sounds normal in frequency in pitch, no bruits, no rebound, no guarding, no midline pulsatile mass, no hepatomegaly, no splenomegaly EXT:  2 plus pulses upper and decreased DP/PT bilateral with  right greater than left edema, no cyanosis no clubbing SKIN:  No rashes no nodules, multiple bruises.    EKG:  Sinus bradycardia, left bundle branch, rate 90.  03/26/15  ASSESSMENT AND PLAN  ATRIAL FIBRILLATION:  Has been off of amiodarone because it might cause neuropathy. I added this is an intolerance. He's not had any new paroxysms. He tolerates anticoagulation. No change in therapy is indicated.  CAD:   He has had no new symptoms since the [facility]. No further testing is indicated.   HTN:  His blood pressure is at target. No change in therapy is planned.  HYPERLIPIDEMIA:   His last LDL was 75. He did have hypertriglyceridemia. This is followed by Thomasene Mohair, PA-C  CAROTID STENOSIS:  Carotid Doppler demonstrated stable 40-59% bilateral stenosis.  I will arrange follow-up.  LEG SWELLING:  He should not have a DVT with his warfarin but I will check a venous Doppler.  Hospital records reviewed.

## 2015-04-19 NOTE — Patient Instructions (Addendum)
Medication Instructions:  Your physician recommends that you continue on your current medications as directed. Please refer to the Current Medication list given to you today.  Testing/Procedures: Your physician has requested that you have a lower extremity venous duplex. This test is an ultrasound of the veins in the legs. It looks at venous blood flow that carries blood from the heart to the legs. Allow one hour for a Lower Venous exam. There are no restrictions or special instructions.  Your physician has requested that you have a carotid duplex. This test is an ultrasound of the carotid arteries in your neck. It looks at blood flow through these arteries that supply the brain with blood. Allow one hour for this exam. There are no restrictions or special instructions.  Follow-Up: Follow up in 1 year with Dr. Percival Spanish in [place].  You will receive a letter in the mail 2 months before you are due.  Please call us when you receive this letter to schedule your follow up appointment.  Thank you for choosing [facility]!!

## 2015-04-20 ENCOUNTER — Ambulatory Visit (HOSPITAL_COMMUNITY)
Admission: RE | Admit: 2015-04-20 | Discharge: 2015-04-20 | Disposition: A | Discharge status: Home / Self Care | Payer: Medicare Other | Source: Ambulatory Visit | Attending: Cardiology | Admitting: Cardiology

## 2015-04-20 DIAGNOSIS — M7989 Other specified soft tissue disorders: Secondary | ICD-10-CM | POA: Diagnosis not present

## 2015-04-20 DIAGNOSIS — I6523 Occlusion and stenosis of bilateral carotid arteries: Secondary | ICD-10-CM | POA: Insufficient documentation

## 2015-05-03 ENCOUNTER — Telehealth: Payer: Self-pay | Admitting: Cardiology

## 2015-05-03 NOTE — Telephone Encounter (Signed)
Left message for pt at his home number that he just had the carotid doppler and it should be repeated in 1 yr.  I had already reviewed this information with the pt previously. Requested he call back if further questions or concerns.

## 2015-05-03 NOTE — Telephone Encounter (Signed)
New Message  Pt requests a call back to discuss if and why the Carotid is needed// Please call

## 2015-05-15 ENCOUNTER — Other Ambulatory Visit: Payer: Self-pay | Admitting: Cardiology

## 2015-05-15 ENCOUNTER — Ambulatory Visit (INDEPENDENT_AMBULATORY_CARE_PROVIDER_SITE_OTHER): Payer: Medicare Other | Admitting: Pharmacist

## 2015-05-15 DIAGNOSIS — I480 Paroxysmal atrial fibrillation: Secondary | ICD-10-CM

## 2015-05-15 DIAGNOSIS — I482 Chronic atrial fibrillation, unspecified: Secondary | ICD-10-CM

## 2015-05-15 DIAGNOSIS — M25552 Pain in left hip: Secondary | ICD-10-CM | POA: Diagnosis not present

## 2015-05-15 LAB — POCT INR: INR: 1.9

## 2015-05-15 NOTE — Patient Instructions (Signed)
Anticoagulation Dose Instructions as of 05/15/2015      [name] Tue Wed Thu Fri Sat   New Dose 5 mg 7.5 mg 5 mg 7.5 mg 5 mg 7.5 mg 5 mg    Description        Take an extra 1/2 tablet tomorrow - Tuesday, August 2nd.  Then restart usual  warfarin dose of 1 tablet daily except 1 and 1/2 on mondays, wednesdays and fridays.       INR was 1.9 today

## 2015-05-15 NOTE — Telephone Encounter (Signed)
REFILL 

## 2015-05-15 NOTE — Progress Notes (Signed)
Patient c/o of left hip and leg pain.  He has steroid injection in elbow about 1 month ago and this helped.  He wonders if he could get injection in hip. Referral sent to orthopedist to evaluate and treat as needed.

## 2015-05-17 ENCOUNTER — Telehealth: Payer: Self-pay | Admitting: Pharmacist

## 2015-05-17 DIAGNOSIS — M25572 Pain in left ankle and joints of left foot: Secondary | ICD-10-CM | POA: Diagnosis not present

## 2015-05-17 NOTE — Telephone Encounter (Signed)
Patient wants to see ortho today.  I explained that Dr Alusio's office is trying to get him an appt ASAP but would most likely be next week at soonest.

## 2015-05-24 DIAGNOSIS — M7702 Medial epicondylitis, left elbow: Secondary | ICD-10-CM | POA: Diagnosis not present

## 2015-06-09 ENCOUNTER — Ambulatory Visit (INDEPENDENT_AMBULATORY_CARE_PROVIDER_SITE_OTHER): Payer: Medicare Other | Admitting: Family Medicine

## 2015-06-09 ENCOUNTER — Encounter: Payer: Self-pay | Admitting: Family Medicine

## 2015-06-09 VITALS — BP 141/54 | HR 60 | Temp 97.2°F | Ht 66.0 in | Wt 185.0 lb

## 2015-06-09 DIAGNOSIS — I482 Chronic atrial fibrillation, unspecified: Secondary | ICD-10-CM

## 2015-06-09 DIAGNOSIS — I10 Essential (primary) hypertension: Secondary | ICD-10-CM | POA: Diagnosis not present

## 2015-06-09 DIAGNOSIS — G609 Hereditary and idiopathic neuropathy, unspecified: Secondary | ICD-10-CM

## 2015-06-09 LAB — POCT INR: INR: 2.2

## 2015-06-09 MED ORDER — GABAPENTIN 600 MG PO TABS: 1200.0000 mg | ORAL_TABLET | Freq: Two times a day (BID) | ORAL | Status: DC

## 2015-06-09 MED ORDER — TRAMADOL HCL 50 MG PO TABS: 50.0000 mg | ORAL_TABLET | Freq: Four times a day (QID) | ORAL | Status: DC | PRN

## 2015-06-09 MED ORDER — TAMSULOSIN HCL 0.4 MG PO CAPS: 0.8000 mg | ORAL_CAPSULE | Freq: Every day | ORAL | Status: DC

## 2015-06-09 NOTE — Progress Notes (Signed)
Subjective:  Patient ID: [name], male    DOB: 1926-08-02  Age: 81 y.o. MRN: [id number]  CC: Atrial Fibrillation and Peripheral Neuropathy   HPI [name] presents for Burning and stinging in legs. This is based on his history of peripheral neuropathy. It seems that the gabapentin has not been working as well so he has backed off on it somewhat. He takes it less often than he used to but still daily 2 or 3 times.   follow-up of hypertension. Patient has no history of headache chest pain or shortness of breath or recent cough. Patient also denies symptoms of TIA such as numbness weakness lateralizing. Patient checks  blood pressure at home and has not had any elevated readings recently. Patient denies side effects from his medication. States taking it regularly.    Patient in for follow-up of atrial fibrillation. Patient denies any recent bouts of chest pain or palpitations. Additionally, patient is taking anticoagulants. Patient denies any recent excessive bleeding episodes including epistaxis, bleeding from the gums, genitalia, rectal bleeding or hematuria. Additionally there has been no excessive bruising.  History [name] has a past medical history of Dilated cardiomyopathy; Atrial fibrillation; Drug therapy; CAD (coronary artery disease); Hypertension; Dyslipidemia; Hypothyroidism; and COPD (chronic obstructive pulmonary disease).   He has past surgical history that includes Hip surgery (Left); Cardiac catheterization (05/2007); and Coronary stent placement.   His family history includes Diabetes in his other; Heart attack in his father and mother; Stroke in his other. He was adopted.He reports that he has never smoked. He has never used smokeless tobacco. He reports that he does not drink alcohol or use illicit drugs.  Outpatient Prescriptions Prior to Visit  Medication Sig Dispense Refill  . amLODipine (NORVASC) 10 MG tablet TAKE 1 TABLET ONCE A DAY 30 tablet 11  . CALCIUM PO  Take 1 tablet by mouth daily.    . carvedilol (COREG) 12.5 MG tablet TAKE (1) TABLET TWICE A DAY. 60 tablet 4  . fenofibrate micronized (LOFIBRA) 134 MG capsule Take 1 capsule (134 mg total) by mouth daily before breakfast. 90 capsule 1  . fish oil-omega-3 fatty acids 1000 MG capsule Take 2 g by mouth daily.    . furosemide (LASIX) 20 MG tablet Take 1 tablet (20 mg total) by mouth daily. 30 tablet 3  . levothyroxine (SYNTHROID, LEVOTHROID) 150 MCG tablet TAKE 1 TABLET DAILY 30 tablet 9  . lisinopril (PRINIVIL,ZESTRIL) 40 MG tablet TAKE 1 TABLET ONCE A DAY FOR HIGH BLOOD PRESSURE 30 tablet 4  . pantoprazole (PROTONIX) 40 MG tablet Take 1 tablet (40 mg total) by mouth daily at 6 (six) AM. 30 tablet 2  . warfarin (COUMADIN) 5 MG tablet Take 5-7.5 mg by mouth daily. Take 5 mg once daily except on Monday and Friday take 7.5 mg.    . gabapentin (NEURONTIN) 600 MG tablet Take 1 tablet (600 mg total) by mouth 3 (three) times daily. 60 tablet 5  . tamsulosin (FLOMAX) 0.4 MG CAPS capsule Take 1 capsule (0.4 mg total) by mouth daily. 30 capsule 5   No facility-administered medications prior to visit.    ROS Review of Systems  Constitutional: Negative for fever, chills and diaphoresis.  HENT: Negative for congestion, rhinorrhea and sore throat.   Respiratory: Negative for cough, shortness of breath and wheezing.   Cardiovascular: Negative for chest pain.  Gastrointestinal: Negative for nausea, vomiting, abdominal pain, diarrhea, constipation and abdominal distention.  Genitourinary: Negative for dysuria and frequency.  Musculoskeletal: Positive for  back pain. Negative for joint swelling, arthralgias and neck pain.  Skin: Negative for rash.  Neurological: Positive for weakness. Negative for headaches.       See history of present illness. He has neuralgia of the left lower extremity primarily.    Objective:  BP 141/54 mmHg  Pulse 60  Temp(Src) 97.2 F (36.2 C) (Oral)  Ht $R'5\' 6"'yM$  (1.676 m)  Wt 185  lb (83.915 kg)  BMI 29.87 kg/m2  BP Readings from Last 3 Encounters:  06/09/15 141/54  04/19/15 130/65  04/14/15 145/63    Wt Readings from Last 3 Encounters:  06/09/15 185 lb (83.915 kg)  04/19/15 188 lb (85.276 kg)  04/14/15 187 lb 3.2 oz (84.913 kg)     Physical Exam  Constitutional: He is oriented to person, place, and time. He appears well-developed and well-nourished. No distress.  HENT:  Head: Normocephalic and atraumatic.  Right Ear: External ear normal.  Left Ear: External ear normal.  Nose: Nose normal.  Mouth/Throat: Oropharynx is clear and moist.  Eyes: Conjunctivae and EOM are normal. Pupils are equal, round, and reactive to light.  Neck: Normal range of motion. Neck supple. No thyromegaly present.  Cardiovascular: Normal rate, regular rhythm and normal heart sounds.   No murmur heard. Pulmonary/Chest: Effort normal and breath sounds normal. No respiratory distress. He has no wheezes. He has no rales.  Abdominal: Soft. Bowel sounds are normal. He exhibits no distension. There is no tenderness.  Lymphadenopathy:    He has no cervical adenopathy.  Neurological: He is alert and oriented to person, place, and time. He has normal reflexes.  Skin: Skin is warm and dry.  Psychiatric: He has a normal mood and affect. His behavior is normal. Judgment and thought content normal.    Lab Results  Component Value Date   HGBA1C 6.0% 11/14/2014   HGBA1C 6.0 11/14/2014    Lab Results  Component Value Date   WBC 11.3* 03/26/2015   HGB 12.6* 03/26/2015   HCT 38.1* 03/26/2015   PLT 145* 03/26/2015   GLUCOSE 126* 06/09/2015   CHOL 166 08/29/2014   TRIG 318* 08/29/2014   HDL 27* 08/29/2014   LDLCALC 75 08/29/2014   ALT 14 06/09/2015   AST 16 06/09/2015   NA 140 06/09/2015   K 4.5 06/09/2015   CL 100 06/09/2015   CREATININE 1.33* 06/09/2015   BUN 30* 06/09/2015   CO2 24 06/09/2015   TSH 0.382 03/26/2015   INR 2.2 06/09/2015   HGBA1C 6.0% 11/14/2014    US  Carotid Duplex Bilateral  04/20/2015   CLINICAL DATA:  Bilateral carotid disease .  EXAM: BILATERAL CAROTID DUPLEX ULTRASOUND  TECHNIQUE: Pearline Cables scale imaging, color Doppler and duplex ultrasound were performed of bilateral carotid and vertebral arteries in the neck.  COMPARISON:  None.  FINDINGS: Criteria: Quantification of carotid stenosis is based on velocity parameters that correlate the residual internal carotid diameter with NASCET-based stenosis levels, using the diameter of the distal internal carotid lumen as the denominator for stenosis measurement.  The following velocity measurements were obtained:  RIGHT  ICA:  126/17 cm/sec  CCA:  56/2 cm/sec  SYSTOLIC ICA/CCA RATIO:  2.1  DIASTOLIC ICA/CCA RATIO:  2.3  ECA:  218 cm/sec  LEFT  ICA:  140/22 cm/sec  CCA:  56/38 cm/sec  SYSTOLIC ICA/CCA RATIO:  1.8  DIASTOLIC ICA/CCA RATIO:  1.9  ECA:  188 cm/sec  RIGHT CAROTID ARTERY: Moderate calcified plaque right carotid bifurcation proximal internal carotid artery.  RIGHT VERTEBRAL ARTERY:  Patent with antegrade flow.  LEFT CAROTID ARTERY: Moderate calcified plaque left carotid bifurcation and proximal internal carotid artery.  LEFT VERTEBRAL ARTERY:  Patent with antegrade flow.  IMPRESSION: 1. Moderate bilateral carotid bifurcation disease. Degree of stenosis 50-69% bilaterally. 2. Vertebral arteries are patent with antegrade flow.   Electronically Signed   By: Maisie Fus  Register   On: 04/20/2015 13:04   US Venous Img Lower Unilateral Right  04/20/2015   CLINICAL DATA:  Right lower extremity swelling.  EXAM: RIGHT LOWER EXTREMITY VENOUS DOPPLER ULTRASOUND  TECHNIQUE: Gray-scale sonography with graded compression, as well as color Doppler and duplex ultrasound were performed to evaluate the lower extremity deep venous systems from the level of the common femoral vein and including the common femoral, femoral, profunda femoral, popliteal and calf veins including the posterior tibial, peroneal and gastrocnemius veins  when visible. The superficial great saphenous vein was also interrogated. Spectral Doppler was utilized to evaluate flow at rest and with distal augmentation maneuvers in the common femoral, femoral and popliteal veins.  COMPARISON:  None.  FINDINGS: Contralateral Common Femoral Vein: Respiratory phasicity is normal and symmetric with the symptomatic side. No evidence of thrombus. Normal compressibility.  Common Femoral Vein: No evidence of thrombus. Normal compressibility, respiratory phasicity and response to augmentation.  Saphenofemoral Junction: No evidence of thrombus. Normal compressibility and flow on color Doppler imaging.  Profunda Femoral Vein: No evidence of thrombus. Normal compressibility and flow on color Doppler imaging.  Femoral Vein: No evidence of thrombus. Normal compressibility, respiratory phasicity and response to augmentation.  Popliteal Vein: No evidence of thrombus. Normal compressibility, respiratory phasicity and response to augmentation.  Calf Veins: No evidence of thrombus. Normal compressibility and flow on color Doppler imaging.  Superficial Great Saphenous Vein: No evidence of thrombus. Normal compressibility and flow on color Doppler imaging.  Venous Reflux:  None.  Other Findings:  None.  IMPRESSION: No evidence of deep venous thrombosis.   Electronically Signed   By: Maisie Fus  Register   On: 04/20/2015 13:05    Assessment & Plan:   Oluwademilade was seen today for atrial fibrillation and peripheral neuropathy.  Diagnoses and all orders for this visit:  Essential hypertension -     CMP14+EGFR  Chronic atrial fibrillation -     POCT INR  Hereditary and idiopathic peripheral neuropathy -     Discontinue: gabapentin (NEURONTIN) 600 MG tablet; Take 2 tablets (1,200 mg total) by mouth 2 (two) times daily. -     gabapentin (NEURONTIN) 600 MG tablet; Take 2 tablets (1,200 mg total) by mouth 2 (two) times daily. For burning in legs  Other orders -     tamsulosin (FLOMAX) 0.4 MG  CAPS capsule; Take 2 capsules (0.8 mg total) by mouth daily after supper. To decrease nighttime bathroom trips -     traMADol (ULTRAM) 50 MG tablet; Take 1 tablet (50 mg total) by mouth every 6 (six) hours as needed for moderate pain or severe pain.   I have discontinued Mr. Maisano gabapentin. I have also changed his tamsulosin, gabapentin, and traMADol. Additionally, I am having him maintain his fish oil-omega-3 fatty acids, fenofibrate micronized, levothyroxine, CALCIUM PO, warfarin, carvedilol, pantoprazole, lisinopril, furosemide, and amLODipine.  Meds ordered this encounter  Medications  . DISCONTD: traMADol (ULTRAM) 50 MG tablet    Sig:   . tamsulosin (FLOMAX) 0.4 MG CAPS capsule    Sig: Take 2 capsules (0.8 mg total) by mouth daily after supper. To decrease nighttime bathroom trips    Dispense:  60 capsule    Refill:  5  . DISCONTD: gabapentin (NEURONTIN) 600 MG tablet    Sig: Take 2 tablets (1,200 mg total) by mouth 2 (two) times daily.    Dispense:  120 tablet    Refill:  5  . gabapentin (NEURONTIN) 600 MG tablet    Sig: Take 2 tablets (1,200 mg total) by mouth 2 (two) times daily. For burning in legs    Dispense:  120 tablet    Refill:  5  . traMADol (ULTRAM) 50 MG tablet    Sig: Take 1 tablet (50 mg total) by mouth every 6 (six) hours as needed for moderate pain or severe pain.    Dispense:  50 tablet    Refill:  5     Follow-up: Return in about 3 months (around 09/09/2015).  Claretta Fraise, M.D.

## 2015-06-10 LAB — CMP14+EGFR
A/G RATIO: 1.7 (ref 1.1–2.5)
ALT: 14 IU/L (ref 0–44)
AST: 16 IU/L (ref 0–40)
Albumin: 4.5 g/dL (ref 3.5–4.7)
Alkaline Phosphatase: 41 IU/L (ref 39–117)
BILIRUBIN TOTAL: 0.3 mg/dL (ref 0.0–1.2)
BUN/Creatinine Ratio: 23 — ABNORMAL HIGH (ref 10–22)
BUN: 30 mg/dL — AB (ref 8–27)
CHLORIDE: 100 mmol/L (ref 97–108)
CO2: 24 mmol/L (ref 18–29)
Calcium: 9.5 mg/dL (ref 8.6–10.2)
Creatinine, Ser: 1.33 mg/dL — ABNORMAL HIGH (ref 0.76–1.27)
GFR calc Af Amer: 55 mL/min/{1.73_m2} — ABNORMAL LOW (ref >59)
GFR calc non Af Amer: 48 mL/min/{1.73_m2} — ABNORMAL LOW (ref >59)
GLUCOSE: 126 mg/dL — AB (ref 65–99)
Globulin, Total: 2.7 g/dL (ref 1.5–4.5)
POTASSIUM: 4.5 mmol/L (ref 3.5–5.2)
Sodium: 140 mmol/L (ref 134–144)
Total Protein: 7.2 g/dL (ref 6.0–8.5)

## 2015-06-15 ENCOUNTER — Other Ambulatory Visit: Payer: Self-pay

## 2015-06-15 MED ORDER — WARFARIN SODIUM 5 MG PO TABS: 5.0000 mg | ORAL_TABLET | Freq: Every day | ORAL | Status: DC

## 2015-07-07 ENCOUNTER — Other Ambulatory Visit: Payer: Self-pay | Admitting: *Deleted

## 2015-07-07 MED ORDER — PANTOPRAZOLE SODIUM 40 MG PO TBEC: 40.0000 mg | DELAYED_RELEASE_TABLET | Freq: Every day | ORAL | Status: DC

## 2015-07-07 NOTE — Telephone Encounter (Signed)
Refill for protonix sent to [facility].

## 2015-07-10 ENCOUNTER — Other Ambulatory Visit: Payer: Self-pay | Admitting: Pharmacist

## 2015-07-11 NOTE — Telephone Encounter (Signed)
Last seen 06/09/15 Dr Livia Snellen  Last lipid 08/29/14

## 2015-07-14 ENCOUNTER — Encounter: Payer: Self-pay | Admitting: Pharmacist

## 2015-07-14 ENCOUNTER — Ambulatory Visit (INDEPENDENT_AMBULATORY_CARE_PROVIDER_SITE_OTHER): Payer: Medicare Other | Admitting: Pharmacist

## 2015-07-14 VITALS — BP 138/60 | HR 64 | Ht 65.5 in | Wt 183.0 lb

## 2015-07-14 DIAGNOSIS — R7309 Other abnormal glucose: Secondary | ICD-10-CM | POA: Diagnosis not present

## 2015-07-14 DIAGNOSIS — R296 Repeated falls: Secondary | ICD-10-CM | POA: Diagnosis not present

## 2015-07-14 DIAGNOSIS — I482 Chronic atrial fibrillation, unspecified: Secondary | ICD-10-CM

## 2015-07-14 DIAGNOSIS — R739 Hyperglycemia, unspecified: Secondary | ICD-10-CM

## 2015-07-14 DIAGNOSIS — I480 Paroxysmal atrial fibrillation: Secondary | ICD-10-CM

## 2015-07-14 DIAGNOSIS — Z9181 History of falling: Secondary | ICD-10-CM

## 2015-07-14 DIAGNOSIS — Z79899 Other long term (current) drug therapy: Secondary | ICD-10-CM | POA: Diagnosis not present

## 2015-07-14 LAB — POCT INR: INR: 2.6

## 2015-07-14 LAB — POCT GLYCOSYLATED HEMOGLOBIN (HGB A1C): HEMOGLOBIN A1C: 6.1

## 2015-07-14 NOTE — Progress Notes (Signed)
Patient ID: [name], male   DOB: 1926-08-01, 81 y.o.   MRN: [id number]    Subjective:   [name] is a 81 y.o. male who presents for an Initial Medicare Annual Wellness Visit however upon further review patient has AWV in march 2016.  He also is here to recheck protime and would like to review his medications to ensure that he is taking at the appropriate times.      Current Medications (verified) Outpatient Encounter Prescriptions as of 07/14/2015  Medication Sig  . amLODipine (NORVASC) 10 MG tablet TAKE 1 TABLET ONCE A DAY  . CALCIUM PO Take 1 tablet by mouth daily.  . carvedilol (COREG) 12.5 MG tablet TAKE (1) TABLET TWICE A DAY.  . fenofibrate micronized (LOFIBRA) 134 MG capsule TAKE (1) CAPSULE DAILY BEFORE BREAKFAST.  . fish oil-omega-3 fatty acids 1000 MG capsule Take 2 g by mouth daily.  . furosemide (LASIX) 20 MG tablet Take 1 tablet (20 mg total) by mouth daily.  Marland Kitchen gabapentin (NEURONTIN) 600 MG tablet Take 2 tablets (1,200 mg total) by mouth 2 (two) times daily. For burning in legs  . levothyroxine (SYNTHROID, LEVOTHROID) 150 MCG tablet TAKE 1 TABLET DAILY  . lisinopril (PRINIVIL,ZESTRIL) 40 MG tablet TAKE 1 TABLET ONCE A DAY FOR HIGH BLOOD PRESSURE  . tamsulosin (FLOMAX) 0.4 MG CAPS capsule Take 2 capsules (0.8 mg total) by mouth daily after supper. To decrease nighttime bathroom trips  . traMADol (ULTRAM) 50 MG tablet Take 1 tablet (50 mg total) by mouth every 6 (six) hours as needed for moderate pain or severe pain.  Marland Kitchen warfarin (COUMADIN) 5 MG tablet Take 1-1.5 tablets (5-7.5 mg total) by mouth daily. Take as directed by anticoagulation clinic  . [DISCONTINUED] pantoprazole (PROTONIX) 40 MG tablet Take 1 tablet (40 mg total) by mouth daily at 6 (six) AM.  . [DISCONTINUED] warfarin (COUMADIN) 5 MG tablet Take 1-1.5 tablets (5-7.5 mg total) by mouth daily. Take 5 mg once daily except on Monday and Friday take 7.5 mg.   No facility-administered encounter medications on file  as of 07/14/2015.    Allergies (verified) Amiodarone and Penicillins   History: Past Medical History  Diagnosis Date  . Dilated cardiomyopathy     Felt to be secondary to atrial fibrillation. EF is about 40%  . Atrial fibrillation     Persistent, treated with amiodarone and cardioversion  . Drug therapy     Chronic Coumadin therapy  . CAD (coronary artery disease)     Last catheterization in August 2008 demonstrated the LAD 40% stenosis  . Hypertension   . Dyslipidemia   . Hypothyroidism     Possibly related to amiodarone  . COPD (chronic obstructive pulmonary disease)    Past Surgical History  Procedure Laterality Date  . Hip surgery Left     8yo  . Cardiac catheterization  05/2007    LAD 40% stenosis. Stent in the LAD had 20% in-stent restenosis. Followed by 40-50% distal stenosis. 50% diagonal stenosis. 40-50% circumflex stenosis. 70% OM1 stenosis and 30% ostial RCA stenosis. 30% proximal RCA stenosis.  . Coronary stent placement     Family History  Problem Relation Age of Onset  . Adopted: Yes  . Heart attack Mother   . Heart attack Father   . Diabetes Other   . Stroke Other    Social History   Occupational History  . Part Time    Social History Main Topics  . Smoking status: Never Smoker   . Smokeless  tobacco: Never Used  . Alcohol Use: No     Comment: quit at age 51yo  . Drug Use: No  . Sexual Activity: No     Objective:    Today's Vitals   07/14/15 1603  BP: 138/60  Pulse: 64  Height: 5' 5.5" (1.664 m)  Weight: 183 lb (83.008 kg)   Body mass index is 29.98 kg/(m^2).  INR was 2.6 in office today  A1c = 6.1% today FBG was 126 (08.26.2016)  Activities of Daily Living In your present state of health, do you have any difficulty performing the following activities: 07/14/2015 03/26/2015  Hearing? N N  Vision? N Y  Difficulty concentrating or making decisions? N N  Walking or climbing stairs? Y N  Dressing or bathing? N Y  Doing errands,  shopping? N N  Preparing Food and eating ? N -  Using the Toilet? N -  In the past six months, have you accidently leaked urine? N -  Do you have problems with loss of bowel control? N -  Managing your Medications? N -  Managing your Finances? N -  Housekeeping or managing your Housekeeping? N -      Depression Screen PHQ 2/9 Scores 07/14/2015 06/09/2015 03/06/2015 12/23/2014  PHQ - 2 Score 2 1 0 1  PHQ- 9 Score 3 - - -    Fall Risk Fall Risk  07/14/2015 06/09/2015 03/06/2015 12/23/2014 12/02/2014  Falls in the past year? Yes No No Yes Yes  Number falls in past yr: 1 - - 1 1  Injury with Fall? Yes - - No No  Risk Factor Category  High Fall Risk - - - -  Risk for fall due to : Impaired balance/gait;History of fall(s) - - Other (Comment) -  Risk for fall due to (comments): - - - neuropathy and pain in lower extremities -  Follow up Falls evaluation completed;Falls prevention discussed - - Falls evaluation completed;Falls prevention discussed -    Cognitive Function: MMSE - Mini Mental State Exam 12/23/2014  Orientation to time 5  Orientation to Place 5  Registration 3  Attention/ Calculation 3  Recall 3  Language- name 2 objects 2  Language- repeat 0  Language- follow 3 step command 3  Language- read & follow direction 1  Write a sentence 1  Copy design 1  Total score 27    Immunizations and Health Maintenance Immunization History  Administered Date(s) Administered  . Influenza,inj,Quad PF,36+ Mos 07/05/2013, 07/10/2015  . Influenza-Unspecified 07/21/2014  . Pneumococcal Conjugate-13 08/01/2014  . Pneumococcal Polysaccharide-23 08/17/1995  . Tdap 10/14/2008, 03/26/2015   There are no preventive care reminders to display for this patient.  Patient Care Team: Claretta Fraise, MD as PCP - General (Family Medicine) Jacelyn Pi, MD as Attending Physician (Endocrinology) Minus Breeding, MD as Consulting Physician (Cardiology) Druscilla Brownie, MD as Consulting Physician  (Dermatology)  Indicate any recent Medical Services you may have received from other than [facility] providers in the past year (date may be approximate).    Assessment:    Annual Wellness Visit  Pre Diabetes Medication Management - pantoprazole started in hospital 03/2015 but patient has not diagnosis of GERD or history of gastric ulcers.   Screening Tests Health Maintenance  Topic Date Due  . ZOSTAVAX  09/09/2015 (Originally 01/07/1988)  . INFLUENZA VACCINE  05/14/2016  . TETANUS/TDAP  03/25/2025  . PNA vac Low Risk Adult  Completed        Plan:   1.  Continue current dose  of warfarin per below Anticoagulation Dose Instructions as of 07/14/2015      Dorene Grebe Tue Wed Thu Fri Sat   New Dose 5 mg 7.5 mg 5 mg 7.5 mg 5 mg 7.5 mg 5 mg    Description        Continue current warfarin dose of 1 tablet daily except 1 and 1/2 on mondays, wednesdays and fridays.       2.  Patient advised to only take APAP as needed for pain. He is not to take any OTC NSAIDS.  3.  Discussed dietary changes below that will help to keep BG low and decrease triglycerides.    Increase non-starchy vegetables - carrots, green bean, squash, zucchini, tomatoes, onions, peppers, spinach and other green leafy vegetables, cabbage, lettuce, cucumbers, asparagus, okra (not fried), eggplant limit sugar and processed foods (cakes, cookies, ice cream, crackers and chips) Increase fresh fruit but limit serving sizes 1/2 cup or about the size of tennis or baseball limit red meat to no more than 1-2 times per week (serving size about the size of your palm) Choose whole grains / lean proteins - whole wheat bread, quinoa, whole grain rice (1/2 cup), fish, chicken, [place]   4.  Reviewed medications and recommended the following: Morning / Before Breakfast:  Levothyroxine (synthroid) 160mcg - take 1 tablet before breakfast 30 minutes prior to eating for thyroid  Morning / After Breakfast:  Amlodipine 10mg  (Norvasc) - take 1  tablet each morning for blood pressure Carvedilol 12.5mg  (Coreg) - take 1 tablet each morning and each evening for blood pressure Fenofibrate micrronized [name] - take 1 capsules each morning for elevated triglycerides Furosemide 20mg  (Lasix) take 1 tablet daily for fluid / swelling Gabapentin 600mg  - take 2 capsules in morning and 2 capsules in evening for nerve pain / burning in legs Lisinopril 40mg  - take 1 tablet once a day in the morning for blood pressure Fish Oil - take 2 capsules in the morning for elevated triglycerides  After Lunch: Multivitamin - take 1 tablet daily at lunch  Evening  Carvedilol 12.5mg  (Coreg) - take 1 tablet each morning and each evening for blood pressure Gabapentin 600mg  - take 2 capsules in morning and 2 capsules in evening for burning in legs tamsulosin (Flomax) 0.4mg  - take 2 capsules each evening to decrease nighttime bathroom trips Warfarin 5mg  - take each evening according to directions given by anticoagulation clinic (see above) - for thinning blood  *tramadol / Ultram - this is for moderate to severe pain- you should taking ONLY AS NEEDED WHEN YOU HAVE PAIN.  No more than 4 tablets per day**  Stop pantprazole - this was given in hospital and is used for reflux which you do not have. You can finish taking what you have on hand by taking every other day and then stop.  RTC in 1 months to recheck INR and Lipids  Patient Instructions (the written plan) were given to the patient.   Cherre Robins, PHARMD, CPP, CDE  07/14/2015   60 minutes spent with patient

## 2015-07-14 NOTE — Patient Instructions (Addendum)
Anticoagulation Dose Instructions as of 07/14/2015      [name] Tue Wed Thu Fri Sat   New Dose 5 mg 7.5 mg 5 mg 7.5 mg 5 mg 7.5 mg 5 mg    Description        Continue current warfarin dose of 1 tablet daily except 1 and 1/2 on mondays, wednesdays and fridays.       INR was 2.6 today            Morning / Before Breakfast:  Levothyroxine (synthroid) 190mcg - take 1 tablet before breakfast 30 minutes prior to eating for thyroid  Morning / After Breakfast:  Amlodipine 10mg  (Norvasc) - take 1 tablet each morning for blood pressure Carvedilol 12.5mg  (Coreg) - take 1 tablet each morning and each evening for blood pressure Fenofibrate micrronized [name] - take 1 capsules each morning for elevated triglycerides Furosemide 20mg  (Lasix) take 1 tablet daily for fluid / swelling Gabapentin 600mg  - take 2 capsules in morning and 2 capsules in evening for nerve pain / burning in legs Lisinopril 40mg  - take 1 tablet once a day in the morning for blood pressure Fish Oil - take 2 capsules in the morning for elevated triglycerides  After Lunch: Multivitamin - take 1 tablet daily at lunch  Evening  Carvedilol 12.5mg  (Coreg) - take 1 tablet each morning and each evening for blood pressure Gabapentin 600mg  - take 2 capsules in morning and 2 capsules in evening for burning in legs tamsulosin (Flomax) 0.4mg  - take 2 capsules each evening to decrease nighttime bathroom trips Warfarin 5mg  - take each evening according to directions given by anticoagulation clinic (see above) - for thinning blood  *tramadol / Ultram - this is for moderate to severe pain- you should taking ONLY AS NEEDED WHEN YOU HAVE PAIN.  No more than 4 tablets per day**  Stop pantprazole - this was given in hospital and is used for reflux which you do not have. You can finish taking what you have on hand by taking every other day and then stop.

## 2015-07-17 ENCOUNTER — Other Ambulatory Visit: Payer: Self-pay | Admitting: Cardiology

## 2015-07-18 ENCOUNTER — Other Ambulatory Visit: Payer: Self-pay | Admitting: Cardiology

## 2015-07-18 NOTE — Telephone Encounter (Signed)
Just wanted to clarify that the patient should still be off of this medication as the pharmacy is requesting a refill. Please advise. Thanks, MI

## 2015-07-18 NOTE — Telephone Encounter (Signed)
Per documentation from [facility] 04/19/15 "Of note last year he was having some neuropathic pain is of his amiodarone was stopped"

## 2015-07-20 DIAGNOSIS — L570 Actinic keratosis: Secondary | ICD-10-CM | POA: Diagnosis not present

## 2015-07-28 ENCOUNTER — Ambulatory Visit: Payer: Self-pay | Admitting: Family Medicine

## 2015-08-01 ENCOUNTER — Other Ambulatory Visit: Payer: Self-pay | Admitting: Family Medicine

## 2015-08-02 ENCOUNTER — Encounter: Payer: Self-pay | Admitting: Family Medicine

## 2015-08-02 ENCOUNTER — Ambulatory Visit (INDEPENDENT_AMBULATORY_CARE_PROVIDER_SITE_OTHER): Payer: Medicare Other | Admitting: Family Medicine

## 2015-08-02 VITALS — BP 105/53 | HR 59 | Temp 97.5°F | Ht 66.0 in | Wt 184.0 lb

## 2015-08-02 DIAGNOSIS — R351 Nocturia: Secondary | ICD-10-CM | POA: Diagnosis not present

## 2015-08-02 DIAGNOSIS — G609 Hereditary and idiopathic neuropathy, unspecified: Secondary | ICD-10-CM | POA: Diagnosis not present

## 2015-08-02 DIAGNOSIS — M1990 Unspecified osteoarthritis, unspecified site: Secondary | ICD-10-CM

## 2015-08-02 DIAGNOSIS — I10 Essential (primary) hypertension: Secondary | ICD-10-CM | POA: Diagnosis not present

## 2015-08-02 DIAGNOSIS — N400 Enlarged prostate without lower urinary tract symptoms: Secondary | ICD-10-CM | POA: Diagnosis not present

## 2015-08-02 DIAGNOSIS — N401 Enlarged prostate with lower urinary tract symptoms: Secondary | ICD-10-CM | POA: Insufficient documentation

## 2015-08-02 DIAGNOSIS — M1610 Unilateral primary osteoarthritis, unspecified hip: Secondary | ICD-10-CM

## 2015-08-02 LAB — POCT URINALYSIS DIPSTICK
Bilirubin, UA: NEGATIVE
Glucose, UA: NEGATIVE
Ketones, UA: NEGATIVE
Leukocytes, UA: NEGATIVE
Nitrite, UA: NEGATIVE
Protein, UA: NEGATIVE
Spec Grav, UA: 1.015
Urobilinogen, UA: NEGATIVE
pH, UA: 6.5

## 2015-08-02 LAB — POCT UA - MICROSCOPIC ONLY
Bacteria, U Microscopic: NEGATIVE
Casts, Ur, LPF, POC: NEGATIVE
Crystals, Ur, HPF, POC: NEGATIVE
Epithelial cells, urine per micros: NEGATIVE
Mucus, UA: NEGATIVE
WBC, Ur, HPF, POC: NEGATIVE
Yeast, UA: NEGATIVE

## 2015-08-02 MED ORDER — SILODOSIN 8 MG PO CAPS: 8.0000 mg | ORAL_CAPSULE | Freq: Every day | ORAL | Status: DC

## 2015-08-02 MED ORDER — DULOXETINE HCL 30 MG PO CPEP: 30.0000 mg | ORAL_CAPSULE | Freq: Every day | ORAL | Status: DC

## 2015-08-02 MED ORDER — BETAMETHASONE SOD PHOS & ACET 6 (3-3) MG/ML IJ SUSP
6.0000 mg | Freq: Once | INTRAMUSCULAR | Status: AC
  Administered 2015-08-02: 6 mg via INTRAMUSCULAR

## 2015-08-02 NOTE — Patient Instructions (Signed)
Discontinue the furosemide and the tamsulosin.

## 2015-08-02 NOTE — Progress Notes (Signed)
Subjective:  Patient ID: [name], male    DOB: Aug 26, 1926  Age: 81 y.o. MRN: [id number]  CC: Nocturia and Hip Pain   HPI Ferdinand Revoir presents for increasing pain in the left hip. 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 The pain is worsened by activity such as walking. However he does not want to just go it. He would like to stay active and would like to have medication to help him accomplish that. He also has a burning down the leg for which he takes gabapentin. This is a different pain. The paresthesia/neuropathic pain is under good control currently with his dose of gabapentin. He would like to have a shot in the hip. Patient also states that the urinating during the night his up to 3 or 4 times. He is urinating through the day every couple of hours. Sometimes every hour. This has not been improved at all since starting the tamsulosin.   follow-up of hypertension. Patient has no history of headache chest pain or shortness of breath or recent cough. Patient also denies symptoms of TIA such as numbness weakness lateralizing. Patient checks  blood pressure at home and has not had any elevated readings recently. Patient denies side effects from his medication. States taking it regularly.  History Remi has a past medical history of Dilated cardiomyopathy ([place]); Atrial fibrillation ([place]); Drug therapy; CAD (coronary artery disease); Hypertension; Dyslipidemia; Hypothyroidism; and COPD (chronic obstructive pulmonary disease) ([place]).   He has past surgical history that includes Hip surgery (Left); Cardiac catheterization (05/2007); and Coronary stent placement.   His family history includes Diabetes in his other; Heart attack in his father and mother; Stroke in his other. He was adopted.He reports that he has never smoked. He has never used smokeless tobacco. He reports that he does not drink alcohol or use illicit  drugs.  Outpatient Prescriptions Prior to Visit  Medication Sig Dispense Refill  . amLODipine (NORVASC) 10 MG tablet TAKE 1 TABLET ONCE A DAY 30 tablet 11  . CALCIUM PO Take 1 tablet by mouth daily.    . carvedilol (COREG) 12.5 MG tablet TAKE (1) TABLET TWICE A DAY. 60 tablet 4  . fenofibrate micronized (LOFIBRA) 134 MG capsule TAKE (1) CAPSULE DAILY BEFORE BREAKFAST. 90 capsule 0  . fish oil-omega-3 fatty acids 1000 MG capsule Take 2 g by mouth daily.    Marland Kitchen gabapentin (NEURONTIN) 600 MG tablet Take 2 tablets (1,200 mg total) by mouth 2 (two) times daily. For burning in legs 120 tablet 5  . levothyroxine (SYNTHROID, LEVOTHROID) 150 MCG tablet TAKE 1 TABLET DAILY 30 tablet 9  . lisinopril (PRINIVIL,ZESTRIL) 40 MG tablet TAKE 1 TABLET ONCE A DAY FOR HIGH BLOOD PRESSURE 30 tablet 3  . traMADol (ULTRAM) 50 MG tablet Take 1 tablet (50 mg total) by mouth every 6 (six) hours as needed for moderate pain or severe pain. 50 tablet 5  . warfarin (COUMADIN) 5 MG tablet Take 1-1.5 tablets (5-7.5 mg total) by mouth daily. Take as directed by anticoagulation clinic 32 tablet 2  . furosemide (LASIX) 20 MG tablet Take 1 tablet (20 mg total) by mouth daily. 30 tablet 3  . tamsulosin (FLOMAX) 0.4 MG CAPS capsule Take 2 capsules (0.8 mg total) by mouth daily after supper. To decrease nighttime bathroom trips 60 capsule 5   No facility-administered medications prior to visit.    ROS Review of Systems  Constitutional: Negative for  fever, chills and diaphoresis.  HENT: Negative for congestion, rhinorrhea and sore throat.   Respiratory: Negative for cough, shortness of breath and wheezing.   Cardiovascular: Negative for chest pain.  Gastrointestinal: Negative for nausea, vomiting, abdominal pain, diarrhea, constipation and abdominal distention.  Genitourinary: Positive for urgency and frequency. Negative for dysuria and testicular pain.  Musculoskeletal: Positive for myalgias and arthralgias. Negative for joint  swelling.  Skin: Negative for rash.  Neurological: Negative for headaches.  Psychiatric/Behavioral: Negative for behavioral problems and agitation. The patient is not nervous/anxious.     Objective:  BP 105/53 mmHg  Pulse 59  Temp(Src) 97.5 F (36.4 C) (Oral)  Ht 5\' 6"  (1.676 m)  Wt 184 lb (83.462 kg)  BMI 29.71 kg/m2  BP Readings from Last 3 Encounters:  08/02/15 105/53  07/14/15 138/60  06/09/15 141/54    Wt Readings from Last 3 Encounters:  08/02/15 184 lb (83.462 kg)  07/14/15 183 lb (83.008 kg)  06/09/15 185 lb (83.915 kg)     Physical Exam  Constitutional: He is oriented to person, place, and time. He appears well-developed and well-nourished. No distress.  HENT:  Head: Normocephalic and atraumatic.  Right Ear: External ear normal.  Left Ear: External ear normal.  Nose: Nose normal.  Mouth/Throat: Oropharynx is clear and moist.  Eyes: Conjunctivae and EOM are normal. Pupils are equal, round, and reactive to light.  Neck: Normal range of motion. Neck supple. No thyromegaly present.  Cardiovascular: Normal rate, regular rhythm and normal heart sounds.   No murmur heard. Pulmonary/Chest: Effort normal and breath sounds normal. No respiratory distress. He has no wheezes. He has no rales.  Abdominal: Soft. Bowel sounds are normal. He exhibits no distension. There is no tenderness.  Musculoskeletal: He exhibits tenderness (left trochanteric bursa).  Lymphadenopathy:    He has no cervical adenopathy.  Neurological: He is alert and oriented to person, place, and time. He has normal reflexes.  Skin: Skin is warm and dry.  Psychiatric: He has a normal mood and affect. His behavior is normal. Judgment and thought content normal.    Lab Results  Component Value Date   HGBA1C 6.1 07/14/2015   HGBA1C 6.0% 11/14/2014   HGBA1C 6.0 11/14/2014    Lab Results  Component Value Date   WBC 11.3* 03/26/2015   HGB 12.6* 03/26/2015   HCT 38.1* 03/26/2015   PLT 145*  03/26/2015   GLUCOSE 126* 06/09/2015   CHOL 166 08/29/2014   TRIG 318* 08/29/2014   HDL 27* 08/29/2014   LDLCALC 75 08/29/2014   ALT 14 06/09/2015   AST 16 06/09/2015   NA 140 06/09/2015   K 4.5 06/09/2015   CL 100 06/09/2015   CREATININE 1.33* 06/09/2015   BUN 30* 06/09/2015   CO2 24 06/09/2015   TSH 0.382 03/26/2015   INR 2.6 07/14/2015   HGBA1C 6.1 07/14/2015    US Carotid Duplex Bilateral  04/20/2015  CLINICAL DATA:  Bilateral carotid disease . EXAM: BILATERAL CAROTID DUPLEX ULTRASOUND TECHNIQUE: Pearline Cables scale imaging, color Doppler and duplex ultrasound were performed of bilateral carotid and vertebral arteries in the neck. COMPARISON:  None. FINDINGS: Criteria: Quantification of carotid stenosis is based on velocity parameters that correlate the residual internal carotid diameter with NASCET-based stenosis levels, using the diameter of the distal internal carotid lumen as the denominator for stenosis measurement. The following velocity measurements were obtained: RIGHT ICA:  126/17 cm/sec CCA:  01/7 cm/sec SYSTOLIC ICA/CCA RATIO:  2.1 DIASTOLIC ICA/CCA RATIO:  2.3 ECA:  218  cm/sec LEFT ICA:  140/22 cm/sec CCA:  03/55 cm/sec SYSTOLIC ICA/CCA RATIO:  1.8 DIASTOLIC ICA/CCA RATIO:  1.9 ECA:  188 cm/sec RIGHT CAROTID ARTERY: Moderate calcified plaque right carotid bifurcation proximal internal carotid artery. RIGHT VERTEBRAL ARTERY:  Patent with antegrade flow. LEFT CAROTID ARTERY: Moderate calcified plaque left carotid bifurcation and proximal internal carotid artery. LEFT VERTEBRAL ARTERY:  Patent with antegrade flow. IMPRESSION: 1. Moderate bilateral carotid bifurcation disease. Degree of stenosis 50-69% bilaterally. 2. Vertebral arteries are patent with antegrade flow. Electronically Signed   By: Marcello Moores  Register   On: 04/20/2015 13:04   US Venous Img Lower Unilateral Right  04/20/2015  CLINICAL DATA:  Right lower extremity swelling. EXAM: RIGHT LOWER EXTREMITY VENOUS DOPPLER ULTRASOUND  TECHNIQUE: Gray-scale sonography with graded compression, as well as color Doppler and duplex ultrasound were performed to evaluate the lower extremity deep venous systems from the level of the common femoral vein and including the common femoral, femoral, profunda femoral, popliteal and calf veins including the posterior tibial, peroneal and gastrocnemius veins when visible. The superficial great saphenous vein was also interrogated. Spectral Doppler was utilized to evaluate flow at rest and with distal augmentation maneuvers in the common femoral, femoral and popliteal veins. COMPARISON:  None. FINDINGS: Contralateral Common Femoral Vein: Respiratory phasicity is normal and symmetric with the symptomatic side. No evidence of thrombus. Normal compressibility. Common Femoral Vein: No evidence of thrombus. Normal compressibility, respiratory phasicity and response to augmentation. Saphenofemoral Junction: No evidence of thrombus. Normal compressibility and flow on color Doppler imaging. Profunda Femoral Vein: No evidence of thrombus. Normal compressibility and flow on color Doppler imaging. Femoral Vein: No evidence of thrombus. Normal compressibility, respiratory phasicity and response to augmentation. Popliteal Vein: No evidence of thrombus. Normal compressibility, respiratory phasicity and response to augmentation. Calf Veins: No evidence of thrombus. Normal compressibility and flow on color Doppler imaging. Superficial Great Saphenous Vein: No evidence of thrombus. Normal compressibility and flow on color Doppler imaging. Venous Reflux:  None. Other Findings:  None. IMPRESSION: No evidence of deep venous thrombosis. Electronically Signed   By: Marcello Moores  Register   On: 04/20/2015 13:05    Assessment & Plan:   Stephane was seen today for nocturia and hip pain.  Diagnoses and all orders for this visit:  Nocturia -     POCT urinalysis dipstick -     POCT UA - Microscopic Only  Hereditary and idiopathic  peripheral neuropathy  Essential hypertension  BPH (benign prostatic hyperplasia)  Arthritis, hip -     betamethasone acetate-betamethasone sodium phosphate (CELESTONE) injection 6 mg; Inject 1 mL (6 mg total) into the muscle once.  Other orders -     DULoxetine (CYMBALTA) 30 MG capsule; Take 1 capsule (30 mg total) by mouth daily. -     silodosin (RAPAFLO) 8 MG CAPS capsule; Take 1 capsule (8 mg total) by mouth daily with breakfast. To improve urinary flow   I have discontinued Mr. Yepes furosemide and tamsulosin. I am also having him start on DULoxetine and silodosin. Additionally, I am having him maintain his fish oil-omega-3 fatty acids, levothyroxine, CALCIUM PO, carvedilol, amLODipine, gabapentin, traMADol, fenofibrate micronized, warfarin, lisinopril, and pantoprazole. We will continue to administer betamethasone acetate-betamethasone sodium phosphate.  Meds ordered this encounter  Medications  . pantoprazole (PROTONIX) 40 MG tablet    Sig:   . DULoxetine (CYMBALTA) 30 MG capsule    Sig: Take 1 capsule (30 mg total) by mouth daily.    Dispense:  30 capsule  Refill:  0  . silodosin (RAPAFLO) 8 MG CAPS capsule    Sig: Take 1 capsule (8 mg total) by mouth daily with breakfast. To improve urinary flow    Dispense:  30 capsule    Refill:  3  . betamethasone acetate-betamethasone sodium phosphate (CELESTONE) injection 6 mg    Sig:      Follow-up: Return in about 3 months (around 11/02/2015), or if symptoms worsen or fail to improve.  Claretta Fraise, M.D.

## 2015-08-03 ENCOUNTER — Telehealth: Payer: Self-pay | Admitting: Family Medicine

## 2015-08-03 NOTE — Telephone Encounter (Signed)
Patient was given cymbalta and rapaflo yesterday by [facility] and can not afford. Please advise?

## 2015-08-04 ENCOUNTER — Telehealth: Payer: Self-pay | Admitting: Family Medicine

## 2015-08-04 MED ORDER — DOXAZOSIN MESYLATE 4 MG PO TABS: 4.0000 mg | ORAL_TABLET | Freq: Every day | ORAL | Status: DC

## 2015-08-04 NOTE — Telephone Encounter (Signed)
Please see previous telephone encounter.

## 2015-08-04 NOTE — Telephone Encounter (Signed)
Patient aware.

## 2015-08-04 NOTE — Telephone Encounter (Signed)
Defer to PCP  Laroy Apple, MD [facility] 08/04/2015, 7:23 AM

## 2015-08-04 NOTE — Telephone Encounter (Signed)
I sent in doxazosin to replace the other prostate/ urine flow med, rapaflo. There isn't a substitute for the cymbalta/duloxetine. If unaffordable, he can go without it safely, but may experience more arthritis and other pain.

## 2015-08-14 ENCOUNTER — Other Ambulatory Visit: Payer: Self-pay | Admitting: Family Medicine

## 2015-08-21 ENCOUNTER — Other Ambulatory Visit: Payer: Self-pay | Admitting: Family Medicine

## 2015-08-23 ENCOUNTER — Ambulatory Visit (INDEPENDENT_AMBULATORY_CARE_PROVIDER_SITE_OTHER): Payer: Medicare Other | Admitting: Pharmacist

## 2015-08-23 DIAGNOSIS — I482 Chronic atrial fibrillation, unspecified: Secondary | ICD-10-CM

## 2015-08-23 DIAGNOSIS — I480 Paroxysmal atrial fibrillation: Secondary | ICD-10-CM

## 2015-08-23 LAB — POCT INR: INR: 3.2

## 2015-08-23 NOTE — Patient Instructions (Signed)
Anticoagulation Dose Instructions as of 08/23/2015      Dorene Grebe Tue Wed Thu Fri Sat   New Dose 5 mg 7.5 mg 5 mg 5 mg 5 mg 7.5 mg 5 mg    Description        Change warfarin dose to 1 tablet daily except 1 and 1/2 on Monday and Fridays.

## 2015-08-28 ENCOUNTER — Other Ambulatory Visit: Payer: Self-pay | Admitting: Family Medicine

## 2015-08-29 NOTE — Telephone Encounter (Signed)
Last seen 08/02/15  Dr Livia Snellen  If approved route to nurse to call into [facility]

## 2015-08-29 NOTE — Telephone Encounter (Signed)
Please review and advise.

## 2015-08-29 NOTE — Telephone Encounter (Signed)
Refill called to [facility] 

## 2015-09-12 ENCOUNTER — Ambulatory Visit (INDEPENDENT_AMBULATORY_CARE_PROVIDER_SITE_OTHER): Payer: Medicare Other | Admitting: Family Medicine

## 2015-09-12 ENCOUNTER — Encounter: Payer: Self-pay | Admitting: Family Medicine

## 2015-09-12 VITALS — BP 105/50 | HR 52 | Temp 97.2°F | Ht 66.0 in | Wt 185.5 lb

## 2015-09-12 DIAGNOSIS — N400 Enlarged prostate without lower urinary tract symptoms: Secondary | ICD-10-CM

## 2015-09-12 DIAGNOSIS — R159 Full incontinence of feces: Secondary | ICD-10-CM

## 2015-09-12 MED ORDER — TERAZOSIN HCL 10 MG PO CAPS: 10.0000 mg | ORAL_CAPSULE | Freq: Every day | ORAL | Status: DC

## 2015-09-12 MED ORDER — DIPHENOXYLATE-ATROPINE 2.5-0.025 MG PO TABS: 1.0000 | ORAL_TABLET | Freq: Four times a day (QID) | ORAL | Status: DC | PRN

## 2015-09-12 NOTE — Progress Notes (Signed)
Subjective:  Patient ID: [name], male    DOB: 06/09/1926  Age: 81 y.o. MRN: [id number]  CC: No chief complaint on file.   HPI [name] presents for nocturia X 3/night. Leaking small amounts of thin stool daily for possibly as long as 6 months. Stable, but very bothersome. Has regular BM daily.+ Denies abdominal pain. He has no sense of needing to defecate. He is having some constipation. When the leakage occurs it is liquid, not formed.   follow-up of hypertension. Patient has no history of headache chest pain or shortness of breath or recent cough. Patient also denies symptoms of TIA such as numbness weakness lateralizing. Patient checks  blood pressure at home and has not had any elevated readings recently. Patient denies side effects from his medication. States taking it regularly.  Additionally he is having some urinary frequency overnight/nocturia. Not as much during the day. He feels the doxazosin is not effective. He's having to get up 3-4 times a night. In spite of this he is having no dysuria no nausea no abdominal pain or flank pain.    Patient in for follow-up of atrial fibrillation. Patient denies any recent bouts of chest pain or palpitations. Additionally, patient is taking anticoagulants. Patient denies any recent excessive bleeding episodes including epistaxis, bleeding from the gums, genitalia, rectal bleeding or hematuria. Additionally there has been no excessive bruising.Marland Kitchen   History [place] has a past medical history of Dilated cardiomyopathy ([place]); Atrial fibrillation ([place]); Drug therapy; CAD (coronary artery disease); Hypertension; Dyslipidemia; Hypothyroidism; and COPD (chronic obstructive pulmonary disease) ([place]).   He has past surgical history that includes Hip surgery (Left); Cardiac catheterization (05/2007); and Coronary stent placement.   His family history includes Diabetes in his other; Heart attack in his father and mother; Stroke in his other. He was  adopted.He reports that he has never smoked. He has never used smokeless tobacco. He reports that he does not drink alcohol or use illicit drugs.  Outpatient Prescriptions Prior to Visit  Medication Sig Dispense Refill  . amLODipine (NORVASC) 10 MG tablet TAKE 1 TABLET ONCE A DAY 30 tablet 11  . CALCIUM PO Take 1 tablet by mouth daily.    . carvedilol (COREG) 12.5 MG tablet TAKE (1) TABLET TWICE A DAY. 60 tablet 2  . fenofibrate micronized (LOFIBRA) 134 MG capsule TAKE (1) CAPSULE DAILY BEFORE BREAKFAST. 90 capsule 0  . fish oil-omega-3 fatty acids 1000 MG capsule Take 2 g by mouth daily.    Marland Kitchen gabapentin (NEURONTIN) 600 MG tablet Take 2 tablets (1,200 mg total) by mouth 2 (two) times daily. For burning in legs 120 tablet 5  . levothyroxine (SYNTHROID, LEVOTHROID) 150 MCG tablet TAKE 1 TABLET DAILY 30 tablet 9  . lisinopril (PRINIVIL,ZESTRIL) 40 MG tablet TAKE 1 TABLET ONCE A DAY FOR HIGH BLOOD PRESSURE 30 tablet 3  . warfarin (COUMADIN) 5 MG tablet Take 1-1.5 tablets (5-7.5 mg total) by mouth daily. Take as directed by anticoagulation clinic 32 tablet 2  . zolpidem (AMBIEN) 5 MG tablet TAKE ONE TABLET AT BEDTIME 30 tablet 1  . doxazosin (CARDURA) 4 MG tablet Take 1 tablet (4 mg total) by mouth at bedtime. For one month, then two at bedtime,To help with urine flow 60 tablet 2  . hydrochlorothiazide (HYDRODIURIL) 25 MG tablet TAKE 1 TABLET ONCE A DAY (Patient not taking: Reported on 08/23/2015) 90 tablet 1  . traMADol (ULTRAM) 50 MG tablet Take 1 tablet (50 mg total) by mouth every 6 (six) hours  as needed for moderate pain or severe pain. (Patient not taking: Reported on 08/23/2015) 50 tablet 5   No facility-administered medications prior to visit.    ROS Review of Systems  Constitutional: Negative for fever, chills, diaphoresis and unexpected weight change.  HENT: Negative for congestion, hearing loss, rhinorrhea and sore throat.   Eyes: Negative for visual disturbance.  Respiratory: Negative  for cough and shortness of breath.   Cardiovascular: Negative for chest pain.  Gastrointestinal: Negative for abdominal pain.  Genitourinary: Negative for dysuria and flank pain.  Musculoskeletal: Negative for joint swelling and arthralgias.  Skin: Negative for rash.  Neurological: Negative for dizziness and headaches.  Psychiatric/Behavioral: Negative for sleep disturbance and dysphoric mood.    Objective:  BP 105/50 mmHg  Pulse 52  Temp(Src) 97.2 F (36.2 C) (Oral)  Ht 5\' 6"  (1.676 m)  Wt 185 lb 8 oz (84.142 kg)  BMI 29.95 kg/m2  BP Readings from Last 3 Encounters:  09/12/15 105/50  08/02/15 105/53  07/14/15 138/60    Wt Readings from Last 3 Encounters:  09/12/15 185 lb 8 oz (84.142 kg)  08/02/15 184 lb (83.462 kg)  07/14/15 183 lb (83.008 kg)     Physical Exam  Constitutional: He is oriented to person, place, and time. He appears well-developed and well-nourished. No distress.  HENT:  Head: Normocephalic and atraumatic.  Right Ear: External ear normal.  Left Ear: External ear normal.  Nose: Nose normal.  Mouth/Throat: Oropharynx is clear and moist.  Eyes: Conjunctivae and EOM are normal. Pupils are equal, round, and reactive to light.  Neck: Normal range of motion. Neck supple. No thyromegaly present.  Cardiovascular: Normal rate, regular rhythm and normal heart sounds.   No murmur heard. Pulmonary/Chest: Effort normal and breath sounds normal. No respiratory distress. He has no wheezes. He has no rales.  Abdominal: Soft. Bowel sounds are normal. He exhibits no distension. There is no tenderness.  Lymphadenopathy:    He has no cervical adenopathy.  Neurological: He is alert and oriented to person, place, and time. He has normal reflexes.  Skin: Skin is warm and dry.  Psychiatric: He has a normal mood and affect. His behavior is normal. Judgment and thought content normal.    Lab Results  Component Value Date   HGBA1C 6.1 07/14/2015   HGBA1C 6.0% 11/14/2014     HGBA1C 6.0 11/14/2014    Lab Results  Component Value Date   WBC 11.3* 03/26/2015   HGB 12.6* 03/26/2015   HCT 38.1* 03/26/2015   PLT 145* 03/26/2015   GLUCOSE 126* 06/09/2015   CHOL 166 08/29/2014   TRIG 318* 08/29/2014   HDL 27* 08/29/2014   LDLCALC 75 08/29/2014   ALT 14 06/09/2015   AST 16 06/09/2015   NA 140 06/09/2015   K 4.5 06/09/2015   CL 100 06/09/2015   CREATININE 1.33* 06/09/2015   BUN 30* 06/09/2015   CO2 24 06/09/2015   TSH 0.382 03/26/2015   INR 3.2 08/23/2015   HGBA1C 6.1 07/14/2015    US Carotid Duplex Bilateral  04/20/2015  CLINICAL DATA:  Bilateral carotid disease . EXAM: BILATERAL CAROTID DUPLEX ULTRASOUND TECHNIQUE: Pearline Cables scale imaging, color Doppler and duplex ultrasound were performed of bilateral carotid and vertebral arteries in the neck. COMPARISON:  None. FINDINGS: Criteria: Quantification of carotid stenosis is based on velocity parameters that correlate the residual internal carotid diameter with NASCET-based stenosis levels, using the diameter of the distal internal carotid lumen as the denominator for stenosis measurement. The following velocity  measurements were obtained: RIGHT ICA:  126/17 cm/sec CCA:  [id number] cm/sec SYSTOLIC ICA/CCA RATIO:  2.1 DIASTOLIC ICA/CCA RATIO:  2.3 ECA:  218 cm/sec LEFT ICA:  140/22 cm/sec CCA:  [id number] cm/sec SYSTOLIC ICA/CCA RATIO:  1.8 DIASTOLIC ICA/CCA RATIO:  1.9 ECA:  188 cm/sec RIGHT CAROTID ARTERY: Moderate calcified plaque right carotid bifurcation proximal internal carotid artery. RIGHT VERTEBRAL ARTERY:  Patent with antegrade flow. LEFT CAROTID ARTERY: Moderate calcified plaque left carotid bifurcation and proximal internal carotid artery. LEFT VERTEBRAL ARTERY:  Patent with antegrade flow. IMPRESSION: 1. Moderate bilateral carotid bifurcation disease. Degree of stenosis 50-69% bilaterally. 2. Vertebral arteries are patent with antegrade flow. Electronically Signed   By: Marcello Moores  Register   On: 04/20/2015 13:04   US  Venous Img Lower Unilateral Right  04/20/2015  CLINICAL DATA:  Right lower extremity swelling. EXAM: RIGHT LOWER EXTREMITY VENOUS DOPPLER ULTRASOUND TECHNIQUE: Gray-scale sonography with graded compression, as well as color Doppler and duplex ultrasound were performed to evaluate the lower extremity deep venous systems from the level of the common femoral vein and including the common femoral, femoral, profunda femoral, popliteal and calf veins including the posterior tibial, peroneal and gastrocnemius veins when visible. The superficial great saphenous vein was also interrogated. Spectral Doppler was utilized to evaluate flow at rest and with distal augmentation maneuvers in the common femoral, femoral and popliteal veins. COMPARISON:  None. FINDINGS: Contralateral Common Femoral Vein: Respiratory phasicity is normal and symmetric with the symptomatic side. No evidence of thrombus. Normal compressibility. Common Femoral Vein: No evidence of thrombus. Normal compressibility, respiratory phasicity and response to augmentation. Saphenofemoral Junction: No evidence of thrombus. Normal compressibility and flow on color Doppler imaging. Profunda Femoral Vein: No evidence of thrombus. Normal compressibility and flow on color Doppler imaging. Femoral Vein: No evidence of thrombus. Normal compressibility, respiratory phasicity and response to augmentation. Popliteal Vein: No evidence of thrombus. Normal compressibility, respiratory phasicity and response to augmentation. Calf Veins: No evidence of thrombus. Normal compressibility and flow on color Doppler imaging. Superficial Great Saphenous Vein: No evidence of thrombus. Normal compressibility and flow on color Doppler imaging. Venous Reflux:  None. Other Findings:  None. IMPRESSION: No evidence of deep venous thrombosis. Electronically Signed   By: Marcello Moores  Register   On: 04/20/2015 13:05    Assessment & Plan:   Diagnoses and all orders for this visit:  Fecal  soiling due to fecal incontinence  BPH (benign prostatic hyperplasia)  Other orders -     diphenoxylate-atropine (LOMOTIL) 2.5-0.025 MG tablet; Take 1 tablet by mouth 4 (four) times daily as needed for diarrhea or loose stools. -     terazosin (HYTRIN) 10 MG capsule; Take 1 capsule (10 mg total) by mouth at bedtime. To decrease urination frequency at night   I have discontinued Mr. Katzer doxazosin. I am also having him start on diphenoxylate-atropine and terazosin. Additionally, I am having him maintain his fish oil-omega-3 fatty acids, levothyroxine, CALCIUM PO, amLODipine, gabapentin, traMADol, fenofibrate micronized, warfarin, lisinopril, hydrochlorothiazide, carvedilol, and zolpidem.  Meds ordered this encounter  Medications  . diphenoxylate-atropine (LOMOTIL) 2.5-0.025 MG tablet    Sig: Take 1 tablet by mouth 4 (four) times daily as needed for diarrhea or loose stools.    Dispense:  60 tablet    Refill:  2  . terazosin (HYTRIN) 10 MG capsule    Sig: Take 1 capsule (10 mg total) by mouth at bedtime. To decrease urination frequency at night    Dispense:  30 capsule    Refill:  5   The current fecal soiling appears to be related to constipation and leakage around that. He will use over-the-counter laxatives as discussed and follow-up should symptoms not resolve.  Follow-up: Return in about 3 months (around 12/12/2015) for diabetes, BPH, stool leakage.  Claretta Fraise, M.D.

## 2015-10-02 ENCOUNTER — Other Ambulatory Visit: Payer: Self-pay | Admitting: Family Medicine

## 2015-10-02 DIAGNOSIS — C4442 Squamous cell carcinoma of skin of scalp and neck: Secondary | ICD-10-CM | POA: Diagnosis not present

## 2015-10-02 DIAGNOSIS — L578 Other skin changes due to chronic exposure to nonionizing radiation: Secondary | ICD-10-CM | POA: Diagnosis not present

## 2015-10-02 DIAGNOSIS — D485 Neoplasm of uncertain behavior of skin: Secondary | ICD-10-CM | POA: Diagnosis not present

## 2015-10-02 DIAGNOSIS — C44329 Squamous cell carcinoma of skin of other parts of face: Secondary | ICD-10-CM | POA: Diagnosis not present

## 2015-10-03 NOTE — Telephone Encounter (Signed)
Last seen 09/12/15 Dr Livia Snellen last lipid 08/29/14  Requesting 90 day supply

## 2015-10-04 ENCOUNTER — Encounter: Payer: Self-pay | Admitting: Pharmacist

## 2015-10-26 ENCOUNTER — Ambulatory Visit (INDEPENDENT_AMBULATORY_CARE_PROVIDER_SITE_OTHER): Payer: Medicare Other | Admitting: Pharmacist

## 2015-10-26 ENCOUNTER — Encounter: Payer: Self-pay | Admitting: Pharmacist

## 2015-10-26 DIAGNOSIS — I480 Paroxysmal atrial fibrillation: Secondary | ICD-10-CM

## 2015-10-26 DIAGNOSIS — I482 Chronic atrial fibrillation, unspecified: Secondary | ICD-10-CM

## 2015-10-26 LAB — POCT INR: INR: 1.9

## 2015-10-26 NOTE — Patient Instructions (Addendum)
Decrease gabapentin 600mg  to 1 tablet twice a day for 5 days (10/27/2015 through 10/31/2015) Then decreased to gabapentin 600mg  1 tablet at bedtime only for 5 days (11/01/2015 through 11/05/2015) Then may stop gabapentin.     Anticoagulation Dose Instructions as of 10/26/2015      [name] Tue Wed Thu Fri Sat   New Dose 5 mg 7.5 mg 5 mg 5 mg 5 mg 7.5 mg 5 mg    Description        Take an extra 1/2 tablet today - Thursday, January 12th, then continue current warfarin dose of 1 tablet daily except 1 and 1/2 on Monday and Fridays.       INR was 1.9 today

## 2015-10-26 NOTE — Progress Notes (Signed)
Subjective:     Indication: atrial fibrillation Bleeding signs/symptoms: None Thromboembolic signs/symptoms: None  Missed Coumadin doses: None Medication changes: yes - patient has discontinue terazosin on his own - he felt it wasn't helping. Dietary changes: no Bacterial/viral infection: no Other concerns: yes - patient would also like advise about stopping gabapentin.  He doesn't to feel that it is helping with neuropathy  The following portions of the patient's history were reviewed and updated as appropriate: allergies, current medications and problem list.    Objective:    INR Today: 1.9 Current dose: warfarin 5mg  - take 1 and 1/2 tablets on mondays and Friday and 1 tablet all other days  Assessment:    Subtherapeutic INR for goal of 2-3   Plan:    1. New dose: take is to take an extra 1/2 tablet today, then resume usual dose of warfarin 5mg  - 1 and 1/2 tablets on mondays and fridays and 1 tablet all other days.    2. Next INR: 4 weeks   3.  At patient request wants to discontinue gabapentin. Patient advised to decrease dose to 600mg  bid for next 5 days, then 600mg  qhs for 5 days, then discontinue.  4.  Follow up with PCP 12/2015  Cherre Robins, PharmD, CPP

## 2015-10-30 ENCOUNTER — Other Ambulatory Visit: Payer: Self-pay | Admitting: Family Medicine

## 2015-11-08 DIAGNOSIS — R531 Weakness: Secondary | ICD-10-CM | POA: Diagnosis not present

## 2015-11-09 ENCOUNTER — Encounter (HOSPITAL_COMMUNITY): Payer: Self-pay

## 2015-11-09 ENCOUNTER — Emergency Department (HOSPITAL_COMMUNITY): Payer: Medicare Other

## 2015-11-09 ENCOUNTER — Emergency Department (HOSPITAL_COMMUNITY)
Admission: EM | Admit: 2015-11-09 | Discharge: 2015-11-09 | Disposition: A | Discharge status: Home / Self Care | Payer: Medicare Other | Attending: Emergency Medicine | Admitting: Emergency Medicine

## 2015-11-09 DIAGNOSIS — I499 Cardiac arrhythmia, unspecified: Secondary | ICD-10-CM | POA: Diagnosis not present

## 2015-11-09 DIAGNOSIS — Z79899 Other long term (current) drug therapy: Secondary | ICD-10-CM | POA: Diagnosis not present

## 2015-11-09 DIAGNOSIS — Z791 Long term (current) use of non-steroidal anti-inflammatories (NSAID): Secondary | ICD-10-CM | POA: Diagnosis not present

## 2015-11-09 DIAGNOSIS — Z9889 Other specified postprocedural states: Secondary | ICD-10-CM | POA: Insufficient documentation

## 2015-11-09 DIAGNOSIS — I2510 Atherosclerotic heart disease of native coronary artery without angina pectoris: Secondary | ICD-10-CM | POA: Diagnosis not present

## 2015-11-09 DIAGNOSIS — J449 Chronic obstructive pulmonary disease, unspecified: Secondary | ICD-10-CM | POA: Diagnosis not present

## 2015-11-09 DIAGNOSIS — I10 Essential (primary) hypertension: Secondary | ICD-10-CM | POA: Insufficient documentation

## 2015-11-09 DIAGNOSIS — E785 Hyperlipidemia, unspecified: Secondary | ICD-10-CM | POA: Diagnosis not present

## 2015-11-09 DIAGNOSIS — Z9861 Coronary angioplasty status: Secondary | ICD-10-CM | POA: Insufficient documentation

## 2015-11-09 DIAGNOSIS — R109 Unspecified abdominal pain: Secondary | ICD-10-CM | POA: Diagnosis not present

## 2015-11-09 DIAGNOSIS — E039 Hypothyroidism, unspecified: Secondary | ICD-10-CM | POA: Insufficient documentation

## 2015-11-09 DIAGNOSIS — M2550 Pain in unspecified joint: Secondary | ICD-10-CM | POA: Insufficient documentation

## 2015-11-09 DIAGNOSIS — I4891 Unspecified atrial fibrillation: Secondary | ICD-10-CM | POA: Insufficient documentation

## 2015-11-09 DIAGNOSIS — R1084 Generalized abdominal pain: Secondary | ICD-10-CM | POA: Diagnosis not present

## 2015-11-09 DIAGNOSIS — Z7901 Long term (current) use of anticoagulants: Secondary | ICD-10-CM | POA: Insufficient documentation

## 2015-11-09 DIAGNOSIS — K8020 Calculus of gallbladder without cholecystitis without obstruction: Secondary | ICD-10-CM | POA: Diagnosis not present

## 2015-11-09 DIAGNOSIS — N200 Calculus of kidney: Secondary | ICD-10-CM | POA: Diagnosis not present

## 2015-11-09 DIAGNOSIS — Z880 Allergy status to penicillin: Secondary | ICD-10-CM | POA: Diagnosis not present

## 2015-11-09 LAB — COMPREHENSIVE METABOLIC PANEL
ALBUMIN: 4.3 g/dL (ref 3.5–5.0)
ALT: 13 U/L — ABNORMAL LOW (ref 17–63)
ANION GAP: 10 (ref 5–15)
AST: 20 U/L (ref 15–41)
Alkaline Phosphatase: 40 U/L (ref 38–126)
BUN: 50 mg/dL — AB (ref 6–20)
CHLORIDE: 105 mmol/L (ref 101–111)
CO2: 26 mmol/L (ref 22–32)
Calcium: 9.6 mg/dL (ref 8.9–10.3)
Creatinine, Ser: 2.07 mg/dL — ABNORMAL HIGH (ref 0.61–1.24)
GFR calc Af Amer: 31 mL/min — ABNORMAL LOW (ref >60)
GFR calc non Af Amer: 27 mL/min — ABNORMAL LOW (ref >60)
GLUCOSE: 129 mg/dL — AB (ref 65–99)
POTASSIUM: 3.9 mmol/L (ref 3.5–5.1)
SODIUM: 141 mmol/L (ref 135–145)
Total Bilirubin: 0.8 mg/dL (ref 0.3–1.2)
Total Protein: 7.3 g/dL (ref 6.5–8.1)

## 2015-11-09 LAB — URINE MICROSCOPIC-ADD ON
SQUAMOUS EPITHELIAL / LPF: NONE SEEN
WBC UA: NONE SEEN WBC/hpf (ref 0–5)

## 2015-11-09 LAB — URINALYSIS, ROUTINE W REFLEX MICROSCOPIC
Bilirubin Urine: NEGATIVE
GLUCOSE, UA: NEGATIVE mg/dL
Hgb urine dipstick: NEGATIVE
Ketones, ur: NEGATIVE mg/dL
LEUKOCYTES UA: NEGATIVE
Nitrite: NEGATIVE
PH: 7 (ref 5.0–8.0)
SPECIFIC GRAVITY, URINE: 1.01 (ref 1.005–1.030)

## 2015-11-09 LAB — CBC WITH DIFFERENTIAL/PLATELET
BASOS ABS: 0 10*3/uL (ref 0.0–0.1)
BASOS PCT: 0 %
EOS ABS: 0.2 10*3/uL (ref 0.0–0.7)
Eosinophils Relative: 3 %
HCT: 43.2 % (ref 39.0–52.0)
Hemoglobin: 14.2 g/dL (ref 13.0–17.0)
Lymphocytes Relative: 29 %
Lymphs Abs: 1.4 10*3/uL (ref 0.7–4.0)
MCH: 27.2 pg (ref 26.0–34.0)
MCHC: 32.9 g/dL (ref 30.0–36.0)
MCV: 82.6 fL (ref 78.0–100.0)
MONO ABS: 0.5 10*3/uL (ref 0.1–1.0)
MONOS PCT: 10 %
NEUTROS PCT: 58 %
Neutro Abs: 2.7 10*3/uL (ref 1.7–7.7)
Platelets: 253 10*3/uL (ref 150–400)
RBC: 5.23 MIL/uL (ref 4.22–5.81)
RDW: 13.3 % (ref 11.5–15.5)
WBC: 4.8 10*3/uL (ref 4.0–10.5)

## 2015-11-09 LAB — PROTIME-INR
INR: 2.12 — AB (ref 0.00–1.49)
PROTHROMBIN TIME: 23.6 s — AB (ref 11.6–15.2)

## 2015-11-09 LAB — LIPASE, BLOOD: LIPASE: 22 U/L (ref 11–51)

## 2015-11-09 NOTE — ED Notes (Signed)
PT taken to CT by [facility].

## 2015-11-09 NOTE — ED Provider Notes (Signed)
CSN: [id number]     Arrival date & time 11/09/15  [id number] History  By signing my name below, I, Rohini Rajnarayanan, attest that this documentation has been prepared under the direction and in the presence of No att. providers found Electronically Signed: Evonnie Dawes, ED Scribe 11/09/2015 at 4:59 PM.  Chief Complaint  Patient presents with  . Abdominal Pain   The history is provided by the patient. No language interpreter was used.   HPI Comments: [name] is a 82 y.o. male with a h/o CAD, COPD, HTN, and Afib, who presents to the Emergency Department complaining of constant, waxing and waning, moderate, generalized, aching abdominal pain for 3 days which began to gradually worsen this morning. He reports associated intermittent burning sensation to his abdomen.  He notes the pain has kept him from sleep. Pt denies any alleviating factors. He has been eating normally and reports normal appetite. Pt denies h/o abdominal surgeries, issues with gallbladder or pancreas or kidney stone Hx. Pt denies any h/o CVA.  Patient denies any vomiting, nausea, diarrhea, constipation, fever, hematochezia, back pain, urinary symptoms, rashes, SOB, or coughing  Pt also complains of burning pain in bilateral lower extremities for an unknown amount of time which became increasingly worse over the past day. He states it is exacerbated by walking. He notes pain is alleviated with rest. Pt does not use a cane. Pt denies h/o of peripheral artery disease. He denies any other associated symptoms at this time.  Past Medical History  Diagnosis Date  . Dilated cardiomyopathy ([place])     Felt to be secondary to atrial fibrillation. EF is about 40%  . Atrial fibrillation ([facility])     Persistent, treated with amiodarone and cardioversion  . Drug therapy     Chronic Coumadin therapy  . CAD (coronary artery disease)     Last catheterization in August 2008 demonstrated the LAD 40% stenosis  . Hypertension   . Dyslipidemia    . Hypothyroidism     Possibly related to amiodarone  . COPD (chronic obstructive pulmonary disease) [facility])    Past Surgical History  Procedure Laterality Date  . Hip surgery Left     8yo  . Cardiac catheterization  05/2007    LAD 40% stenosis. Stent in the LAD had 20% in-stent restenosis. Followed by 40-50% distal stenosis. 50% diagonal stenosis. 40-50% circumflex stenosis. 70% OM1 stenosis and 30% ostial RCA stenosis. 30% proximal RCA stenosis.  . Coronary stent placement     Family History  Problem Relation Age of Onset  . Adopted: Yes  . Heart attack Mother   . Heart attack Father   . Diabetes Other   . Stroke Other    Social History  Substance Use Topics  . Smoking status: Never Smoker   . Smokeless tobacco: Never Used  . Alcohol Use: No     Comment: quit at age 89yo    Review of Systems  Gastrointestinal: Positive for abdominal pain.  Musculoskeletal: Positive for arthralgias.  All other systems reviewed and are negative.     Allergies  Amiodarone and Penicillins  Home Medications   Prior to Admission medications   Medication Sig Start Date End Date Taking? Authorizing Provider  amLODipine (NORVASC) 10 MG tablet TAKE 1 TABLET ONCE A DAY 05/15/15  Yes Minus Breeding, MD  CALCIUM PO Take 1 tablet by mouth daily.   Yes Historical Provider, MD  carvedilol (COREG) 12.5 MG tablet TAKE (1) TABLET TWICE A DAY. 08/21/15  Yes Claretta Fraise,  MD  fenofibrate micronized (LOFIBRA) 134 MG capsule TAKE (1) CAPSULE DAILY BEFORE BREAKFAST. 10/04/15  Yes Claretta Fraise, MD  fish oil-omega-3 fatty acids 1000 MG capsule Take 2 g by mouth daily.   Yes Historical Provider, MD  hydrochlorothiazide (HYDRODIURIL) 25 MG tablet TAKE 1 TABLET ONCE A DAY 08/14/15  Yes Claretta Fraise, MD  levothyroxine (SYNTHROID, LEVOTHROID) 150 MCG tablet TAKE 1 TABLET DAILY 01/19/15  Yes Claretta Fraise, MD  lisinopril (PRINIVIL,ZESTRIL) 40 MG tablet TAKE 1 TABLET ONCE A DAY FOR HIGH BLOOD PRESSURE 08/01/15  Yes  Claretta Fraise, MD  naproxen sodium (ANAPROX) 220 MG tablet Take 440 mg by mouth 2 (two) times daily with a meal.   Yes Historical Provider, MD  warfarin (COUMADIN) 5 MG tablet Take 5 mg once daily except on Monday and Friday take 7.5 mg. 10/31/15  Yes Claretta Fraise, MD  gabapentin (NEURONTIN) 600 MG tablet Take 2 tablets (1,200 mg total) by mouth 2 (two) times daily. For burning in legs Patient not taking: Reported on 11/09/2015 06/09/15   Claretta Fraise, MD  traMADol (ULTRAM) 50 MG tablet Take 1 tablet (50 mg total) by mouth every 6 (six) hours as needed for moderate pain or severe pain. Patient not taking: Reported on 11/09/2015 06/09/15   Claretta Fraise, MD  zolpidem (AMBIEN) 5 MG tablet TAKE ONE TABLET AT BEDTIME Patient not taking: Reported on 11/09/2015 08/29/15   Claretta Fraise, MD   BP 169/72 mmHg  Pulse 58  Temp(Src) 97.6 F (36.4 C) (Oral)  Resp 18  Ht 5\' 6"  (1.676 m)  Wt 177 lb (80.287 kg)  BMI 28.58 kg/m2  SpO2 96% Physical Exam  Constitutional: He is oriented to person, place, and time. He appears well-developed and well-nourished. No distress.  HENT:  Head: Normocephalic and atraumatic.  Eyes: Conjunctivae and EOM are normal.  Neck: Neck supple. No tracheal deviation present.  Cardiovascular: Normal rate, normal heart sounds and intact distal pulses.  Exam reveals no gallop and no friction rub.   No murmur heard. Irregular rhythm, slightly bradycardic.   Pulmonary/Chest: Effort normal and breath sounds normal. No respiratory distress.  Abdominal: He exhibits no distension. There is tenderness (Minimal). There is no guarding.  Musculoskeletal: Normal range of motion.  Warm feet, good capillary refill and normal temp. Dopplerable pulses in L and R foot, but they are not palpable.  No calf tenderness or leg swelling. Slight bronze discoloration to both lower extremities.  Upper sensation and motor intact.   Neurological: He is alert and oriented to person, place, and time.   Slight left sided mouth droop that disappeared when pt smiled. Cranial nerves intact otherwise.  Skin: Skin is warm and dry. No rash noted.  Psychiatric: He has a normal mood and affect. His behavior is normal.  Nursing note and vitals reviewed.   ED Course  Procedures (including critical care time) DIAGNOSTIC STUDIES: Oxygen Saturation is 96% on RA, normal by my interpretation.    COORDINATION OF CARE: 4:59 PM-Discussed treatment plan which includes 12-Lead EKG, comprehensive metabolic panel, urinalysis, Protime-INR and CBC with differential  with pt at bedside and pt agreed to plan.   Labs Review Labs Reviewed  COMPREHENSIVE METABOLIC PANEL - Abnormal; Notable for the following:    Glucose, Bld 129 (*)    BUN 50 (*)    Creatinine, Ser 2.07 (*)    ALT 13 (*)    GFR calc non Af Amer 27 (*)    GFR calc Af Amer 31 (*)  All other components within normal limits  URINALYSIS, ROUTINE W REFLEX MICROSCOPIC (NOT AT [facility]) - Abnormal; Notable for the following:    Protein, ur TRACE (*)    All other components within normal limits  PROTIME-INR - Abnormal; Notable for the following:    Prothrombin Time 23.6 (*)    INR 2.12 (*)    All other components within normal limits  URINE MICROSCOPIC-ADD ON - Abnormal; Notable for the following:    Bacteria, UA FEW (*)    All other components within normal limits  CBC WITH DIFFERENTIAL/PLATELET  LIPASE, BLOOD    Imaging Review US Abdomen Complete  11/09/2015  CLINICAL DATA:  Abdominal pain. EXAM: ABDOMEN ULTRASOUND COMPLETE COMPARISON:  02/17/2014. FINDINGS: Gallbladder: No gallstones or wall thickening visualized. No sonographic Murphy sign noted by sonographer. Common bile duct: Diameter: 5 mm Liver: No focal lesion identified. Within normal limits in parenchymal echogenicity. IVC: No abnormality visualized. Pancreas: Visualized portion unremarkable. Spleen: Size and appearance within normal limits. Small accessory spleen noted. Right Kidney:  Length: 11.4 cm. Echogenicity within normal limits. No mass or hydronephrosis visualized. 7 mm nonobstructing lower pole calyceal stone . Left Kidney: Length: 11.2 cm. Echogenicity within normal limits. No mass or hydronephrosis visualized. 2.3 cm simple cyst upper pole Abdominal aorta: No aneurysm visualized. Other findings: None. IMPRESSION: 1.  No acute abnormality identified. 2.  7 mm lower renal pole nonobstructing caliceal stone. Electronically Signed   By: Marcello Moores  Register   On: 11/09/2015 11:50   Ct Renal Stone Study  11/09/2015  CLINICAL DATA:  Generalized abdominal pain for 1 week EXAM: CT ABDOMEN AND PELVIS WITHOUT CONTRAST TECHNIQUE: Multidetector CT imaging of the abdomen and pelvis was performed following the standard protocol without oral or intravenous contrast material administration. COMPARISON:  April 24, 2009 CT abdomen FINDINGS: Lower chest: There is scarring in portions of the right middle and lower lobe. There is mild fibrotic change in the inferomedial right base. No edema or consolidation is noted lung bases. There are multiple foci of coronary artery calcification as well as calcification in the distal thoracic aorta. Hepatobiliary: No focal liver lesions are identified on this noncontrast enhanced study. There is cholelithiasis. The gallbladder wall is not appreciably thickened. There is no biliary duct dilatation. Pancreas: There is no pancreatic mass or inflammatory focus. Spleen: No splenic lesions are identified. There is a small splenule inferior to the spleen. Adrenals/Urinary Tract: Adrenals appear normal bilaterally. There is no mass or hydronephrosis on either side. There is no renal or ureteral calculus on either side. Urinary bladder is midline with wall thickness within normal limits. Stomach/Bowel: There are multiple sigmoid diverticula without diverticulitis. There is no bowel wall or mesenteric thickening. There is no bowel obstruction. No free air or portal venous air. No  bowel pneumatosis. Vascular/Lymphatic: There is atherosclerotic calcification in the aorta and iliac arteries. No abdominal aortic aneurysm. There is moderate calcification in the proximal major mesenteric vessels. There is no demonstrable ascites in the abdomen or pelvis. Reproductive: The prostate and seminal vesicles appear unremarkable for age. There are several prostatic calculi. Other: Appendix appears normal. There is fat in each inguinal ring, somewhat more on the right than on the left. There is no abscess or ascites in the abdomen or pelvis. Musculoskeletal: There is degenerative change in the lumbar spine. There are no blastic or lytic bone lesions. There is no intramuscular or abdominal wall lesion. IMPRESSION: No bowel obstruction. No abscess. Multiple sigmoid diverticula without diverticulitis. Appendix appears normal. There are  several prostatic calculi. There is no renal or ureteral calculus. No hydronephrosis. There is fairly extensive atherosclerotic calcification in the aorta and major mesenteric vessels. There is no bowel pneumatosis. No free air or bowel wall thickening. There is cholelithiasis without appreciable gallbladder wall thickening. There are multiple foci of coronary artery calcification. Electronically Signed   By: Lowella Grip III M.D.   On: 11/09/2015 14:57   I have personally reviewed and evaluated these images and lab results as part of my medical decision-making.   EKG Interpretation   Date/Time:  Thursday November 09 2015 09:33:00 EST Ventricular Rate:  56 PR Interval:  244 QRS Duration: 149 QT Interval:  486 QTC Calculation: 469 R Axis:   50 Text Interpretation:  Sinus rhythm Multiple ventricular premature  complexes Prolonged PR interval IVCD, consider atypical LBBB slower rate  than 03/26/15 Confirmed by Anila Bojarski MD, Corene Cornea [phone]) on 11/09/2015 9:43:45 AM      MDM   Final diagnoses:  Abdominal pain   82 yo M here with likely claudication symptoms  over lower extremities and possibly of abdomen as well. Labs and imaging unremarkable aside from a slight bump in his creatinine. patietn tolerating PO and no changes in abdominal exam at this time. Will follow up with PCP for further workup (possibly as early as tomorrow), however will return here for any new/worsening symptoms.  I personally performed the services described in this documentation, which was scribed in my presence. The recorded information has been reviewed and is accurate.      Merrily Pew, MD 11/09/15 [phone]

## 2015-11-09 NOTE — ED Notes (Signed)
Pt reports abd pain x 1 week.  Denies any n/v/d.  Also reports pain, burning, and cramping in lower legs for " a while."

## 2015-11-10 ENCOUNTER — Ambulatory Visit (INDEPENDENT_AMBULATORY_CARE_PROVIDER_SITE_OTHER): Payer: Medicare Other

## 2015-11-10 ENCOUNTER — Encounter: Payer: Self-pay | Admitting: Family Medicine

## 2015-11-10 ENCOUNTER — Ambulatory Visit (INDEPENDENT_AMBULATORY_CARE_PROVIDER_SITE_OTHER): Payer: Medicare Other | Admitting: Family Medicine

## 2015-11-10 VITALS — BP 95/61 | HR 60 | Temp 96.5°F | Ht 66.0 in | Wt 174.8 lb

## 2015-11-10 DIAGNOSIS — R1030 Lower abdominal pain, unspecified: Secondary | ICD-10-CM | POA: Diagnosis not present

## 2015-11-10 LAB — CMP14+EGFR
ALT: 11 IU/L (ref 0–44)
AST: 17 IU/L (ref 0–40)
Albumin/Globulin Ratio: 1.6 (ref 1.1–2.5)
Albumin: 4.4 g/dL (ref 3.5–4.7)
Alkaline Phosphatase: 46 IU/L (ref 39–117)
BUN/Creatinine Ratio: 24 — ABNORMAL HIGH (ref 10–22)
BUN: 52 mg/dL — AB (ref 8–27)
Bilirubin Total: 0.3 mg/dL (ref 0.0–1.2)
CALCIUM: 9.8 mg/dL (ref 8.6–10.2)
CO2: 24 mmol/L (ref 18–29)
CREATININE: 2.13 mg/dL — AB (ref 0.76–1.27)
Chloride: 103 mmol/L (ref 96–106)
GFR calc Af Amer: 31 mL/min/{1.73_m2} — ABNORMAL LOW (ref >59)
GFR, EST NON AFRICAN AMERICAN: 27 mL/min/{1.73_m2} — AB (ref >59)
GLOBULIN, TOTAL: 2.7 g/dL (ref 1.5–4.5)
Glucose: 137 mg/dL — ABNORMAL HIGH (ref 65–99)
Potassium: 4.5 mmol/L (ref 3.5–5.2)
Sodium: 137 mmol/L (ref 134–144)
Total Protein: 7.1 g/dL (ref 6.0–8.5)

## 2015-11-10 LAB — CBC WITH DIFFERENTIAL/PLATELET
BASOS ABS: 0 10*3/uL (ref 0.0–0.2)
Basos: 0 %
EOS (ABSOLUTE): 0.2 10*3/uL (ref 0.0–0.4)
Eos: 3 %
HEMATOCRIT: 41.5 % (ref 37.5–51.0)
Hemoglobin: 14.3 g/dL (ref 12.6–17.7)
Lymphocytes Absolute: 1.8 10*3/uL (ref 0.7–3.1)
Lymphs: 29 %
MCH: 27.8 pg (ref 26.6–33.0)
MCHC: 34.5 g/dL (ref 31.5–35.7)
MCV: 81 fL (ref 79–97)
MONOCYTES: 8 %
Monocytes Absolute: 0.5 10*3/uL (ref 0.1–0.9)
NEUTROS ABS: 3.9 10*3/uL (ref 1.4–7.0)
Neutrophils: 60 %
PLATELETS: 285 10*3/uL (ref 150–379)
RBC: 5.14 x10E6/uL (ref 4.14–5.80)
RDW: 14.3 % (ref 12.3–15.4)
WBC: 6.4 10*3/uL (ref 3.4–10.8)

## 2015-11-10 LAB — POCT URINALYSIS DIPSTICK
Blood, UA: NEGATIVE
GLUCOSE UA: NEGATIVE
LEUKOCYTES UA: NEGATIVE
NITRITE UA: NEGATIVE
Protein, UA: NEGATIVE
Spec Grav, UA: 1.025
UROBILINOGEN UA: NEGATIVE
pH, UA: 5

## 2015-11-10 NOTE — Patient Instructions (Signed)
Use clear liquid diet for 1-2 days, until pain subsides  Clear Liquid Diet A clear liquid diet is a short-term diet that is prescribed to provide the necessary fluid and basic energy you need when you can have nothing else. The clear liquid diet consists of liquids or solids that will become liquid at room temperature. You should be able to see through the liquid. There are many reasons that you may be restricted to clear liquids, such as:  When you have a sudden-onset (acute) condition that occurs before or after surgery.  To help your body slowly get adjusted to food again after a long period when you were unable to have food.  Replacement of fluids when you have a diarrheal disease.  When you are going to have certain exams, such as a colonoscopy, in which instruments are inserted inside your body to look at parts of your digestive system. WHAT CAN I HAVE? A clear liquid diet does not provide all the nutrients you need. It is important to choose a variety of the following items to get as many nutrients as possible:  Vegetable juices that do not have pulp.  Fruit juices and fruit drinks that do not have pulp.  Coffee (regular or decaffeinated), tea, or soda at the discretion of your health care provider.  Clear bouillon, broth, or strained broth-based soups.  High-protein and flavored gelatins.  Sugar or honey.  Ices or frozen ice pops that do not contain milk. If you are not sure whether you can have certain items, you should ask your health care provider. You may also ask your health care provider if there are any other clear liquid options.   This information is not intended to replace advice given to you by your health care provider. Make sure you discuss any questions you have with your health care provider.   Document Released: 09/30/2005 Document Revised: 10/05/2013 Document Reviewed: 08/27/2013 Elsevier Interactive Patient Education [employer].

## 2015-11-10 NOTE — Progress Notes (Signed)
Subjective:  Patient ID: [name], male    DOB: 03-04-1926  Age: 82 y.o. MRN: [id number]  CC: Follow-up; Abdominal Pain; Fatigue; Leg Pain; Insomnia; and Shoulder Pain   HPI [name] presents for 3 days of increasing BLQ and suprapubic pain. Moderate in intensity. Appetite decreased.Pain stable, but not improving. Described as sharp with throbbing. Not related to urination.   History [name] has a past medical history of Dilated cardiomyopathy ([place]); Atrial fibrillation ([place]); Drug therapy; CAD (coronary artery disease); Hypertension; Dyslipidemia; Hypothyroidism; and COPD (chronic obstructive pulmonary disease) ([place]).   He has past surgical history that includes Hip surgery (Left); Cardiac catheterization (05/2007); and Coronary stent placement.   His family history includes Diabetes in his other; Heart attack in his father and mother; Stroke in his other. He was adopted.He reports that he has never smoked. He has never used smokeless tobacco. He reports that he does not drink alcohol or use illicit drugs.  Current Outpatient Prescriptions on File Prior to Visit  Medication Sig Dispense Refill  . amLODipine (NORVASC) 10 MG tablet TAKE 1 TABLET ONCE A DAY 30 tablet 11  . CALCIUM PO Take 1 tablet by mouth daily.    . carvedilol (COREG) 12.5 MG tablet TAKE (1) TABLET TWICE A DAY. 60 tablet 2  . fenofibrate micronized (LOFIBRA) 134 MG capsule TAKE (1) CAPSULE DAILY BEFORE BREAKFAST. 90 capsule 0  . fish oil-omega-3 fatty acids 1000 MG capsule Take 2 g by mouth daily.    . hydrochlorothiazide (HYDRODIURIL) 25 MG tablet TAKE 1 TABLET ONCE A DAY 90 tablet 1  . levothyroxine (SYNTHROID, LEVOTHROID) 150 MCG tablet TAKE 1 TABLET DAILY 30 tablet 9  . lisinopril (PRINIVIL,ZESTRIL) 40 MG tablet TAKE 1 TABLET ONCE A DAY FOR HIGH BLOOD PRESSURE 30 tablet 3  . naproxen sodium (ANAPROX) 220 MG tablet Take 440 mg by mouth 2 (two) times daily with a meal.    . traMADol (ULTRAM) 50 MG tablet Take 1  tablet (50 mg total) by mouth every 6 (six) hours as needed for moderate pain or severe pain. 50 tablet 5  . warfarin (COUMADIN) 5 MG tablet Take 5 mg once daily except on Monday and Friday take 7.5 mg. 32 tablet 1  . zolpidem (AMBIEN) 5 MG tablet TAKE ONE TABLET AT BEDTIME 30 tablet 1  . gabapentin (NEURONTIN) 600 MG tablet Take 2 tablets (1,200 mg total) by mouth 2 (two) times daily. For burning in legs (Patient not taking: Reported on 11/09/2015) 120 tablet 5   No current facility-administered medications on file prior to visit.    ROS Review of Systems  Constitutional: Negative for fever, chills, diaphoresis and unexpected weight change.  HENT: Negative for congestion, hearing loss, rhinorrhea and sore throat.   Eyes: Negative for visual disturbance.  Respiratory: Negative for cough and shortness of breath.   Cardiovascular: Negative for chest pain.  Gastrointestinal: Positive for abdominal pain. Negative for diarrhea and constipation.  Genitourinary: Negative for dysuria and flank pain.  Musculoskeletal: Negative for joint swelling and arthralgias.  Skin: Negative for rash.  Neurological: Negative for dizziness and headaches.  Psychiatric/Behavioral: Negative for sleep disturbance and dysphoric mood.    Objective:  BP 95/61 mmHg  Pulse 60  Temp(Src) 96.5 F (35.8 C) (Oral)  Ht '5\' 6"'$  (1.676 m)  Wt 174 lb 12.8 oz (79.289 kg)  BMI 28.23 kg/m2  BP Readings from Last 3 Encounters:  11/10/15 95/61  11/09/15 165/75  09/12/15 105/50    Wt Readings from Last 3  Encounters:  11/10/15 174 lb 12.8 oz (79.289 kg)  11/09/15 177 lb (80.287 kg)  09/12/15 185 lb 8 oz (84.142 kg)     Physical Exam  Constitutional: He is oriented to person, place, and time. He appears well-developed and well-nourished. No distress.  HENT:  Head: Normocephalic and atraumatic.  Right Ear: External ear normal.  Left Ear: External ear normal.  Nose: Nose normal.  Mouth/Throat: Oropharynx is clear and  moist.  Eyes: Conjunctivae and EOM are normal. Pupils are equal, round, and reactive to light.  Neck: Normal range of motion. Neck supple. No thyromegaly present.  Cardiovascular: Normal rate, regular rhythm and normal heart sounds.   No murmur heard. Pulmonary/Chest: Effort normal and breath sounds normal. No respiratory distress. He has no wheezes. He has no rales.  Abdominal: Soft. Bowel sounds are normal. He exhibits distension. There is tenderness.  Lymphadenopathy:    He has no cervical adenopathy.  Neurological: He is alert and oriented to person, place, and time. He has normal reflexes.  Skin: Skin is warm and dry.  Psychiatric: He has a normal mood and affect. His behavior is normal. Judgment and thought content normal.    Lab Results  Component Value Date   HGBA1C 6.1 07/14/2015   HGBA1C 6.0% 11/14/2014   HGBA1C 6.0 11/14/2014    Lab Results  Component Value Date   WBC 6.4 11/10/2015   HGB 14.2 11/09/2015   HCT 41.5 11/10/2015   PLT 285 11/10/2015   GLUCOSE 137* 11/10/2015   CHOL 166 08/29/2014   TRIG 318* 08/29/2014   HDL 27* 08/29/2014   LDLCALC 75 08/29/2014   ALT 11 11/10/2015   AST 17 11/10/2015   NA 137 11/10/2015   K 4.5 11/10/2015   CL 103 11/10/2015   CREATININE 2.13* 11/10/2015   BUN 52* 11/10/2015   CO2 24 11/10/2015   TSH 0.382 03/26/2015   INR 2.12* 11/09/2015   HGBA1C 6.1 07/14/2015    US Abdomen Complete  11/09/2015  CLINICAL DATA:  Abdominal pain. EXAM: ABDOMEN ULTRASOUND COMPLETE COMPARISON:  02/17/2014. FINDINGS: Gallbladder: No gallstones or wall thickening visualized. No sonographic Murphy sign noted by sonographer. Common bile duct: Diameter: 5 mm Liver: No focal lesion identified. Within normal limits in parenchymal echogenicity. IVC: No abnormality visualized. Pancreas: Visualized portion unremarkable. Spleen: Size and appearance within normal limits. Small accessory spleen noted. Right Kidney: Length: 11.4 cm. Echogenicity within normal  limits. No mass or hydronephrosis visualized. 7 mm nonobstructing lower pole calyceal stone . Left Kidney: Length: 11.2 cm. Echogenicity within normal limits. No mass or hydronephrosis visualized. 2.3 cm simple cyst upper pole Abdominal aorta: No aneurysm visualized. Other findings: None. IMPRESSION: 1.  No acute abnormality identified. 2.  7 mm lower renal pole nonobstructing caliceal stone. Electronically Signed   By: Maisie Fus  Register   On: 11/09/2015 11:50   Ct Renal Stone Study  11/09/2015  CLINICAL DATA:  Generalized abdominal pain for 1 week EXAM: CT ABDOMEN AND PELVIS WITHOUT CONTRAST TECHNIQUE: Multidetector CT imaging of the abdomen and pelvis was performed following the standard protocol without oral or intravenous contrast material administration. COMPARISON:  April 24, 2009 CT abdomen FINDINGS: Lower chest: There is scarring in portions of the right middle and lower lobe. There is mild fibrotic change in the inferomedial right base. No edema or consolidation is noted lung bases. There are multiple foci of coronary artery calcification as well as calcification in the distal thoracic aorta. Hepatobiliary: No focal liver lesions are identified on this noncontrast  enhanced study. There is cholelithiasis. The gallbladder wall is not appreciably thickened. There is no biliary duct dilatation. Pancreas: There is no pancreatic mass or inflammatory focus. Spleen: No splenic lesions are identified. There is a small splenule inferior to the spleen. Adrenals/Urinary Tract: Adrenals appear normal bilaterally. There is no mass or hydronephrosis on either side. There is no renal or ureteral calculus on either side. Urinary bladder is midline with wall thickness within normal limits. Stomach/Bowel: There are multiple sigmoid diverticula without diverticulitis. There is no bowel wall or mesenteric thickening. There is no bowel obstruction. No free air or portal venous air. No bowel pneumatosis. Vascular/Lymphatic:  There is atherosclerotic calcification in the aorta and iliac arteries. No abdominal aortic aneurysm. There is moderate calcification in the proximal major mesenteric vessels. There is no demonstrable ascites in the abdomen or pelvis. Reproductive: The prostate and seminal vesicles appear unremarkable for age. There are several prostatic calculi. Other: Appendix appears normal. There is fat in each inguinal ring, somewhat more on the right than on the left. There is no abscess or ascites in the abdomen or pelvis. Musculoskeletal: There is degenerative change in the lumbar spine. There are no blastic or lytic bone lesions. There is no intramuscular or abdominal wall lesion. IMPRESSION: No bowel obstruction. No abscess. Multiple sigmoid diverticula without diverticulitis. Appendix appears normal. There are several prostatic calculi. There is no renal or ureteral calculus. No hydronephrosis. There is fairly extensive atherosclerotic calcification in the aorta and major mesenteric vessels. There is no bowel pneumatosis. No free air or bowel wall thickening. There is cholelithiasis without appreciable gallbladder wall thickening. There are multiple foci of coronary artery calcification. Electronically Signed   By: Lowella Grip III M.D.   On: 11/09/2015 14:57    Assessment & Plan:   [name] was seen today for follow-up, abdominal pain, fatigue, leg pain, insomnia and shoulder pain.  Diagnoses and all orders for this visit:  Lower abdominal pain -     DG Abd 2 Views; Future -     CBC with Differential/Platelet -     CMP14+EGFR -     POCT urinalysis dipstick   I am having Mr. [name] maintain his fish oil-omega-3 fatty acids, levothyroxine, CALCIUM PO, amLODipine, gabapentin, traMADol, lisinopril, hydrochlorothiazide, carvedilol, zolpidem, fenofibrate micronized, warfarin, and naproxen sodium.  No orders of the defined types were placed in this encounter.     Follow-up: Return in about 5 days (around  11/15/2015), or if symptoms worsen or fail to improve, for abd, shoulder pain.  Claretta Fraise, M.D.

## 2015-11-11 ENCOUNTER — Other Ambulatory Visit: Payer: Self-pay | Admitting: Family Medicine

## 2015-11-11 DIAGNOSIS — N179 Acute kidney failure, unspecified: Secondary | ICD-10-CM

## 2015-11-13 ENCOUNTER — Other Ambulatory Visit: Payer: Self-pay | Admitting: Family Medicine

## 2015-11-14 ENCOUNTER — Encounter: Payer: Self-pay | Admitting: *Deleted

## 2015-11-14 ENCOUNTER — Other Ambulatory Visit: Payer: Self-pay | Admitting: Family Medicine

## 2015-11-23 ENCOUNTER — Other Ambulatory Visit: Payer: Self-pay | Admitting: Family Medicine

## 2015-11-24 ENCOUNTER — Encounter: Payer: Self-pay | Admitting: Pharmacist

## 2015-11-24 ENCOUNTER — Ambulatory Visit (INDEPENDENT_AMBULATORY_CARE_PROVIDER_SITE_OTHER): Payer: Medicare Other | Admitting: Pharmacist

## 2015-11-24 ENCOUNTER — Telehealth: Payer: Self-pay | Admitting: Pharmacist

## 2015-11-24 VITALS — Ht 66.0 in | Wt 176.0 lb

## 2015-11-24 DIAGNOSIS — I482 Chronic atrial fibrillation, unspecified: Secondary | ICD-10-CM

## 2015-11-24 DIAGNOSIS — I480 Paroxysmal atrial fibrillation: Secondary | ICD-10-CM

## 2015-11-24 LAB — POCT INR: INR: 2.3

## 2015-11-24 MED ORDER — ZOLPIDEM TARTRATE 10 MG PO TABS: 5.0000 mg | ORAL_TABLET | Freq: Every day | ORAL | Status: DC

## 2015-11-24 NOTE — Telephone Encounter (Signed)
Authorize 30 days 1 refill. Scrip signed, on my desk.

## 2015-11-24 NOTE — Telephone Encounter (Signed)
Patient was requesting an increase in the dose. Do you wish to increase or leave the same?

## 2015-11-24 NOTE — Patient Instructions (Signed)
Anticoagulation Dose Instructions as of 11/24/2015      [name] Tue Wed Thu Fri Sat   New Dose 5 mg 7.5 mg 5 mg 5 mg 5 mg 7.5 mg 5 mg    Description        Continue current warfarin dose of 1 tablet daily except 1 and 1/2 on Monday and Fridays.       INR was 2.3 today

## 2015-12-04 DIAGNOSIS — N183 Chronic kidney disease, stage 3 (moderate): Secondary | ICD-10-CM | POA: Diagnosis not present

## 2015-12-04 DIAGNOSIS — R809 Proteinuria, unspecified: Secondary | ICD-10-CM | POA: Diagnosis not present

## 2015-12-04 DIAGNOSIS — N179 Acute kidney failure, unspecified: Secondary | ICD-10-CM | POA: Diagnosis not present

## 2015-12-04 DIAGNOSIS — I959 Hypotension, unspecified: Secondary | ICD-10-CM | POA: Diagnosis not present

## 2015-12-07 DIAGNOSIS — L570 Actinic keratosis: Secondary | ICD-10-CM | POA: Diagnosis not present

## 2015-12-07 DIAGNOSIS — Z85828 Personal history of other malignant neoplasm of skin: Secondary | ICD-10-CM | POA: Diagnosis not present

## 2015-12-22 ENCOUNTER — Ambulatory Visit: Payer: Self-pay | Admitting: Family Medicine

## 2015-12-25 ENCOUNTER — Other Ambulatory Visit: Payer: Self-pay | Admitting: Family Medicine

## 2015-12-26 ENCOUNTER — Encounter: Payer: Self-pay | Admitting: Family Medicine

## 2015-12-26 ENCOUNTER — Ambulatory Visit (INDEPENDENT_AMBULATORY_CARE_PROVIDER_SITE_OTHER): Payer: Medicare Other | Admitting: Family Medicine

## 2015-12-26 VITALS — BP 130/54 | HR 61 | Temp 97.1°F | Ht 66.0 in | Wt 178.0 lb

## 2015-12-26 DIAGNOSIS — E785 Hyperlipidemia, unspecified: Secondary | ICD-10-CM | POA: Diagnosis not present

## 2015-12-26 DIAGNOSIS — R296 Repeated falls: Secondary | ICD-10-CM | POA: Diagnosis not present

## 2015-12-26 DIAGNOSIS — Z7901 Long term (current) use of anticoagulants: Secondary | ICD-10-CM

## 2015-12-26 DIAGNOSIS — E039 Hypothyroidism, unspecified: Secondary | ICD-10-CM

## 2015-12-26 DIAGNOSIS — R7303 Prediabetes: Secondary | ICD-10-CM | POA: Diagnosis not present

## 2015-12-26 DIAGNOSIS — I480 Paroxysmal atrial fibrillation: Secondary | ICD-10-CM

## 2015-12-26 DIAGNOSIS — Z9181 History of falling: Secondary | ICD-10-CM

## 2015-12-26 DIAGNOSIS — I10 Essential (primary) hypertension: Secondary | ICD-10-CM | POA: Diagnosis not present

## 2015-12-26 LAB — COAGUCHEK XS/INR WAIVED
INR: 2.3 — ABNORMAL HIGH (ref 0.9–1.1)
Prothrombin Time: 27.7 s

## 2015-12-26 LAB — BAYER DCA HB A1C WAIVED: HB A1C: 6 % (ref <7.0)

## 2015-12-26 MED ORDER — HYDROCHLOROTHIAZIDE 25 MG PO TABS: 12.5000 mg | ORAL_TABLET | Freq: Every day | ORAL | Status: DC

## 2015-12-26 MED ORDER — TRAMADOL HCL 50 MG PO TABS: 50.0000 mg | ORAL_TABLET | Freq: Four times a day (QID) | ORAL | Status: DC | PRN

## 2015-12-26 MED ORDER — LISINOPRIL 40 MG PO TABS: 20.0000 mg | ORAL_TABLET | Freq: Every day | ORAL | Status: DC

## 2015-12-26 NOTE — Progress Notes (Signed)
Subjective:  Patient ID: [name], male    DOB: 1926/08/06  Age: 82 y.o. MRN: [id number]  CC: 3 month follow up   HPI [name] presents for k follow-up of hypertension. Patient has no history of headache chest pain or shortness of breath or recent cough. Patient also denies symptoms of TIA such as numbness weakness lateralizing. Patient checks  blood pressure at home and has not had any elevated readings recently. Patient denies side effects from his medication. States taking it regularly.   Patient presents for follow-up on  thyroid. He has a history of hypothyroidism for many years. It has been stable recently. Pt. denies any change in  voice, loss of hair, heat or cold intolerance. Energy level has been adequate to good. He denies constipation and diarrhea. No myxedema. Medication is as noted below. Verified that pt is taking it daily on an empty stomach. Well tolerated.   Patient in for follow-up of atrial fibrillation. Patient denies any recent bouts of chest pain or palpitations. Additionally, patient is taking anticoagulants. Patient denies any recent excessive bleeding episodes including epistaxis, bleeding from the gums, genitalia, rectal bleeding or hematuria. Additionally there has been no excessive bruising.  Patient in for follow-up of elevated cholesterol. Doing well without complaints on current medication. Denies side effects of statin including myalgia and arthralgia and nausea. Also in today for liver function testing. Currently no chest pain, shortness of breath or other cardiovascular related symptoms noted.  History [name] has a past medical history of Dilated cardiomyopathy ([place]); Atrial fibrillation ([place]); Drug therapy; CAD (coronary artery disease); Hypertension; Dyslipidemia; Hypothyroidism; and COPD (chronic obstructive pulmonary disease) ([place]).   He has past surgical history that includes Hip surgery (Left); Cardiac catheterization (05/2007); and Coronary  stent placement.   His family history includes Diabetes in his other; Heart attack in his father and mother; Stroke in his other. He was adopted.He reports that he has never smoked. He has never used smokeless tobacco. He reports that he does not drink alcohol or use illicit drugs.    ROS Review of Systems  Constitutional: Negative for fever, chills, diaphoresis and unexpected weight change.  HENT: Negative for congestion, hearing loss, rhinorrhea and sore throat.   Eyes: Negative for visual disturbance.  Respiratory: Negative for cough and shortness of breath.   Cardiovascular: Negative for chest pain.  Gastrointestinal: Negative for abdominal pain, diarrhea and constipation.  Genitourinary: Negative for dysuria and flank pain.  Musculoskeletal: Negative for joint swelling and arthralgias.  Skin: Negative for rash.  Neurological: Negative for dizziness and headaches.  Psychiatric/Behavioral: Negative for sleep disturbance and dysphoric mood.    Objective:  BP 130/54 mmHg  Pulse 61  Temp(Src) 97.1 F (36.2 C) (Oral)  Ht _0  (1.676 m)  Wt 178 lb (80.74 kg)  BMI 28.74 kg/m2  BP Readings from Last 3 Encounters:  12/26/15 130/54  11/10/15 95/61  11/09/15 165/75    Wt Readings from Last 3 Encounters:  12/26/15 178 lb (80.74 kg)  11/24/15 176 lb (79.833 kg)  11/10/15 174 lb 12.8 oz (79.289 kg)     Physical Exam  Constitutional: He is oriented to person, place, and time. He appears well-developed and well-nourished. No distress.  HENT:  Head: Normocephalic and atraumatic.  Right Ear: External ear normal.  Left Ear: External ear normal.  Nose: Nose normal.  Mouth/Throat: Oropharynx is clear and moist.  Eyes: Conjunctivae and EOM are normal. Pupils are equal, round, and reactive to light.  Neck: Normal range  of motion. Neck supple. No thyromegaly present.  Cardiovascular: Normal rate, regular rhythm and normal heart sounds.   No murmur heard. Pulmonary/Chest: Effort  normal and breath sounds normal. No respiratory distress. He has no wheezes. He has no rales.  Abdominal: Soft. Bowel sounds are normal. He exhibits no distension. There is no tenderness.  Lymphadenopathy:    He has no cervical adenopathy.  Neurological: He is alert and oriented to person, place, and time. He has normal reflexes.  Skin: Skin is warm and dry.  Psychiatric: He has a normal mood and affect. His behavior is normal. Judgment and thought content normal.     Lab Results  Component Value Date   WBC 6.4 11/10/2015   HGB 14.2 11/09/2015   HCT 41.5 11/10/2015   PLT 285 11/10/2015   GLUCOSE 137* 11/10/2015   CHOL 166 08/29/2014   TRIG 318* 08/29/2014   HDL 27* 08/29/2014   LDLCALC 75 08/29/2014   ALT 11 11/10/2015   AST 17 11/10/2015   NA 137 11/10/2015   K 4.5 11/10/2015   CL 103 11/10/2015   CREATININE 2.13* 11/10/2015   BUN 52* 11/10/2015   CO2 24 11/10/2015   TSH 0.382 03/26/2015   INR 2.3* 12/26/2015   HGBA1C 6.1 07/14/2015    US Abdomen Complete  11/09/2015  CLINICAL DATA:  Abdominal pain. EXAM: ABDOMEN ULTRASOUND COMPLETE COMPARISON:  02/17/2014. FINDINGS: Gallbladder: No gallstones or wall thickening visualized. No sonographic Murphy sign noted by sonographer. Common bile duct: Diameter: 5 mm Liver: No focal lesion identified. Within normal limits in parenchymal echogenicity. IVC: No abnormality visualized. Pancreas: Visualized portion unremarkable. Spleen: Size and appearance within normal limits. Small accessory spleen noted. Right Kidney: Length: 11.4 cm. Echogenicity within normal limits. No mass or hydronephrosis visualized. 7 mm nonobstructing lower pole calyceal stone . Left Kidney: Length: 11.2 cm. Echogenicity within normal limits. No mass or hydronephrosis visualized. 2.3 cm simple cyst upper pole Abdominal aorta: No aneurysm visualized. Other findings: None. IMPRESSION: 1.  No acute abnormality identified. 2.  7 mm lower renal pole nonobstructing caliceal  stone. Electronically Signed   By: Marcello Moores  Register   On: 11/09/2015 11:50   Dg Abd 2 Views  11/10/2015  CLINICAL DATA:  Lower abdominal pain EXAM: ABDOMEN - 2 VIEW COMPARISON:  Abdominal and pelvic CT scanning of November 09, 2015. FINDINGS: There small amounts of gas within minimally dilated small bowel to the left of midline. The stool and gas pattern within the colon is unremarkable. The bony structures exhibit no acute abnormalities. Known gallstones are not clearly evident. There are vascular calcifications in the region of the splenic and renal arteries. IMPRESSION: Mild gaseous distention of small-bowel loops to the left of midline. This may reflect an early ileus there is no evidence of obstruction or perforation. The colonic bowel gas pattern is normal. Electronically Signed   By: David  Martinique M.D.   On: 11/10/2015 13:39   Ct Renal Stone Study  11/09/2015  CLINICAL DATA:  Generalized abdominal pain for 1 week EXAM: CT ABDOMEN AND PELVIS WITHOUT CONTRAST TECHNIQUE: Multidetector CT imaging of the abdomen and pelvis was performed following the standard protocol without oral or intravenous contrast material administration. COMPARISON:  April 24, 2009 CT abdomen FINDINGS: Lower chest: There is scarring in portions of the right middle and lower lobe. There is mild fibrotic change in the inferomedial right base. No edema or consolidation is noted lung bases. There are multiple foci of coronary artery calcification as well as calcification in  the distal thoracic aorta. Hepatobiliary: No focal liver lesions are identified on this noncontrast enhanced study. There is cholelithiasis. The gallbladder wall is not appreciably thickened. There is no biliary duct dilatation. Pancreas: There is no pancreatic mass or inflammatory focus. Spleen: No splenic lesions are identified. There is a small splenule inferior to the spleen. Adrenals/Urinary Tract: Adrenals appear normal bilaterally. There is no mass or  hydronephrosis on either side. There is no renal or ureteral calculus on either side. Urinary bladder is midline with wall thickness within normal limits. Stomach/Bowel: There are multiple sigmoid diverticula without diverticulitis. There is no bowel wall or mesenteric thickening. There is no bowel obstruction. No free air or portal venous air. No bowel pneumatosis. Vascular/Lymphatic: There is atherosclerotic calcification in the aorta and iliac arteries. No abdominal aortic aneurysm. There is moderate calcification in the proximal major mesenteric vessels. There is no demonstrable ascites in the abdomen or pelvis. Reproductive: The prostate and seminal vesicles appear unremarkable for age. There are several prostatic calculi. Other: Appendix appears normal. There is fat in each inguinal ring, somewhat more on the right than on the left. There is no abscess or ascites in the abdomen or pelvis. Musculoskeletal: There is degenerative change in the lumbar spine. There are no blastic or lytic bone lesions. There is no intramuscular or abdominal wall lesion. IMPRESSION: No bowel obstruction. No abscess. Multiple sigmoid diverticula without diverticulitis. Appendix appears normal. There are several prostatic calculi. There is no renal or ureteral calculus. No hydronephrosis. There is fairly extensive atherosclerotic calcification in the aorta and major mesenteric vessels. There is no bowel pneumatosis. No free air or bowel wall thickening. There is cholelithiasis without appreciable gallbladder wall thickening. There are multiple foci of coronary artery calcification. Electronically Signed   By: Lowella Grip III M.D.   On: 11/09/2015 14:57    Assessment & Plan:   Beryle was seen today for 3 month follow up.  Diagnoses and all orders for this visit:  At high risk for falls  Hypothyroidism, unspecified hypothyroidism type -     CBC with Differential/Platelet -     CMP14+EGFR -     TSH + free  T4  Pre-diabetes -     CBC with Differential/Platelet -     CMP14+EGFR -     Bayer DCA Hb A1c Waived  Paroxysmal atrial fibrillation (HCC) -     CBC with Differential/Platelet -     CMP14+EGFR -     CoaguChek XS/INR Waived  Long term current use of anticoagulant therapy -     CBC with Differential/Platelet -     CMP14+EGFR -     CoaguChek XS/INR Waived  Essential hypertension -     CBC with Differential/Platelet -     CMP14+EGFR  Dyslipidemia -     CMP14+EGFR -     Lipid panel  Other orders -     lisinopril (PRINIVIL,ZESTRIL) 40 MG tablet; Take 0.5 tablets (20 mg total) by mouth daily. -     traMADol (ULTRAM) 50 MG tablet; Take 1 tablet (50 mg total) by mouth every 6 (six) hours as needed for moderate pain or severe pain. -     hydrochlorothiazide (HYDRODIURIL) 25 MG tablet; Take 0.5 tablets (12.5 mg total) by mouth daily.    I have discontinued Mr. Chandley naproxen sodium. I have also changed his lisinopril and hydrochlorothiazide. Additionally, I am having him maintain his fish oil-omega-3 fatty acids, CALCIUM PO, amLODipine, gabapentin, fenofibrate micronized, levothyroxine, NITROSTAT, carvedilol, zolpidem, warfarin, and  traMADol.  Meds ordered this encounter  Medications  . lisinopril (PRINIVIL,ZESTRIL) 40 MG tablet    Sig: Take 0.5 tablets (20 mg total) by mouth daily.    Dispense:  30 tablet    Refill:  3  . traMADol (ULTRAM) 50 MG tablet    Sig: Take 1 tablet (50 mg total) by mouth every 6 (six) hours as needed for moderate pain or severe pain.    Dispense:  50 tablet    Refill:  5  . hydrochlorothiazide (HYDRODIURIL) 25 MG tablet    Sig: Take 0.5 tablets (12.5 mg total) by mouth daily.    Dispense:  90 tablet    Refill:  1     Follow-up: Return in about 3 months (around 03/27/2016).  Claretta Fraise, M.D.

## 2015-12-27 LAB — LIPID PANEL
CHOL/HDL RATIO: 7.5 ratio — AB (ref 0.0–5.0)
Cholesterol, Total: 188 mg/dL (ref 100–199)
HDL: 25 mg/dL — AB (ref >39)
LDL CALC: 91 mg/dL (ref 0–99)
Triglycerides: 360 mg/dL — ABNORMAL HIGH (ref 0–149)
VLDL CHOLESTEROL CAL: 72 mg/dL — AB (ref 5–40)

## 2015-12-27 LAB — CMP14+EGFR
ALBUMIN: 4.3 g/dL (ref 3.5–4.7)
ALK PHOS: 51 IU/L (ref 39–117)
ALT: 10 IU/L (ref 0–44)
AST: 12 IU/L (ref 0–40)
Albumin/Globulin Ratio: 1.5 (ref 1.2–2.2)
BUN / CREAT RATIO: 25 — AB (ref 10–22)
BUN: 33 mg/dL — ABNORMAL HIGH (ref 8–27)
Bilirubin Total: 0.3 mg/dL (ref 0.0–1.2)
CALCIUM: 9.6 mg/dL (ref 8.6–10.2)
CO2: 22 mmol/L (ref 18–29)
CREATININE: 1.31 mg/dL — AB (ref 0.76–1.27)
Chloride: 99 mmol/L (ref 96–106)
GFR calc Af Amer: 56 mL/min/{1.73_m2} — ABNORMAL LOW (ref >59)
GFR, EST NON AFRICAN AMERICAN: 49 mL/min/{1.73_m2} — AB (ref >59)
GLOBULIN, TOTAL: 2.8 g/dL (ref 1.5–4.5)
Glucose: 153 mg/dL — ABNORMAL HIGH (ref 65–99)
Potassium: 4.2 mmol/L (ref 3.5–5.2)
SODIUM: 141 mmol/L (ref 134–144)
Total Protein: 7.1 g/dL (ref 6.0–8.5)

## 2015-12-27 LAB — CBC WITH DIFFERENTIAL/PLATELET
BASOS: 1 %
Basophils Absolute: 0 10*3/uL (ref 0.0–0.2)
EOS (ABSOLUTE): 0.2 10*3/uL (ref 0.0–0.4)
EOS: 4 %
HEMATOCRIT: 40.8 % (ref 37.5–51.0)
HEMOGLOBIN: 13.5 g/dL (ref 12.6–17.7)
Immature Grans (Abs): 0 10*3/uL (ref 0.0–0.1)
Immature Granulocytes: 0 %
LYMPHS ABS: 1.4 10*3/uL (ref 0.7–3.1)
Lymphs: 24 %
MCH: 27.2 pg (ref 26.6–33.0)
MCHC: 33.1 g/dL (ref 31.5–35.7)
MCV: 82 fL (ref 79–97)
MONOCYTES: 6 %
Monocytes Absolute: 0.4 10*3/uL (ref 0.1–0.9)
Neutrophils Absolute: 3.8 10*3/uL (ref 1.4–7.0)
Neutrophils: 65 %
Platelets: 309 10*3/uL (ref 150–379)
RBC: 4.97 x10E6/uL (ref 4.14–5.80)
RDW: 14.5 % (ref 12.3–15.4)
WBC: 5.8 10*3/uL (ref 3.4–10.8)

## 2015-12-27 LAB — TSH+FREE T4
Free T4: 1.6 ng/dL (ref 0.82–1.77)
TSH: 0.065 u[IU]/mL — ABNORMAL LOW (ref 0.450–4.500)

## 2016-01-03 ENCOUNTER — Emergency Department (HOSPITAL_COMMUNITY): Payer: Medicare Other

## 2016-01-03 ENCOUNTER — Emergency Department (HOSPITAL_COMMUNITY)
Admission: EM | Admit: 2016-01-03 | Discharge: 2016-01-03 | Disposition: A | Discharge status: Home / Self Care | Payer: Medicare Other | Attending: Emergency Medicine | Admitting: Emergency Medicine

## 2016-01-03 ENCOUNTER — Encounter (HOSPITAL_COMMUNITY): Payer: Self-pay | Admitting: Emergency Medicine

## 2016-01-03 ENCOUNTER — Other Ambulatory Visit: Payer: Self-pay | Admitting: Family Medicine

## 2016-01-03 DIAGNOSIS — J449 Chronic obstructive pulmonary disease, unspecified: Secondary | ICD-10-CM | POA: Diagnosis not present

## 2016-01-03 DIAGNOSIS — I4891 Unspecified atrial fibrillation: Secondary | ICD-10-CM | POA: Insufficient documentation

## 2016-01-03 DIAGNOSIS — M25561 Pain in right knee: Secondary | ICD-10-CM | POA: Diagnosis not present

## 2016-01-03 DIAGNOSIS — E785 Hyperlipidemia, unspecified: Secondary | ICD-10-CM | POA: Insufficient documentation

## 2016-01-03 DIAGNOSIS — Z79899 Other long term (current) drug therapy: Secondary | ICD-10-CM | POA: Diagnosis not present

## 2016-01-03 DIAGNOSIS — M7989 Other specified soft tissue disorders: Secondary | ICD-10-CM | POA: Diagnosis not present

## 2016-01-03 DIAGNOSIS — M1711 Unilateral primary osteoarthritis, right knee: Secondary | ICD-10-CM | POA: Diagnosis not present

## 2016-01-03 DIAGNOSIS — I10 Essential (primary) hypertension: Secondary | ICD-10-CM | POA: Diagnosis not present

## 2016-01-03 DIAGNOSIS — M1990 Unspecified osteoarthritis, unspecified site: Secondary | ICD-10-CM | POA: Diagnosis not present

## 2016-01-03 DIAGNOSIS — E039 Hypothyroidism, unspecified: Secondary | ICD-10-CM | POA: Insufficient documentation

## 2016-01-03 DIAGNOSIS — I2510 Atherosclerotic heart disease of native coronary artery without angina pectoris: Secondary | ICD-10-CM | POA: Diagnosis not present

## 2016-01-03 NOTE — ED Notes (Signed)
Swelling to left knee, rates pain 8/10.  Denies fall.

## 2016-01-03 NOTE — Discharge Instructions (Signed)
Osteoarthritis Osteoarthritis is a disease that causes soreness and inflammation of a joint. It occurs when the cartilage at the affected joint wears down. Cartilage acts as a cushion, covering the ends of bones where they meet to form a joint. Osteoarthritis is the most common form of arthritis. It often occurs in older people. The joints affected most often by this condition include those in the:  Ends of the fingers.  Thumbs.  Neck.  Lower back.  Knees.  Hips. CAUSES  Over time, the cartilage that covers the ends of bones begins to wear away. This causes bone to rub on bone, producing pain and stiffness in the affected joints.  RISK FACTORS Certain factors can increase your chances of having osteoarthritis, including:  Older age.  Excessive body weight.  Overuse of joints.  Previous joint injury. SIGNS AND SYMPTOMS   Pain, swelling, and stiffness in the joint.  Over time, the joint may lose its normal shape.  Small deposits of bone (osteophytes) may grow on the edges of the joint.  Bits of bone or cartilage can break off and float inside the joint space. This may cause more pain and damage. DIAGNOSIS  Your health care provider will do a physical exam and ask about your symptoms. Various tests may be ordered, such as:  X-rays of the affected joint.  Blood tests to rule out other types of arthritis. Additional tests may be used to diagnose your condition. TREATMENT  Goals of treatment are to control pain and improve joint function. Treatment plans may include:  A prescribed exercise program that allows for rest and joint relief.  A weight control plan.  Pain relief techniques, such as:  Properly applied heat and cold.  Electric pulses delivered to nerve endings under the skin (transcutaneous electrical nerve stimulation [TENS]).  Massage.  Certain nutritional supplements.  Medicines to control pain, such as:  Acetaminophen.  Nonsteroidal  anti-inflammatory drugs (NSAIDs), such as naproxen.  Narcotic or central-acting agents, such as tramadol.  Corticosteroids. These can be given orally or as an injection.  Surgery to reposition the bones and relieve pain (osteotomy) or to remove loose pieces of bone and cartilage. Joint replacement may be needed in advanced states of osteoarthritis. HOME CARE INSTRUCTIONS   Take medicines only as directed by your health care provider.  Maintain a healthy weight. Follow your health care provider's instructions for weight control. This may include dietary instructions.  Exercise as directed. Your health care provider can recommend specific types of exercise. These may include:  Strengthening exercises. These are done to strengthen the muscles that support joints affected by arthritis. They can be performed with weights or with exercise bands to add resistance.  Aerobic activities. These are exercises, such as brisk walking or low-impact aerobics, that get your heart pumping.  Range-of-motion activities. These keep your joints limber.  Balance and agility exercises. These help you maintain daily living skills.  Rest your affected joints as directed by your health care provider.  Keep all follow-up visits as directed by your health care provider. SEEK MEDICAL CARE IF:   Your skin turns red.  You develop a rash in addition to your joint pain.  You have worsening joint pain.  You have a fever along with joint or muscle aches. SEEK IMMEDIATE MEDICAL CARE IF:  You have a significant loss of weight or appetite.  You have night sweats. [place] of Arthritis and Musculoskeletal and Skin Diseases: www.niams.SouthExposed.es  National Institute on  Aging: http://kim-miller.com/  American College of Rheumatology: www.rheumatology.org   This information is not intended to replace advice given to you by your health care provider. Make sure you discuss any questions you  have with your health care provider.   Document Released: 09/30/2005 Document Revised: 10/21/2014 Document Reviewed: 06/07/2013 Elsevier Interactive Patient Education 2016 Elsevier [employer]. Knee Pain Knee pain is a very common symptom and can have many causes. Knee pain often goes away when you follow your health care provider's instructions for relieving pain and discomfort at home. However, knee pain can develop into a condition that needs treatment. Some conditions may include:  Arthritis caused by wear and tear (osteoarthritis).  Arthritis caused by swelling and irritation (rheumatoid arthritis or gout).  A cyst or growth in your knee.  An infection in your knee joint.  An injury that will not heal.  Damage, swelling, or irritation of the tissues that support your knee (torn ligaments or tendinitis). If your knee pain continues, additional tests may be ordered to diagnose your condition. Tests may include X-rays or other imaging studies of your knee. You may also need to have fluid removed from your knee. Treatment for ongoing knee pain depends on the cause, but treatment may include:  Medicines to relieve pain or swelling.  Steroid injections in your knee.  Physical therapy.  Surgery. HOME CARE INSTRUCTIONS  Take medicines only as directed by your health care provider.  Rest your knee and keep it raised (elevated) while you are resting.  Do not do things that cause or worsen pain.  Avoid high-impact activities or exercises, such as running, jumping rope, or doing jumping jacks.  Apply ice to the knee area:  Put ice in a plastic bag.  Place a towel between your skin and the bag.  Leave the ice on for 20 minutes, 2-3 times a day.  Ask your health care provider if you should wear an elastic knee support.  Keep a pillow under your knee when you sleep.  Lose weight if you are overweight. Extra weight can put pressure on your knee.  Do not use any tobacco products,  including cigarettes, chewing tobacco, or electronic cigarettes. If you need help quitting, ask your health care provider. Smoking may slow the healing of any bone and joint problems that you may have. SEEK MEDICAL CARE IF:  Your knee pain continues, changes, or gets worse.  You have a fever along with knee pain.  Your knee buckles or locks up.  Your knee becomes more swollen. SEEK IMMEDIATE MEDICAL CARE IF:   Your knee joint feels hot to the touch.  You have chest pain or trouble breathing.   This information is not intended to replace advice given to you by your health care provider. Make sure you discuss any questions you have with your health care provider.   Document Released: 07/28/2007 Document Revised: 10/21/2014 Document Reviewed: 05/16/2014 Elsevier Interactive Patient Education [employer].

## 2016-01-03 NOTE — ED Notes (Signed)
Ace wrap applied.  Verbalized understanding of dc instructions, and follow up care.
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
## 2016-01-10 ENCOUNTER — Emergency Department (HOSPITAL_COMMUNITY)
Admission: EM | Admit: 2016-01-10 | Discharge: 2016-01-10 | Disposition: A | Discharge status: Home / Self Care | Payer: Medicare Other | Attending: Emergency Medicine | Admitting: Emergency Medicine

## 2016-01-10 ENCOUNTER — Encounter (HOSPITAL_COMMUNITY): Payer: Self-pay | Admitting: *Deleted

## 2016-01-10 ENCOUNTER — Emergency Department (HOSPITAL_COMMUNITY): Payer: Medicare Other

## 2016-01-10 DIAGNOSIS — M25562 Pain in left knee: Secondary | ICD-10-CM | POA: Diagnosis present

## 2016-01-10 DIAGNOSIS — M109 Gout, unspecified: Secondary | ICD-10-CM | POA: Diagnosis not present

## 2016-01-10 DIAGNOSIS — E559 Vitamin D deficiency, unspecified: Secondary | ICD-10-CM | POA: Diagnosis not present

## 2016-01-10 DIAGNOSIS — J449 Chronic obstructive pulmonary disease, unspecified: Secondary | ICD-10-CM | POA: Insufficient documentation

## 2016-01-10 DIAGNOSIS — I10 Essential (primary) hypertension: Secondary | ICD-10-CM | POA: Diagnosis not present

## 2016-01-10 DIAGNOSIS — M179 Osteoarthritis of knee, unspecified: Secondary | ICD-10-CM | POA: Diagnosis not present

## 2016-01-10 DIAGNOSIS — M7989 Other specified soft tissue disorders: Secondary | ICD-10-CM | POA: Diagnosis not present

## 2016-01-10 DIAGNOSIS — E785 Hyperlipidemia, unspecified: Secondary | ICD-10-CM | POA: Insufficient documentation

## 2016-01-10 DIAGNOSIS — E039 Hypothyroidism, unspecified: Secondary | ICD-10-CM | POA: Diagnosis not present

## 2016-01-10 DIAGNOSIS — I2510 Atherosclerotic heart disease of native coronary artery without angina pectoris: Secondary | ICD-10-CM | POA: Insufficient documentation

## 2016-01-10 DIAGNOSIS — N183 Chronic kidney disease, stage 3 (moderate): Secondary | ICD-10-CM | POA: Diagnosis not present

## 2016-01-10 DIAGNOSIS — R809 Proteinuria, unspecified: Secondary | ICD-10-CM | POA: Diagnosis not present

## 2016-01-10 DIAGNOSIS — M25532 Pain in left wrist: Secondary | ICD-10-CM | POA: Diagnosis not present

## 2016-01-10 DIAGNOSIS — D519 Vitamin B12 deficiency anemia, unspecified: Secondary | ICD-10-CM | POA: Diagnosis not present

## 2016-01-10 LAB — SYNOVIAL CELL COUNT + DIFF, W/ CRYSTALS
Eosinophils-Synovial: 0 % (ref 0–1)
Lymphocytes-Synovial Fld: 47 % — ABNORMAL HIGH (ref 0–20)
Monocyte-Macrophage-Synovial Fluid: 4 % — ABNORMAL LOW (ref 50–90)
Neutrophil, Synovial: 49 % — ABNORMAL HIGH (ref 0–25)
OTHER CELLS-SYN: 0
WBC, Synovial: 7143 /mm3 — ABNORMAL HIGH (ref 0–200)

## 2016-01-10 LAB — GRAM STAIN

## 2016-01-10 MED ORDER — METHYLPREDNISOLONE ACETATE 80 MG/ML IJ SUSP
40.0000 mg | Freq: Once | INTRAMUSCULAR | Status: AC
  Administered 2016-01-10: 40 mg via INTRA_ARTICULAR
  Filled 2016-01-10 (×2): qty 1

## 2016-01-10 MED ORDER — BUPIVACAINE HCL (PF) 0.5 % IJ SOLN
10.0000 mL | Freq: Once | INTRAMUSCULAR | Status: AC
  Administered 2016-01-10: 10 mL
  Filled 2016-01-10: qty 30

## 2016-01-10 MED ORDER — INDOMETHACIN 25 MG PO CAPS: 25.0000 mg | ORAL_CAPSULE | Freq: Three times a day (TID) | ORAL | Status: DC | PRN

## 2016-01-10 NOTE — ED Notes (Signed)
Pt made aware to return if symptoms worsen or if any life threatening symptoms occur.   

## 2016-01-10 NOTE — ED Notes (Signed)
Pt c/o left knee pain, states he was here recently and told he has arthritis. States pain is worse and wants to know can he have a shot for pain.

## 2016-01-10 NOTE — Discharge Instructions (Signed)
Indocin as prescribed.  Continue taking your tramadol as needed for pain.  Return to the emergency department if symptoms significantly worsen or change.   Gout Gout is an inflammatory arthritis caused by a buildup of uric acid crystals in the joints. Uric acid is a chemical that is normally present in the blood. When the level of uric acid in the blood is too high it can form crystals that deposit in your joints and tissues. This causes joint redness, soreness, and swelling (inflammation). Repeat attacks are common. Over time, uric acid crystals can form into masses (tophi) near a joint, destroying bone and causing disfigurement. Gout is treatable and often preventable. CAUSES  The disease begins with elevated levels of uric acid in the blood. Uric acid is produced by your body when it breaks down a naturally found substance called purines. Certain foods you eat, such as meats and fish, contain high amounts of purines. Causes of an elevated uric acid level include:  Being passed down from parent to child (heredity).  Diseases that cause increased uric acid production (such as obesity, psoriasis, and certain cancers).  Excessive alcohol use.  Diet, especially diets rich in meat and seafood.  Medicines, including certain cancer-fighting medicines (chemotherapy), water pills (diuretics), and aspirin.  Chronic kidney disease. The kidneys are no longer able to remove uric acid well.  Problems with metabolism. Conditions strongly associated with gout include:  Obesity.  High blood pressure.  High cholesterol.  Diabetes. Not everyone with elevated uric acid levels gets gout. It is not understood why some people get gout and others do not. Surgery, joint injury, and eating too much of certain foods are some of the factors that can lead to gout attacks. SYMPTOMS   An attack of gout comes on quickly. It causes intense pain with redness, swelling, and warmth in a joint.  Fever can  occur.  Often, only one joint is involved. Certain joints are more commonly involved:  Base of the big toe.  Knee.  Ankle.  Wrist.  Finger. Without treatment, an attack usually goes away in a few days to weeks. Between attacks, you usually will not have symptoms, which is different from many other forms of arthritis. DIAGNOSIS  Your caregiver will suspect gout based on your symptoms and exam. In some cases, tests may be recommended. The tests may include:  Blood tests.  Urine tests.  X-rays.  Joint fluid exam. This exam requires a needle to remove fluid from the joint (arthrocentesis). Using a microscope, gout is confirmed when uric acid crystals are seen in the joint fluid. TREATMENT  There are two phases to gout treatment: treating the sudden onset (acute) attack and preventing attacks (prophylaxis).  Treatment of an Acute Attack.  Medicines are used. These include anti-inflammatory medicines or steroid medicines.  An injection of steroid medicine into the affected joint is sometimes necessary.  The painful joint is rested. Movement can worsen the arthritis.  You may use warm or cold treatments on painful joints, depending which works best for you.  Treatment to Prevent Attacks.  If you suffer from frequent gout attacks, your caregiver may advise preventive medicine. These medicines are started after the acute attack subsides. These medicines either help your kidneys eliminate uric acid from your body or decrease your uric acid production. You may need to stay on these medicines for a very long time.  The early phase of treatment with preventive medicine can be associated with an increase in acute gout attacks. For this  reason, during the first few months of treatment, your caregiver may also advise you to take medicines usually used for acute gout treatment. Be sure you understand your caregiver's directions. Your caregiver may make several adjustments to your medicine  dose before these medicines are effective.  Discuss dietary treatment with your caregiver or dietitian. Alcohol and drinks high in sugar and fructose and foods such as meat, poultry, and seafood can increase uric acid levels. Your caregiver or dietitian can advise you on drinks and foods that should be limited. HOME CARE INSTRUCTIONS   Do not take aspirin to relieve pain. This raises uric acid levels.  Only take over-the-counter or prescription medicines for pain, discomfort, or fever as directed by your caregiver.  Rest the joint as much as possible. When in bed, keep sheets and blankets off painful areas.  Keep the affected joint raised (elevated).  Apply warm or cold treatments to painful joints. Use of warm or cold treatments depends on which works best for you.  Use crutches if the painful joint is in your leg.  Drink enough fluids to keep your urine clear or pale yellow. This helps your body get rid of uric acid. Limit alcohol, sugary drinks, and fructose drinks.  Follow your dietary instructions. Pay careful attention to the amount of protein you eat. Your daily diet should emphasize fruits, vegetables, whole grains, and fat-free or low-fat milk products. Discuss the use of coffee, vitamin C, and cherries with your caregiver or dietitian. These may be helpful in lowering uric acid levels.  Maintain a healthy body weight. SEEK MEDICAL CARE IF:   You develop diarrhea, vomiting, or any side effects from medicines.  You do not feel better in 24 hours, or you are getting worse. SEEK IMMEDIATE MEDICAL CARE IF:   Your joint becomes suddenly more tender, and you have chills or a fever. MAKE SURE YOU:   Understand these instructions.  Will watch your condition.  Will get help right away if you are not doing well or get worse.   This information is not intended to replace advice given to you by your health care provider. Make sure you discuss any questions you have with your health  care provider.   Document Released: 09/27/2000 Document Revised: 10/21/2014 Document Reviewed: 05/13/2012 Elsevier Interactive Patient Education [employer].

## 2016-01-10 NOTE — ED Provider Notes (Signed)
CSN: [id number]     Arrival date & time 01/10/16  1118 History   First MD Initiated Contact with Patient 01/10/16 1446     Chief Complaint  Patient presents with  . Knee Pain     (Consider location/radiation/quality/duration/timing/severity/associated sxs/prior Treatment) HPI Comments: Patient is an 82 year old male with history of atrial fibrillation, cardiomyopathy, coronary artery disease. He presents for evaluation of left knee pain. This began 1 week ago in the absence of any injury or trauma. He was seen here and diagnosed with arthritis, however has worsened. He reports swelling in the left knee that is making it difficult for him to ambulate.  Patient is a 82 y.o. male presenting with knee pain. The history is provided by the patient.  Knee Pain Location:  Knee Knee location:  L knee Pain details:    Quality:  Sharp   Radiates to:  Does not radiate   Severity:  Severe   Onset quality:  Gradual   Duration:  1 week   Timing:  Constant   Progression:  Worsening Chronicity:  New Relieved by:  Nothing Worsened by:  Nothing tried Ineffective treatments:  None tried   Past Medical History  Diagnosis Date  . Dilated cardiomyopathy ([place])     Felt to be secondary to atrial fibrillation. EF is about 40%  . Atrial fibrillation ([facility])     Persistent, treated with amiodarone and cardioversion  . Drug therapy     Chronic Coumadin therapy  . CAD (coronary artery disease)     Last catheterization in August 2008 demonstrated the LAD 40% stenosis  . Hypertension   . Dyslipidemia   . COPD (chronic obstructive pulmonary disease) ([place])   . Hypothyroidism     Possibly related to amiodarone   Past Surgical History  Procedure Laterality Date  . Hip surgery Left     8yo  . Cardiac catheterization  05/2007    LAD 40% stenosis. Stent in the LAD had 20% in-stent restenosis. Followed by 40-50% distal stenosis. 50% diagonal stenosis. 40-50% circumflex stenosis. 70% OM1 stenosis and 30%  ostial RCA stenosis. 30% proximal RCA stenosis.  . Coronary stent placement     Family History  Problem Relation Age of Onset  . Adopted: Yes  . Heart attack Mother   . Heart attack Father   . Diabetes Other   . Stroke Other    Social History  Substance Use Topics  . Smoking status: Never Smoker   . Smokeless tobacco: Never Used  . Alcohol Use: No     Comment: quit at age 24yo    Review of Systems  All other systems reviewed and are negative.     Allergies  Amiodarone and Penicillins  Home Medications   Prior to Admission medications   Medication Sig Start Date End Date Taking? Authorizing Provider  amLODipine (NORVASC) 10 MG tablet TAKE 1 TABLET ONCE A DAY 05/15/15   Minus Breeding, MD  CALCIUM PO Take 1 tablet by mouth daily.    Historical Provider, MD  carvedilol (COREG) 12.5 MG tablet TAKE (1) TABLET TWICE A DAY. 11/23/15   Claretta Fraise, MD  fenofibrate micronized (LOFIBRA) 134 MG capsule TAKE (1) CAPSULE DAILY BEFORE BREAKFAST. 01/03/16   Claretta Fraise, MD  fish oil-omega-3 fatty acids 1000 MG capsule Take 2 g by mouth daily.    Historical Provider, MD  gabapentin (NEURONTIN) 600 MG tablet Take 2 tablets (1,200 mg total) by mouth 2 (two) times daily. For burning in legs 06/09/15  Claretta Fraise, MD  hydrochlorothiazide (HYDRODIURIL) 25 MG tablet Take 0.5 tablets (12.5 mg total) by mouth daily. 12/26/15   Claretta Fraise, MD  levothyroxine (SYNTHROID, LEVOTHROID) 150 MCG tablet TAKE 1 TABLET DAILY 11/13/15   Claretta Fraise, MD  lisinopril (PRINIVIL,ZESTRIL) 40 MG tablet Take 0.5 tablets (20 mg total) by mouth daily. 12/26/15   Claretta Fraise, MD  NITROSTAT 0.4 MG SL tablet Place 1 tablet (0.4 mg total) under the tongue every 5 (five) minutesas needed for chest pain. 11/14/15   Claretta Fraise, MD  traMADol (ULTRAM) 50 MG tablet Take 1 tablet (50 mg total) by mouth every 6 (six) hours as needed for moderate pain or severe pain. 12/26/15   Claretta Fraise, MD  warfarin (COUMADIN) 5 MG  tablet Take 5 mg once daily except on Monday and Friday take 7.5 mg. 12/25/15   Claretta Fraise, MD  zolpidem (AMBIEN) 10 MG tablet Take 0.5 tablets (5 mg total) by mouth at bedtime. 11/24/15   Claretta Fraise, MD   BP 126/85 mmHg  Pulse 64  Temp(Src) 97.8 F (36.6 C) (Temporal)  Resp 16  Ht 5\' 6"  (1.676 m)  Wt 177 lb (80.287 kg)  BMI 28.58 kg/m2  SpO2 95% Physical Exam  Constitutional: He is oriented to person, place, and time. He appears well-developed and well-nourished. No distress.  HENT:  Head: Normocephalic and atraumatic.  Neck: Normal range of motion. Neck supple.  Musculoskeletal:  The left knee has a moderate sized effusion. There is some pain with range of motion, however there is no erythema, or warmth.  Neurological: He is alert and oriented to person, place, and time.  Skin: Skin is warm and dry. He is not diaphoretic.  Nursing note and vitals reviewed.   ED Course  .Joint Aspiration/Arthrocentesis Date/Time: 01/10/2016 5:05 PM Performed by: Veryl Speak Authorized by: Veryl Speak Consent: Verbal consent obtained. Risks and benefits: risks, benefits and alternatives were discussed Consent given by: patient Patient understanding: patient states understanding of the procedure being performed Patient consent: the patient's understanding of the procedure matches consent given Imaging studies: imaging studies available Patient identity confirmed: verbally with patient Time out: Immediately prior to procedure a "time out" was called to verify the correct patient, procedure, equipment, support staff and site/side marked as required. Indications: joint swelling,  pain,  diagnostic evaluation and possible septic joint  Location: Right knee. Local anesthesia used: yes Anesthesia: local infiltration Local anesthetic: bupivacaine 0.5% without epinephrine Anesthetic total: 3 ml Patient sedated: no Preparation: Patient was prepped and draped in the usual sterile  fashion. Needle gauge: 18 G Ultrasound guidance: no Approach: medial Aspirate: blood-tinged Aspirate amount: 30 mL Methylprednisolone amount: 80 mg Bupivacaine 0.50% amount: 3 ml Patient tolerance: Patient tolerated the procedure well with no immediate complications   (including critical care time) Labs Review Labs Reviewed - No data to display  Imaging Review No results found. I have personally reviewed and evaluated these images and lab results as part of my medical decision-making.    MDM   Final diagnoses:  None    Arthrocentesis was performed and fluid sent to the lab. It is showing inflammation rather than infection and also evidence for monosodium urate crystals consistent with gout. He will be treated with Indocin and is to follow-up with his primary Dr. if not improving.    Veryl Speak, MD 01/10/16 [phone]

## 2016-01-15 LAB — CULTURE, BODY FLUID-BOTTLE: CULTURE: NO GROWTH

## 2016-01-15 LAB — CULTURE, BODY FLUID W GRAM STAIN -BOTTLE

## 2016-01-28 ENCOUNTER — Emergency Department (HOSPITAL_COMMUNITY): Payer: Medicare Other

## 2016-01-28 ENCOUNTER — Emergency Department (HOSPITAL_COMMUNITY)
Admission: EM | Admit: 2016-01-28 | Discharge: 2016-01-28 | Disposition: A | Discharge status: Home / Self Care | Payer: Medicare Other | Attending: Emergency Medicine | Admitting: Emergency Medicine

## 2016-01-28 ENCOUNTER — Encounter (HOSPITAL_COMMUNITY): Payer: Self-pay | Admitting: Emergency Medicine

## 2016-01-28 DIAGNOSIS — E785 Hyperlipidemia, unspecified: Secondary | ICD-10-CM | POA: Diagnosis not present

## 2016-01-28 DIAGNOSIS — E039 Hypothyroidism, unspecified: Secondary | ICD-10-CM | POA: Diagnosis not present

## 2016-01-28 DIAGNOSIS — I10 Essential (primary) hypertension: Secondary | ICD-10-CM | POA: Insufficient documentation

## 2016-01-28 DIAGNOSIS — R531 Weakness: Secondary | ICD-10-CM | POA: Diagnosis not present

## 2016-01-28 DIAGNOSIS — I2510 Atherosclerotic heart disease of native coronary artery without angina pectoris: Secondary | ICD-10-CM | POA: Insufficient documentation

## 2016-01-28 DIAGNOSIS — Z7901 Long term (current) use of anticoagulants: Secondary | ICD-10-CM | POA: Diagnosis not present

## 2016-01-28 DIAGNOSIS — J449 Chronic obstructive pulmonary disease, unspecified: Secondary | ICD-10-CM | POA: Diagnosis not present

## 2016-01-28 DIAGNOSIS — I4891 Unspecified atrial fibrillation: Secondary | ICD-10-CM | POA: Diagnosis not present

## 2016-01-28 DIAGNOSIS — Z79899 Other long term (current) drug therapy: Secondary | ICD-10-CM | POA: Diagnosis not present

## 2016-01-28 DIAGNOSIS — R5383 Other fatigue: Secondary | ICD-10-CM | POA: Diagnosis not present

## 2016-01-28 DIAGNOSIS — I6782 Cerebral ischemia: Secondary | ICD-10-CM | POA: Diagnosis not present

## 2016-01-28 LAB — URINE MICROSCOPIC-ADD ON
RBC / HPF: NONE SEEN RBC/hpf (ref 0–5)
SQUAMOUS EPITHELIAL / LPF: NONE SEEN
WBC, UA: NONE SEEN WBC/hpf (ref 0–5)

## 2016-01-28 LAB — HEPATIC FUNCTION PANEL
ALK PHOS: 47 U/L (ref 38–126)
ALT: 16 U/L — AB (ref 17–63)
AST: 22 U/L (ref 15–41)
Albumin: 4 g/dL (ref 3.5–5.0)
Bilirubin, Direct: 0.1 mg/dL (ref 0.1–0.5)
Indirect Bilirubin: 0.5 mg/dL (ref 0.3–0.9)
TOTAL PROTEIN: 7.7 g/dL (ref 6.5–8.1)
Total Bilirubin: 0.6 mg/dL (ref 0.3–1.2)

## 2016-01-28 LAB — BASIC METABOLIC PANEL
Anion gap: 10 (ref 5–15)
BUN: 23 mg/dL — ABNORMAL HIGH (ref 6–20)
CALCIUM: 8.8 mg/dL — AB (ref 8.9–10.3)
CO2: 24 mmol/L (ref 22–32)
CREATININE: 1.16 mg/dL (ref 0.61–1.24)
Chloride: 106 mmol/L (ref 101–111)
GFR, EST NON AFRICAN AMERICAN: 54 mL/min — AB (ref >60)
GLUCOSE: 131 mg/dL — AB (ref 65–99)
Potassium: 3.9 mmol/L (ref 3.5–5.1)
Sodium: 140 mmol/L (ref 135–145)

## 2016-01-28 LAB — URINALYSIS, ROUTINE W REFLEX MICROSCOPIC
BILIRUBIN URINE: NEGATIVE
GLUCOSE, UA: NEGATIVE mg/dL
HGB URINE DIPSTICK: NEGATIVE
Ketones, ur: NEGATIVE mg/dL
Leukocytes, UA: NEGATIVE
Nitrite: NEGATIVE
SPECIFIC GRAVITY, URINE: 1.01 (ref 1.005–1.030)
pH: 6.5 (ref 5.0–8.0)

## 2016-01-28 LAB — CBC
HCT: 39.2 % (ref 39.0–52.0)
Hemoglobin: 12.7 g/dL — ABNORMAL LOW (ref 13.0–17.0)
MCH: 26.2 pg (ref 26.0–34.0)
MCHC: 32.4 g/dL (ref 30.0–36.0)
MCV: 81 fL (ref 78.0–100.0)
PLATELETS: 216 10*3/uL (ref 150–400)
RBC: 4.84 MIL/uL (ref 4.22–5.81)
RDW: 14 % (ref 11.5–15.5)
WBC: 5.4 10*3/uL (ref 4.0–10.5)

## 2016-01-28 LAB — PROTIME-INR
INR: 2 — ABNORMAL HIGH (ref 0.00–1.49)
Prothrombin Time: 22.6 seconds — ABNORMAL HIGH (ref 11.6–15.2)

## 2016-01-28 LAB — CBG MONITORING, ED: GLUCOSE-CAPILLARY: 120 mg/dL — AB (ref 65–99)

## 2016-01-28 LAB — TROPONIN I

## 2016-01-28 LAB — I-STAT CG4 LACTIC ACID, ED: LACTIC ACID, VENOUS: 1.27 mmol/L (ref 0.5–2.0)

## 2016-01-28 MED ORDER — ACETAMINOPHEN 325 MG PO TABS
650.0000 mg | ORAL_TABLET | Freq: Once | ORAL | Status: AC
  Administered 2016-01-28: 650 mg via ORAL
  Filled 2016-01-28: qty 2

## 2016-01-28 MED ORDER — SODIUM CHLORIDE 0.9 % IV SOLN
INTRAVENOUS | Status: DC
  Administered 2016-01-28: 10:00:00 via INTRAVENOUS

## 2016-01-28 NOTE — ED Notes (Signed)
MD at bedside. 

## 2016-01-28 NOTE — ED Notes (Signed)
PT ambulated with stand by assist at this time with NAD noted.

## 2016-01-28 NOTE — ED Notes (Signed)
Patient states that he has been experiencing weakness and joint pain since yesterday morning.  He states that he appears flushed compared to normal as well.  Pt A&Ox4.

## 2016-01-28 NOTE — ED Provider Notes (Signed)
CSN: [id number]     Arrival date & time 01/28/16  [id number] History   First MD Initiated Contact with Patient 01/28/16 [phone]     Chief Complaint  Patient presents with  . Weakness      HPI Pt was seen at 0950. Per pt's family and pt, c/o gradual onset and persistence of constant generalized weakness/fatigue since yesterday. Has been associated with generalized body "aches." Pt's family states pt "couldn't hardly walk" today due to his weakness (pt is very active at baseline). Denies focal motor weakness, no tingling/numbness in extremities, no facial droop, no slurred speech, no abd pain, no N/V/D, no back pain, no CP/palpitations, no cough/SOB, no syncope/near syncope, no fevers, no rash, no known tick bites.     Past Medical History  Diagnosis Date  . Dilated cardiomyopathy ([place])     Felt to be secondary to atrial fibrillation. EF is about 40%  . Atrial fibrillation ([facility])     Persistent, treated with amiodarone and cardioversion  . Drug therapy     Chronic Coumadin therapy  . CAD (coronary artery disease)     Last catheterization in August 2008 demonstrated the LAD 40% stenosis  . Hypertension   . Dyslipidemia   . COPD (chronic obstructive pulmonary disease) ([place])   . Hypothyroidism     Possibly related to amiodarone   Past Surgical History  Procedure Laterality Date  . Hip surgery Left     8yo  . Cardiac catheterization  05/2007    LAD 40% stenosis. Stent in the LAD had 20% in-stent restenosis. Followed by 40-50% distal stenosis. 50% diagonal stenosis. 40-50% circumflex stenosis. 70% OM1 stenosis and 30% ostial RCA stenosis. 30% proximal RCA stenosis.  . Coronary stent placement     Family History  Problem Relation Age of Onset  . Adopted: Yes  . Heart attack Mother   . Heart attack Father   . Diabetes Other   . Stroke Other    Social History  Substance Use Topics  . Smoking status: Never Smoker   . Smokeless tobacco: Never Used  . Alcohol Use: No     Comment: quit at  age 37yo    Review of Systems ROS: Statement: All systems negative except as marked or noted in the HPI; Constitutional: Negative for fever and chills. +generalized weakness/fatigue/body aches.; ; Eyes: Negative for eye pain, redness and discharge. ; ; ENMT: Negative for ear pain, hoarseness, nasal congestion, sinus pressure and sore throat. ; ; Cardiovascular: Negative for chest pain, palpitations, diaphoresis, dyspnea and peripheral edema. ; ; Respiratory: Negative for cough, wheezing and stridor. ; ; Gastrointestinal: Negative for nausea, vomiting, diarrhea, abdominal pain, blood in stool, hematemesis, jaundice and rectal bleeding. . ; ; Genitourinary: Negative for dysuria, flank pain and hematuria. ; ; Musculoskeletal: Negative for back pain and neck pain. Negative for swelling and trauma.; ; Skin: Negative for pruritus, rash, abrasions, blisters, bruising and skin lesion.; ; Neuro: Negative for headache, lightheadedness and neck stiffness. Negative for altered level of consciousness , altered mental status, extremity weakness, paresthesias, involuntary movement, seizure and syncope.      Allergies  Amiodarone and Penicillins  Home Medications   Prior to Admission medications   Medication Sig Start Date End Date Taking? Authorizing Provider  amLODipine (NORVASC) 10 MG tablet TAKE 1 TABLET ONCE A DAY 05/15/15   Minus Breeding, MD  CALCIUM PO Take 1 tablet by mouth daily.    Historical Provider, MD  carvedilol (COREG) 12.5 MG tablet TAKE (1) TABLET  TWICE A DAY. 11/23/15   Claretta Fraise, MD  fenofibrate micronized (LOFIBRA) 134 MG capsule TAKE (1) CAPSULE DAILY BEFORE BREAKFAST. 01/03/16   Claretta Fraise, MD  fish oil-omega-3 fatty acids 1000 MG capsule Take 2 g by mouth daily.    Historical Provider, MD  gabapentin (NEURONTIN) 600 MG tablet Take 2 tablets (1,200 mg total) by mouth 2 (two) times daily. For burning in legs 06/09/15   Claretta Fraise, MD  hydrochlorothiazide (HYDRODIURIL) 25 MG tablet  Take 0.5 tablets (12.5 mg total) by mouth daily. 12/26/15   Claretta Fraise, MD  indomethacin (INDOCIN) 25 MG capsule Take 1 capsule (25 mg total) by mouth 3 (three) times daily as needed. 01/10/16   Veryl Speak, MD  levothyroxine (SYNTHROID, LEVOTHROID) 150 MCG tablet TAKE 1 TABLET DAILY 11/13/15   Claretta Fraise, MD  lisinopril (PRINIVIL,ZESTRIL) 40 MG tablet Take 0.5 tablets (20 mg total) by mouth daily. 12/26/15   Claretta Fraise, MD  NITROSTAT 0.4 MG SL tablet Place 1 tablet (0.4 mg total) under the tongue every 5 (five) minutesas needed for chest pain. 11/14/15   Claretta Fraise, MD  traMADol (ULTRAM) 50 MG tablet Take 1 tablet (50 mg total) by mouth every 6 (six) hours as needed for moderate pain or severe pain. 12/26/15   Claretta Fraise, MD  warfarin (COUMADIN) 5 MG tablet Take 5 mg once daily except on Monday and Friday take 7.5 mg. 12/25/15   Claretta Fraise, MD  zolpidem (AMBIEN) 10 MG tablet Take 0.5 tablets (5 mg total) by mouth at bedtime. 11/24/15   Claretta Fraise, MD   BP 178/67 mmHg  Pulse 61  Temp(Src) 98.6 F (37 C) (Rectal)  Resp 25  Ht 5\' 6"  (1.676 m)  Wt 177 lb (80.287 kg)  BMI 28.58 kg/m2  SpO2 95%   10:19 Orthostatic Vital Signs KS  Orthostatic Lying  - BP- Lying: 172/70 mmHg ; Pulse- Lying: 64  Orthostatic Sitting - BP- Sitting: 176/57 mmHg ; Pulse- Sitting: 68  Orthostatic Standing at 0 minutes - BP- Standing at 0 minutes: 155/62 mmHg ; Pulse- Standing at 0 minutes: 70     Patient Vitals for the past 24 hrs:  BP Temp Temp src Pulse Resp SpO2 Height Weight  01/28/16 1145 178/69 mmHg 98.3 F (36.8 C) Oral 60 18 98 % - -  01/28/16 1130 178/69 mmHg - - 60 - 98 % - -  01/28/16 1100 172/68 mmHg - - (!) 58 20 97 % - -  01/28/16 1030 179/69 mmHg - - 60 22 97 % - -  01/28/16 1006 - 98.6 F (37 C) Rectal - - - - -  01/28/16 1000 178/67 mmHg - - 61 25 95 % - -  01/28/16 0948 176/86 mmHg 98.4 F (36.9 C) Oral 64 20 96 % 5\' 6"  (1.676 m) 177 lb (80.287 kg)     Physical Exam   0955: Physical examination:  Nursing notes reviewed; Vital signs and O2 SAT reviewed;  Constitutional: Well developed, Well nourished, Well hydrated, In no acute distress; Head:  Normocephalic, atraumatic; Eyes: EOMI, PERRL, No scleral icterus; ENMT: Mouth and pharynx normal, Mucous membranes moist; Neck: Supple, no meningeal signs. Full range of motion, No lymphadenopathy; Cardiovascular: Irregular irregular rate and rhythm, No gallop; Respiratory: Breath sounds clear & equal bilaterally, No wheezes.  Speaking full sentences with ease, Normal respiratory effort/excursion; Chest: Nontender, Movement normal; Abdomen: Soft, Nontender, Nondistended, Normal bowel sounds; Genitourinary: No CVA tenderness; Extremities: Pulses normal, No tenderness, No edema, No calf edema or  asymmetry.; Neuro: AA&Ox3, Major CN grossly intact. No facial droop. Speech clear. Grips equal. Strength 5/5 equal bilat UE's and LE's. No gross focal motor or sensory deficits in extremities.; Skin: Color normal, Warm, Dry. No rash.   ED Course  Procedures (including critical care time) Labs Review  Imaging Review  I have personally reviewed and evaluated these images and lab results as part of my medical decision-making.   EKG Interpretation   Date/Time:  Sunday January 28 2016 09:48:23 EDT Ventricular Rate:  63 PR Interval:  237 QRS Duration: 142 QT Interval:  483 QTC Calculation: 494 R Axis:   -108 Text Interpretation:  Sinus rhythm Prolonged PR interval Nonspecific IVCD  with LAD Probable lateral infarct, age indeterminate Anterior infarct, old  Possible Leads misplaced Suggest follow up tracing Confirmed by [facility]   MD, Nunzio Cory [phone]) on 01/28/2016 10:01:15 AM      EKG Interpretation  Date/Time:  Sunday January 28 2016 10:03:30 EDT Ventricular Rate:  60 PR Interval:  230 QRS Duration: 148 QT Interval:  464 QTC Calculation: 464 R Axis:   39 Text Interpretation:  Sinus rhythm Multiple premature complexes, vent  & supraven Prolonged PR interval IVCD, consider atypical LBBB When compared with ECG of 11/09/2015 No significant change was found Confirmed by [facility]  MD, Nunzio Cory [phone]) on 01/28/2016 10:11:29 AM        MDM  MDM Reviewed: previous chart, nursing note and vitals Reviewed previous: labs and ECG Interpretation: labs, ECG, x-ray and CT scan    Results for orders placed or performed during the hospital encounter of [id number]  Basic metabolic panel  Result Value Ref Range   Sodium 140 135 - 145 mmol/L   Potassium 3.9 3.5 - 5.1 mmol/L   Chloride 106 101 - 111 mmol/L   CO2 24 22 - 32 mmol/L   Glucose, Bld 131 (H) 65 - 99 mg/dL   BUN 23 (H) 6 - 20 mg/dL   Creatinine, Ser 1.16 0.61 - 1.24 mg/dL   Calcium 8.8 (L) 8.9 - 10.3 mg/dL   GFR calc non Af Amer 54 (L) >60 mL/min   GFR calc Af Amer >60 >60 mL/min   Anion gap 10 5 - 15  CBC  Result Value Ref Range   WBC 5.4 4.0 - 10.5 K/uL   RBC 4.84 4.22 - 5.81 MIL/uL   Hemoglobin 12.7 (L) 13.0 - 17.0 g/dL   HCT 39.2 39.0 - 52.0 %   MCV 81.0 78.0 - 100.0 fL   MCH 26.2 26.0 - 34.0 pg   MCHC 32.4 30.0 - 36.0 g/dL   RDW 14.0 11.5 - 15.5 %   Platelets 216 150 - 400 K/uL  Urinalysis, Routine w reflex microscopic (not at [facility])  Result Value Ref Range   Color, Urine YELLOW YELLOW   APPearance CLEAR CLEAR   Specific Gravity, Urine 1.010 1.005 - 1.030   pH 6.5 5.0 - 8.0   Glucose, UA NEGATIVE NEGATIVE mg/dL   Hgb urine dipstick NEGATIVE NEGATIVE   Bilirubin Urine NEGATIVE NEGATIVE   Ketones, ur NEGATIVE NEGATIVE mg/dL   Protein, ur TRACE (A) NEGATIVE mg/dL   Nitrite NEGATIVE NEGATIVE   Leukocytes, UA NEGATIVE NEGATIVE  Protime-INR  Result Value Ref Range   Prothrombin Time 22.6 (H) 11.6 - 15.2 seconds   INR 2.00 (H) 0.00 - 1.49  Troponin I  Result Value Ref Range   Troponin I <0.03 <0.031 ng/mL  Hepatic function panel  Result Value Ref Range   Total Protein 7.7 6.5 -  8.1 g/dL   Albumin 4.0 3.5 - 5.0 g/dL   AST 22 15 - 41 U/L   ALT  16 (L) 17 - 63 U/L   Alkaline Phosphatase 47 38 - 126 U/L   Total Bilirubin 0.6 0.3 - 1.2 mg/dL   Bilirubin, Direct 0.1 0.1 - 0.5 mg/dL   Indirect Bilirubin 0.5 0.3 - 0.9 mg/dL  Urine microscopic-add on  Result Value Ref Range   Squamous Epithelial / LPF NONE SEEN NONE SEEN   WBC, UA NONE SEEN 0 - 5 WBC/hpf   RBC / HPF NONE SEEN 0 - 5 RBC/hpf   Bacteria, UA RARE (A) NONE SEEN  CBG monitoring, ED  Result Value Ref Range   Glucose-Capillary 120 (H) 65 - 99 mg/dL  I-Stat CG4 Lactic Acid, ED  Result Value Ref Range   Lactic Acid, Venous 1.27 0.5 - 2.0 mmol/L   Dg Chest 2 View 01/28/2016  CLINICAL DATA:  Weakness.  Left arm pain. EXAM: CHEST  2 VIEW COMPARISON:  Previous examinations, the most recent dated 03/26/2015. FINDINGS: Normal sized heart. Clear lungs. Left nipple shadow, bilateral nipple shadows, unchanged since 05/20/2007. Diffuse osteopenia. IMPRESSION: No acute abnormality. Electronically Signed   By: Claudie Revering M.D.   On: 01/28/2016 10:55   Ct Head Wo Contrast 01/28/2016  CLINICAL DATA:  Weakness since yesterday morning. EXAM: CT HEAD WITHOUT CONTRAST TECHNIQUE: Contiguous axial images were obtained from the base of the skull through the vertex without intravenous contrast. COMPARISON:  03/26/2015. FINDINGS: Diffusely enlarged ventricles and subarachnoid spaces. Stable third ventricular colloid cyst. No intracranial hemorrhage, mass lesion or CT evidence of acute infarction. Stable bilateral inferior mastoid sclerosis and mucosal thickening involving some of the mastoid air cells on the right. The included paranasal sinuses are normally pneumatized. IMPRESSION: 1. No acute abnormality. 2. Stable mild diffuse cerebral and cerebellar atrophy and third ventricular colloid cyst. Electronically Signed   By: Claudie Revering M.D.   On: 01/28/2016 11:02    1215:  Pt is not orthostatic. No fever while in the ED. Pt has tol PO well while in the ED without N/V.  No stooling while in the ED.  Neuro  exam remain intact and unchanged, VSS. Pt has ambulated with steady gait, easy resps, NAD. Continues to deny specific complaint; no CP/SOB/abd pain/focal weakness. No clear indication for admission at this time.  Pt states he wants to go home now. Dx and testing d/w pt and family.  Questions answered.  Verb understanding, agreeable to d/c home with outpt f/u.     Francine Graven, DO 02/01/16 [phone]

## 2016-01-28 NOTE — Discharge Instructions (Signed)
Take your usual prescriptions as previously directed.  Take over the counter tylenol, as directed on packaging, as needed for discomfort. Call your regular medical doctor tomorrow to schedule a follow up appointment within the next 1 to 2 days.  Return to the Emergency Department immediately sooner if worsening.  ° °

## 2016-01-30 ENCOUNTER — Ambulatory Visit (INDEPENDENT_AMBULATORY_CARE_PROVIDER_SITE_OTHER): Payer: Medicare Other | Admitting: Family Medicine

## 2016-01-30 ENCOUNTER — Encounter: Payer: Self-pay | Admitting: Family Medicine

## 2016-01-30 VITALS — BP 127/62 | HR 64 | Temp 97.1°F | Ht 66.0 in | Wt 172.6 lb

## 2016-01-30 DIAGNOSIS — Z7901 Long term (current) use of anticoagulants: Secondary | ICD-10-CM

## 2016-01-30 DIAGNOSIS — I10 Essential (primary) hypertension: Secondary | ICD-10-CM | POA: Diagnosis not present

## 2016-01-30 DIAGNOSIS — R7303 Prediabetes: Secondary | ICD-10-CM | POA: Diagnosis not present

## 2016-01-30 DIAGNOSIS — R0989 Other specified symptoms and signs involving the circulatory and respiratory systems: Secondary | ICD-10-CM

## 2016-01-30 DIAGNOSIS — I480 Paroxysmal atrial fibrillation: Secondary | ICD-10-CM

## 2016-01-30 LAB — COAGUCHEK XS/INR WAIVED
INR: 2.6 — AB (ref 0.9–1.1)
PROTHROMBIN TIME: 31.1 s

## 2016-01-30 LAB — BAYER DCA HB A1C WAIVED: HB A1C (BAYER DCA - WAIVED): 6.4 % (ref <7.0)

## 2016-01-30 LAB — GLUCOSE HEMOCUE WAIVED: GLU HEMOCUE WAIVED: 140 mg/dL — AB (ref 65–99)

## 2016-01-30 NOTE — Progress Notes (Signed)
Subjective:  Patient ID: [name], male    DOB: [birth year]  Age: 82 y.o. MRN: [id number]  CC: Hospitalization Follow-up   HPI Ilijah Frappier presents for generalized weakness and fatigue starting 3 days ago. Went to E.D. 2 days ago. CT of head negative for acute change although cerebral atrophy was noted. CXR unremarkable . Blood work unremarkable. Pt. Also felt pain and cramping in BLE that continues. Also still feels weak. No respiratory sx. Denies dyspnea, dysuria. He did a lot of mowing the day before onset. Outside at [facility] at onset. Denied chest pain. Taking coumadin for his A.fib.    History Shamus has a past medical history of Dilated cardiomyopathy ([place]); Atrial fibrillation ([place]); Drug therapy; CAD (coronary artery disease); Hypertension; Dyslipidemia; COPD (chronic obstructive pulmonary disease) ([place]); and Hypothyroidism.   He has past surgical history that includes Hip surgery (Left); Cardiac catheterization (05/2007); and Coronary stent placement.   His family history includes Diabetes in his other; Heart attack in his father and mother; Stroke in his other. He was adopted.He reports that he has never smoked. He has never used smokeless tobacco. He reports that he does not drink alcohol or use illicit drugs.    ROS Review of Systems  Constitutional: Positive for fatigue. Negative for fever, chills, diaphoresis and unexpected weight change.  HENT: Negative for congestion, hearing loss, rhinorrhea and sore throat.   Eyes: Negative for visual disturbance.  Respiratory: Negative for cough and shortness of breath.   Cardiovascular: Negative for chest pain.  Gastrointestinal: Negative for abdominal pain, diarrhea and constipation.  Genitourinary: Negative for dysuria and flank pain.  Musculoskeletal: Negative for joint swelling and arthralgias.  Skin: Negative for rash.  Neurological: Positive for weakness (nonfocal). Negative for dizziness and headaches.    Psychiatric/Behavioral: Negative for sleep disturbance and dysphoric mood.    Objective:  BP 127/62 mmHg  Pulse 64  Temp(Src) 97.1 F (36.2 C) (Oral)  Ht 5\' 6"  (1.676 m)  Wt 172 lb 9.6 oz (78.291 kg)  BMI 27.87 kg/m2  SpO2 96%  BP Readings from Last 3 Encounters:  01/30/16 127/62  01/28/16 178/69  01/10/16 145/67    Wt Readings from Last 3 Encounters:  01/30/16 172 lb 9.6 oz (78.291 kg)  01/28/16 177 lb (80.287 kg)  01/10/16 177 lb (80.287 kg)     Physical Exam  Constitutional: He is oriented to person, place, and time. He appears well-developed and well-nourished. No distress.  HENT:  Head: Normocephalic and atraumatic.  Right Ear: External ear normal.  Left Ear: External ear normal.  Nose: Nose normal.  Mouth/Throat: Oropharynx is clear and moist.  Eyes: Conjunctivae and EOM are normal. Pupils are equal, round, and reactive to light.  Neck: Normal range of motion. Neck supple. No thyromegaly present.  Cardiovascular: Normal rate, regular rhythm and normal heart sounds.   No murmur heard. Pulmonary/Chest: Effort normal and breath sounds normal. No respiratory distress. He has no wheezes. He has no rales.  Abdominal: Soft. Bowel sounds are normal. He exhibits no distension. There is no tenderness.  Lymphadenopathy:    He has no cervical adenopathy.  Neurological: He is alert and oriented to person, place, and time. He has normal reflexes.  Skin: Skin is warm and dry.  Psychiatric: He has a normal mood and affect. His behavior is normal. Judgment and thought content normal.     Lab Results  Component Value Date   WBC 5.4 01/28/2016   HGB 12.7* 01/28/2016   HCT 39.2 01/28/2016  PLT 216 01/28/2016   GLUCOSE 131* 01/28/2016   CHOL 188 12/26/2015   TRIG 360* 12/26/2015   HDL 25* 12/26/2015   LDLCALC 91 12/26/2015   ALT 16* 01/28/2016   AST 22 01/28/2016   NA 140 01/28/2016   K 3.9 01/28/2016   CL 106 01/28/2016   CREATININE 1.16 01/28/2016   BUN 23*  01/28/2016   CO2 24 01/28/2016   TSH 0.065* 12/26/2015   INR 2.00* 01/28/2016   HGBA1C 6.1 07/14/2015    Dg Chest 2 View  01/28/2016  CLINICAL DATA:  Weakness.  Left arm pain. EXAM: CHEST  2 VIEW COMPARISON:  Previous examinations, the most recent dated 03/26/2015. FINDINGS: Normal sized heart. Clear lungs. Left nipple shadow, bilateral nipple shadows, unchanged since 05/20/2007. Diffuse osteopenia. IMPRESSION: No acute abnormality. Electronically Signed   By: Claudie Revering M.D.   On: 01/28/2016 10:55   Ct Head Wo Contrast  01/28/2016  CLINICAL DATA:  Weakness since yesterday morning. EXAM: CT HEAD WITHOUT CONTRAST TECHNIQUE: Contiguous axial images were obtained from the base of the skull through the vertex without intravenous contrast. COMPARISON:  03/26/2015. FINDINGS: Diffusely enlarged ventricles and subarachnoid spaces. Stable third ventricular colloid cyst. No intracranial hemorrhage, mass lesion or CT evidence of acute infarction. Stable bilateral inferior mastoid sclerosis and mucosal thickening involving some of the mastoid air cells on the right. The included paranasal sinuses are normally pneumatized. IMPRESSION: 1. No acute abnormality. 2. Stable mild diffuse cerebral and cerebellar atrophy and third ventricular colloid cyst. Electronically Signed   By: Claudie Revering M.D.   On: 01/28/2016 11:02    Assessment & Plan:   Erwin was seen today for hospitalization follow-up.  Diagnoses and all orders for this visit:  Bruit  Essential hypertension  Long term current use of anticoagulant therapy -     CoaguChek XS/INR Waived  Paroxysmal atrial fibrillation (HCC) -     CoaguChek XS/INR Waived  Pre-diabetes -     Glucose Hemocue Waived -     Bayer DCA Hb A1c Waived    Knee pain - tylenol. No NSAID. Pt. Declined metformin. Prefers trial of diet.  I am having Mr. Gentz maintain his fish oil-omega-3 fatty acids, CALCIUM PO, amLODipine, gabapentin, levothyroxine, NITROSTAT,  carvedilol, zolpidem, warfarin, lisinopril, traMADol, hydrochlorothiazide, fenofibrate micronized, and indomethacin.  No orders of the defined types were placed in this encounter.     Follow-up: Return in about 3 months (around 04/30/2016), or if symptoms worsen or fail to improve.  Claretta Fraise, M.D.

## 2016-01-30 NOTE — Patient Instructions (Signed)

## 2016-01-31 ENCOUNTER — Encounter: Payer: Self-pay | Admitting: Pharmacist

## 2016-01-31 DIAGNOSIS — N183 Chronic kidney disease, stage 3 (moderate): Secondary | ICD-10-CM | POA: Diagnosis not present

## 2016-01-31 DIAGNOSIS — I10 Essential (primary) hypertension: Secondary | ICD-10-CM | POA: Diagnosis not present

## 2016-01-31 DIAGNOSIS — N179 Acute kidney failure, unspecified: Secondary | ICD-10-CM | POA: Diagnosis not present

## 2016-02-01 ENCOUNTER — Encounter: Payer: Self-pay | Admitting: Pharmacist

## 2016-02-06 ENCOUNTER — Other Ambulatory Visit: Payer: Self-pay

## 2016-02-06 MED ORDER — INDOMETHACIN 25 MG PO CAPS: 25.0000 mg | ORAL_CAPSULE | Freq: Three times a day (TID) | ORAL | Status: DC | PRN

## 2016-02-14 ENCOUNTER — Other Ambulatory Visit: Payer: Self-pay | Admitting: Family Medicine

## 2016-02-14 NOTE — Telephone Encounter (Signed)
Tramadol last filled 12/26/2015. Patient last seen 01/30/2016

## 2016-02-19 ENCOUNTER — Other Ambulatory Visit: Payer: Self-pay | Admitting: Family Medicine

## 2016-03-04 ENCOUNTER — Telehealth: Payer: Self-pay | Admitting: *Deleted

## 2016-03-04 ENCOUNTER — Ambulatory Visit (INDEPENDENT_AMBULATORY_CARE_PROVIDER_SITE_OTHER): Payer: Medicare Other

## 2016-03-04 ENCOUNTER — Ambulatory Visit (INDEPENDENT_AMBULATORY_CARE_PROVIDER_SITE_OTHER): Payer: Medicare Other | Admitting: Family Medicine

## 2016-03-04 ENCOUNTER — Other Ambulatory Visit: Payer: Self-pay | Admitting: Family Medicine

## 2016-03-04 ENCOUNTER — Encounter: Payer: Self-pay | Admitting: Family Medicine

## 2016-03-04 VITALS — BP 105/53 | HR 66 | Temp 97.1°F | Ht 66.0 in | Wt 170.0 lb

## 2016-03-04 DIAGNOSIS — M25562 Pain in left knee: Secondary | ICD-10-CM

## 2016-03-04 DIAGNOSIS — I10 Essential (primary) hypertension: Secondary | ICD-10-CM

## 2016-03-04 DIAGNOSIS — I482 Chronic atrial fibrillation, unspecified: Secondary | ICD-10-CM

## 2016-03-04 DIAGNOSIS — M170 Bilateral primary osteoarthritis of knee: Secondary | ICD-10-CM

## 2016-03-04 DIAGNOSIS — R52 Pain, unspecified: Secondary | ICD-10-CM

## 2016-03-04 DIAGNOSIS — M25561 Pain in right knee: Secondary | ICD-10-CM | POA: Diagnosis not present

## 2016-03-04 DIAGNOSIS — Z7901 Long term (current) use of anticoagulants: Secondary | ICD-10-CM

## 2016-03-04 DIAGNOSIS — M129 Arthropathy, unspecified: Secondary | ICD-10-CM

## 2016-03-04 DIAGNOSIS — M25532 Pain in left wrist: Secondary | ICD-10-CM

## 2016-03-04 MED ORDER — ESCITALOPRAM OXALATE 5 MG PO TABS: 5.0000 mg | ORAL_TABLET | Freq: Every day | ORAL | Status: DC

## 2016-03-04 MED ORDER — HYDROCHLOROTHIAZIDE 12.5 MG PO CAPS: 12.5000 mg | ORAL_CAPSULE | Freq: Every day | ORAL | Status: DC

## 2016-03-04 MED ORDER — CELECOXIB 200 MG PO CAPS: 200.0000 mg | ORAL_CAPSULE | Freq: Every day | ORAL | Status: DC

## 2016-03-04 NOTE — Progress Notes (Signed)
Subjective:  Patient ID: [name], male    DOB: 04/05/1926  Age: 82 y.o. MRN: [id number]  CC: Knee Pain and Wrist Pain   HPI [name] presents for Bilateral pain in the knees. This is been increasing for several weeks. It is now 6-7/10 almost constant but increasing with ambulation. Difficult to climb  steps. He also has a vague sensation of aching going down the left forearm. It is on the ventral side. It seems to be centered in the wrist itself as well as the distal forearm. It has not decreased his strength or range of motion. It has been ongoing for several months off and on.   Patient in for follow-up of atrial fibrillation. Patient denies any recent bouts of chest pain or palpitations. Additionally, patient is taking anticoagulants. Patient denies any recent excessive bleeding episodes including epistaxis, bleeding from the gums, genitalia, rectal bleeding or hematuria. Additionally there has been no excessive bruising.   follow-up of hypertension. Patient has no history of headache chest pain or shortness of breath or recent cough. Patient also denies symptoms of TIA such as numbness weakness lateralizing. Patient checks  blood pressure at home and has not had any elevated readings recently. Patient denies side effects from his medication. States taking it regularly.    History [name] has a past medical history of Dilated cardiomyopathy ([place]); Atrial fibrillation ([place]); Drug therapy; CAD (coronary artery disease); Hypertension; Dyslipidemia; COPD (chronic obstructive pulmonary disease) ([place]); and Hypothyroidism.   He has past surgical history that includes Hip surgery (Left); Cardiac catheterization (05/2007); and Coronary stent placement.   His family history includes Diabetes in his other; Heart attack in his father and mother; Stroke in his other. He was adopted.He reports that he has never smoked. He has never used smokeless tobacco. He reports that he does not drink alcohol  or use illicit drugs.    ROS Review of Systems  Constitutional: Negative for fever, chills, diaphoresis and unexpected weight change.  HENT: Negative for congestion, hearing loss, rhinorrhea and sore throat.   Eyes: Negative for visual disturbance.  Respiratory: Negative for cough and shortness of breath.   Cardiovascular: Negative for chest pain.  Gastrointestinal: Negative for abdominal pain, diarrhea and constipation.  Genitourinary: Negative for dysuria and flank pain.  Musculoskeletal: Negative for joint swelling and arthralgias.  Skin: Negative for rash.  Neurological: Negative for dizziness and headaches.  Psychiatric/Behavioral: Negative for sleep disturbance and dysphoric mood.    Objective:  BP 105/53 mmHg  Pulse 66  Temp(Src) 97.1 F (36.2 C) (Oral)  Ht 5\' 6"  (1.676 m)  Wt 170 lb (77.111 kg)  BMI 27.45 kg/m2  SpO2 97%  BP Readings from Last 3 Encounters:  03/04/16 105/53  01/30/16 127/62  01/28/16 178/69    Wt Readings from Last 3 Encounters:  03/04/16 170 lb (77.111 kg)  01/30/16 172 lb 9.6 oz (78.291 kg)  01/28/16 177 lb (80.287 kg)     Physical Exam  Constitutional: He is oriented to person, place, and time. He appears well-developed and well-nourished. No distress.  HENT:  Head: Normocephalic and atraumatic.  Right Ear: External ear normal.  Left Ear: External ear normal.  Nose: Nose normal.  Mouth/Throat: Oropharynx is clear and moist.  Eyes: Conjunctivae and EOM are normal. Pupils are equal, round, and reactive to light.  Neck: Normal range of motion. Neck supple. No thyromegaly present.  Cardiovascular: Normal rate, regular rhythm and normal heart sounds.   No murmur heard. Pulmonary/Chest: Effort normal and breath  sounds normal. No respiratory distress. He has no wheezes. He has no rales.  Abdominal: Soft. Bowel sounds are normal. He exhibits no distension. There is no tenderness.  Lymphadenopathy:    He has no cervical adenopathy.    Neurological: He is alert and oriented to person, place, and time. He has normal reflexes.  Skin: Skin is warm and dry.  Psychiatric: He has a normal mood and affect. His behavior is normal. Judgment and thought content normal.     Lab Results  Component Value Date   WBC 5.4 01/28/2016   HGB 12.7* 01/28/2016   HCT 39.2 01/28/2016   PLT 216 01/28/2016   GLUCOSE 131* 01/28/2016   CHOL 188 12/26/2015   TRIG 360* 12/26/2015   HDL 25* 12/26/2015   LDLCALC 91 12/26/2015   ALT 16* 01/28/2016   AST 22 01/28/2016   NA 140 01/28/2016   K 3.9 01/28/2016   CL 106 01/28/2016   CREATININE 1.16 01/28/2016   BUN 23* 01/28/2016   CO2 24 01/28/2016   TSH 0.065* 12/26/2015   INR 2.6* 01/30/2016   HGBA1C 6.1 07/14/2015     Assessment & Plan:   [name] was seen today for knee pain and wrist pain.  Diagnoses and all orders for this visit:  Essential hypertension  Long term current use of anticoagulant therapy  Arthritis of both knees  Chronic atrial fibrillation (HCC)  Wrist pain, acute, left  Other orders -     hydrochlorothiazide (MICROZIDE) 12.5 MG capsule; Take 1 capsule (12.5 mg total) by mouth daily. For fluid, BP -     celecoxib (CELEBREX) 200 MG capsule; Take 1 capsule (200 mg total) by mouth daily. With food    A steroid injection was performed at each knee, lateral approach using 1% plain Lidocaine and 9 mg of Celestone in each. This was well tolerated.  Take gabapentin 600 mg one at bedtime for 1 week. Then increase to one twice a day for one week. Then increase to one in the morning and two in the evening. This should relieve the arm pain.  Add celebrex once a day for joint pain. Discontinue indomethacin  I have discontinued Mr. Walsingham hydrochlorothiazide and indomethacin. I am also having him start on hydrochlorothiazide and celecoxib. Additionally, I am having him maintain his fish oil-omega-3 fatty acids, CALCIUM PO, amLODipine, levothyroxine, NITROSTAT,  carvedilol, zolpidem, fenofibrate micronized, gabapentin, traMADol, warfarin, and lisinopril.  Meds ordered this encounter  Medications  . lisinopril (PRINIVIL,ZESTRIL) 20 MG tablet    Sig: Take 1 tablet by mouth daily.  . hydrochlorothiazide (MICROZIDE) 12.5 MG capsule    Sig: Take 1 capsule (12.5 mg total) by mouth daily. For fluid, BP    Dispense:  90 capsule    Refill:  3  . celecoxib (CELEBREX) 200 MG capsule    Sig: Take 1 capsule (200 mg total) by mouth daily. With food    Dispense:  30 capsule    Refill:  5     Follow-up: Return in about 3 months (around 06/04/2016) for CPE.  Claretta Fraise, M.D.

## 2016-03-04 NOTE — Telephone Encounter (Signed)
Thesubstitute has been sent to the pharmacy.  Please let the patient know. Thanks, WS

## 2016-03-04 NOTE — Telephone Encounter (Signed)
Per pt, Cymbalta is too expensive Please review and advise

## 2016-03-04 NOTE — Patient Instructions (Signed)
Take gabapentin 600 mg one at bedtime for 1 week. Then increase to one twice a day for one week. Then increase to one in the morning and two in the evening. This should relieve the arm pain.  Add celebrex once a day for joint pain. Discontinue indomethacin

## 2016-03-06 ENCOUNTER — Ambulatory Visit (INDEPENDENT_AMBULATORY_CARE_PROVIDER_SITE_OTHER): Payer: Medicare Other | Admitting: Pharmacist

## 2016-03-06 ENCOUNTER — Encounter: Payer: Self-pay | Admitting: Pharmacist

## 2016-03-06 VITALS — BP 102/60 | HR 58 | Ht 66.0 in | Wt 169.0 lb

## 2016-03-06 DIAGNOSIS — I480 Paroxysmal atrial fibrillation: Secondary | ICD-10-CM | POA: Diagnosis not present

## 2016-03-06 DIAGNOSIS — G4700 Insomnia, unspecified: Secondary | ICD-10-CM

## 2016-03-06 DIAGNOSIS — Z0000 Encounter for general adult medical examination without abnormal findings: Secondary | ICD-10-CM

## 2016-03-06 DIAGNOSIS — Z1211 Encounter for screening for malignant neoplasm of colon: Secondary | ICD-10-CM

## 2016-03-06 DIAGNOSIS — E119 Type 2 diabetes mellitus without complications: Secondary | ICD-10-CM

## 2016-03-06 LAB — COAGUCHEK XS/INR WAIVED
INR: 3.4 — ABNORMAL HIGH (ref 0.9–1.1)
PROTHROMBIN TIME: 40.3 s

## 2016-03-06 NOTE — Progress Notes (Signed)
Patient ID: [name], male   DOB: Jul 08, 1926, 82 y.o.   MRN: [id number]    Subjective:   [name] is a 82 y.o. male who presents for a subsequent Medicare Annual Wellness Visit and to check protime.  Review of Systems  Review of Systems  Constitutional: Negative.   HENT: Negative.   Eyes: Negative.   Respiratory: Negative.   Cardiovascular: Negative.   Gastrointestinal: Negative.   Genitourinary: Negative.   Musculoskeletal: Positive for joint pain.  Skin: Negative.   Neurological: Positive for tingling (in hands - reports improvement with gabapentin).  Endo/Heme/Allergies: Negative.   Psychiatric/Behavioral: The patient has insomnia (improved but reports a lot of worry due to daughter's financial situation).        Current Medications (verified) Outpatient Encounter Prescriptions as of 03/06/2016  Medication Sig  . amLODipine (NORVASC) 10 MG tablet TAKE 1 TABLET ONCE A DAY  . CALCIUM PO Take 1 tablet by mouth daily.  . carvedilol (COREG) 12.5 MG tablet TAKE (1) TABLET TWICE A DAY.  . celecoxib (CELEBREX) 200 MG capsule Take 1 capsule (200 mg total) by mouth daily. With food  . escitalopram (LEXAPRO) 5 MG tablet Take 1 tablet (5 mg total) by mouth daily.  . fenofibrate micronized (LOFIBRA) 134 MG capsule TAKE (1) CAPSULE DAILY BEFORE BREAKFAST.  . fish oil-omega-3 fatty acids 1000 MG capsule Take 2 g by mouth daily.  Marland Kitchen gabapentin (NEURONTIN) 600 MG tablet Take 1 tablet (600 mg total) by mouth 3 (three) times daily.  . hydrochlorothiazide (MICROZIDE) 12.5 MG capsule Take 1 capsule (12.5 mg total) by mouth daily. For fluid, BP  . levothyroxine (SYNTHROID, LEVOTHROID) 150 MCG tablet TAKE 1 TABLET DAILY  . lisinopril (PRINIVIL,ZESTRIL) 20 MG tablet Take 1 tablet by mouth daily.  Marland Kitchen NITROSTAT 0.4 MG SL tablet Place 1 tablet (0.4 mg total) under the tongue every 5 (five) minutesas needed for chest pain.  . traMADol (ULTRAM) 50 MG tablet TAKE 1 TABLET EVERY 6 HOURS AS NEEDED FOR  MODERATE TO SEVERE PAIN  . warfarin (COUMADIN) 5 MG tablet Take 5 mg once daily except on Monday and Friday take 7.5 mg.  . zolpidem (AMBIEN) 10 MG tablet Take 0.5 tablets (5 mg total) by mouth at bedtime. (Patient taking differently: Take 10 mg by mouth at bedtime. )   No facility-administered encounter medications on file as of 03/06/2016.    Allergies (verified) Amiodarone and Penicillins   History: Past Medical History  Diagnosis Date  . Dilated cardiomyopathy ([place])     Felt to be secondary to atrial fibrillation. EF is about 40%  . Atrial fibrillation (HCC)     Persistent, treated with amiodarone and cardioversion  . Drug therapy     Chronic Coumadin therapy  . CAD (coronary artery disease)     Last catheterization in August 2008 demonstrated the LAD 40% stenosis  . Hypertension   . Dyslipidemia   . COPD (chronic obstructive pulmonary disease) ([place])   . Hypothyroidism     Possibly related to amiodarone   Past Surgical History  Procedure Laterality Date  . Hip surgery Left     8yo  . Cardiac catheterization  05/2007    LAD 40% stenosis. Stent in the LAD had 20% in-stent restenosis. Followed by 40-50% distal stenosis. 50% diagonal stenosis. 40-50% circumflex stenosis. 70% OM1 stenosis and 30% ostial RCA stenosis. 30% proximal RCA stenosis.  . Coronary stent placement     Family History  Problem Relation Age of Onset  . Adopted: Yes  .  Heart attack Mother   . Heart attack Father   . Diabetes Other   . Stroke Other    Social History   Occupational History  . Part Time    Social History Main Topics  . Smoking status: Never Smoker   . Smokeless tobacco: Never Used  . Alcohol Use: No     Comment: quit at age 26yo  . Drug Use: No  . Sexual Activity: No    Do you feel safe at home?  Yes Are there smokers in your home (other than you)? No  Dietary issues and exercise activities discussed: Current Exercise Habits: The patient has a physically strenous job, but  has no regular exercise apart from work. 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   INR = 3.4 today   Activities of Daily Living In your present state of health, do you have any difficulty performing the following activities: 03/06/2016 03/04/2016  Hearing? N N  Vision? N N  Difficulty concentrating or making decisions? N N  Walking or climbing stairs? N N  Dressing or bathing? N N  Doing errands, shopping? N N  Preparing Food and eating ? N -  Using the Toilet? N -  In the past six months, have you accidently leaked urine? N -  Do you have problems with loss of bowel control? N -  Managing your Medications? N -  Managing your Finances? N -  Housekeeping or managing your Housekeeping? N -     Depression Screen PHQ 2/9 Scores 03/06/2016 01/30/2016 07/14/2015 06/09/2015  PHQ - 2 Score 1 0 2 1  PHQ- 9 Score 2 - 3 -     Fall Risk Fall Risk  03/06/2016 01/30/2016 07/14/2015 06/09/2015 03/06/2015  Falls in the past year? No No Yes No No  Number falls in past yr: - - 1 - -  Injury with Fall? - - Yes - -  Risk Factor Category  - - High Fall Risk - -  Risk for fall due to : Impaired mobility - Impaired balance/gait;History of fall(s) - -  Risk for fall due to (comments): - - - - -  Follow up Falls prevention discussed - Falls evaluation completed;Falls prevention discussed - -    Cognitive Function: MMSE - Mini Mental State Exam 03/06/2016 12/23/2014  Orientation to time - 5  Orientation to Place - 5  Registration 3 3    Attention/ Calculation - 3  Recall 0 3  Language- name 2 objects - 2  Language- repeat - 0  Language- follow 3 step command - 3  Language- read & follow direction - 1  Write a sentence - 1  Copy design - 1  Total score - 27    Immunizations and Health Maintenance Immunization History  Administered Date(s) Administered  . Influenza,inj,Quad PF,36+ Mos 07/05/2013, 07/10/2015  . Influenza-Unspecified 07/21/2014  . Pneumococcal Conjugate-13 08/01/2014  . Pneumococcal Polysaccharide-23 08/17/1995  . Tdap 10/14/2008, 03/26/2015   Health Maintenance Due  Topic Date Due  . ZOSTAVAX  01/07/1988  Patient Care Team: Claretta Fraise, MD as PCP - General (Family Medicine) Jacelyn Pi, MD as Attending Physician (Endocrinology) Minus Breeding, MD as Consulting Physician (Cardiology) Druscilla Brownie, MD as Consulting Physician (Dermatology)  Indicate any recent Medical Services you may have received from other than [facility] providers in the past year (date may be approximate).    Assessment:    Annual Wellness Visit  Pre Diabetes Supratherapeutic anticoagulation   Screening Tests Health Maintenance  Topic Date Due  . ZOSTAVAX  01/07/1988  . INFLUENZA VACCINE  05/14/2016  . TETANUS/TDAP  03/25/2025  . PNA vac Low Risk Adult  Completed        Plan:   During the course of the visit [name] was educated and counseled about the following appropriate screening and preventive services:   Vaccines to include Pneumoccal, Influenza,  Td, Zostavax -  Due Zostavax but patient declined  Colorectal cancer screening - FOBT given in office today  Cardiovascular disease screening - UTD, due to see cardiologist 04/2016  Diabetes screening - A1c and last FBG showed pre diabetes.    Nutrition Consult - discussed limiting high CHO and sugar containing foods.   Glaucoma screening /  Eye Exam - reminded patient to get eye exam  Advanced Directives - information given  Physical  Activity- goal is 150 minutes weekly  Discussed that gabapentin might cause drowsiness - recommended that he try just 1/2 tablet of zolpidem while taking gabapentin. Might not need at all in the future.  Anticoagulation Dose Instructions as of 03/06/2016      Dorene Grebe Tue Wed Thu Fri Sat   New Dose 5 mg 7.5 mg 5 mg 5 mg 5 mg 5 mg 5 mg    Description        No warfarin tomorrow - Thursday, May 25th.  Then decreased warfarin dose to 1 tablet daily except 1 and 1/2 on Mondays only.        Patient Instructions (the written plan) were given to the patient.   Cherre Robins, [facility]   03/06/2016

## 2016-03-06 NOTE — Patient Instructions (Addendum)
Anticoagulation Dose Instructions as of 03/06/2016      [name] Tue Wed Thu Fri Sat   New Dose 5 mg 7.5 mg 5 mg 5 mg 5 mg 5 mg 5 mg    Description        No warfarin tomorrow - Thursday, May 25th.  Then decreased warfarin dose to 1 tablet daily except 1 and 1/2 on Mondays only.       Anticoagulation Dose Instructions as of 03/06/2016      [name] Tue Wed Thu Fri Sat   New Dose 5 mg 7.5 mg 5 mg 5 mg 5 mg 5 mg 5 mg    Description        No warfarin tomorrow - Thursday, May 25th.  Then decreased warfarin dose to 1 tablet daily except 1 and 1/2 on Mondays only.          [name] , Thank you for taking time to come for your Medicare Wellness Visit. I appreciate your ongoing commitment to your health goals. Please review the following plan we discussed and let me know if I can assist you in the future.   These are the goals we discussed:  Recommend physical activity daily - goal is 150 minutes per week    Increase non-starchy vegetables - carrots, green bean, squash, zucchini, tomatoes, onions, peppers, spinach and other green leafy vegetables, cabbage, lettuce, cucumbers, asparagus, okra (not fried), eggplant Limit sugar and processed foods (cakes, cookies, ice cream, crackers and chips) Increase fresh fruit but limit serving sizes 1/2 cup or about the size of tennis or baseball Limit red meat to no more than 1-2 times per week (serving size about the size of your palm) Choose whole grains / lean proteins - whole wheat bread, quinoa, whole grain rice (1/2 cup), fish, chicken, [place] Avoid sugar and calorie containing beverages - soda, sweet tea and juice.  Choose water or unsweetened tea instead.  You should get letter soon for a follow up appointment with Dr Jenkins Rouge (heart specialist) - due appointment in July 2017    This is a list of the screening recommended for you and due dates:  Health Maintenance  Topic Date Due  . Shingles Vaccine  Due now  . Flu Shot  05/14/2016  .  Hemoglobin A1C  07/31/2016  . Tetanus Vaccine  03/25/2025  . Pneumonia vaccines  Completed    Fall Prevention in the Home  Falls can cause injuries and can affect people from all age groups. There are many simple things that you can do to make your home safe and to help prevent falls. WHAT CAN I DO ON THE OUTSIDE OF MY HOME?  Regularly repair the edges of walkways and driveways and fix any cracks.  Remove high doorway thresholds.  Trim any shrubbery on the main path into your home.  Use bright outdoor lighting.  Clear walkways of debris and clutter, including tools and rocks.  Regularly check that handrails are securely fastened and in good repair. Both sides of any steps should have handrails.  Install guardrails along the edges of any raised decks or porches.  Have leaves, snow, and ice cleared regularly.  Use sand or salt on walkways during winter months.  In the garage, clean up any spills right away, including grease or oil spills. WHAT CAN I DO IN THE BATHROOM?  Use night lights.  Install grab bars by the toilet and in the tub and shower. Do not use towel bars as  grab bars.  Use non-skid mats or decals on the floor of the tub or shower.  If you need to sit down while you are in the shower, use a plastic, non-slip stool.Marland Kitchen  Keep the floor dry. Immediately clean up any water that spills on the floor.  Remove soap buildup in the tub or shower on a regular basis.  Attach bath mats securely with double-sided non-slip rug tape.  Remove throw rugs and other tripping hazards from the floor. WHAT CAN I DO IN THE BEDROOM?  Use night lights.  Make sure that a bedside light is easy to reach.  Do not use oversized bedding that drapes onto the floor.  Have a firm chair that has side arms to use for getting dressed.  Remove throw rugs and other tripping hazards from the floor. WHAT CAN I DO IN THE KITCHEN?   Clean up any spills right away.  Avoid walking on wet  floors.  Place frequently used items in easy-to-reach places.  If you need to reach for something above you, use a sturdy step stool that has a grab bar.  Keep electrical cables out of the way.  Do not use floor polish or wax that makes floors slippery. If you have to use wax, make sure that it is non-skid floor wax.  Remove throw rugs and other tripping hazards from the floor. WHAT CAN I DO IN THE STAIRWAYS?  Do not leave any items on the stairs.  Make sure that there are handrails on both sides of the stairs. Fix handrails that are broken or loose. Make sure that handrails are as long as the stairways.  Check any carpeting to make sure that it is firmly attached to the stairs. Fix any carpet that is loose or worn.  Avoid having throw rugs at the top or bottom of stairways, or secure the rugs with carpet tape to prevent them from moving.  Make sure that you have a light switch at the top of the stairs and the bottom of the stairs. If you do not have them, have them installed. WHAT ARE SOME OTHER FALL PREVENTION TIPS?  Wear closed-toe shoes that fit well and support your feet. Wear shoes that have rubber soles or low heels.  When you use a stepladder, make sure that it is completely opened and that the sides are firmly locked. Have someone hold the ladder while you are using it. Do not climb a closed stepladder.  Add color or contrast paint or tape to grab bars and handrails in your home. Place contrasting color strips on the first and last steps.  Use mobility aids as needed, such as canes, walkers, scooters, and crutches.  Turn on lights if it is dark. Replace any light bulbs that burn out.  Set up furniture so that there are clear paths. Keep the furniture in the same spot.  Fix any uneven floor surfaces.  Choose a carpet design that does not hide the edge of steps of a stairway.  Be aware of any and all pets.  Review your medicines with your healthcare provider. Some  medicines can cause dizziness or changes in blood pressure, which increase your risk of falling. Talk with your health care provider about other ways that you can decrease your risk of falls. This may include working with a physical therapist or trainer to improve your strength, balance, and endurance.   This information is not intended to replace advice given to you by your health care provider.  Make sure you discuss any questions you have with your health care provider.   Document Released: 09/20/2002 Document Revised: 02/14/2015 Document Reviewed: 11/04/2014 Elsevier Interactive Patient Education [employer].

## 2016-03-13 ENCOUNTER — Other Ambulatory Visit: Payer: Self-pay | Admitting: Family Medicine

## 2016-03-13 DIAGNOSIS — S41112A Laceration without foreign body of left upper arm, initial encounter: Secondary | ICD-10-CM | POA: Diagnosis not present

## 2016-03-13 DIAGNOSIS — W19XXXA Unspecified fall, initial encounter: Secondary | ICD-10-CM | POA: Diagnosis not present

## 2016-03-13 DIAGNOSIS — Z23 Encounter for immunization: Secondary | ICD-10-CM | POA: Diagnosis not present

## 2016-03-13 DIAGNOSIS — M7989 Other specified soft tissue disorders: Secondary | ICD-10-CM | POA: Diagnosis not present

## 2016-03-13 DIAGNOSIS — M25561 Pain in right knee: Secondary | ICD-10-CM | POA: Diagnosis not present

## 2016-03-15 ENCOUNTER — Encounter: Payer: Self-pay | Admitting: *Deleted

## 2016-03-18 ENCOUNTER — Other Ambulatory Visit: Payer: Self-pay | Admitting: Family Medicine

## 2016-03-25 ENCOUNTER — Ambulatory Visit (INDEPENDENT_AMBULATORY_CARE_PROVIDER_SITE_OTHER): Payer: Medicare Other | Admitting: Family Medicine

## 2016-03-25 ENCOUNTER — Ambulatory Visit (INDEPENDENT_AMBULATORY_CARE_PROVIDER_SITE_OTHER): Payer: Medicare Other

## 2016-03-25 ENCOUNTER — Other Ambulatory Visit: Payer: Self-pay | Admitting: Family Medicine

## 2016-03-25 ENCOUNTER — Encounter: Payer: Self-pay | Admitting: Family Medicine

## 2016-03-25 ENCOUNTER — Encounter: Payer: Medicare Other | Admitting: Pharmacist

## 2016-03-25 VITALS — BP 122/64 | HR 66 | Temp 96.8°F | Ht 66.0 in | Wt 177.6 lb

## 2016-03-25 DIAGNOSIS — I480 Paroxysmal atrial fibrillation: Secondary | ICD-10-CM | POA: Diagnosis not present

## 2016-03-25 DIAGNOSIS — M25571 Pain in right ankle and joints of right foot: Secondary | ICD-10-CM

## 2016-03-25 LAB — COAGUCHEK XS/INR WAIVED
INR: 5.1 — ABNORMAL HIGH (ref 0.9–1.1)
PROTHROMBIN TIME: 61 s

## 2016-03-25 NOTE — Progress Notes (Signed)
Subjective:  Patient ID: [name], male    DOB: 1926/02/28  Age: 82 y.o. MRN: [id number]  CC: Edema   HPI [name] presents for Patient fell while weed eating. He was [place] it at the top [place] and leaned over the hill and a pasture to get some weeds and fell forward. This occurred 2 weeks ago. He had knee pain and had x-rays done on 2 occasions of the knee. Both times the x-rays were reported as normal. Starting several days ago he noted more pain in the right ankle with ambulation. Additionally he has noted significant swelling in the ankle. He mentions that he doesn't know that he has a fluid pill on his medication list. He is not sure if he is taking it or not.   History [name] has a past medical history of Dilated cardiomyopathy ([place]); Atrial fibrillation ([place]); Drug therapy; CAD (coronary artery disease); Hypertension; Dyslipidemia; COPD (chronic obstructive pulmonary disease) ([place]); and Hypothyroidism.   He has past surgical history that includes Hip surgery (Left); Cardiac catheterization (05/2007); and Coronary stent placement.   His family history includes Diabetes in his other; Heart attack in his father and mother; Stroke in his other. He was adopted.He reports that he has never smoked. He has never used smokeless tobacco. He reports that he does not drink alcohol or use illicit drugs.    ROS Review of Systems  Constitutional: Negative for fever, chills and diaphoresis.  HENT: Negative for rhinorrhea and sore throat.   Respiratory: Negative for cough and shortness of breath.   Cardiovascular: Negative for chest pain.  Gastrointestinal: Negative for abdominal pain.  Musculoskeletal: Positive for myalgias, joint swelling and arthralgias.  Skin: Negative for rash.  Neurological: Negative for weakness and headaches.    Objective:  BP 122/64 mmHg  Pulse 66  Temp(Src) 96.8 F (36 C) (Oral)  Ht 5\' 6"  (1.676 m)  Wt 177 lb 9.6 oz (80.559 kg)  BMI 28.68 kg/m2   SpO2 97%  BP Readings from Last 3 Encounters:  03/25/16 122/64  03/06/16 102/60  03/04/16 105/53    Wt Readings from Last 3 Encounters:  03/25/16 177 lb 9.6 oz (80.559 kg)  03/06/16 169 lb (76.658 kg)  03/04/16 170 lb (77.111 kg)     Physical Exam  Constitutional: He appears well-developed and well-nourished.  HENT:  Head: Normocephalic and atraumatic.  Right Ear: Tympanic membrane and external ear normal. No decreased hearing is noted.  Left Ear: Tympanic membrane and external ear normal. No decreased hearing is noted.  Mouth/Throat: No oropharyngeal exudate or posterior oropharyngeal erythema.  Eyes: Pupils are equal, round, and reactive to light.  Neck: Normal range of motion. Neck supple.  Cardiovascular: Normal rate and regular rhythm.   No murmur heard. Pulmonary/Chest: Breath sounds normal. No respiratory distress.  Abdominal: Soft. Bowel sounds are normal. He exhibits no mass. There is no tenderness.  Musculoskeletal: Normal range of motion. He exhibits edema (2+ RLE at ankle and to mid tibia). Tenderness: at right ankle. Mild. Stable at ankle for collateral stress, drawer sign.  Skin: Skin is warm and dry.  Vitals reviewed.    Lab Results  Component Value Date   WBC 5.4 01/28/2016   HGB 12.7* 01/28/2016   HCT 39.2 01/28/2016   PLT 216 01/28/2016   GLUCOSE 131* 01/28/2016   CHOL 188 12/26/2015   TRIG 360* 12/26/2015   HDL 25* 12/26/2015   LDLCALC 91 12/26/2015   ALT 16* 01/28/2016   AST 22 01/28/2016  NA 140 01/28/2016   K 3.9 01/28/2016   CL 106 01/28/2016   CREATININE 1.16 01/28/2016   BUN 23* 01/28/2016   CO2 24 01/28/2016   TSH 0.065* 12/26/2015   INR 5.1* 03/25/2016   HGBA1C 6.1 07/14/2015    Dg Chest 2 View  01/28/2016  CLINICAL DATA:  Weakness.  Left arm pain. EXAM: CHEST  2 VIEW COMPARISON:  Previous examinations, the most recent dated 03/26/2015. FINDINGS: Normal sized heart. Clear lungs. Left nipple shadow, bilateral nipple shadows,  unchanged since 05/20/2007. Diffuse osteopenia. IMPRESSION: No acute abnormality. Electronically Signed   By: Claudie Revering M.D.   On: 01/28/2016 10:55   Ct Head Wo Contrast  01/28/2016  CLINICAL DATA:  Weakness since yesterday morning. EXAM: CT HEAD WITHOUT CONTRAST TECHNIQUE: Contiguous axial images were obtained from the base of the skull through the vertex without intravenous contrast. COMPARISON:  03/26/2015. FINDINGS: Diffusely enlarged ventricles and subarachnoid spaces. Stable third ventricular colloid cyst. No intracranial hemorrhage, mass lesion or CT evidence of acute infarction. Stable bilateral inferior mastoid sclerosis and mucosal thickening involving some of the mastoid air cells on the right. The included paranasal sinuses are normally pneumatized. IMPRESSION: 1. No acute abnormality. 2. Stable mild diffuse cerebral and cerebellar atrophy and third ventricular colloid cyst. Electronically Signed   By: Claudie Revering M.D.   On: 01/28/2016 11:02    Assessment & Plan:   Michaela was seen today for edema.  Diagnoses and all orders for this visit:  Pain in joint, ankle and foot, right -     DG Ankle Complete Right; Future  Paroxysmal atrial fibrillation (HCC) -     CoaguChek XS/INR Waived  Excessive Anticoagulation without bleeding  No coumadin Tues or Wed. See Tammy on Thurs. Wait until  After you se her to decide what to take that day.  I am having [name] maintain his fish oil-omega-3 fatty acids, CALCIUM PO, amLODipine, levothyroxine, NITROSTAT, carvedilol, zolpidem, fenofibrate micronized, traMADol, lisinopril, hydrochlorothiazide, celecoxib, escitalopram, warfarin, gabapentin, and indomethacin.  No orders of the defined types were placed in this encounter.     Follow-up: Return in about 3 days (around 03/28/2016) for with Tammy for INR.  Claretta Fraise, M.D.

## 2016-03-26 ENCOUNTER — Encounter: Payer: Self-pay | Admitting: Family Medicine

## 2016-03-26 NOTE — Telephone Encounter (Signed)
Last seen 03/25/16  Dr Livia Snellen   If approved route to nurse to call into [facility]

## 2016-03-26 NOTE — Telephone Encounter (Signed)
Please review and advise.

## 2016-03-26 NOTE — Telephone Encounter (Signed)
RX for Amibien and Tramadol called into [facility] per Dr Livia Snellen

## 2016-03-28 ENCOUNTER — Ambulatory Visit (INDEPENDENT_AMBULATORY_CARE_PROVIDER_SITE_OTHER): Payer: Medicare Other | Admitting: Pharmacist

## 2016-03-28 DIAGNOSIS — I480 Paroxysmal atrial fibrillation: Secondary | ICD-10-CM | POA: Diagnosis not present

## 2016-03-28 LAB — COAGUCHEK XS/INR WAIVED
INR: 2.6 — AB (ref 0.9–1.1)
Prothrombin Time: 30.6 s

## 2016-03-28 MED ORDER — ACETAMINOPHEN 500 MG PO TABS: 500.0000 mg | ORAL_TABLET | Freq: Two times a day (BID) | ORAL | Status: AC | PRN

## 2016-03-28 NOTE — Patient Instructions (Signed)
Anticoagulation Dose Instructions as of 03/28/2016      [name] Tue Wed Thu Fri Sat   New Dose 5 mg 5 mg 5 mg 5 mg 5 mg 5 mg 5 mg    Description        Discontinue indomethacin.  May use Tylenol / acetaminophen is needed for pain and arthritis.   Change warfarin to 5mg  - take 1 tablet daily      INR was 2.6 today

## 2016-03-28 NOTE — Progress Notes (Signed)
Subjective:     Indication: atrial fibrillation Bleeding signs/symptoms: None Thromboembolic signs/symptoms: None  Missed Coumadin doses: skipped 2 doses this week when INR was 5.1  Medication changes: yes - patient has been taking indomethacin 25mg  tid for arthritis pain Dietary changes: no Bacterial/viral infection: no Other concerns: no  The following portions of the patient's history were reviewed and updated as appropriate: allergies, current medications, past medical history and problem list.    Objective:    INR Today: 2.6 Current dose: warfarin 5mg  - take 1 and 1/2 mondays and 1 tablet all other days   Assessment:    Therapeutic INR for goal of 2-3  with previous supratherapeutic anticoagulation due to use of NSAID  Plan:    1. New dose: decrease warfarin to 5mg  take 1 tablet daily   2. Next INR: 2 weeks   3.  Discontinue indomethacin.   May take APAP 500-1000mg  up to bid as needed for pain 4.  May continue to take tramadol 50mg  up to q8h as needed for moderate to severe pain.

## 2016-03-31 ENCOUNTER — Other Ambulatory Visit: Payer: Self-pay

## 2016-03-31 ENCOUNTER — Emergency Department (HOSPITAL_COMMUNITY)
Admission: EM | Admit: 2016-03-31 | Discharge: 2016-03-31 | Disposition: A | Discharge status: Home / Self Care | Payer: Medicare Other | Attending: Emergency Medicine | Admitting: Emergency Medicine

## 2016-03-31 ENCOUNTER — Encounter (HOSPITAL_COMMUNITY): Payer: Self-pay | Admitting: Emergency Medicine

## 2016-03-31 ENCOUNTER — Emergency Department (HOSPITAL_COMMUNITY): Payer: Medicare Other

## 2016-03-31 DIAGNOSIS — E785 Hyperlipidemia, unspecified: Secondary | ICD-10-CM | POA: Diagnosis not present

## 2016-03-31 DIAGNOSIS — Z79899 Other long term (current) drug therapy: Secondary | ICD-10-CM | POA: Diagnosis not present

## 2016-03-31 DIAGNOSIS — R0602 Shortness of breath: Secondary | ICD-10-CM | POA: Insufficient documentation

## 2016-03-31 DIAGNOSIS — Z7901 Long term (current) use of anticoagulants: Secondary | ICD-10-CM | POA: Insufficient documentation

## 2016-03-31 DIAGNOSIS — R001 Bradycardia, unspecified: Secondary | ICD-10-CM | POA: Diagnosis not present

## 2016-03-31 DIAGNOSIS — J449 Chronic obstructive pulmonary disease, unspecified: Secondary | ICD-10-CM | POA: Diagnosis not present

## 2016-03-31 DIAGNOSIS — R600 Localized edema: Secondary | ICD-10-CM | POA: Diagnosis not present

## 2016-03-31 DIAGNOSIS — I10 Essential (primary) hypertension: Secondary | ICD-10-CM | POA: Insufficient documentation

## 2016-03-31 DIAGNOSIS — I2510 Atherosclerotic heart disease of native coronary artery without angina pectoris: Secondary | ICD-10-CM | POA: Insufficient documentation

## 2016-03-31 DIAGNOSIS — M7989 Other specified soft tissue disorders: Secondary | ICD-10-CM | POA: Diagnosis present

## 2016-03-31 DIAGNOSIS — E039 Hypothyroidism, unspecified: Secondary | ICD-10-CM | POA: Insufficient documentation

## 2016-03-31 LAB — BRAIN NATRIURETIC PEPTIDE: B NATRIURETIC PEPTIDE 5: 1015 pg/mL — AB (ref 0.0–100.0)

## 2016-03-31 LAB — COMPREHENSIVE METABOLIC PANEL
ALBUMIN: 3.8 g/dL (ref 3.5–5.0)
ALT: 8 U/L — AB (ref 17–63)
AST: 13 U/L — AB (ref 15–41)
Alkaline Phosphatase: 61 U/L (ref 38–126)
Anion gap: 7 (ref 5–15)
BUN: 32 mg/dL — AB (ref 6–20)
CHLORIDE: 104 mmol/L (ref 101–111)
CO2: 25 mmol/L (ref 22–32)
CREATININE: 1.29 mg/dL — AB (ref 0.61–1.24)
Calcium: 8.5 mg/dL — ABNORMAL LOW (ref 8.9–10.3)
GFR calc Af Amer: 55 mL/min — ABNORMAL LOW (ref >60)
GFR calc non Af Amer: 48 mL/min — ABNORMAL LOW (ref >60)
GLUCOSE: 144 mg/dL — AB (ref 65–99)
POTASSIUM: 3.8 mmol/L (ref 3.5–5.1)
Sodium: 136 mmol/L (ref 135–145)
Total Bilirubin: 0.6 mg/dL (ref 0.3–1.2)
Total Protein: 7.3 g/dL (ref 6.5–8.1)

## 2016-03-31 LAB — CBC WITH DIFFERENTIAL/PLATELET
BASOS ABS: 0 10*3/uL (ref 0.0–0.1)
BASOS PCT: 0 %
EOS PCT: 3 %
Eosinophils Absolute: 0.2 10*3/uL (ref 0.0–0.7)
HCT: 37.3 % — ABNORMAL LOW (ref 39.0–52.0)
Hemoglobin: 12 g/dL — ABNORMAL LOW (ref 13.0–17.0)
LYMPHS PCT: 19 %
Lymphs Abs: 1 10*3/uL (ref 0.7–4.0)
MCH: 25.7 pg — ABNORMAL LOW (ref 26.0–34.0)
MCHC: 32.2 g/dL (ref 30.0–36.0)
MCV: 79.9 fL (ref 78.0–100.0)
MONO ABS: 0.5 10*3/uL (ref 0.1–1.0)
Monocytes Relative: 10 %
NEUTROS ABS: 3.5 10*3/uL (ref 1.7–7.7)
Neutrophils Relative %: 68 %
PLATELETS: 339 10*3/uL (ref 150–400)
RBC: 4.67 MIL/uL (ref 4.22–5.81)
RDW: 15.4 % (ref 11.5–15.5)
WBC: 5.3 10*3/uL (ref 4.0–10.5)

## 2016-03-31 LAB — PROTIME-INR
INR: 1.84 — ABNORMAL HIGH (ref 0.00–1.49)
Prothrombin Time: 21.2 seconds — ABNORMAL HIGH (ref 11.6–15.2)

## 2016-03-31 LAB — TROPONIN I

## 2016-03-31 MED ORDER — FUROSEMIDE 20 MG PO TABS: 20.0000 mg | ORAL_TABLET | Freq: Every day | ORAL | Status: DC

## 2016-03-31 MED ORDER — FUROSEMIDE 10 MG/ML IJ SOLN
20.0000 mg | Freq: Once | INTRAMUSCULAR | Status: AC
  Administered 2016-03-31: 20 mg via INTRAVENOUS
  Filled 2016-03-31: qty 2

## 2016-03-31 NOTE — ED Notes (Signed)
Pt states he fell about 3 weeks ago and has not been right since.  Has been to doctor multiple times and told he was fine, but pt states he cannot walk well, legs are swollen, and he just does not feel well.

## 2016-03-31 NOTE — ED Provider Notes (Signed)
CSN: [id number]     Arrival date & time 03/31/16  1054 History  By signing my name below, I, Georgette Shell, attest that this documentation has been prepared under the direction and in the presence of Sherwood Gambler, MD. Electronically Signed: Georgette Shell, ED Scribe. 03/31/2016. 12:34 PM.   Chief Complaint  Patient presents with  . Bradycardia  . Leg Swelling    The history is provided by the patient. No language interpreter was used.    HPI Comments: [name] is a 82 y.o. male with h/o A-Fib who presents to the Emergency Department complaining of constant, gradually worsening, bilateral leg swelling s/p mechanical fall three weeks ago. Pt notes that the leg swelling is more significant in the right leg. Pt states he came to the ED today because the pain and leg swelling is getting worse. Pt says he has difficulty ambulating due to pain and weakness. Pt also reports associated intermittent SOB. He notes that SOB does not get significantly worse upon movement or laying down. Pt has seen PCP and has been compliant with medication. Pt has no current history of CHF or use of fluid pills. Pt denies chest pain, dizziness, light-headedness, cough, and fever.   Past Medical History  Diagnosis Date  . Dilated cardiomyopathy ([place])     Felt to be secondary to atrial fibrillation. EF is about 40%  . Atrial fibrillation (HCC)     Persistent, treated with amiodarone and cardioversion  . Drug therapy     Chronic Coumadin therapy  . CAD (coronary artery disease)     Last catheterization in August 2008 demonstrated the LAD 40% stenosis  . Hypertension   . Dyslipidemia   . COPD (chronic obstructive pulmonary disease) ([place])   . Hypothyroidism     Possibly related to amiodarone   Past Surgical History  Procedure Laterality Date  . Hip surgery Left     8yo  . Cardiac catheterization  05/2007    LAD 40% stenosis. Stent in the LAD had 20% in-stent restenosis. Followed by 40-50% distal stenosis. 50% diagonal  stenosis. 40-50% circumflex stenosis. 70% OM1 stenosis and 30% ostial RCA stenosis. 30% proximal RCA stenosis.  . Coronary stent placement     Family History  Problem Relation Age of Onset  . Adopted: Yes  . Heart attack Mother   . Heart attack Father   . Diabetes Other   . Stroke Other    Social History  Substance Use Topics  . Smoking status: Never Smoker   . Smokeless tobacco: Never Used  . Alcohol Use: No     Comment: quit at age 85yo    Review of Systems  Constitutional: Negative for fever.  Respiratory: Positive for shortness of breath. Negative for cough.   Cardiovascular: Positive for leg swelling. Negative for chest pain.  All other systems reviewed and are negative.    Allergies  Amiodarone and Penicillins  Home Medications   Prior to Admission medications   Medication Sig Start Date End Date Taking? Authorizing Provider  acetaminophen (TYLENOL) 500 MG tablet Take 1-2 tablets (500-1,000 mg total) by mouth 2 (two) times daily as needed. Patient taking differently: Take 500-1,000 mg by mouth 2 (two) times daily as needed for moderate pain.  03/28/16  Yes Tammy Eckard, PHARMD  amLODipine (NORVASC) 10 MG tablet TAKE 1 TABLET ONCE A DAY 05/15/15  Yes Minus Breeding, MD  CALCIUM PO Take 600 mg by mouth daily.    Yes Historical Provider, MD  carvedilol (COREG) 12.5  MG tablet TAKE (1) TABLET TWICE A DAY. 11/23/15  Yes Claretta Fraise, MD  cholecalciferol (VITAMIN D) 1000 units tablet Take 1,000 Units by mouth daily.   Yes Historical Provider, MD  escitalopram (LEXAPRO) 5 MG tablet Take 1 tablet (5 mg total) by mouth daily. 03/04/16  Yes Claretta Fraise, MD  fenofibrate micronized (LOFIBRA) 134 MG capsule TAKE (1) CAPSULE DAILY BEFORE BREAKFAST. 01/03/16  Yes Claretta Fraise, MD  fish oil-omega-3 fatty acids 1000 MG capsule Take 1,400 mg by mouth daily.    Yes Historical Provider, MD  gabapentin (NEURONTIN) 600 MG tablet Take 1 tablet (600 mg total) by mouth 3 (three) times daily.  03/13/16  Yes Claretta Fraise, MD  hydrochlorothiazide (MICROZIDE) 12.5 MG capsule Take 1 capsule (12.5 mg total) by mouth daily. For fluid, BP 03/04/16  Yes Claretta Fraise, MD  levothyroxine (SYNTHROID, LEVOTHROID) 150 MCG tablet TAKE 1 TABLET DAILY 11/13/15  Yes Claretta Fraise, MD  lisinopril (PRINIVIL,ZESTRIL) 20 MG tablet Take 1 tablet by mouth daily. 02/14/16  Yes Historical Provider, MD  NITROSTAT 0.4 MG SL tablet Place 1 tablet (0.4 mg total) under the tongue every 5 (five) minutesas needed for chest pain. 11/14/15  Yes Claretta Fraise, MD  traMADol (ULTRAM) 50 MG tablet TAKE 1 TABLET EVERY 6 HOURS AS NEEDED FOR MODERATE TO SEVERE PAIN 03/26/16  Yes Claretta Fraise, MD  warfarin (COUMADIN) 5 MG tablet Take 7.5mg  = 1 and 1/2 tablets every Monday.  Take 5mg  = 1 tablet all other days. 03/06/16  Yes Tammy Eckard, PHARMD  zolpidem (AMBIEN) 10 MG tablet TAKE ONE TABLET AT BEDTIME 03/26/16  Yes Claretta Fraise, MD  indomethacin (INDOCIN) 25 MG capsule TAKE  (1)  CAPSULE THREE TIMES DAILY AS NEEDED. Patient not taking: Reported on 03/31/2016 03/18/16   Claretta Fraise, MD   BP 172/56 mmHg  Pulse 37  Temp(Src) 97.9 F (36.6 C) (Oral)  Resp 23  Ht 5\' 6"  (1.676 m)  Wt 177 lb (80.287 kg)  BMI 28.58 kg/m2  SpO2 92% Physical Exam  Constitutional: He is oriented to person, place, and time. He appears well-developed and well-nourished.  HENT:  Head: Normocephalic and atraumatic.  Right Ear: External ear normal.  Left Ear: External ear normal.  Nose: Nose normal.  Eyes: Right eye exhibits no discharge. Left eye exhibits no discharge.  Neck: Neck supple.  Cardiovascular: Normal rate, normal heart sounds and intact distal pulses.  An irregular rhythm present.  Pulses:      Dorsalis pedis pulses are 2+ on the right side, and 2+ on the left side.  Pulmonary/Chest: Effort normal and breath sounds normal. No respiratory distress.  Abdominal: Soft. There is no tenderness.  Musculoskeletal: He exhibits edema (symmetric 2+  BLE pitting edema).  Neurological: He is alert and oriented to person, place, and time.  5/5 strength in all four extremities  Skin: Skin is warm and dry.  Nursing note and vitals reviewed.   ED Course  Procedures (including critical care time) DIAGNOSTIC STUDIES: Oxygen Saturation is 92% on RA, poor by my interpretation.    COORDINATION OF CARE: 12:34 PM Discussed treatment plan with pt at bedside which includes blood work and pt agreed to plan.   Labs Review Labs Reviewed  CBC WITH DIFFERENTIAL/PLATELET - Abnormal; Notable for the following:    Hemoglobin 12.0 (*)    HCT 37.3 (*)    MCH 25.7 (*)    All other components within normal limits  COMPREHENSIVE METABOLIC PANEL - Abnormal; Notable for the following:  Glucose, Bld 144 (*)    BUN 32 (*)    Creatinine, Ser 1.29 (*)    Calcium 8.5 (*)    AST 13 (*)    ALT 8 (*)    GFR calc non Af Amer 48 (*)    GFR calc Af Amer 55 (*)    All other components within normal limits  BRAIN NATRIURETIC PEPTIDE - Abnormal; Notable for the following:    B Natriuretic Peptide 1015.0 (*)    All other components within normal limits  PROTIME-INR - Abnormal; Notable for the following:    Prothrombin Time 21.2 (*)    INR 1.84 (*)    All other components within normal limits  TROPONIN I    Imaging Review Dg Chest Portable 1 View  03/31/2016  CLINICAL DATA:  Shortness of breath. EXAM: PORTABLE CHEST 1 VIEW COMPARISON:  Radiographs of January 28, 2016. FINDINGS: The heart size and mediastinal contours are within normal limits. Both lungs are clear. No pneumothorax or pleural effusion is noted. The visualized skeletal structures are unremarkable. IMPRESSION: No acute cardiopulmonary abnormality seen. Electronically Signed   By: Marijo Conception, M.D.   On: 03/31/2016 12:12   I have personally reviewed and evaluated these images and lab results as part of my medical decision-making.   EKG Interpretation   Date/Time:  Sunday March 31 2016  11:15:48 EDT Ventricular Rate:  90 PR Interval:    QRS Duration: 148 QT Interval:  433 QTC Calculation: 433 R Axis:   21 Text Interpretation:  Atrial fibrillation Paired ventricular premature  complexes IVCD, consider atypical LBBB similar to April 2017 Confirmed by  Regenia Skeeter MD, Lycia Sachdeva [phone]) on 03/31/2016 12:37:16 PM      MDM   Final diagnoses:  Bilateral leg edema    Patient is overall not in distress and well-appearing. He has bilateral lower extremity edema but no significant increased work of breathing and appears comfortable. No orthopnea. Chest x-ray is clear and lungs are clear. His sats are on the low side of normal but when he walks around his oxygen stays adequate at 95-96 percent. This appears to be a subacute issue with 3 weeks of symptoms. On chart review looks like he's had leg swelling in the past any Caryl Pina does have cardiomyopathy with a depressed EF. We'll place him on Lasix 20 mg daily, which she has been on in the past. At this point I told think he needs to be admitted to the hospital with no significant increased work of breathing or hypoxia. Discussed with patient and he wants to go home. Discussed importance of following up closely with cardiology and PCP. Discussed return precautions.  I personally performed the services described in this documentation, which was scribed in my presence. The recorded information has been reviewed and is accurate.    Sherwood Gambler, MD 03/31/16 [phone]

## 2016-03-31 NOTE — ED Notes (Signed)
Ambulated pt, oxygen maintained 96 the entire walk

## 2016-04-01 ENCOUNTER — Observation Stay (HOSPITAL_COMMUNITY)
Admission: EM | Admit: 2016-04-01 | Discharge: 2016-04-03 | Disposition: A | Discharge status: Home / Self Care | Payer: Medicare Other | Attending: Internal Medicine | Admitting: Internal Medicine

## 2016-04-01 ENCOUNTER — Emergency Department (HOSPITAL_COMMUNITY): Payer: Medicare Other

## 2016-04-01 ENCOUNTER — Encounter (HOSPITAL_COMMUNITY): Payer: Self-pay | Admitting: Emergency Medicine

## 2016-04-01 ENCOUNTER — Other Ambulatory Visit: Payer: Self-pay

## 2016-04-01 DIAGNOSIS — I480 Paroxysmal atrial fibrillation: Secondary | ICD-10-CM | POA: Diagnosis not present

## 2016-04-01 DIAGNOSIS — I472 Ventricular tachycardia: Secondary | ICD-10-CM | POA: Diagnosis not present

## 2016-04-01 DIAGNOSIS — I5043 Acute on chronic combined systolic (congestive) and diastolic (congestive) heart failure: Secondary | ICD-10-CM | POA: Insufficient documentation

## 2016-04-01 DIAGNOSIS — E039 Hypothyroidism, unspecified: Secondary | ICD-10-CM | POA: Diagnosis present

## 2016-04-01 DIAGNOSIS — R0602 Shortness of breath: Secondary | ICD-10-CM | POA: Insufficient documentation

## 2016-04-01 DIAGNOSIS — I1310 Hypertensive heart and chronic kidney disease without heart failure, with stage 1 through stage 4 chronic kidney disease, or unspecified chronic kidney disease: Secondary | ICD-10-CM | POA: Diagnosis present

## 2016-04-01 DIAGNOSIS — Z79899 Other long term (current) drug therapy: Secondary | ICD-10-CM | POA: Diagnosis not present

## 2016-04-01 DIAGNOSIS — I5022 Chronic systolic (congestive) heart failure: Secondary | ICD-10-CM | POA: Insufficient documentation

## 2016-04-01 DIAGNOSIS — I420 Dilated cardiomyopathy: Secondary | ICD-10-CM | POA: Insufficient documentation

## 2016-04-01 DIAGNOSIS — E785 Hyperlipidemia, unspecified: Secondary | ICD-10-CM | POA: Insufficient documentation

## 2016-04-01 DIAGNOSIS — I110 Hypertensive heart disease with heart failure: Secondary | ICD-10-CM | POA: Diagnosis not present

## 2016-04-01 DIAGNOSIS — N19 Unspecified kidney failure: Secondary | ICD-10-CM

## 2016-04-01 DIAGNOSIS — I2510 Atherosclerotic heart disease of native coronary artery without angina pectoris: Secondary | ICD-10-CM | POA: Diagnosis not present

## 2016-04-01 DIAGNOSIS — Z955 Presence of coronary angioplasty implant and graft: Secondary | ICD-10-CM | POA: Insufficient documentation

## 2016-04-01 DIAGNOSIS — I5021 Acute systolic (congestive) heart failure: Secondary | ICD-10-CM | POA: Diagnosis not present

## 2016-04-01 DIAGNOSIS — I5023 Acute on chronic systolic (congestive) heart failure: Secondary | ICD-10-CM | POA: Diagnosis not present

## 2016-04-01 DIAGNOSIS — I509 Heart failure, unspecified: Secondary | ICD-10-CM

## 2016-04-01 DIAGNOSIS — J449 Chronic obstructive pulmonary disease, unspecified: Secondary | ICD-10-CM | POA: Insufficient documentation

## 2016-04-01 DIAGNOSIS — Z7901 Long term (current) use of anticoagulants: Secondary | ICD-10-CM | POA: Diagnosis not present

## 2016-04-01 DIAGNOSIS — M7989 Other specified soft tissue disorders: Secondary | ICD-10-CM | POA: Insufficient documentation

## 2016-04-01 LAB — TSH: TSH: 0.596 u[IU]/mL (ref 0.350–4.500)

## 2016-04-01 LAB — I-STAT CHEM 8, ED
BUN: 26 mg/dL — AB (ref 6–20)
CHLORIDE: 100 mmol/L — AB (ref 101–111)
Calcium, Ion: 1.13 mmol/L (ref 1.13–1.30)
Creatinine, Ser: 1.2 mg/dL (ref 0.61–1.24)
Glucose, Bld: 150 mg/dL — ABNORMAL HIGH (ref 65–99)
HEMATOCRIT: 40 % (ref 39.0–52.0)
Hemoglobin: 13.6 g/dL (ref 13.0–17.0)
POTASSIUM: 3.9 mmol/L (ref 3.5–5.1)
SODIUM: 138 mmol/L (ref 135–145)
TCO2: 25 mmol/L (ref 0–100)

## 2016-04-01 MED ORDER — SODIUM CHLORIDE 0.9% FLUSH
3.0000 mL | INTRAVENOUS | Status: DC | PRN
  Administered 2016-04-02: 3 mL via INTRAVENOUS
  Filled 2016-04-01: qty 3

## 2016-04-01 MED ORDER — SODIUM CHLORIDE 0.9% FLUSH
3.0000 mL | Freq: Two times a day (BID) | INTRAVENOUS | Status: DC
  Administered 2016-04-01 – 2016-04-02 (×3): 3 mL via INTRAVENOUS

## 2016-04-01 MED ORDER — FUROSEMIDE 10 MG/ML IJ SOLN
40.0000 mg | Freq: Two times a day (BID) | INTRAMUSCULAR | Status: DC
Start: 2016-04-01 — End: 2016-04-03
  Administered 2016-04-01 – 2016-04-03 (×4): 40 mg via INTRAVENOUS
  Filled 2016-04-01 (×4): qty 4

## 2016-04-01 MED ORDER — LISINOPRIL 10 MG PO TABS
20.0000 mg | ORAL_TABLET | Freq: Every day | ORAL | Status: DC
  Administered 2016-04-02 – 2016-04-03 (×2): 20 mg via ORAL
  Filled 2016-04-01 (×2): qty 2

## 2016-04-01 MED ORDER — LEVOTHYROXINE SODIUM 75 MCG PO TABS
150.0000 ug | ORAL_TABLET | Freq: Every day | ORAL | Status: DC
  Administered 2016-04-02 – 2016-04-03 (×2): 150 ug via ORAL
  Filled 2016-04-01 (×2): qty 2

## 2016-04-01 MED ORDER — ONDANSETRON HCL 4 MG/2ML IJ SOLN: 4.0000 mg | Freq: Four times a day (QID) | INTRAMUSCULAR | Status: DC | PRN

## 2016-04-01 MED ORDER — FENOFIBRATE 160 MG PO TABS
160.0000 mg | ORAL_TABLET | Freq: Every day | ORAL | Status: DC
  Administered 2016-04-02 – 2016-04-03 (×2): 160 mg via ORAL
  Filled 2016-04-01 (×2): qty 1

## 2016-04-01 MED ORDER — AMLODIPINE BESYLATE 5 MG PO TABS
10.0000 mg | ORAL_TABLET | Freq: Every day | ORAL | Status: DC
  Administered 2016-04-02 – 2016-04-03 (×2): 10 mg via ORAL
  Filled 2016-04-01 (×2): qty 2

## 2016-04-01 MED ORDER — GABAPENTIN 300 MG PO CAPS
600.0000 mg | ORAL_CAPSULE | Freq: Three times a day (TID) | ORAL | Status: DC
  Administered 2016-04-01 – 2016-04-03 (×5): 600 mg via ORAL
  Filled 2016-04-01 (×5): qty 2

## 2016-04-01 MED ORDER — ACETAMINOPHEN 325 MG PO TABS
650.0000 mg | ORAL_TABLET | ORAL | Status: DC | PRN
  Administered 2016-04-01 – 2016-04-03 (×3): 650 mg via ORAL
  Filled 2016-04-01 (×3): qty 2

## 2016-04-01 MED ORDER — CARVEDILOL 12.5 MG PO TABS
12.5000 mg | ORAL_TABLET | Freq: Two times a day (BID) | ORAL | Status: DC
  Administered 2016-04-01 – 2016-04-03 (×4): 12.5 mg via ORAL
  Filled 2016-04-01 (×4): qty 1

## 2016-04-01 MED ORDER — OMEGA-3-ACID ETHYL ESTERS 1 G PO CAPS
1000.0000 mg | ORAL_CAPSULE | Freq: Every day | ORAL | Status: DC
  Administered 2016-04-02 – 2016-04-03 (×2): 1000 mg via ORAL
  Filled 2016-04-01 (×2): qty 1

## 2016-04-01 MED ORDER — NITROGLYCERIN 2 % TD OINT
1.0000 [in_us] | TOPICAL_OINTMENT | Freq: Once | TRANSDERMAL | Status: AC
  Administered 2016-04-01: 1 [in_us] via TOPICAL
  Filled 2016-04-01: qty 1

## 2016-04-01 MED ORDER — CITALOPRAM HYDROBROMIDE 20 MG PO TABS
20.0000 mg | ORAL_TABLET | Freq: Every day | ORAL | Status: DC
  Administered 2016-04-02 – 2016-04-03 (×2): 20 mg via ORAL
  Filled 2016-04-01 (×2): qty 1

## 2016-04-01 MED ORDER — FUROSEMIDE 10 MG/ML IJ SOLN
40.0000 mg | Freq: Once | INTRAMUSCULAR | Status: AC
  Administered 2016-04-01: 40 mg via INTRAVENOUS
  Filled 2016-04-01: qty 4

## 2016-04-01 MED ORDER — SODIUM CHLORIDE 0.9 % IV SOLN: 250.0000 mL | INTRAVENOUS | Status: DC | PRN

## 2016-04-01 NOTE — ED Provider Notes (Signed)
History  By signing my name below, I, Bea Graff, attest that this documentation has been prepared under the direction and in the presence of Ripley Fraise, MD. Electronically Signed: Bea Graff, ED Scribe. 04/01/2016. 11:27 AM.  Chief Complaint  Patient presents with  . Leg Swelling   Patient is a 82 y.o. male presenting with shortness of breath. The history is provided by the patient and medical records. No language interpreter was used.  Shortness of Breath Severity:  Moderate Onset quality:  Unable to specify Duration:  3 weeks Progression:  Worsening Chronicity:  Recurrent Relieved by:  None tried Worsened by:  Nothing tried Ineffective treatments:  None tried Associated symptoms: no abdominal pain, no fever, no syncope and no vomiting   Associated symptoms comment:  BLE edema   HPI Comments:  [name] is a 82 y.o. male with PMHx of atrial fibrillation, CAD, HTN, HLD, COPD and hypothyroidism who presents to the Emergency Department complaining of bilateral ankle swelling and SOB that began about three weeks ago. Pt was seen here yesterday for similar symptoms and declined to be admitted. He states he did not sleep at all last night due to the symptoms and reports feeling as if he should have stayed in the hospital. He has not done anything to treat his symptoms. He denies modifying factors. He denies fever, chills, nausea, vomiting, CP, LOC, abdominal pain. He denies home oxygen use.  Past Medical History  Diagnosis Date  . Dilated cardiomyopathy ([place])     Felt to be secondary to atrial fibrillation. EF is about 40%  . Atrial fibrillation (HCC)     Persistent, treated with amiodarone and cardioversion  . Drug therapy     Chronic Coumadin therapy  . CAD (coronary artery disease)     Last catheterization in August 2008 demonstrated the LAD 40% stenosis  . Hypertension   . Dyslipidemia   . COPD (chronic obstructive pulmonary disease) ([place])   . Hypothyroidism      Possibly related to amiodarone   Past Surgical History  Procedure Laterality Date  . Hip surgery Left     8yo  . Cardiac catheterization  05/2007    LAD 40% stenosis. Stent in the LAD had 20% in-stent restenosis. Followed by 40-50% distal stenosis. 50% diagonal stenosis. 40-50% circumflex stenosis. 70% OM1 stenosis and 30% ostial RCA stenosis. 30% proximal RCA stenosis.  . Coronary stent placement     Family History  Problem Relation Age of Onset  . Adopted: Yes  . Heart attack Mother   . Heart attack Father   . Diabetes Other   . Stroke Other    Social History  Substance Use Topics  . Smoking status: Never Smoker   . Smokeless tobacco: Never Used  . Alcohol Use: No     Comment: quit at age 59yo    Review of Systems  Constitutional: Negative for fever.  Respiratory: Positive for shortness of breath.   Cardiovascular: Positive for leg swelling. Negative for syncope.  Gastrointestinal: Negative for vomiting and abdominal pain.  All other systems reviewed and are negative.   Allergies  Amiodarone and Penicillins  Home Medications   Prior to Admission medications   Medication Sig Start Date End Date Taking? Authorizing Provider  acetaminophen (TYLENOL) 500 MG tablet Take 1-2 tablets (500-1,000 mg total) by mouth 2 (two) times daily as needed. Patient taking differently: Take 500-1,000 mg by mouth 2 (two) times daily as needed for moderate pain.  03/28/16  Yes Cherre Robins, PHARMD  amLODipine (NORVASC) 10 MG tablet TAKE 1 TABLET ONCE A DAY 05/15/15  Yes Minus Breeding, MD  CALCIUM PO Take 600 mg by mouth daily.    Yes Historical Provider, MD  carvedilol (COREG) 12.5 MG tablet TAKE (1) TABLET TWICE A DAY. 11/23/15  Yes Claretta Fraise, MD  cholecalciferol (VITAMIN D) 1000 units tablet Take 1,000 Units by mouth daily.   Yes Historical Provider, MD  escitalopram (LEXAPRO) 5 MG tablet Take 1 tablet (5 mg total) by mouth daily. 03/04/16  Yes Claretta Fraise, MD  fenofibrate  micronized (LOFIBRA) 134 MG capsule TAKE (1) CAPSULE DAILY BEFORE BREAKFAST. 01/03/16  Yes Claretta Fraise, MD  fish oil-omega-3 fatty acids 1000 MG capsule Take 1,400 mg by mouth daily.    Yes Historical Provider, MD  gabapentin (NEURONTIN) 600 MG tablet Take 1 tablet (600 mg total) by mouth 3 (three) times daily. 03/13/16  Yes Claretta Fraise, MD  hydrochlorothiazide (MICROZIDE) 12.5 MG capsule Take 1 capsule (12.5 mg total) by mouth daily. For fluid, BP 03/04/16  Yes Claretta Fraise, MD  indomethacin (INDOCIN) 25 MG capsule TAKE  (1)  CAPSULE THREE TIMES DAILY AS NEEDED. 03/18/16  Yes Claretta Fraise, MD  levothyroxine (SYNTHROID, LEVOTHROID) 150 MCG tablet TAKE 1 TABLET DAILY 11/13/15  Yes Claretta Fraise, MD  lisinopril (PRINIVIL,ZESTRIL) 20 MG tablet Take 1 tablet by mouth daily. 02/14/16  Yes Historical Provider, MD  NITROSTAT 0.4 MG SL tablet Place 1 tablet (0.4 mg total) under the tongue every 5 (five) minutesas needed for chest pain. 11/14/15  Yes Claretta Fraise, MD  traMADol (ULTRAM) 50 MG tablet TAKE 1 TABLET EVERY 6 HOURS AS NEEDED FOR MODERATE TO SEVERE PAIN 03/26/16  Yes Claretta Fraise, MD  warfarin (COUMADIN) 5 MG tablet Take 7.5mg  = 1 and 1/2 tablets every Monday.  Take 5mg  = 1 tablet all other days. 03/06/16  Yes Tammy Eckard, PHARMD  furosemide (LASIX) 20 MG tablet Take 1 tablet (20 mg total) by mouth daily. Patient not taking: Reported on 04/01/2016 03/31/16   Sherwood Gambler, MD  zolpidem (AMBIEN) 10 MG tablet TAKE ONE TABLET AT BEDTIME Patient not taking: Reported on 04/01/2016 03/26/16   Claretta Fraise, MD   Triage Vitals: BP 173/61 mmHg  Pulse 72  Temp(Src) 98 F (36.7 C)  Resp 26  Ht 5\' 6"  (1.676 m)  Wt 177 lb (80.287 kg)  BMI 28.58 kg/m2  SpO2 93%  Physical Exam  CONSTITUTIONAL: elderly and frail HEAD: Normocephalic/atraumatic EYES: EOMI ENMT: Mucous membranes moist NECK: supple no meningeal signs SPINE/BACK:entire spine nontender CV: irregular, no loud murmurs noted LUNGS: Crackles  bilaterally ABDOMEN: soft, nontender, no rebound or guarding, bowel sounds noted throughout abdomen GU:no cva tenderness NEURO: Pt is awake/alert/appropriate, moves all extremitiesx4.  No facial droop.   EXTREMITIES: pulses normal/equal, full ROM; pitting edema to BLE SKIN: warm, color normal PSYCH: no abnormalities of mood noted, alert and oriented to situation   ED Course  Procedures  DIAGNOSTIC STUDIES: Oxygen Saturation is 93% on RA, low by my interpretation.   COORDINATION OF CARE: 9:00 AM- Will order CXR and labs. Patient verbalizes understanding and agrees to plan.  Pt seen in ED yesterday but felt well for d/c home ,but since then he has felt worse, worsenend LE edema and worsened dyspnea on exertion BNP elevated yesterday Concern for CHF Pt with hypoxia on ambulation Will admit D/w dr Jerilee Hoh for admission   Medications  furosemide (LASIX) injection 40 mg (40 mg Intravenous Given 04/01/16 1053)  nitroGLYCERIN (NITROGLYN) 2 % ointment 1  inch (1 inch Topical Given 04/01/16 1236)    Labs Review Labs Reviewed  I-STAT CHEM 8, ED - Abnormal; Notable for the following:    Chloride 100 (*)    BUN 26 (*)    Glucose, Bld 150 (*)    All other components within normal limits    Imaging Review Dg Chest Portable 1 View  04/01/2016  CLINICAL DATA:  Shortness of breath, leg swelling EXAM: PORTABLE CHEST 1 VIEW COMPARISON:  03/31/2016 FINDINGS: The heart size and mediastinal contours are within normal limits. Both lungs are clear. The visualized skeletal structures are unremarkable. IMPRESSION: No active disease. Electronically Signed   By: Kathreen Devoid   On: 04/01/2016 09:26   Dg Chest Portable 1 View  03/31/2016  CLINICAL DATA:  Shortness of breath. EXAM: PORTABLE CHEST 1 VIEW COMPARISON:  Radiographs of January 28, 2016. FINDINGS: The heart size and mediastinal contours are within normal limits. Both lungs are clear. No pneumothorax or pleural effusion is noted. The visualized  skeletal structures are unremarkable. IMPRESSION: No acute cardiopulmonary abnormality seen. Electronically Signed   By: Marijo Conception, M.D.   On: 03/31/2016 12:12   I have personally reviewed and evaluated these images and lab results as part of my medical decision-making.  ED ECG REPORT   Date: 04/01/2016 807am  Rate: 91  Rhythm: atrial fibrillation  QRS Axis: left  Intervals: normal  ST/T Wave abnormalities: nonspecific ST changes  Conduction Disutrbances:left bundle branch block  Narrative Interpretation:   Old EKG Reviewed: unchanged  I have personally reviewed the EKG tracing and agree with the computerized printout as noted.   MDM   Final diagnoses:  Acute systolic congestive heart failure ([place])    Nursing notes including past medical history and social history reviewed and considered in documentation xrays/imaging reviewed by myself and considered during evaluation Labs/vital reviewed myself and considered during evaluation Previous records reviewed and considered   I personally performed the services described in this documentation, which was scribed in my presence. The recorded information has been reviewed and is accurate.       Ripley Fraise, MD 04/01/16 1251

## 2016-04-01 NOTE — ED Notes (Addendum)
Attempted to call report, RN unavailable.

## 2016-04-01 NOTE — H&P (Signed)
History and Physical    [name] [id number] DOB: 01-23-1926 DOA: 04/01/2016  Referring MD/NP/PA: Julious Oka, M.D. PCP: Claretta Fraise, MD  Patient coming from: Home  Chief Complaint: Shortness of breath, difficulty lying flat  HPI: [name] is a 82 y.o. male with history of dilated cardiomyopathy with ejection fraction of 40% as per echo in 2008, persistent atrial fibrillation on chronic anticoagulation with Coumadin, hypertension, hyperlipidemia, hypothyroidism who presents to hospital with the above complaints. This began approximately 5 days ago and has gotten progressively worse. He is having difficulty lying down flat and is requiring multiple pillows at nighttime. He comes into the hospital today for his found to have bilateral lower extremity edema, a BMP of over 1000, hypoxemia with O2 sats of 86% on room air. Admission has been requested for further evaluation and management.  Past Medical/Surgical History: Past Medical History  Diagnosis Date  . Dilated cardiomyopathy ([place])     Felt to be secondary to atrial fibrillation. EF is about 40%  . Atrial fibrillation (HCC)     Persistent, treated with amiodarone and cardioversion  . Drug therapy     Chronic Coumadin therapy  . CAD (coronary artery disease)     Last catheterization in August 2008 demonstrated the LAD 40% stenosis  . Hypertension   . Dyslipidemia   . COPD (chronic obstructive pulmonary disease) ([place])   . Hypothyroidism     Possibly related to amiodarone    Past Surgical History  Procedure Laterality Date  . Hip surgery Left     8yo  . Cardiac catheterization  05/2007    LAD 40% stenosis. Stent in the LAD had 20% in-stent restenosis. Followed by 40-50% distal stenosis. 50% diagonal stenosis. 40-50% circumflex stenosis. 70% OM1 stenosis and 30% ostial RCA stenosis. 30% proximal RCA stenosis.  . Coronary stent placement     Social History:  reports that he has never smoked. He has never used  smokeless tobacco. He reports that he does not drink alcohol or use illicit drugs.  Allergies: Allergies  Allergen Reactions  . Amiodarone     Neuropathy  . Penicillins Itching    Has patient had a PCN reaction causing immediate rash, facial/tongue/throat swelling, SOB or lightheadedness with hypotension: Nounknown Has patient had a PCN reaction causing severe rash involving mucus membranes or skin necrosis: Nounknown Has patient had a PCN reaction that required hospitalization Nono Has patient had a PCN reaction occurring within the last 10 years: Nono If all of the above answers are "NO", then may proceed with Ceph    Family History:  Family History  Problem Relation Age of Onset  . Adopted: Yes  . Heart attack Mother   . Heart attack Father   . Diabetes Other   . Stroke Other     Prior to Admission medications   Medication Sig Start Date End Date Taking? Authorizing Provider  acetaminophen (TYLENOL) 500 MG tablet Take 1-2 tablets (500-1,000 mg total) by mouth 2 (two) times daily as needed. Patient taking differently: Take 500-1,000 mg by mouth 2 (two) times daily as needed for moderate pain.  03/28/16  Yes Tammy Eckard, PHARMD  amLODipine (NORVASC) 10 MG tablet TAKE 1 TABLET ONCE A DAY 05/15/15  Yes Minus Breeding, MD  CALCIUM PO Take 600 mg by mouth daily.    Yes Historical Provider, MD  carvedilol (COREG) 12.5 MG tablet TAKE (1) TABLET TWICE A DAY. 11/23/15  Yes Claretta Fraise, MD  cholecalciferol (VITAMIN D) 1000 units tablet Take 1,000 Units  by mouth daily.   Yes Historical Provider, MD  escitalopram (LEXAPRO) 5 MG tablet Take 1 tablet (5 mg total) by mouth daily. 03/04/16  Yes Claretta Fraise, MD  fenofibrate micronized (LOFIBRA) 134 MG capsule TAKE (1) CAPSULE DAILY BEFORE BREAKFAST. 01/03/16  Yes Claretta Fraise, MD  fish oil-omega-3 fatty acids 1000 MG capsule Take 1,400 mg by mouth daily.    Yes Historical Provider, MD  gabapentin (NEURONTIN) 600 MG tablet Take 1 tablet (600 mg  total) by mouth 3 (three) times daily. 03/13/16  Yes Claretta Fraise, MD  hydrochlorothiazide (MICROZIDE) 12.5 MG capsule Take 1 capsule (12.5 mg total) by mouth daily. For fluid, BP 03/04/16  Yes Claretta Fraise, MD  indomethacin (INDOCIN) 25 MG capsule TAKE  (1)  CAPSULE THREE TIMES DAILY AS NEEDED. 03/18/16  Yes Claretta Fraise, MD  levothyroxine (SYNTHROID, LEVOTHROID) 150 MCG tablet TAKE 1 TABLET DAILY 11/13/15  Yes Claretta Fraise, MD  lisinopril (PRINIVIL,ZESTRIL) 20 MG tablet Take 1 tablet by mouth daily. 02/14/16  Yes Historical Provider, MD  NITROSTAT 0.4 MG SL tablet Place 1 tablet (0.4 mg total) under the tongue every 5 (five) minutesas needed for chest pain. 11/14/15  Yes Claretta Fraise, MD  traMADol (ULTRAM) 50 MG tablet TAKE 1 TABLET EVERY 6 HOURS AS NEEDED FOR MODERATE TO SEVERE PAIN 03/26/16  Yes Claretta Fraise, MD  warfarin (COUMADIN) 5 MG tablet Take 7.5mg  = 1 and 1/2 tablets every Monday.  Take 5mg  = 1 tablet all other days. 03/06/16  Yes Tammy Eckard, PHARMD  furosemide (LASIX) 20 MG tablet Take 1 tablet (20 mg total) by mouth daily. Patient not taking: Reported on 04/01/2016 03/31/16   Sherwood Gambler, MD  zolpidem (AMBIEN) 10 MG tablet TAKE ONE TABLET AT BEDTIME Patient not taking: Reported on 04/01/2016 03/26/16   Claretta Fraise, MD    Review of Systems:  Constitutional: Denies fever, chills, diaphoresis, appetite change and fatigue.  HEENT: Denies photophobia, eye pain, redness, hearing loss, ear pain, congestion, sore throat, rhinorrhea, sneezing, mouth sores, trouble swallowing, neck pain, neck stiffness and tinnitus.   Respiratory: Denies  chest tightness,  and wheezing.   Cardiovascular: Denies chest pain, palpitations and leg swelling.  Gastrointestinal: Denies nausea, vomiting, abdominal pain, diarrhea, constipation, blood in stool and abdominal distention.  Genitourinary: Denies dysuria, urgency, frequency, hematuria, flank pain and difficulty urinating.  Endocrine: Denies: hot or cold  intolerance, sweats, changes in hair or nails, polyuria, polydipsia. Musculoskeletal: Denies myalgias, back pain, joint swelling, arthralgias and gait problem.  Skin: Denies pallor, rash and wound.  Neurological: Denies dizziness, seizures, syncope, weakness, light-headedness, numbness and headaches.  Hematological: Denies adenopathy. Easy bruising, personal or family bleeding history  Psychiatric/Behavioral: Denies suicidal ideation, mood changes, confusion, nervousness, sleep disturbance and agitation    Physical Exam: Filed Vitals:   04/01/16 1100 04/01/16 1230 04/01/16 1358 04/01/16 1417  BP: 159/100 157/49 169/77 169/77  Pulse:   75 75  Temp:   97.8 F (36.6 C) 97.8 F (36.6 C)  TempSrc:   Oral Oral  Resp: 19 17 18    Height:   5\' 6"  (1.676 m) 5\' 6"  (1.676 m)  Weight:   76.159 kg (167 lb 14.4 oz) 76.159 kg (167 lb 14.4 oz)  SpO2: 86% 90% 95% 95%     Constitutional: NAD, calm, comfortable Eyes: PERRL, lids and conjunctivae normal ENMT: Mucous membranes are moist. Posterior pharynx clear of any exudate or lesions.Normal dentition.  Neck: normal, supple, no masses, no thyromegaly Respiratory:Bilateral crackles, good air movement Cardiovascular: Regular rate and  rhythm, no murmurs / rubs / gallops. No extremity edema. 2+ pedal pulses. No carotid bruits.  Abdomen: no tenderness, no masses palpated. No hepatosplenomegaly. Bowel sounds positive.  Musculoskeletal: 2+ pitting edema bilaterally Skin: no rashes, lesions, ulcers. No induration Neurologic: CN 2-12 grossly intact. Sensation intact, DTR normal. Strength 5/5 in all 4.  Psychiatric: Normal judgment and insight. Alert and oriented x 3. Normal mood.    Labs on Admission: I have personally reviewed the following labs and imaging studies  CBC:  Recent Labs Lab 03/31/16 1115 04/01/16 0932  WBC 5.3  --   NEUTROABS 3.5  --   HGB 12.0* 13.6  HCT 37.3* 40.0  MCV 79.9  --   PLT 339  --    Basic Metabolic  Panel:  Recent Labs Lab 03/31/16 1115 04/01/16 0932  NA 136 138  K 3.8 3.9  CL 104 100*  CO2 25  --   GLUCOSE 144* 150*  BUN 32* 26*  CREATININE 1.29* 1.20  CALCIUM 8.5*  --    GFR: Estimated Creatinine Clearance: 38.4 mL/min (by C-G formula based on Cr of 1.2). Liver Function Tests:  Recent Labs Lab 03/31/16 1115  AST 13*  ALT 8*  ALKPHOS 61  BILITOT 0.6  PROT 7.3  ALBUMIN 3.8   No results for input(s): LIPASE, AMYLASE in the last 168 hours. No results for input(s): AMMONIA in the last 168 hours. Coagulation Profile:  Recent Labs Lab 03/28/16 1511 03/31/16 1115  INR 2.6* 1.84*   Cardiac Enzymes:  Recent Labs Lab 03/31/16 1115  TROPONINI <0.03   BNP (last 3 results) No results for input(s): PROBNP in the last 8760 hours. HbA1C: No results for input(s): HGBA1C in the last 72 hours. CBG: No results for input(s): GLUCAP in the last 168 hours. Lipid Profile: No results for input(s): CHOL, HDL, LDLCALC, TRIG, CHOLHDL, LDLDIRECT in the last 72 hours. Thyroid Function Tests: No results for input(s): TSH, T4TOTAL, FREET4, T3FREE, THYROIDAB in the last 72 hours. Anemia Panel: No results for input(s): VITAMINB12, FOLATE, FERRITIN, TIBC, IRON, RETICCTPCT in the last 72 hours. Urine analysis:    Component Value Date/Time   COLORURINE YELLOW 01/28/2016 1020   APPEARANCEUR CLEAR 01/28/2016 1020   LABSPEC 1.010 01/28/2016 1020   PHURINE 6.5 01/28/2016 1020   GLUCOSEU NEGATIVE 01/28/2016 1020   HGBUR NEGATIVE 01/28/2016 1020   BILIRUBINUR NEGATIVE 01/28/2016 1020   BILIRUBINUR small 11/10/2015 1149   KETONESUR NEGATIVE 01/28/2016 1020   PROTEINUR TRACE* 01/28/2016 1020   PROTEINUR neg 11/10/2015 1149   UROBILINOGEN negative 11/10/2015 1149   UROBILINOGEN 0.2 03/26/2015 1140   NITRITE NEGATIVE 01/28/2016 1020   NITRITE neg 11/10/2015 1149   LEUKOCYTESUR NEGATIVE 01/28/2016 1020   Sepsis Labs: @LABRCNTIP (procalcitonin:4,lacticidven:4) )No results found  for this or any previous visit (from the past 240 hour(s)).   Radiological Exams on Admission: Dg Chest Portable 1 View  04/01/2016  CLINICAL DATA:  Shortness of breath, leg swelling EXAM: PORTABLE CHEST 1 VIEW COMPARISON:  03/31/2016 FINDINGS: The heart size and mediastinal contours are within normal limits. Both lungs are clear. The visualized skeletal structures are unremarkable. IMPRESSION: No active disease. Electronically Signed   By: Kathreen Devoid   On: 04/01/2016 09:26   Dg Chest Portable 1 View  03/31/2016  CLINICAL DATA:  Shortness of breath. EXAM: PORTABLE CHEST 1 VIEW COMPARISON:  Radiographs of January 28, 2016. FINDINGS: The heart size and mediastinal contours are within normal limits. Both lungs are clear. No pneumothorax or pleural effusion is noted.  The visualized skeletal structures are unremarkable. IMPRESSION: No acute cardiopulmonary abnormality seen. Electronically Signed   By: Marijo Conception, M.D.   On: 03/31/2016 12:12    EKG: Independently reviewed. Atrial fibrillation, left bundle branch block, no acute ischemic changes  Assessment/Plan Principal Problem:   Acute on chronic systolic CHF (congestive heart failure) (HCC) Active Problems:   Essential hypertension   Paroxysmal atrial fibrillation (HCC)   Long term current use of anticoagulant therapy   Hypothyroid   Acute CHF (congestive heart failure) ([place])    Acute on chronic presumed systolic CHF -Patient has a history of dilated cardiomyopathy with an ejection fraction of 40% as per echo in 2008. -Has signs of volume overload given the crackles on exam and lower extremity edema as well as orthopnea and shortness of breath.  -Will admit to telemetry, strict intake and output, daily weights, Lasix 40 mg IV twice a day, strive for negative fluid balance. -He is already on a beta blocker and ACE inhibitor which will be continued. -Repeat echo will be requested.  Hypertension -Monitor on current medications and  adjust as needed.  Persistent atrial fibrillation  -Rate controlled at present, is anticoagulated on warfarin.  Hypothyroidism -Continue Synthroid, check TSH   DVT prophylaxis: On Coumadin  Code Status: Full code  Family Communication: Patient only  Disposition Plan: Anticipate discharge home in 4872 hours  Consults called: None  Admission status: Inpatient    Time Spent: 80 minutes  Lelon Frohlich MD [facility] Pager [phone]  If 7PM-7AM, please contact night-coverage [email or web address] Password [email or web address]  04/01/2016, 5:29 PM

## 2016-04-01 NOTE — Progress Notes (Signed)
ANTICOAGULATION CONSULT NOTE - Initial Consult  Pharmacy Consult for Coumadin Indication: atrial fibrillation  Allergies  Allergen Reactions  . Amiodarone     Neuropathy  . Penicillins Itching    Has patient had a PCN reaction causing immediate rash, facial/tongue/throat swelling, SOB or lightheadedness with hypotension: Nounknown Has patient had a PCN reaction causing severe rash involving mucus membranes or skin necrosis: Nounknown Has patient had a PCN reaction that required hospitalization Nono Has patient had a PCN reaction occurring within the last 10 years: Nono If all of the above answers are "NO", then may proceed with Ceph    Patient Measurements: Height: 5\' 6"  (167.6 cm) Weight: 167 lb 14.4 oz (76.159 kg) IBW/kg (Calculated) : 63.8  Vital Signs: Temp: 97.7 F (36.5 C) (06/19 1743) Temp Source: Oral (06/19 1743) BP: 152/74 mmHg (06/19 1743) Pulse Rate: 63 (06/19 1743)  Labs:  Recent Labs  03/31/16 1115 04/01/16 0932  HGB 12.0* 13.6  HCT 37.3* 40.0  PLT 339  --   LABPROT 21.2*  --   INR 1.84*  --   CREATININE 1.29* 1.20  TROPONINI <0.03  --     Estimated Creatinine Clearance: 38.4 mL/min (by C-G formula based on Cr of 1.2).   Medical History: Past Medical History  Diagnosis Date  . Dilated cardiomyopathy ([place])     Felt to be secondary to atrial fibrillation. EF is about 40%  . Atrial fibrillation (HCC)     Persistent, treated with amiodarone and cardioversion  . Drug therapy     Chronic Coumadin therapy  . CAD (coronary artery disease)     Last catheterization in August 2008 demonstrated the LAD 40% stenosis  . Hypertension   . Dyslipidemia   . COPD (chronic obstructive pulmonary disease) ([place])   . Hypothyroidism     Possibly related to amiodarone    Medications:  See med rec  Assessment: 82 yo male with history of dilated cardiomyopathy EF of 40% per echo in 2008, has persistent atrial fibrillation on chronicxa anticoaguation with  Coumadin. Patient presents with bilateral lower extremity edema. Continue with coumadin.  Goal of Therapy: .  INR 2-3 Monitor platelets by anticoagulation protocol: Yes   Plan:  No coumadin now, has already taken 7.5mg  today Daily PT/INR Moinitor for s/s of bleeding  Isac Sarna, BS Vena [place], BCPS Clinical Pharmacist Pager [phone] 04/01/2016,6:34 PM

## 2016-04-01 NOTE — ED Notes (Signed)
Ambulated pt in hallway; pt walked with unsteady gait and stated that his feet feel swollen and that they hurt while walking; O2 sats started out at 92% and sank on return to the stretcher to 88%.

## 2016-04-01 NOTE — ED Notes (Signed)
Pt c/o continued sob and ble swelling. Pt seen in ED yesterday for same, elected to be d/c home vs admission. Pt states he feels that he needs to be admitted today.

## 2016-04-01 NOTE — ED Notes (Signed)
MD at bedside. 

## 2016-04-02 ENCOUNTER — Inpatient Hospital Stay (HOSPITAL_BASED_OUTPATIENT_CLINIC_OR_DEPARTMENT_OTHER): Payer: Medicare Other

## 2016-04-02 DIAGNOSIS — I509 Heart failure, unspecified: Secondary | ICD-10-CM

## 2016-04-02 DIAGNOSIS — I110 Hypertensive heart disease with heart failure: Secondary | ICD-10-CM | POA: Diagnosis not present

## 2016-04-02 DIAGNOSIS — Z7901 Long term (current) use of anticoagulants: Secondary | ICD-10-CM | POA: Diagnosis not present

## 2016-04-02 DIAGNOSIS — I480 Paroxysmal atrial fibrillation: Secondary | ICD-10-CM | POA: Diagnosis not present

## 2016-04-02 DIAGNOSIS — I5023 Acute on chronic systolic (congestive) heart failure: Secondary | ICD-10-CM | POA: Diagnosis not present

## 2016-04-02 DIAGNOSIS — I420 Dilated cardiomyopathy: Secondary | ICD-10-CM | POA: Diagnosis not present

## 2016-04-02 DIAGNOSIS — I5043 Acute on chronic combined systolic (congestive) and diastolic (congestive) heart failure: Secondary | ICD-10-CM | POA: Diagnosis not present

## 2016-04-02 LAB — CBC WITH DIFFERENTIAL/PLATELET
Basophils Absolute: 0 10*3/uL (ref 0.0–0.1)
Basophils Relative: 0 %
Eosinophils Absolute: 0.3 10*3/uL (ref 0.0–0.7)
Eosinophils Relative: 7 %
HEMATOCRIT: 38.6 % — AB (ref 39.0–52.0)
HEMOGLOBIN: 12.4 g/dL — AB (ref 13.0–17.0)
LYMPHS ABS: 1.5 10*3/uL (ref 0.7–4.0)
LYMPHS PCT: 33 %
MCH: 25.4 pg — AB (ref 26.0–34.0)
MCHC: 32.1 g/dL (ref 30.0–36.0)
MCV: 79.1 fL (ref 78.0–100.0)
MONOS PCT: 8 %
Monocytes Absolute: 0.4 10*3/uL (ref 0.1–1.0)
NEUTROS ABS: 2.4 10*3/uL (ref 1.7–7.7)
NEUTROS PCT: 52 %
Platelets: 384 10*3/uL (ref 150–400)
RBC: 4.88 MIL/uL (ref 4.22–5.81)
RDW: 15.1 % (ref 11.5–15.5)
WBC: 4.6 10*3/uL (ref 4.0–10.5)

## 2016-04-02 LAB — ECHOCARDIOGRAM COMPLETE
AO mean calculated velocity dopler: 129 cm/s
AOPV: 0.37 m/s
AOVTI: 51.5 cm
AV Area mean vel: 1.24 cm2
AV VEL mean LVOT/AV: 0.39
AV peak Index: 0.63
AV pk vel: 205 cm/s
AV vel: 1.2
AVAREAMEANVIN: 0.67 cm2/m2
AVAREAVTI: 1.16 cm2
AVAREAVTIIND: 0.65 cm2/m2
AVG: 8 mmHg
AVPG: 17 mmHg
CHL CUP AV VALUE AREA INDEX: 0.65
E/e' ratio: 23.22
EWDT: 243 ms
FS: 22 % — AB (ref 28–44)
Height: 66 in
IVS/LV PW RATIO, ED: 1.03
LA diam end sys: 50 mm
LA diam index: 2.72 cm/m2
LA vol A4C: 88 ml
LASIZE: 50 mm
LAVOL: 111 mL
LAVOLIN: 60.3 mL/m2
LV PW d: 11.1 mm — AB (ref 0.6–1.1)
LV e' LATERAL: 4.35 cm/s
LV sys vol: 48 mL (ref 21–61)
LVDIAVOL: 83 mL (ref 62–150)
LVDIAVOLIN: 45 mL/m2
LVEEAVG: 23.22
LVEEMED: 23.22
LVOT SV: 62 mL
LVOT VTI: 19.6 cm
LVOT area: 3.14 cm2
LVOT peak VTI: 0.38 cm
LVOT peak grad rest: 2 mmHg
LVOT peak vel: 75.8 cm/s
LVOTD: 20 mm
LVSYSVOLIN: 26 mL/m2
MV Dec: 243
MV pk A vel: 111 m/s
MV pk E vel: 101 m/s
MVPG: 4 mmHg
RV TAPSE: 25.2 mm
Simpson's disk: 42
Stroke v: 35 ml
TDI e' lateral: 4.35
TDI e' medial: 3.23
Valve area: 1.2 cm2
Weight: 2627.88 oz

## 2016-04-02 LAB — PROTIME-INR
INR: 2.72 — ABNORMAL HIGH (ref 0.00–1.49)
Prothrombin Time: 28.4 seconds — ABNORMAL HIGH (ref 11.6–15.2)

## 2016-04-02 LAB — BASIC METABOLIC PANEL
ANION GAP: 10 (ref 5–15)
BUN: 29 mg/dL — ABNORMAL HIGH (ref 6–20)
CHLORIDE: 97 mmol/L — AB (ref 101–111)
CO2: 29 mmol/L (ref 22–32)
Calcium: 8.7 mg/dL — ABNORMAL LOW (ref 8.9–10.3)
Creatinine, Ser: 1.52 mg/dL — ABNORMAL HIGH (ref 0.61–1.24)
GFR calc non Af Amer: 39 mL/min — ABNORMAL LOW (ref >60)
GFR, EST AFRICAN AMERICAN: 45 mL/min — AB (ref >60)
Glucose, Bld: 118 mg/dL — ABNORMAL HIGH (ref 65–99)
Potassium: 3.3 mmol/L — ABNORMAL LOW (ref 3.5–5.1)
Sodium: 136 mmol/L (ref 135–145)

## 2016-04-02 LAB — TROPONIN I: Troponin I: 0.03 ng/mL (ref <0.031)

## 2016-04-02 MED ORDER — WARFARIN - PHARMACIST DOSING INPATIENT
Freq: Every day | Status: DC
  Administered 2016-04-02: 18:00:00

## 2016-04-02 MED ORDER — WARFARIN SODIUM 5 MG PO TABS
5.0000 mg | ORAL_TABLET | Freq: Once | ORAL | Status: AC
  Administered 2016-04-02: 5 mg via ORAL
  Filled 2016-04-02: qty 1

## 2016-04-02 NOTE — Care Management Obs Status (Signed)
[place] NOTIFICATION   Patient Details  Name: [name] MRN: [id number] Date of Birth: 25-Feb-1926   Medicare Observation Status Notification Given:  Yes    Sherald Barge, RN 04/02/2016, 2:07 PM

## 2016-04-02 NOTE — Progress Notes (Signed)
PROGRESS NOTE    [name]  [id number] DOB: 03-23-1926 DOA: 04/01/2016 PCP: Claretta Fraise, MD     Brief Narrative:  82 year old man admitted on 6/19 with complaints of shortness of breath and orthopnea. Has a history of dilated cardiomyopathy and was found to be in acute CHF, admitted for further evaluation and management.   Assessment & Plan:   Principal Problem:   Acute on chronic combined systolic and diastolic CHF (congestive heart failure) (HCC) Active Problems:   Essential hypertension   Paroxysmal atrial fibrillation (HCC)   Long term current use of anticoagulant therapy   Hypothyroid   Acute CHF (congestive heart failure) (HCC)   Acute on chronic combined CHF -Echo: Left ventricle: The cavity size was normal. Wall thickness was  increased in a pattern of mild LVH. Systolic function was  moderately reduced. The estimated ejection fraction was in the  range of 35% to 40%. Diffuse hypokinesis. Features are consistent  with a pseudonormal left ventricular filling pattern, with  concomitant abnormal relaxation and increased filling pressure  (grade 2 diastolic dysfunction). -Doubt accuracy of intake and output, volume status appears improved, continue to strive for negative balance. -Continue current dose of Lasix 40 mg IV twice a day. -will request cardiology consultation given depressed EF for consideration of AICD. He has been having episodes of nonsustained ventricular tachycardia on telemetry. -Continue ACE inhibitor and beta blocker.  Nonsustained ventricular tachycardia -Due to depressed ejection fraction, continue beta blockade.  Persistent atrial fibrillation -Rate controlled, anticoagulated on warfarin.  Hypertension -Fair control.  Hypothyroidism -Continue Synthroid. -TSH within normal limits   DVT prophylaxis: On Coumadin Code Status: Full code Family Communication: Son and brother at bedside updated on plan of care and all questions  answered Disposition Plan: Likely home in 24-48 hours  Consultants:   Will request cardiology evaluation  Procedures:   None  Antimicrobials:   None    Subjective: Feels improved, had some chest and left arm pain earlier today.  Objective: Filed Vitals:   04/01/16 1743 04/02/16 0144 04/02/16 0829 04/02/16 1338  BP: 152/74 108/43 136/40 145/54  Pulse: 63 82 68 80  Temp: 97.7 F (36.5 C) 98.1 F (36.7 C)  98.1 F (36.7 C)  TempSrc: Oral Oral  Oral  Resp: 18 18  20   Height:      Weight:  74.5 kg (164 lb 3.9 oz)    SpO2: 98% 92%  94%    Intake/Output Summary (Last 24 hours) at 04/02/16 1622 Last data filed at 04/02/16 1339  Gross per 24 hour  Intake    483 ml  Output   1200 ml  Net   -717 ml   Filed Weights   04/01/16 1358 04/01/16 1417 04/02/16 0144  Weight: 76.159 kg (167 lb 14.4 oz) 76.159 kg (167 lb 14.4 oz) 74.5 kg (164 lb 3.9 oz)    Examination:  General exam: Alert, awake, oriented x 3 Respiratory system: Mild bibasilar crackles Cardiovascular system:RRR. No murmurs, rubs, gallops. Gastrointestinal system: Abdomen is nondistended, soft and nontender. No organomegaly or masses felt. Normal bowel sounds heard. Central nervous system: Alert and oriented. No focal neurological deficits. Extremities: 1+ pitting edema bilaterally Skin: No rashes, lesions or ulcers Psychiatry: Judgement and insight appear normal. Mood & affect appropriate.     Data Reviewed: I have personally reviewed following labs and imaging studies  CBC:  Recent Labs Lab 03/31/16 1115 04/01/16 0932 04/02/16 0621  WBC 5.3  --  4.6  NEUTROABS 3.5  --  2.4  HGB 12.0* 13.6 12.4*  HCT 37.3* 40.0 38.6*  MCV 79.9  --  79.1  PLT 339  --  0000000   Basic Metabolic Panel:  Recent Labs Lab 03/31/16 1115 04/01/16 0932 04/02/16 0621  NA 136 138 136  K 3.8 3.9 3.3*  CL 104 100* 97*  CO2 25  --  29  GLUCOSE 144* 150* 118*  BUN 32* 26* 29*  CREATININE 1.29* 1.20 1.52*  CALCIUM  8.5*  --  8.7*   GFR: Estimated Creatinine Clearance: 30.3 mL/min (by C-G formula based on Cr of 1.52). Liver Function Tests:  Recent Labs Lab 03/31/16 1115  AST 13*  ALT 8*  ALKPHOS 61  BILITOT 0.6  PROT 7.3  ALBUMIN 3.8   No results for input(s): LIPASE, AMYLASE in the last 168 hours. No results for input(s): AMMONIA in the last 168 hours. Coagulation Profile:  Recent Labs Lab 03/28/16 1511 03/31/16 1115 04/02/16 0621  INR 2.6* 1.84* 2.72*   Cardiac Enzymes:  Recent Labs Lab 03/31/16 1115 04/02/16 0853 04/02/16 1439  TROPONINI <0.03 0.03 <0.03   BNP (last 3 results) No results for input(s): PROBNP in the last 8760 hours. HbA1C: No results for input(s): HGBA1C in the last 72 hours. CBG: No results for input(s): GLUCAP in the last 168 hours. Lipid Profile: No results for input(s): CHOL, HDL, LDLCALC, TRIG, CHOLHDL, LDLDIRECT in the last 72 hours. Thyroid Function Tests:  Recent Labs  03/31/16 1115  TSH 0.596   Anemia Panel: No results for input(s): VITAMINB12, FOLATE, FERRITIN, TIBC, IRON, RETICCTPCT in the last 72 hours. Urine analysis:    Component Value Date/Time   COLORURINE YELLOW 01/28/2016 1020   APPEARANCEUR CLEAR 01/28/2016 1020   LABSPEC 1.010 01/28/2016 1020   PHURINE 6.5 01/28/2016 1020   GLUCOSEU NEGATIVE 01/28/2016 1020   HGBUR NEGATIVE 01/28/2016 1020   BILIRUBINUR NEGATIVE 01/28/2016 1020   BILIRUBINUR small 11/10/2015 1149   KETONESUR NEGATIVE 01/28/2016 1020   PROTEINUR TRACE* 01/28/2016 1020   PROTEINUR neg 11/10/2015 1149   UROBILINOGEN negative 11/10/2015 1149   UROBILINOGEN 0.2 03/26/2015 1140   NITRITE NEGATIVE 01/28/2016 1020   NITRITE neg 11/10/2015 1149   LEUKOCYTESUR NEGATIVE 01/28/2016 1020   Sepsis Labs: @LABRCNTIP (procalcitonin:4,lacticidven:4)  )No results found for this or any previous visit (from the past 240 hour(s)).       Radiology Studies: Dg Chest Portable 1 View  04/01/2016  CLINICAL DATA:   Shortness of breath, leg swelling EXAM: PORTABLE CHEST 1 VIEW COMPARISON:  03/31/2016 FINDINGS: The heart size and mediastinal contours are within normal limits. Both lungs are clear. The visualized skeletal structures are unremarkable. IMPRESSION: No active disease. Electronically Signed   By: Kathreen Devoid   On: 04/01/2016 09:26        Scheduled Meds: . amLODipine  10 mg Oral Daily  . carvedilol  12.5 mg Oral BID WC  . citalopram  20 mg Oral Daily  . fenofibrate  160 mg Oral Daily  . furosemide  40 mg Intravenous Q12H  . gabapentin  600 mg Oral TID  . levothyroxine  150 mcg Oral QAC breakfast  . lisinopril  20 mg Oral Daily  . omega-3 acid ethyl esters  1,000 mg Oral Daily  . sodium chloride flush  3 mL Intravenous Q12H  . warfarin  5 mg Oral Once  . Warfarin - Pharmacist Dosing Inpatient   Does not apply q1800   Continuous Infusions:    LOS: 1 day    Time spent: 25 minutes. Greater  than 50% of this time was spent in direct contact with the patient coordinating care.     Lelon Frohlich, MD [facility] Pager [phone]  If 7PM-7AM, please contact night-coverage [email or web address] Password [facility] 04/02/2016, 4:22 PM

## 2016-04-02 NOTE — Progress Notes (Signed)
Called to room, patient c/o "shortness of breath and burning from the center of my chest down my left arm." VS-BP 136/40, P68 per heart monitor, oxygen sats 95% room air. Patient wanted to sit up to eat breakfast, reported easing of the burning, Dr. Jerilee Hoh paged and notified, orders received and carried out.

## 2016-04-02 NOTE — Progress Notes (Signed)
ANTICOAGULATION CONSULT NOTE - follow up  Pharmacy Consult for Coumadin Indication: atrial fibrillation  Patient Measurements: Height: 5\' 6"  (167.6 cm) Weight: 164 lb 3.9 oz (74.5 kg) IBW/kg (Calculated) : 63.8  Vital Signs: Temp: 98.1 F (36.7 C) (06/20 0144) Temp Source: Oral (06/20 0144) BP: 136/40 mmHg (06/20 0829) Pulse Rate: 68 (06/20 0829)  Labs:  Recent Labs  03/31/16 1115 04/01/16 0932 04/02/16 0621  HGB 12.0* 13.6 12.4*  HCT 37.3* 40.0 38.6*  PLT 339  --  384  LABPROT 21.2*  --  28.4*  INR 1.84*  --  2.72*  CREATININE 1.29* 1.20 1.52*  TROPONINI <0.03  --   --     Estimated Creatinine Clearance: 30.3 mL/min (by C-G formula based on Cr of 1.52).   Medical History: Past Medical History  Diagnosis Date  . Dilated cardiomyopathy ([place])     Felt to be secondary to atrial fibrillation. EF is about 40%  . Atrial fibrillation (HCC)     Persistent, treated with amiodarone and cardioversion  . Drug therapy     Chronic Coumadin therapy  . CAD (coronary artery disease)     Last catheterization in August 2008 demonstrated the LAD 40% stenosis  . Hypertension   . Dyslipidemia   . COPD (chronic obstructive pulmonary disease) ([place])   . Hypothyroidism     Possibly related to amiodarone    Medications:  See med rec  Assessment: 82 yo male with history of dilated cardiomyopathy EF of 40% per echo in 2008, has persistent atrial fibrillation on chronicxa anticoaguation with Coumadin. Patient presents with bilateral lower extremity edema. Continue with coumadin. INR 2.72 today.  Goal of Therapy: .  INR 2-3 Monitor platelets by anticoagulation protocol: Yes   Plan:  Coumadin 5mg  x 1 today Daily PT/INR Moinitor for s/s of bleeding  Isac Sarna, BS Vena [place], BCPS Clinical Pharmacist Pager [phone] 04/02/2016,9:43 AM

## 2016-04-02 NOTE — Care Management Note (Signed)
Case Management Note  Patient Details  Name: [name] MRN: [id number] Date of Birth: 09/17/1926  Subjective/Objective:                  Pt is from home, lives with friend and is ind with ADL's. Pt reports ambulating ind with use of cane or walker. Pt has no HH services. Pt has PCP, drives himself to appointments, and has no difficulty affording medications. Pt has a scale. Pt plans to return home with self care.   Action/Plan: No CM needs anticipated, will cont to follow.   Expected Discharge Date:    04/03/2016              Expected Discharge Plan:  Home/Self Care  In-House Referral:  NA  Discharge planning Services  CM Consult  Post Acute Care Choice:  NA Choice offered to:  NA  DME Arranged:    DME Agency:     HH Arranged:    HH Agency:     Status of Service:  In process, will continue to follow  If discussed at Long Length of Stay Meetings, dates discussed:    Additional Comments:  Sherald Barge, RN 04/02/2016, 2:09 PM

## 2016-04-03 ENCOUNTER — Telehealth: Payer: Self-pay | Admitting: Family Medicine

## 2016-04-03 DIAGNOSIS — I10 Essential (primary) hypertension: Secondary | ICD-10-CM

## 2016-04-03 DIAGNOSIS — I480 Paroxysmal atrial fibrillation: Secondary | ICD-10-CM

## 2016-04-03 DIAGNOSIS — I5043 Acute on chronic combined systolic (congestive) and diastolic (congestive) heart failure: Secondary | ICD-10-CM | POA: Diagnosis not present

## 2016-04-03 DIAGNOSIS — Z7901 Long term (current) use of anticoagulants: Secondary | ICD-10-CM | POA: Diagnosis not present

## 2016-04-03 DIAGNOSIS — I25118 Atherosclerotic heart disease of native coronary artery with other forms of angina pectoris: Secondary | ICD-10-CM

## 2016-04-03 DIAGNOSIS — I5023 Acute on chronic systolic (congestive) heart failure: Secondary | ICD-10-CM | POA: Diagnosis not present

## 2016-04-03 DIAGNOSIS — I472 Ventricular tachycardia: Secondary | ICD-10-CM

## 2016-04-03 LAB — BASIC METABOLIC PANEL
ANION GAP: 9 (ref 5–15)
BUN: 33 mg/dL — ABNORMAL HIGH (ref 6–20)
CALCIUM: 8.8 mg/dL — AB (ref 8.9–10.3)
CO2: 29 mmol/L (ref 22–32)
CREATININE: 1.5 mg/dL — AB (ref 0.61–1.24)
Chloride: 97 mmol/L — ABNORMAL LOW (ref 101–111)
GFR calc non Af Amer: 40 mL/min — ABNORMAL LOW (ref >60)
GFR, EST AFRICAN AMERICAN: 46 mL/min — AB (ref >60)
GLUCOSE: 123 mg/dL — AB (ref 65–99)
Potassium: 3.6 mmol/L (ref 3.5–5.1)
SODIUM: 135 mmol/L (ref 135–145)

## 2016-04-03 LAB — PROTIME-INR
INR: 2.98 — AB (ref 0.00–1.49)
Prothrombin Time: 30.4 seconds — ABNORMAL HIGH (ref 11.6–15.2)

## 2016-04-03 LAB — MAGNESIUM: MAGNESIUM: 2.2 mg/dL (ref 1.7–2.4)

## 2016-04-03 MED ORDER — WARFARIN SODIUM 5 MG PO TABS: 5.0000 mg | ORAL_TABLET | Freq: Once | ORAL | Status: DC

## 2016-04-03 MED ORDER — FUROSEMIDE 20 MG PO TABS: 40.0000 mg | ORAL_TABLET | Freq: Every day | ORAL | Status: DC

## 2016-04-03 NOTE — Discharge Summary (Signed)
Physician Discharge Summary  [name] [id number] DOB: Apr 08, 1926 DOA: 04/01/2016  PCP: Claretta Fraise, MD  Admit date: 04/01/2016 Discharge date: 04/03/2016  Time spent: 45 minutes  Recommendations for Outpatient Follow-up:  -Will be discharged home today. -Advised to follow-up with primary care provider 2 weeks.   Discharge Diagnoses:  Principal Problem:   Acute on chronic combined systolic and diastolic CHF (congestive heart failure) (HCC) Active Problems:   Essential hypertension   Paroxysmal atrial fibrillation (HCC)   Long term current use of anticoagulant therapy   Hypothyroid   Acute CHF (congestive heart failure) ([place])   Discharge Condition: Stable and improved  Filed Weights   04/01/16 1417 04/02/16 0144 04/03/16 0600  Weight: 76.159 kg (167 lb 14.4 oz) 74.5 kg (164 lb 3.9 oz) 74.844 kg (165 lb)    History of present illness:  [name] is a 82 y.o. male with history of dilated cardiomyopathy with ejection fraction of 40% as per echo in 2008, persistent atrial fibrillation on chronic anticoagulation with Coumadin, hypertension, hyperlipidemia, hypothyroidism who presents to hospital with the above complaints. This began approximately 5 days ago and has gotten progressively worse. He is having difficulty lying down flat and is requiring multiple pillows at nighttime. He comes into the hospital today for his found to have bilateral lower extremity edema, a BMP of over 1000, hypoxemia with O2 sats of 86% on room air. Admission has been requested for further evaluation and management.  Hospital Course:   Acute on chronic combined CHF -Echo: Left ventricle: The cavity size was normal. Wall thickness was  increased in a pattern of mild LVH. Systolic function was  moderately reduced. The estimated ejection fraction was in the  range of 35% to 40%. Diffuse hypokinesis. Features are consistent  with a pseudonormal left ventricular filling pattern, with   concomitant abnormal relaxation and increased filling pressure  (grade 2 diastolic dysfunction). -Is 2.6 L negative since admission. -Will discharge home today on Lasix 40 mg daily -With depressed ejection fraction, cardiology consultation was obtained to make sure no further cardiac testing or ICD placement was necessary. They do not believe that anything is needed acutely and will schedule follow-up in the outpatient setting .-Continue ACE inhibitor and beta blocker.  Nonsustained ventricular tachycardia -Due to depressed ejection fraction, continue beta blockade.  Persistent atrial fibrillation -Rate controlled, anticoagulated on warfarin.  Hypertension -Fair control.  Hypothyroidism -Continue Synthroid. -TSH within normal limits  Procedures: 2-D echo: - Left ventricle: The cavity size was normal. Wall thickness was  increased in a pattern of mild LVH. Systolic function was  moderately reduced. The estimated ejection fraction was in the  range of 35% to 40%. Diffuse hypokinesis. Features are consistent  with a pseudonormal left ventricular filling pattern, with  concomitant abnormal relaxation and increased filling pressure   (grade 2 diastolic dysfunction).  Consultations:  Cardiology  Discharge Instructions  Discharge Instructions    Diet - low sodium heart healthy    Complete by:  As directed      Increase activity slowly    Complete by:  As directed             Medication List    STOP taking these medications        hydrochlorothiazide 12.5 MG capsule  Commonly known as:  MICROZIDE     indomethacin 25 MG capsule  Commonly known as:  INDOCIN     zolpidem 10 MG tablet  Commonly known as:  AMBIEN  TAKE these medications        acetaminophen 500 MG tablet  Commonly known as:  TYLENOL  Take 1-2 tablets (500-1,000 mg total) by mouth 2 (two) times daily as needed.     amLODipine 10 MG tablet  Commonly known as:  NORVASC  TAKE 1 TABLET ONCE  A DAY     CALCIUM PO  Take 600 mg by mouth daily.     carvedilol 12.5 MG tablet  Commonly known as:  COREG  TAKE (1) TABLET TWICE A DAY.     cholecalciferol 1000 units tablet  Commonly known as:  VITAMIN D  Take 1,000 Units by mouth daily.     escitalopram 5 MG tablet  Commonly known as:  LEXAPRO  Take 1 tablet (5 mg total) by mouth daily.     fenofibrate micronized 134 MG capsule  Commonly known as:  LOFIBRA  TAKE (1) CAPSULE DAILY BEFORE BREAKFAST.     fish oil-omega-3 fatty acids 1000 MG capsule  Take 1,400 mg by mouth daily.     furosemide 20 MG tablet  Commonly known as:  LASIX  Take 2 tablets (40 mg total) by mouth daily.     gabapentin 600 MG tablet  Commonly known as:  NEURONTIN  Take 1 tablet (600 mg total) by mouth 3 (three) times daily.     levothyroxine 150 MCG tablet  Commonly known as:  SYNTHROID, LEVOTHROID  TAKE 1 TABLET DAILY     lisinopril 20 MG tablet  Commonly known as:  PRINIVIL,ZESTRIL  Take 1 tablet by mouth daily.     NITROSTAT 0.4 MG SL tablet  Generic drug:  nitroGLYCERIN  Place 1 tablet (0.4 mg total) under the tongue every 5 (five) minutesas needed for chest pain.     traMADol 50 MG tablet  Commonly known as:  ULTRAM  TAKE 1 TABLET EVERY 6 HOURS AS NEEDED FOR MODERATE TO SEVERE PAIN     warfarin 5 MG tablet  Commonly known as:  COUMADIN  Take 7.5mg  = 1 and 1/2 tablets every Monday.  Take 5mg  = 1 tablet all other days.       Allergies  Allergen Reactions  . Amiodarone     Neuropathy  . Penicillins Itching    Has patient had a PCN reaction causing immediate rash, facial/tongue/throat swelling, SOB or lightheadedness with hypotension: Nounknown Has patient had a PCN reaction causing severe rash involving mucus membranes or skin necrosis: Nounknown Has patient had a PCN reaction that required hospitalization Nono Has patient had a PCN reaction occurring within the last 10 years: Nono If all of the above answers are "NO", then may  proceed with Ceph       Follow-up Information    Schedule an appointment as soon as possible for a visit with Claretta Fraise, MD.   Specialty:  Family Medicine   Contact information:   [place] [id number] [phone]        The results of significant diagnostics from this hospitalization (including imaging, microbiology, ancillary and laboratory) are listed below for reference.    Significant Diagnostic Studies: Dg Ankle Complete Right  03/25/2016  CLINICAL DATA:  82 year old male with fall several weeks ago. Right lower extremity pain and swelling. Initial encounter. EXAM: RIGHT ANKLE - COMPLETE 3+ VIEW COMPARISON:  Right foot series 612 2016. FINDINGS: Generalized soft tissue swelling in increased density in the visualized right lower extremity. Superimposed calcified peripheral vascular disease. No subcutaneous gas. No radiopaque foreign body identified. Mortise  joint alignment appears normally located. No acute fracture or dislocation identified in the distal tibia or fibula. Mild degenerative spurring of the talus and calcaneus. No acute osseous abnormality identified. IMPRESSION: Generalized soft tissue swelling with probable subcutaneous edema. No acute osseous abnormality identified. Electronically Signed   By: Genevie Ann M.D.   On: 03/25/2016 12:42   Dg Chest Portable 1 View  04/01/2016  CLINICAL DATA:  Shortness of breath, leg swelling EXAM: PORTABLE CHEST 1 VIEW COMPARISON:  03/31/2016 FINDINGS: The heart size and mediastinal contours are within normal limits. Both lungs are clear. The visualized skeletal structures are unremarkable. IMPRESSION: No active disease. Electronically Signed   By: Kathreen Devoid   On: 04/01/2016 09:26   Dg Chest Portable 1 View  03/31/2016  CLINICAL DATA:  Shortness of breath. EXAM: PORTABLE CHEST 1 VIEW COMPARISON:  Radiographs of January 28, 2016. FINDINGS: The heart size and mediastinal contours are within normal limits. Both lungs are clear.  No pneumothorax or pleural effusion is noted. The visualized skeletal structures are unremarkable. IMPRESSION: No acute cardiopulmonary abnormality seen. Electronically Signed   By: Marijo Conception, M.D.   On: 03/31/2016 12:12    Microbiology: No results found for this or any previous visit (from the past 240 hour(s)).   Labs: Basic Metabolic Panel:  Recent Labs Lab 03/31/16 1115 04/01/16 0932 04/02/16 0621 04/03/16 0540  NA 136 138 136 135  K 3.8 3.9 3.3* 3.6  CL 104 100* 97* 97*  CO2 25  --  29 29  GLUCOSE 144* 150* 118* 123*  BUN 32* 26* 29* 33*  CREATININE 1.29* 1.20 1.52* 1.50*  CALCIUM 8.5*  --  8.7* 8.8*  MG  --   --   --  2.2   Liver Function Tests:  Recent Labs Lab 03/31/16 1115  AST 13*  ALT 8*  ALKPHOS 61  BILITOT 0.6  PROT 7.3  ALBUMIN 3.8   No results for input(s): LIPASE, AMYLASE in the last 168 hours. No results for input(s): AMMONIA in the last 168 hours. CBC:  Recent Labs Lab 03/31/16 1115 04/01/16 0932 04/02/16 0621  WBC 5.3  --  4.6  NEUTROABS 3.5  --  2.4  HGB 12.0* 13.6 12.4*  HCT 37.3* 40.0 38.6*  MCV 79.9  --  79.1  PLT 339  --  384   Cardiac Enzymes:  Recent Labs Lab 03/31/16 1115 04/02/16 0853 04/02/16 1439 04/02/16 2013  TROPONINI <0.03 0.03 <0.03 <0.03   BNP: BNP (last 3 results)  Recent Labs  03/31/16 1115  BNP 1015.0*    ProBNP (last 3 results) No results for input(s): PROBNP in the last 8760 hours.  CBG: No results for input(s): GLUCAP in the last 168 hours.     SignedLelon Frohlich  [facility] Pager: [phone] 04/03/2016, 3:25 PM

## 2016-04-03 NOTE — Consult Note (Signed)
Cardiology Consultation   Patient ID: [name]; [id number]; [birth year]   Admit date: 04/01/2016 Date of Consult: 04/03/2016  Referring MD:  Dr. Jerilee Hoh Cardiologist: Dr. Percival Spanish Consulting Cardiologist: Dr. Bronson Ing  Patient Care Team: Claretta Fraise, MD as PCP - General (Family Medicine) Jacelyn Pi, MD as Attending Physician (Endocrinology) Minus Breeding, MD as Consulting Physician (Cardiology) Druscilla Brownie, MD as Consulting Physician (Dermatology)    Reason for Consultation: CHF/NSVT   History of Present Illness: [name] is a 82 y.o. male with a hx of chronic systolic and diastolic CHF admitted with CHF. He hasn't seen Dr. Percival Spanish since 2015. He has history of CAD S/P remote stent LAD, last cath 2008 40% LAD, 50% Dx, 40-50% Cfx, 70% OM1, 30% ostial and prox RCA. Negative nuclear stress test 12/2013 EF 61%. PAF was on Amio(stopped) and Coumadin. Echo 04/02/16 EF 35-40% diffuse HK, grade 2 DD. Now having NSVT on telemetry. EF 40% on echo in 2008.  Patient says 3 weeks ago he slid and fell down an embankment while weed eating. His knees swelled up and then his legs began to swell. He didn't have much shortness of breath, denies chest tightness, pressure, palpitations, dizziness, presyncope or syncope.  Past Medical History  Diagnosis Date  . Dilated cardiomyopathy ([place])     Felt to be secondary to atrial fibrillation. EF is about 40%  . Atrial fibrillation (HCC)     Persistent, treated with amiodarone and cardioversion  . Drug therapy     Chronic Coumadin therapy  . CAD (coronary artery disease)     Last catheterization in August 2008 demonstrated the LAD 40% stenosis  . Hypertension   . Dyslipidemia   . COPD (chronic obstructive pulmonary disease) ([place])   . Hypothyroidism     Possibly related to amiodarone    Past Surgical History  Procedure Laterality Date  . Hip surgery Left     8yo  . Cardiac catheterization  05/2007    LAD 40% stenosis. Stent in  the LAD had 20% in-stent restenosis. Followed by 40-50% distal stenosis. 50% diagonal stenosis. 40-50% circumflex stenosis. 70% OM1 stenosis and 30% ostial RCA stenosis. 30% proximal RCA stenosis.  . Coronary stent placement        Home Meds: Prior to Admission medications   Medication Sig Start Date End Date Taking? Authorizing Provider  acetaminophen (TYLENOL) 500 MG tablet Take 1-2 tablets (500-1,000 mg total) by mouth 2 (two) times daily as needed. Patient taking differently: Take 500-1,000 mg by mouth 2 (two) times daily as needed for moderate pain.  03/28/16  Yes Tammy Eckard, PHARMD  amLODipine (NORVASC) 10 MG tablet TAKE 1 TABLET ONCE A DAY 05/15/15  Yes Minus Breeding, MD  CALCIUM PO Take 600 mg by mouth daily.    Yes Historical Provider, MD  carvedilol (COREG) 12.5 MG tablet TAKE (1) TABLET TWICE A DAY. 11/23/15  Yes Claretta Fraise, MD  cholecalciferol (VITAMIN D) 1000 units tablet Take 1,000 Units by mouth daily.   Yes Historical Provider, MD  escitalopram (LEXAPRO) 5 MG tablet Take 1 tablet (5 mg total) by mouth daily. 03/04/16  Yes Claretta Fraise, MD  fenofibrate micronized (LOFIBRA) 134 MG capsule TAKE (1) CAPSULE DAILY BEFORE BREAKFAST. 01/03/16  Yes Claretta Fraise, MD  fish oil-omega-3 fatty acids 1000 MG capsule Take 1,400 mg by mouth daily.    Yes Historical Provider, MD  gabapentin (NEURONTIN) 600 MG tablet Take 1 tablet (600 mg total) by mouth 3 (three) times daily. 03/13/16  Yes Claretta Fraise, MD  hydrochlorothiazide (MICROZIDE) 12.5 MG capsule Take 1 capsule (12.5 mg total) by mouth daily. For fluid, BP 03/04/16  Yes Claretta Fraise, MD  indomethacin (INDOCIN) 25 MG capsule TAKE  (1)  CAPSULE THREE TIMES DAILY AS NEEDED. 03/18/16  Yes Claretta Fraise, MD  levothyroxine (SYNTHROID, LEVOTHROID) 150 MCG tablet TAKE 1 TABLET DAILY 11/13/15  Yes Claretta Fraise, MD  lisinopril (PRINIVIL,ZESTRIL) 20 MG tablet Take 1 tablet by mouth daily. 02/14/16  Yes Historical Provider, MD  NITROSTAT 0.4 MG SL  tablet Place 1 tablet (0.4 mg total) under the tongue every 5 (five) minutesas needed for chest pain. 11/14/15  Yes Claretta Fraise, MD  traMADol (ULTRAM) 50 MG tablet TAKE 1 TABLET EVERY 6 HOURS AS NEEDED FOR MODERATE TO SEVERE PAIN 03/26/16  Yes Claretta Fraise, MD  warfarin (COUMADIN) 5 MG tablet Take 7.5mg  = 1 and 1/2 tablets every Monday.  Take 5mg  = 1 tablet all other days. 03/06/16  Yes Tammy Eckard, PHARMD  furosemide (LASIX) 20 MG tablet Take 1 tablet (20 mg total) by mouth daily. Patient not taking: Reported on 04/01/2016 03/31/16   Sherwood Gambler, MD  zolpidem (AMBIEN) 10 MG tablet TAKE ONE TABLET AT BEDTIME Patient not taking: Reported on 04/01/2016 03/26/16   Claretta Fraise, MD    Current Medications: . amLODipine  10 mg Oral Daily  . carvedilol  12.5 mg Oral BID WC  . citalopram  20 mg Oral Daily  . fenofibrate  160 mg Oral Daily  . furosemide  40 mg Intravenous Q12H  . gabapentin  600 mg Oral TID  . levothyroxine  150 mcg Oral QAC breakfast  . lisinopril  20 mg Oral Daily  . omega-3 acid ethyl esters  1,000 mg Oral Daily  . sodium chloride flush  3 mL Intravenous Q12H  . Warfarin - Pharmacist Dosing Inpatient   Does not apply q1800     Allergies:    Allergies  Allergen Reactions  . Amiodarone     Neuropathy  . Penicillins Itching    Has patient had a PCN reaction causing immediate rash, facial/tongue/throat swelling, SOB or lightheadedness with hypotension: Nounknown Has patient had a PCN reaction causing severe rash involving mucus membranes or skin necrosis: Nounknown Has patient had a PCN reaction that required hospitalization Nono Has patient had a PCN reaction occurring within the last 10 years: Nono If all of the above answers are "NO", then may proceed with Ceph    Social History:   The patient  reports that he has never smoked. He has never used smokeless tobacco. He reports that he does not drink alcohol or use illicit drugs.    Family History:   The patient's family  history includes Diabetes in his other; Heart attack in his father and mother; Stroke in his other. He was adopted.   ROS:  Please see the history of present illness.  Review of Systems  Constitution: Positive for weakness.  HENT: Positive for hearing loss.   Cardiovascular: Positive for dyspnea on exertion and leg swelling.  Musculoskeletal: Positive for joint swelling and muscle weakness.   All other ROS reviewed and negative.      Vital Signs: Blood pressure 150/115, pulse 69, temperature 97.9 F (36.6 C), temperature source Oral, resp. rate 18, height 5\' 6"  (1.676 m), weight 165 lb (74.844 kg), SpO2 95 %.   PHYSICAL EXAM: General:  Well nourished, well developed, in no acute distress  HEENT: normal Lymph: no adenopathy Neck: no JVD  Endocrine:  No thryomegaly Vascular: No  carotid bruits; FA pulses 2+ bilaterally without bruits Cardiac:  RRR; normal S1, [id number] systolic murmur LSB,no rub, bruit, thrill, or heave Lungs:  clear to auscultation bilaterally, no wheezing, rhonchi or rales Abd: soft, nontender, no hepatomegaly Ext: no edema, Good distal pulses bilaterally Musculoskeletal:  No deformities, BUE and BLE strength normal and equal Skin: warm and dry Neuro:  CNs 2-12 intact, no focal abnormalities noted Psych:  Normal affect    EKG:  NSR with frequent PVC's  Telemetry: NSR with PVC's 3-4 beat runs  NSVT Labs:  Recent Labs  03/31/16 1115 04/02/16 0853 04/02/16 1439 04/02/16 2013  TROPONINI <0.03 0.03 <0.03 <0.03   Lab Results  Component Value Date   WBC 4.6 04/02/2016   HGB 12.4* 04/02/2016   HCT 38.6* 04/02/2016   MCV 79.1 04/02/2016   PLT 384 04/02/2016    Recent Labs Lab 03/31/16 1115  04/03/16 0540  NA 136  < > 135  K 3.8  < > 3.6  CL 104  < > 97*  CO2 25  < > 29  BUN 32*  < > 33*  CREATININE 1.29*  < > 1.50*  CALCIUM 8.5*  < > 8.8*  PROT 7.3  --   --   BILITOT 0.6  --   --   ALKPHOS 61  --   --   ALT 8*  --   --   AST 13*  --   --     GLUCOSE 144*  < > 123*  < > = values in this interval not displayed. Lab Results  Component Value Date   CHOL 188 12/26/2015   HDL 25* 12/26/2015   LDLCALC 91 12/26/2015   TRIG 360* 12/26/2015   Lab Results  Component Value Date   DDIMER  05/14/2007    <0.22        AT THE INHOUSE ESTABLISHED CUTOFF VALUE OF 0.48 ug/mL FEU, THIS ASSAY HAS BEEN DOCUMENTED IN THE LITERATURE TO HAVE    Radiology/Studies:  Dg Knee 1-2 Views Left  03/04/2016  CLINICAL DATA:  pain EXAM: LEFT KNEE - 1-2 VIEW COMPARISON:  01/03/2016 FINDINGS: Artifact degradation on the frontal view. No acute fracture or dislocation. Minimal patellofemoral degenerative change. Intraarticular loose bodies within the anterior lateral aspect of the joint. Vascular calcifications. Small suprapatellar joint effusion. IMPRESSION: Minimal osteoarthritis with small suprapatellar joint effusion. Degraded frontal view. Electronically Signed   By: Abigail Miyamoto M.D.   On: 03/04/2016 14:00   Dg Knee 1-2 Views Right  03/04/2016  CLINICAL DATA:  bil knee pain EXAM: RIGHT KNEE - 1-2 VIEW COMPARISON:  None. FINDINGS: AP and lateral views. Vascular calcifications. No acute fracture or dislocation. Joint spaces are maintained for age. Possible small suprapatellar joint effusion. Presumed artifact from overlying clothing. IMPRESSION: No acute osseous abnormality. Possible small suprapatellar joint effusion. Electronically Signed   By: Abigail Miyamoto M.D.   On: 03/04/2016 13:58   Dg Ankle Complete Right  03/25/2016  CLINICAL DATA:  82 year old male with fall several weeks ago. Right lower extremity pain and swelling. Initial encounter. EXAM: RIGHT ANKLE - COMPLETE 3+ VIEW COMPARISON:  Right foot series 612 2016. FINDINGS: Generalized soft tissue swelling in increased density in the visualized right lower extremity. Superimposed calcified peripheral vascular disease. No subcutaneous gas. No radiopaque foreign body identified. Mortise joint alignment  appears normally located. No acute fracture or dislocation identified in the distal tibia or fibula. Mild degenerative spurring of the talus and calcaneus. No acute osseous abnormality identified. IMPRESSION:  Generalized soft tissue swelling with probable subcutaneous edema. No acute osseous abnormality identified. Electronically Signed   By: Genevie Ann M.D.   On: 03/25/2016 12:42   Dg Chest Portable 1 View  04/01/2016  CLINICAL DATA:  Shortness of breath, leg swelling EXAM: PORTABLE CHEST 1 VIEW COMPARISON:  03/31/2016 FINDINGS: The heart size and mediastinal contours are within normal limits. Both lungs are clear. The visualized skeletal structures are unremarkable. IMPRESSION: No active disease. Electronically Signed   By: Kathreen Devoid   On: 04/01/2016 09:26   Dg Chest Portable 1 View  03/31/2016  CLINICAL DATA:  Shortness of breath. EXAM: PORTABLE CHEST 1 VIEW COMPARISON:  Radiographs of January 28, 2016. FINDINGS: The heart size and mediastinal contours are within normal limits. Both lungs are clear. No pneumothorax or pleural effusion is noted. The visualized skeletal structures are unremarkable. IMPRESSION: No acute cardiopulmonary abnormality seen. Electronically Signed   By: Marijo Conception, M.D.   On: 03/31/2016 12:12     PROBLEM LIST:  Principal Problem:   Acute on chronic combined systolic and diastolic CHF (congestive heart failure) (HCC) Active Problems:   Essential hypertension   Paroxysmal atrial fibrillation (HCC)   Long term current use of anticoagulant therapy   Hypothyroid   Acute CHF (congestive heart failure) ([place])  2Decho 04/02/16: Study Conclusions   - Left ventricle: The cavity size was normal. Wall thickness was   increased in a pattern of mild LVH. Systolic function was   moderately reduced. The estimated ejection fraction was in the   range of 35% to 40%. Diffuse hypokinesis. Features are consistent   with a pseudonormal left ventricular filling pattern, with    concomitant abnormal relaxation and increased filling pressure   (grade 2 diastolic dysfunction). - Aortic valve: Mildly to moderately calcified annulus. Trileaflet;   moderately thickened leaflets. There was mild stenosis. Valve   area (VTI): 1.62 cm^2. - Mitral valve: Severely calcified annulus. Moderately thickened   leaflets . - Left atrium: The atrium was severely dilated. - Right ventricle: The cavity size was mildly dilated. - Right atrium: The atrium was moderately dilated. - Technically adequate study.     ASSESSMENT AND PLAN:  Acute on Chronic CHF has diuresed almost 3 L since admission. Back to baseline.EF 35-40% down from nuclear stress test last year, but similar to prior echo's. Now on Lasix 20 mg daily. Will need f/u with Dr. Percival Spanish  NSVT  Used to be on Amiodarone but stopped not sure when, possibly 2015 . Potassium stable. Patient asymptomatic, no syncope. Don't think AICD indicated. ?resume Amio?  PAF on metoprolol and Coumadin  Essential HTN  CAD with remote stent LAD, no symptoms. Could do nuclear stress test. Discuss with MD.    Signed, Ermalinda Barrios, PA-C  04/03/2016 8:11 AM   The patient was seen and examined, and I agree with the history, physical exam, assessment and plan as documented above which has been discussed with Gerrianne Scale PA-C, with modifications as noted below. Pt admitted with acute on chronic systolic and diastolic heart failure and has been diuresing adequately on IV Lasix. Denies chest pain, palpitations, dizziness, and syncope. Noted to have frequent PVC's on telemetry with couplets and triplets seen. K 3.6 today, 3.3 yesterday. Currently on Coreg and ACEI. EF 35-40% this admission, EF 40% by 2008 echocardiogram.  Does not meed AICD criteria (PTH asked [place] to evaluate regarding this). Will have patient f/u with Dr. Percival Spanish to see if he feels nuclear MPI study warranted.  Would not initiate amiodarone at this time but simply continue  Coreg. Will check serum magnesium.  Kate Sable, MD, [facility]  04/03/2016 9:44 AM

## 2016-04-03 NOTE — Progress Notes (Signed)
Pt discharged home today per Dr. Jerilee Hoh.  Pt's IV site D/C'd and WDL.  Pt's VSS.  Pt provided with home medication list, discharge instructions and prescriptions by Morene Antu.  Verbalized understanding.  Pt left floor via WC in stable condition accompanied by nursing staff.

## 2016-04-03 NOTE — Progress Notes (Signed)
ANTICOAGULATION CONSULT NOTE - follow up  Pharmacy Consult for Coumadin Indication: atrial fibrillation  Patient Measurements: Height: 5\' 6"  (167.6 cm) Weight: 165 lb (74.844 kg) IBW/kg (Calculated) : 63.8  Vital Signs: Temp: 97.6 F (36.4 C) (06/21 0800) Temp Source: Oral (06/21 0800) BP: 105/50 mmHg (06/21 0800) Pulse Rate: 67 (06/21 0800)  Labs:  Recent Labs  04/01/16 0932 04/02/16 0621 04/02/16 0853 04/02/16 1439 04/02/16 2013 04/03/16 0540  HGB 13.6 12.4*  --   --   --   --   HCT 40.0 38.6*  --   --   --   --   PLT  --  384  --   --   --   --   LABPROT  --  28.4*  --   --   --  30.4*  INR  --  2.72*  --   --   --  2.98*  CREATININE 1.20 1.52*  --   --   --  1.50*  TROPONINI  --   --  0.03 <0.03 <0.03  --     Estimated Creatinine Clearance: 30.7 mL/min (by C-G formula based on Cr of 1.5).   Medical History: Past Medical History  Diagnosis Date  . Dilated cardiomyopathy ([place])     Felt to be secondary to atrial fibrillation. EF is about 40%  . Atrial fibrillation (HCC)     Persistent, treated with amiodarone and cardioversion  . Drug therapy     Chronic Coumadin therapy  . CAD (coronary artery disease)     Last catheterization in August 2008 demonstrated the LAD 40% stenosis  . Hypertension   . Dyslipidemia   . COPD (chronic obstructive pulmonary disease) ([place])   . Hypothyroidism     Possibly related to amiodarone    Medications:  See med rec  Assessment: 82 yo male with history of dilated cardiomyopathy EF of 40% per echo in 2008, has persistent atrial fibrillation on chronicxa anticoaguation with Coumadin. Patient presents with bilateral lower extremity edema. Continue with coumadin. INR 2.98 today.  Goal of Therapy: .  INR 2-3 Monitor platelets by anticoagulation protocol: Yes   Plan:  Coumadin 5mg  x 1 today Daily PT/INR Moinitor for s/s of bleeding  Isac Sarna, BS Vena Austria, BCPS Clinical Pharmacist Pager [phone] 04/03/2016,11:47  AM

## 2016-04-04 NOTE — Telephone Encounter (Signed)
Appt given per hospital request 

## 2016-04-05 ENCOUNTER — Telehealth: Payer: Self-pay | Admitting: *Deleted

## 2016-04-05 NOTE — Telephone Encounter (Signed)
TCM phone call not completed. Patient was seen after discharge.

## 2016-04-09 ENCOUNTER — Encounter: Payer: Self-pay | Admitting: Pharmacist

## 2016-04-09 ENCOUNTER — Encounter: Payer: Self-pay | Admitting: Family Medicine

## 2016-04-09 ENCOUNTER — Other Ambulatory Visit: Payer: Self-pay | Admitting: Family Medicine

## 2016-04-09 ENCOUNTER — Ambulatory Visit (INDEPENDENT_AMBULATORY_CARE_PROVIDER_SITE_OTHER): Payer: Medicare Other | Admitting: Family Medicine

## 2016-04-09 VITALS — BP 120/72 | HR 52 | Temp 97.0°F | Ht 66.0 in | Wt 171.8 lb

## 2016-04-09 DIAGNOSIS — I480 Paroxysmal atrial fibrillation: Secondary | ICD-10-CM | POA: Diagnosis not present

## 2016-04-09 DIAGNOSIS — I5043 Acute on chronic combined systolic (congestive) and diastolic (congestive) heart failure: Secondary | ICD-10-CM

## 2016-04-09 DIAGNOSIS — Z7901 Long term (current) use of anticoagulants: Secondary | ICD-10-CM

## 2016-04-09 DIAGNOSIS — N179 Acute kidney failure, unspecified: Secondary | ICD-10-CM | POA: Diagnosis not present

## 2016-04-09 DIAGNOSIS — N189 Chronic kidney disease, unspecified: Secondary | ICD-10-CM

## 2016-04-09 LAB — CMP14+EGFR
ALK PHOS: 59 IU/L (ref 39–117)
ALT: 7 IU/L (ref 0–44)
AST: 12 IU/L (ref 0–40)
Albumin/Globulin Ratio: 1.4 (ref 1.2–2.2)
Albumin: 4.2 g/dL (ref 3.5–4.7)
BILIRUBIN TOTAL: 0.3 mg/dL (ref 0.0–1.2)
BUN/Creatinine Ratio: 31 — ABNORMAL HIGH (ref 10–24)
BUN: 61 mg/dL — AB (ref 8–27)
CO2: 23 mmol/L (ref 18–29)
Calcium: 9.4 mg/dL (ref 8.6–10.2)
Chloride: 94 mmol/L — ABNORMAL LOW (ref 96–106)
Creatinine, Ser: 1.99 mg/dL — ABNORMAL HIGH (ref 0.76–1.27)
GFR calc Af Amer: 34 mL/min/{1.73_m2} — ABNORMAL LOW (ref >59)
GFR calc non Af Amer: 29 mL/min/{1.73_m2} — ABNORMAL LOW (ref >59)
GLUCOSE: 137 mg/dL — AB (ref 65–99)
Globulin, Total: 2.9 g/dL (ref 1.5–4.5)
Potassium: 5.2 mmol/L (ref 3.5–5.2)
Sodium: 135 mmol/L (ref 134–144)
Total Protein: 7.1 g/dL (ref 6.0–8.5)

## 2016-04-09 LAB — POCT INR: INR: 3.9

## 2016-04-09 LAB — COAGUCHEK XS/INR WAIVED
INR: 3.9 — ABNORMAL HIGH (ref 0.9–1.1)
Prothrombin Time: 46.2 s

## 2016-04-09 MED ORDER — FUROSEMIDE 20 MG PO TABS: 40.0000 mg | ORAL_TABLET | Freq: Every day | ORAL | Status: DC

## 2016-04-09 NOTE — Progress Notes (Signed)
HPI The patient presents for one-year followup of his known coronary disease.   Since I last saw him last year hea has had multiple ED visits for joint pain.  He was in the hospital earlier this month and I reviewed these notes.  He had acute on chronic systolic and diastolic HF.  EF on echo was mild to moderately reduced as had been noted previously.  Our service did see him in consultation.  However, no further cardiovascular testing was suggested at that time.  He had diuresis.  He returns for follow up.  Since he was seen he's actually done relatively well. He does have a little left knee pain which was not the pain he injured when he fell which was what led to his hospitalization. He was doing some weeding eating on an incline which is not doing anymore. He denies any new shortness of breath, PND or orthopnea. He's had no chest pressure, neck or arm discomfort. He was having some lower externally swelling and pain but this has resolved. I do note that his creatinine yesterday was 1.99. He's going over to talk to his primary care doctor who has plans to reduce his Lasix.  Allergies  Allergen Reactions  . Amiodarone     Neuropathy  . Penicillins Itching    Current Outpatient Prescriptions  Medication Sig Dispense Refill  . acetaminophen (TYLENOL) 500 MG tablet Take 1-2 tablets (500-1,000 mg total) by mouth 2 (two) times daily as needed. (Patient taking differently: Take 500-1,000 mg by mouth 2 (two) times daily as needed for moderate pain. ) 30 tablet 0  . amLODipine (NORVASC) 10 MG tablet Take 10 mg by mouth daily.    Marland Kitchen CALCIUM PO Take 600 mg by mouth daily.     . carvedilol (COREG) 12.5 MG tablet TAKE (1) TABLET TWICE A DAY. 60 tablet 4  . cholecalciferol (VITAMIN D) 1000 units tablet Take 1,000 Units by mouth daily.    Marland Kitchen escitalopram (LEXAPRO) 5 MG tablet Take 1 tablet (5 mg total) by mouth daily. 30 tablet 2  . fenofibrate micronized (LOFIBRA) 134 MG capsule Take 134 mg by mouth daily  before breakfast.    . fish oil-omega-3 fatty acids 1000 MG capsule Take 1,400 mg by mouth daily.     . furosemide (LASIX) 20 MG tablet Take 2 tablets (40 mg total) by mouth daily. 60 tablet 2  . gabapentin (NEURONTIN) 600 MG tablet Take 1 tablet (600 mg total) by mouth 3 (three) times daily. 90 tablet 2  . hydrochlorothiazide (MICROZIDE) 12.5 MG capsule Take 12.5 mg by mouth daily.     . indomethacin (INDOCIN) 25 MG capsule Take 25 mg by mouth 3 (three) times daily as needed.    Marland Kitchen levothyroxine (SYNTHROID, LEVOTHROID) 150 MCG tablet Take 150 mcg by mouth daily before breakfast.    . lisinopril (PRINIVIL,ZESTRIL) 20 MG tablet Take 1 tablet by mouth daily.    Marland Kitchen NITROSTAT 0.4 MG SL tablet Place 1 tablet (0.4 mg total) under the tongue every 5 (five) minutesas needed for chest pain. 25 tablet 0  . traMADol (ULTRAM) 50 MG tablet Take 50 mg by mouth every 6 (six) hours as needed.    . warfarin (COUMADIN) 5 MG tablet Take 7.5mg  = 1 and 1/2 tablets every Monday.  Take 5mg  = 1 tablet all other days. 32 tablet 1   No current facility-administered medications for this visit.    Past Medical History  Diagnosis Date  . Dilated cardiomyopathy ([place])  Felt to be secondary to atrial fibrillation. EF is about 40%  . Atrial fibrillation (HCC)     Persistent, treated with amiodarone and cardioversion  . Drug therapy     Chronic Coumadin therapy  . CAD (coronary artery disease)     Last catheterization in August 2008 demonstrated the LAD 40% stenosis  . Hypertension   . Dyslipidemia   . COPD (chronic obstructive pulmonary disease) ([place])   . Hypothyroidism     Possibly related to amiodarone    Past Surgical History  Procedure Laterality Date  . Hip surgery Left     8yo  . Cardiac catheterization  05/2007    LAD 40% stenosis. Stent in the LAD had 20% in-stent restenosis. Followed by 40-50% distal stenosis. 50% diagonal stenosis. 40-50% circumflex stenosis. 70% OM1 stenosis and 30% ostial RCA  stenosis. 30% proximal RCA stenosis.  . Coronary stent placement      ROS:  Knee pain.  Otherwise as stated in the HPI and negative for all other systems.  PHYSICAL EXAM BP 131/58 mmHg  Pulse 54  Ht 5\' 6"  (1.676 m)  Wt 170 lb (77.111 kg)  BMI 27.45 kg/m2 GENERAL:  Well appearing NECK:  No jugular venous distention, waveform within normal limits, carotid upstroke brisk and symmetric, soft right bruits, no thyromegaly LUNGS:  Clear to auscultation bilaterally CHEST:  Unremarkable HEART:  PMI not displaced or sustained,S1 and S2 within normal limits, no S3, no S4, no clicks, no rubs, apical early peaking systolic murmur radiating out the aortic outflow tract slightly ABD:  Flat, positive bowel sounds normal in frequency in pitch, no bruits, no rebound, no guarding, no midline pulsatile mass, no hepatomegaly, no splenomegaly EXT:  2 plus pulses upper and decreased DP/PT bilateral with right greater than left edema, no cyanosis no clubbing SKIN:  No rashes no nodules, multiple bruises.    Lab Results  Component Value Date   CHOL 188 12/26/2015   TRIG 360* 12/26/2015   HDL 25* 12/26/2015   LDLCALC 91 12/26/2015    ASSESSMENT AND PLAN  ATRIAL FIBRILLATION:  He's not had any new paroxysms. He tolerates anticoagulation. No change in therapy is indicated.  CAD:   He has had no new symptoms. No further testing is indicated.   HTN:  His blood pressure is at target. No change in therapy is planned.  HYPERLIPIDEMIA:   His last LDL was 91 . He did have hypertriglyceridemia. This is followed by Claretta Fraise, MD  CAROTID STENOSIS:  Carotid Doppler demonstrated stable 40-59% bilateral stenosis.  I will arrange follow-up.  He is overdue for follow-up  LEG SWELLING:   I agree that he needs to have his Lasix dose reduced. He will have his creatinine followed closely by Claretta Fraise, [facility] records reviewed.

## 2016-04-09 NOTE — Assessment & Plan Note (Signed)
Mostly resolved, started having swelling just today. Denies any shortness of breath or chest pain or palpitations or wheezing. Will increase Lasix to 2 pills a day on the days that he swollen.

## 2016-04-09 NOTE — Patient Instructions (Signed)
Anticoagulation Dose Instructions as of 04/09/2016      Dorene Grebe Tue Wed Thu Fri Sat   New Dose 5 mg 5 mg 2.5 mg 5 mg 5 mg 5 mg 2.5 mg    Description        Skip tonight's dose and then take 2.5 mg on Tuesdays and Saturdays but 5 mg on all other days

## 2016-04-09 NOTE — Assessment & Plan Note (Signed)
Will recheck kidney function, likely was elevated during the hospital stay once they started him on IV diuretics. Recheck today and see where he is at.

## 2016-04-09 NOTE — Progress Notes (Signed)
BP 120/72 mmHg  Pulse 52  Temp(Src) 97 F (36.1 C) (Oral)  Ht '5\' 6"'$  (1.676 m)  Wt 171 lb 12.8 oz (77.928 kg)  BMI 27.74 kg/m2  SpO2 98%   Subjective:    Patient ID: [name], male    DOB: [birth year], 82 y.o.   MRN: [id number]  HPI: [name] is a 82 y.o. male presenting on 04/09/2016 for Hospitalization Follow-up   HPI Hospital follow-up for CHF and acute kidney injury Patient is coming in today for a hospital follow-up for acute kidney injury and acute on chronic CHF. During his hospitalization when she went in for because he had had weight gain and significant swelling in his lower extremities. They have given him IV Lasix and found that he had CHF exacerbation which then caused his kidney function to worsen. Both his fluid and his weight went down. He denies ever having any shortness of breath or cough or wheezing. He also has been on Coumadin for paroxysmal A. fib and they checked him there and his last INR was 2.9 but they want him to follow up closely because they did increase the amount of his Lasix and because his renal function and change. He says that he is feeling pretty good today but that his legs have started to swell up a little bit more in the past day again.  Relevant past medical, surgical, family and social history reviewed and updated as indicated. Interim medical history since our last visit reviewed. Allergies and medications reviewed and updated.  Review of Systems  Constitutional: Negative for fever and chills.  HENT: Negative for ear discharge and ear pain.   Eyes: Negative for discharge and visual disturbance.  Respiratory: Negative for shortness of breath and wheezing.   Cardiovascular: Positive for leg swelling. Negative for chest pain.  Gastrointestinal: Negative for abdominal pain, diarrhea and constipation.  Genitourinary: Negative for difficulty urinating.  Musculoskeletal: Negative for back pain and gait problem.  Skin: Negative for rash.    Neurological: Negative for dizziness, syncope, light-headedness and headaches.  All other systems reviewed and are negative.   Per HPI unless specifically indicated above     Medication List       This list is accurate as of: 04/09/16 11:59 PM.  Always use your most recent med list.               acetaminophen 500 MG tablet  Commonly known as:  TYLENOL  Take 1-2 tablets (500-1,000 mg total) by mouth 2 (two) times daily as needed.     amLODipine 10 MG tablet  Commonly known as:  NORVASC  TAKE 1 TABLET ONCE A DAY     CALCIUM PO  Take 600 mg by mouth daily.     carvedilol 12.5 MG tablet  Commonly known as:  COREG  TAKE (1) TABLET TWICE A DAY.     cholecalciferol 1000 units tablet  Commonly known as:  VITAMIN D  Take 1,000 Units by mouth daily.     escitalopram 5 MG tablet  Commonly known as:  LEXAPRO  Take 1 tablet (5 mg total) by mouth daily.     fenofibrate micronized 134 MG capsule  Commonly known as:  LOFIBRA  TAKE (1) CAPSULE DAILY BEFORE BREAKFAST.     fish oil-omega-3 fatty acids 1000 MG capsule  Take 1,400 mg by mouth daily.     furosemide 20 MG tablet  Commonly known as:  LASIX  Take 2 tablets (40 mg total) by mouth  daily.     gabapentin 600 MG tablet  Commonly known as:  NEURONTIN  Take 1 tablet (600 mg total) by mouth 3 (three) times daily.     hydrochlorothiazide 12.5 MG capsule  Commonly known as:  MICROZIDE  Take 12.5 mg by mouth daily.     indomethacin 25 MG capsule  Commonly known as:  INDOCIN  Take 25 mg by mouth 3 (three) times daily as needed.     indomethacin 25 MG capsule  Commonly known as:  INDOCIN  TAKE  (1)  CAPSULE THREE TIMES DAILY AS NEEDED.     levothyroxine 150 MCG tablet  Commonly known as:  SYNTHROID, LEVOTHROID  TAKE 1 TABLET DAILY     lisinopril 20 MG tablet  Commonly known as:  PRINIVIL,ZESTRIL  Take 1 tablet by mouth daily.     NITROSTAT 0.4 MG SL tablet  Generic drug:  nitroGLYCERIN  Place 1 tablet (0.4 mg  total) under the tongue every 5 (five) minutesas needed for chest pain.     traMADol 50 MG tablet  Commonly known as:  ULTRAM  TAKE 1 TABLET EVERY 6 HOURS AS NEEDED FOR MODERATE TO SEVERE PAIN     warfarin 5 MG tablet  Commonly known as:  COUMADIN  Take 7.'5mg'$  = 1 and 1/2 tablets every Monday.  Take '5mg'$  = 1 tablet all other days.           Objective:    BP 120/72 mmHg  Pulse 52  Temp(Src) 97 F (36.1 C) (Oral)  Ht '5\' 6"'$  (1.676 m)  Wt 171 lb 12.8 oz (77.928 kg)  BMI 27.74 kg/m2  SpO2 98%  Wt Readings from Last 3 Encounters:  04/10/16 170 lb (77.111 kg)  04/09/16 171 lb 12.8 oz (77.928 kg)  04/03/16 165 lb (74.844 kg)    Physical Exam  Constitutional: He is oriented to person, place, and time. He appears well-developed and well-nourished. No distress.  HENT:  Right Ear: External ear normal.  Left Ear: External ear normal.  Nose: Nose normal.  Mouth/Throat: Oropharynx is clear and moist.  Eyes: Conjunctivae and EOM are normal. Pupils are equal, round, and reactive to light. Right eye exhibits no discharge. No scleral icterus.  Neck: Neck supple. No thyromegaly present.  Cardiovascular: Normal rate, regular rhythm, normal heart sounds and intact distal pulses.   No murmur heard. Pulmonary/Chest: Effort normal and breath sounds normal. No respiratory distress. He has no wheezes. He has no rales. He exhibits no tenderness.  Musculoskeletal: Normal range of motion. He exhibits edema (Trace). He exhibits no tenderness.  Lymphadenopathy:    He has no cervical adenopathy.  Neurological: He is alert and oriented to person, place, and time. Coordination normal.  Skin: Skin is warm and dry. No rash noted. He is not diaphoretic.  Psychiatric: He has a normal mood and affect. His behavior is normal.  Nursing note and vitals reviewed.   Results for orders placed or performed in visit on 04/09/16  CMP14+EGFR  Result Value Ref Range   Glucose 137 (H) 65 - 99 mg/dL   BUN 61 (H) 8 -  27 mg/dL   Creatinine, Ser 1.99 (H) 0.76 - 1.27 mg/dL   GFR calc non Af Amer 29 (L) >59 mL/min/1.73   GFR calc Af Amer 34 (L) >59 mL/min/1.73   BUN/Creatinine Ratio 31 (H) 10 - 24   Sodium 135 134 - 144 mmol/L   Potassium 5.2 3.5 - 5.2 mmol/L   Chloride 94 (L) 96 - 106  mmol/L   CO2 23 18 - 29 mmol/L   Calcium 9.4 8.6 - 10.2 mg/dL   Total Protein 7.1 6.0 - 8.5 g/dL   Albumin 4.2 3.5 - 4.7 g/dL   Globulin, Total 2.9 1.5 - 4.5 g/dL   Albumin/Globulin Ratio 1.4 1.2 - 2.2   Bilirubin Total 0.3 0.0 - 1.2 mg/dL   Alkaline Phosphatase 59 39 - 117 IU/L   AST 12 0 - 40 IU/L   ALT 7 0 - 44 IU/L  CoaguChek XS/INR Waived  Result Value Ref Range   INR 3.9 (H) 0.9 - 1.1   Prothrombin Time 46.2 sec  POCT INR  Result Value Ref Range   INR 3.9    Anticoagulation Dose Instructions as of 04/09/2016      Dorene Grebe Tue Wed Thu Fri Sat   New Dose 5 mg 5 mg 2.5 mg 5 mg 5 mg 5 mg 2.5 mg    Description        Skip tonight's dose and then take 2.5 mg on Tuesdays and Saturdays but 5 mg on all other days          Assessment & Plan:   Problem List Items Addressed This Visit      Cardiovascular and Mediastinum   Paroxysmal atrial fibrillation (HCC)   Relevant Medications   hydrochlorothiazide (MICROZIDE) 12.5 MG capsule   furosemide (LASIX) 20 MG tablet   Acute on chronic combined systolic and diastolic CHF (congestive heart failure) ([place]) - Primary    Mostly resolved, started having swelling just today. Denies any shortness of breath or chest pain or palpitations or wheezing. Will increase Lasix to 2 pills a day on the days that he swollen.      Relevant Medications   hydrochlorothiazide (MICROZIDE) 12.5 MG capsule   furosemide (LASIX) 20 MG tablet   Other Relevant Orders   CMP14+EGFR (Completed)     Genitourinary   Acute kidney injury superimposed on CKD ([place])    Will recheck kidney function, likely was elevated during the hospital stay once they started him on IV diuretics. Recheck today  and see where he is at.      Relevant Orders   CMP14+EGFR (Completed)     Other   Long term current use of anticoagulant therapy   Relevant Orders   CoaguChek XS/INR Waived (Completed)       Follow up plan: Return in about 1 week (around 04/16/2016), or if symptoms worsen or fail to improve, for Come back in one week for CHF recheck, also come back in one week for a visit with Tammy for Coumadi.  Counseling provided for all of the vaccine components Orders Placed This Encounter  Procedures  . CMP14+EGFR  . CoaguChek XS/INR Waived  . POCT INR    Caryl Pina, MD [facility] 04/11/2016, 11:39 AM

## 2016-04-10 ENCOUNTER — Encounter: Payer: Self-pay | Admitting: Cardiology

## 2016-04-10 ENCOUNTER — Ambulatory Visit (INDEPENDENT_AMBULATORY_CARE_PROVIDER_SITE_OTHER): Payer: Medicare Other | Admitting: Cardiology

## 2016-04-10 VITALS — BP 131/58 | HR 54 | Ht 66.0 in | Wt 170.0 lb

## 2016-04-10 DIAGNOSIS — I480 Paroxysmal atrial fibrillation: Secondary | ICD-10-CM | POA: Diagnosis not present

## 2016-04-10 DIAGNOSIS — I6523 Occlusion and stenosis of bilateral carotid arteries: Secondary | ICD-10-CM | POA: Diagnosis not present

## 2016-04-10 DIAGNOSIS — M7989 Other specified soft tissue disorders: Secondary | ICD-10-CM | POA: Diagnosis not present

## 2016-04-10 NOTE — Patient Instructions (Signed)
Medication Instructions:  The current medical regimen is effective;  continue present plan and medications.  Testing/Procedures: Your physician has requested that you have a carotid duplex at [facility]. Please arrive at registration at 8:45 AM. This test is an ultrasound of the carotid arteries in your neck. It looks at blood flow through these arteries that supply the brain with blood. Allow one hour for this exam. There are no restrictions or special instructions.  If you need to reschedule please call [phone].  Follow-Up: Follow up in 6 months with Dr. Percival Spanish in [place].  You will receive a letter in the mail 2 months before you are due.  Please call us when you receive this letter to schedule your follow up appointment.  If you need a refill on your cardiac medications before your next appointment, please call your pharmacy.  Thank you for choosing [facility]!!

## 2016-04-11 ENCOUNTER — Ambulatory Visit (HOSPITAL_COMMUNITY)
Admission: RE | Admit: 2016-04-11 | Discharge: 2016-04-11 | Disposition: A | Discharge status: Home / Self Care | Payer: Medicare Other | Source: Ambulatory Visit | Attending: Cardiology | Admitting: Cardiology

## 2016-04-11 ENCOUNTER — Other Ambulatory Visit: Payer: Self-pay | Admitting: Family Medicine

## 2016-04-11 ENCOUNTER — Telehealth: Payer: Self-pay | Admitting: Cardiology

## 2016-04-11 DIAGNOSIS — I6523 Occlusion and stenosis of bilateral carotid arteries: Secondary | ICD-10-CM | POA: Insufficient documentation

## 2016-04-11 DIAGNOSIS — I7090 Unspecified atherosclerosis: Secondary | ICD-10-CM | POA: Diagnosis not present

## 2016-04-11 NOTE — Telephone Encounter (Signed)
Carotid doppler was performed today

## 2016-04-11 NOTE — Telephone Encounter (Signed)
New message      Call x-ray report.

## 2016-04-11 NOTE — Telephone Encounter (Signed)
What study are you talking about?

## 2016-04-11 NOTE — Telephone Encounter (Signed)
SPOKE TO TECHNICIAN SHE STATES THE RADIOLOGIST WANTED DR Iliff TO BE AWARE OF THE IMPRESSION NUMBER #1  FOR PATIENT FOLLOW UP DEFERRED TO DR Oldham- COPY OF REPORT GIVEN TO  MEDICAL ASSISTANT TO SHOW TO DR [facility]

## 2016-04-12 ENCOUNTER — Telehealth: Payer: Self-pay | Admitting: Family Medicine

## 2016-04-12 ENCOUNTER — Other Ambulatory Visit: Payer: Self-pay | Admitting: *Deleted

## 2016-04-12 DIAGNOSIS — I6523 Occlusion and stenosis of bilateral carotid arteries: Secondary | ICD-10-CM

## 2016-04-12 NOTE — Telephone Encounter (Signed)
Spoke with pt and advised we took him off the meds due to his kidney function. Pt voiced understanding.

## 2016-04-15 ENCOUNTER — Ambulatory Visit (INDEPENDENT_AMBULATORY_CARE_PROVIDER_SITE_OTHER): Payer: Medicare Other | Admitting: Family

## 2016-04-15 ENCOUNTER — Encounter: Payer: Self-pay | Admitting: Family

## 2016-04-15 ENCOUNTER — Encounter: Payer: Medicare Other | Admitting: Pharmacist

## 2016-04-15 VITALS — BP 128/72 | HR 68 | Temp 97.0°F | Ht 66.0 in | Wt 168.0 lb

## 2016-04-15 DIAGNOSIS — M1712 Unilateral primary osteoarthritis, left knee: Secondary | ICD-10-CM

## 2016-04-15 DIAGNOSIS — I480 Paroxysmal atrial fibrillation: Secondary | ICD-10-CM

## 2016-04-15 DIAGNOSIS — M25562 Pain in left knee: Secondary | ICD-10-CM | POA: Diagnosis not present

## 2016-04-15 DIAGNOSIS — Z7901 Long term (current) use of anticoagulants: Secondary | ICD-10-CM | POA: Diagnosis not present

## 2016-04-15 LAB — COAGUCHEK XS/INR WAIVED
INR: 4 — ABNORMAL HIGH (ref 0.9–1.1)
Prothrombin Time: 47.8 s

## 2016-04-15 MED ORDER — METHYLPREDNISOLONE ACETATE 40 MG/ML IJ SUSP
40.0000 mg | Freq: Once | INTRAMUSCULAR | Status: AC
  Administered 2016-04-15: 40 mg via INTRA_ARTICULAR

## 2016-04-15 MED ORDER — BUPIVACAINE HCL 0.25 % IJ SOLN
1.0000 mL | Freq: Once | INTRAMUSCULAR | Status: AC
  Administered 2016-04-15: 1 mL via INTRA_ARTICULAR

## 2016-04-15 NOTE — Addendum Note (Signed)
Addended by: Liliane Bade on: 04/15/2016 10:39 AM   Modules accepted: Orders

## 2016-04-15 NOTE — Patient Instructions (Addendum)
Anticoagulation Dose Instructions as of 04/15/2016      Dorene Grebe Tue Wed Thu Fri Sat   New Dose 5 mg 7.5 mg 5 mg 5 mg 5 mg 5 mg 5 mg    Description        Skip tomorrows dose and then take 7.5 mg on Monday and Friday but 5 mg on all other days       Arthritis Arthritis is a term that is commonly used to refer to joint pain or joint disease. There are more than 100 types of arthritis. CAUSES The most common cause of this condition is wear and tear of a joint. Other causes include:  Gout.  Inflammation of a joint.  An infection of a joint.  Sprains and other injuries near the joint.  A drug reaction or allergic reaction. In some cases, the cause may not be known. SYMPTOMS The main symptom of this condition is pain in the joint with movement. Other symptoms include:  Redness, swelling, or stiffness at a joint.  Warmth coming from the joint.  Fever.  Overall feeling of illness. DIAGNOSIS This condition may be diagnosed with a physical exam and tests, including:  Blood tests.  Urine tests.  Imaging tests, such as MRI, X-rays, or a CT scan. Sometimes, fluid is removed from a joint for testing. TREATMENT Treatment for this condition may involve:  Treatment of the cause, if it is known.  Rest.  Raising (elevating) the joint.  Applying cold or hot packs to the joint.  Medicines to improve symptoms and reduce inflammation.  Injections of a steroid such as cortisone into the joint to help reduce pain and inflammation. Depending on the cause of your arthritis, you may need to make lifestyle changes to reduce stress on your joint. These changes may include exercising more and losing weight. HOME CARE INSTRUCTIONS Medicines  Take over-the-counter and prescription medicines only as told by your health care provider.  Do not take aspirin to relieve pain if gout is suspected. Activities  Rest your joint if told by your health care provider. Rest is important when your  disease is active and your joint feels painful, swollen, or stiff.  Avoid activities that make the pain worse. It is important to balance activity with rest.  Exercise your joint regularly with range-of-motion exercises as told by your health care provider. Try doing low-impact exercise, such as:  Swimming.  Water aerobics.  Biking.  Walking. Joint Care  If your joint is swollen, keep it elevated if told by your health care provider.  If your joint feels stiff in the morning, try taking a warm shower.  If directed, apply heat to the joint. If you have diabetes, do not apply heat without permission from your health care provider.  Put a towel between the joint and the hot pack or heating pad.  Leave the heat on the area for 20-30 minutes.  If directed, apply ice to the joint:  Put ice in a plastic bag.  Place a towel between your skin and the bag.  Leave the ice on for 20 minutes, 2-3 times per day.  Keep all follow-up visits as told by your health care provider. This is important. SEEK MEDICAL CARE IF:  The pain gets worse.  You have a fever. SEEK IMMEDIATE MEDICAL CARE IF:  You develop severe joint pain, swelling, or redness.  Many joints become painful and swollen.  You develop severe back pain.  You develop severe weakness in your leg.  You cannot control your bladder or bowels.   This information is not intended to replace advice given to you by your health care provider. Make sure you discuss any questions you have with your health care provider.   Document Released: 11/07/2004 Document Revised: 06/21/2015 Document Reviewed: 12/26/2014 Elsevier Interactive Patient Education [employer].

## 2016-04-15 NOTE — Addendum Note (Signed)
Addended by: Evelina Dun A on: 04/15/2016 11:11 AM   Modules accepted: Level of Service, SmartSet

## 2016-04-15 NOTE — Addendum Note (Signed)
Addended by: Evelina Dun A on: 04/15/2016 10:59 AM   Modules accepted: SmartSet

## 2016-04-15 NOTE — Progress Notes (Addendum)
   Subjective:    Patient ID: [name], male    DOB: Jul 10, 1926, 82 y.o.   MRN: [id number]  Knee Pain  The incident occurred 3 to 5 days ago. There was no injury mechanism. The pain is present in the left knee. The quality of the pain is described as aching. The pain is at a severity of 9/10. The pain is moderate. The pain has been constant since onset. Pertinent negatives include no muscle weakness, numbness or tingling. He reports no foreign bodies present. The symptoms are aggravated by weight bearing and movement. He has tried rest for the symptoms. The treatment provided mild relief.   PT would also like to get his INR checked today for A Fib. PT states his A Fib is stable at this time. Please see anticogation flow sheet.    Review of Systems  Respiratory: Negative.   Cardiovascular: Negative.   Genitourinary: Negative.   Musculoskeletal: Positive for joint swelling and arthralgias.  Neurological: Negative for tingling and numbness.       Objective:   Physical Exam  Constitutional: He is oriented to person, place, and time. He appears well-developed and well-nourished. No distress.  Cardiovascular: Normal rate, regular rhythm, normal heart sounds and intact distal pulses.   No murmur heard. Pulmonary/Chest: Effort normal and breath sounds normal. No respiratory distress. He has no wheezes.  Abdominal: Soft. Bowel sounds are normal. He exhibits no distension. There is no tenderness.  Musculoskeletal: He exhibits edema (trace amt in left knee). He exhibits no tenderness.  FULL ROM of left knee, pain with flexion   Neurological: He is alert and oriented to person, place, and time.  Skin: Skin is warm and dry. No rash noted. No erythema.  Psychiatric: He has a normal mood and affect. His behavior is normal. Judgment and thought content normal.  Vitals reviewed.  BP 128/72 mmHg  Pulse 68  Temp(Src) 97 F (36.1 C) (Oral)  Ht 5\' 6"  (1.676 m)  Wt 168 lb (76.204 kg)  BMI 27.13  kg/m2   leftknee prepped with betadine Injected with Marcaine .5% plain and methylprednisolone with 25 guage needle. Patient tolerated well.      Assessment & Plan:  1. Left knee pain - methylPREDNISolone acetate (DEPO-MEDROL) injection 40 mg; Inject 1 mL (40 mg total) into the articular space once. - bupivacaine (MARCAINE) 0.25 % (with pres) injection 1 mL; Inject 1 mL into the articular space once.  2. Primary osteoarthritis of left knee - methylPREDNISolone acetate (DEPO-MEDROL) injection 40 mg; Inject 1 mL (40 mg total) into the articular space once. - bupivacaine (MARCAINE) 0.25 % (with pres) injection 1 mL; Inject 1 mL into the articular space once.7   3. Paroxysmal atrial fibrillation (HCC) -Stable - CoaguChek XS/INR Waived  4. Long term current use of anticoagulant therapy  Anticoagulation Dose Instructions as of 04/15/2016      Dorene Grebe Tue Wed Thu Fri Sat   New Dose 5 mg 7.5 mg 5 mg 5 mg 5 mg 5 mg 5 mg    Description        Skip tomorrows dose and then take 7.5 mg on Monday and Friday but 5 mg on all other days        Rest Ice Continue medications Keep elevated when possible RTO Prn  Evelina Dun, FNP

## 2016-04-15 NOTE — Addendum Note (Signed)
Addended by: Evelina Dun A on: 04/15/2016 10:57 AM   Modules accepted: SmartSet

## 2016-04-18 ENCOUNTER — Encounter: Payer: Self-pay | Admitting: Family Medicine

## 2016-04-18 ENCOUNTER — Ambulatory Visit (INDEPENDENT_AMBULATORY_CARE_PROVIDER_SITE_OTHER): Payer: Medicare Other | Admitting: Family Medicine

## 2016-04-18 VITALS — BP 138/58 | HR 50 | Temp 97.7°F | Ht 66.0 in | Wt 169.6 lb

## 2016-04-18 DIAGNOSIS — M25562 Pain in left knee: Secondary | ICD-10-CM | POA: Diagnosis not present

## 2016-04-18 DIAGNOSIS — I480 Paroxysmal atrial fibrillation: Secondary | ICD-10-CM | POA: Diagnosis not present

## 2016-04-18 DIAGNOSIS — I5043 Acute on chronic combined systolic (congestive) and diastolic (congestive) heart failure: Secondary | ICD-10-CM

## 2016-04-18 LAB — COAGUCHEK XS/INR WAIVED
INR: 2.9 — ABNORMAL HIGH (ref 0.9–1.1)
PROTHROMBIN TIME: 34.9 s

## 2016-04-18 NOTE — Patient Instructions (Addendum)
Great to see you!  For your knee Use compression (the knee sleeve is good), every time you will be on it for a while.  Use ice 15 minutes 3 times a day for 4-5 days Use tylenol or tramadol as needed for pain.   Please let us know if your pain is not improving as expected  Coumadin dosing Take 1 pill daily Come back to see Tammy in 2 weeks for re-check INR

## 2016-04-18 NOTE — Progress Notes (Signed)
   HPI  Patient presents today here to follow-up for left knee pain, INR check, and CHF.  CHF Good diuresis, states that his swelling and breathing has improved since a few weeks ago.  Knee pain not improved, had injection 3 days ago. No injury, describes medial knee pain. States that compression and ice have not really helped much, however he's only use them once. Using Tylenol and tramadol sparingly.  Coumadin Taking 1 pill once a, states he supposed to take one half pill on Monday. No signs of bleeding  PMH: Smoking status noted ROS: Per HPI  Objective: BP 138/58 mmHg  Pulse 50  Temp(Src) 97.7 F (36.5 C) (Oral)  Ht 5\' 6"  (1.676 m)  Wt 169 lb 9.6 oz (76.93 kg)  BMI 27.39 kg/m2 Gen: NAD, alert, cooperative with exam HEENT: NCAT, CV: RRR, good S1/S2, no murmur Resp: CTABL, no wheezes, non-labored Ext: No edema, warm Neuro: Alert and oriented, No gross deficits  MSK: L knee without erythema, bruising, or gross deformity Mild effusion of the left knee, positive medial joint line tenderness, tenderness on the medial joint with valgus stress ligamentously intact to Lachman's and with varus and valgus stress.  Negative McMurray's test   Assessment and plan:  # Knee pain No joint laxity, knee exam is concerning for medial meniscus injury versus sprain of the medial collateral ligament Discussed ice, compression, conservative therapy including Tylenol and tramadol. Return to clinic if not improving as expected Offered orthopedic referral, however patient would not like this this time.  # Chronic anticoagulation, atrial fibrillation Rate controlled INR is therapeutic today Given recent elevation to 4.0 and now down to 2.9 and 3 days, as well as given steroid injection, I recommended taking only 5 mg daily and follow-up INR in 2 weeks.  CHF Hospitalized in June for acute CHF exacerbation, appears euvolemic today No changes in diuretic currently  Laroy Apple,  MD [facility] 04/18/2016, 5:14 PM

## 2016-04-19 ENCOUNTER — Telehealth: Payer: Self-pay

## 2016-04-19 DIAGNOSIS — H2513 Age-related nuclear cataract, bilateral: Secondary | ICD-10-CM | POA: Diagnosis not present

## 2016-04-19 DIAGNOSIS — T1512XA Foreign body in conjunctival sac, left eye, initial encounter: Secondary | ICD-10-CM | POA: Diagnosis not present

## 2016-04-19 NOTE — Telephone Encounter (Signed)
Patient aware to come in Monday to have lab work.

## 2016-04-19 NOTE — Telephone Encounter (Signed)
Tried to reach patient so he could some in for lab work. No answer, left vm asking him to come in today or tomorrow to get lab work.

## 2016-04-22 ENCOUNTER — Ambulatory Visit (INDEPENDENT_AMBULATORY_CARE_PROVIDER_SITE_OTHER): Payer: Medicare Other | Admitting: Family

## 2016-04-22 ENCOUNTER — Other Ambulatory Visit: Payer: Self-pay | Admitting: Family Medicine

## 2016-04-22 ENCOUNTER — Encounter: Payer: Self-pay | Admitting: Family

## 2016-04-22 VITALS — BP 136/92 | Temp 97.1°F | Ht 66.0 in | Wt 168.4 lb

## 2016-04-22 DIAGNOSIS — M25462 Effusion, left knee: Secondary | ICD-10-CM

## 2016-04-22 DIAGNOSIS — I480 Paroxysmal atrial fibrillation: Secondary | ICD-10-CM

## 2016-04-22 DIAGNOSIS — M25562 Pain in left knee: Secondary | ICD-10-CM | POA: Diagnosis not present

## 2016-04-22 LAB — COAGUCHEK XS/INR WAIVED
INR: 2.9 — AB (ref 0.9–1.1)
PROTHROMBIN TIME: 35.1 s

## 2016-04-22 NOTE — Progress Notes (Signed)
   Subjective:    Patient ID: [name], male    DOB: 07-19-1926, 82 y.o.   MRN: [id number]  Pt presents to the office today for recurrent left knee pain. PT was seen in the office on 04/15/16 and was given steroid injection. PT states he continues to have constant pain of 7-8 out 10. PT states walking makes the pain worse. Pt has tried ice, compression with no relief.   PT is requesting to have his INR checked today for A fib. PT states his A Fib is stable.  Knee Pain  The incident occurred more than 1 week ago.      Review of Systems  Constitutional: Negative.   HENT: Negative.   Respiratory: Negative.   Cardiovascular: Negative.   Gastrointestinal: Negative.   Endocrine: Negative.   Genitourinary: Negative.   Musculoskeletal: Positive for joint swelling and arthralgias.  Neurological: Negative.   Hematological: Negative.   Psychiatric/Behavioral: Negative.   All other systems reviewed and are negative.      Objective:   Physical Exam  Constitutional: He is oriented to person, place, and time. He appears well-developed and well-nourished. No distress.  HENT:  Head: Normocephalic.  Eyes: Pupils are equal, round, and reactive to light. Right eye exhibits no discharge. Left eye exhibits no discharge.  Neck: Normal range of motion. Neck supple. No thyromegaly present.  Cardiovascular: Normal rate, regular rhythm and intact distal pulses.   Murmur heard. Pulmonary/Chest: Effort normal and breath sounds normal. No respiratory distress. He has no wheezes.  Abdominal: Soft. Bowel sounds are normal. He exhibits no distension. There is no tenderness.  Musculoskeletal: Normal range of motion. He exhibits edema (left knee 2+ swelling) and tenderness.  Pain with flexion and extension   Neurological: He is alert and oriented to person, place, and time.  Skin: Skin is warm and dry. No rash noted. No erythema.  Psychiatric: He has a normal mood and affect. His behavior is normal.  Judgment and thought content normal.  Vitals reviewed.    Temp(Src) 97.1 F (36.2 C) (Oral)  Ht 5\' 6"  (1.676 m)  Wt 168 lb 6.4 oz (76.386 kg)  BMI 27.19 kg/m2     Assessment & Plan:  1. Pain in left knee -Rest -Ice -Compression  - Ambulatory referral to Orthopedic Surgery  2. Paroxysmal atrial fibrillation (HCC) -Stable - CoaguChek XS/INR Waived Anticoagulation Dose Instructions as of 04/22/2016      Dorene Grebe Tue Wed Thu Fri Sat   New Dose 5 mg 5 mg 5 mg 5 mg 5 mg 5 mg 5 mg    Description        Continue warfarin 5mg  daily - take 1 tablet daily       3. Left knee pain - Ambulatory referral to Orthopedic Surgery  4. Knee effusion, left - Ambulatory referral to [place], FNP

## 2016-04-22 NOTE — Patient Instructions (Addendum)
Knee Pain Knee pain is a very common symptom and can have many causes. Knee pain often goes away when you follow your health care provider's instructions for relieving pain and discomfort at home. However, knee pain can develop into a condition that needs treatment. Some conditions may include:  Arthritis caused by wear and tear (osteoarthritis).  Arthritis caused by swelling and irritation (rheumatoid arthritis or gout).  A cyst or growth in your knee.  An infection in your knee joint.  An injury that will not heal.  Damage, swelling, or irritation of the tissues that support your knee (torn ligaments or tendinitis). If your knee pain continues, additional tests may be ordered to diagnose your condition. Tests may include X-rays or other imaging studies of your knee. You may also need to have fluid removed from your knee. Treatment for ongoing knee pain depends on the cause, but treatment may include:  Medicines to relieve pain or swelling.  Steroid injections in your knee.  Physical therapy.  Surgery. HOME CARE INSTRUCTIONS  Take medicines only as directed by your health care provider.  Rest your knee and keep it raised (elevated) while you are resting.  Do not do things that cause or worsen pain.  Avoid high-impact activities or exercises, such as running, jumping rope, or doing jumping jacks.  Apply ice to the knee area:  Put ice in a plastic bag.  Place a towel between your skin and the bag.  Leave the ice on for 20 minutes, 2-3 times a day.  Ask your health care provider if you should wear an elastic knee support.  Keep a pillow under your knee when you sleep.  Lose weight if you are overweight. Extra weight can put pressure on your knee.  Do not use any tobacco products, including cigarettes, chewing tobacco, or electronic cigarettes. If you need help quitting, ask your health care provider. Smoking may slow the healing of any bone and joint problems that you may  have. SEEK MEDICAL CARE IF:  Your knee pain continues, changes, or gets worse.  You have a fever along with knee pain.  Your knee buckles or locks up.  Your knee becomes more swollen. SEEK IMMEDIATE MEDICAL CARE IF:   Your knee joint feels hot to the touch.  You have chest pain or trouble breathing.   This information is not intended to replace advice given to you by your health care provider. Make sure you discuss any questions you have with your health care provider.   Document Released: 07/28/2007 Document Revised: 10/21/2014 Document Reviewed: 05/16/2014 Elsevier Interactive Patient Education 2016 [employer].  Anticoagulation Dose Instructions as of 04/22/2016      [name] Tue Wed Thu Fri Sat   New Dose 5 mg 5 mg 5 mg 5 mg 5 mg 5 mg 5 mg    Description        Continue warfarin 5mg  daily - take 1 tablet daily

## 2016-04-29 ENCOUNTER — Other Ambulatory Visit: Payer: Self-pay | Admitting: Family Medicine

## 2016-04-30 ENCOUNTER — Other Ambulatory Visit: Payer: Self-pay | Admitting: Pharmacist

## 2016-04-30 ENCOUNTER — Ambulatory Visit (INDEPENDENT_AMBULATORY_CARE_PROVIDER_SITE_OTHER): Payer: Medicare Other | Admitting: Family Medicine

## 2016-04-30 ENCOUNTER — Encounter: Payer: Self-pay | Admitting: Family Medicine

## 2016-04-30 VITALS — BP 136/64 | HR 76 | Temp 97.6°F | Ht 66.0 in | Wt 163.2 lb

## 2016-04-30 DIAGNOSIS — N19 Unspecified kidney failure: Secondary | ICD-10-CM

## 2016-04-30 DIAGNOSIS — M25562 Pain in left knee: Secondary | ICD-10-CM | POA: Diagnosis not present

## 2016-04-30 DIAGNOSIS — N189 Chronic kidney disease, unspecified: Secondary | ICD-10-CM | POA: Diagnosis not present

## 2016-04-30 DIAGNOSIS — M25462 Effusion, left knee: Secondary | ICD-10-CM | POA: Diagnosis not present

## 2016-04-30 DIAGNOSIS — Z9181 History of falling: Secondary | ICD-10-CM

## 2016-04-30 DIAGNOSIS — I1310 Hypertensive heart and chronic kidney disease without heart failure, with stage 1 through stage 4 chronic kidney disease, or unspecified chronic kidney disease: Secondary | ICD-10-CM | POA: Diagnosis not present

## 2016-04-30 DIAGNOSIS — R296 Repeated falls: Secondary | ICD-10-CM | POA: Diagnosis not present

## 2016-04-30 DIAGNOSIS — Z7901 Long term (current) use of anticoagulants: Secondary | ICD-10-CM

## 2016-04-30 DIAGNOSIS — I480 Paroxysmal atrial fibrillation: Secondary | ICD-10-CM | POA: Diagnosis not present

## 2016-04-30 DIAGNOSIS — I5043 Acute on chronic combined systolic (congestive) and diastolic (congestive) heart failure: Secondary | ICD-10-CM

## 2016-04-30 LAB — CMP14+EGFR
A/G RATIO: 1.3 (ref 1.2–2.2)
ALT: 6 IU/L (ref 0–44)
AST: 9 IU/L (ref 0–40)
Albumin: 4 g/dL (ref 3.5–4.7)
Alkaline Phosphatase: 59 IU/L (ref 39–117)
BUN/Creatinine Ratio: 24 (ref 10–24)
BUN: 35 mg/dL — ABNORMAL HIGH (ref 8–27)
Bilirubin Total: 0.4 mg/dL (ref 0.0–1.2)
CALCIUM: 9.3 mg/dL (ref 8.6–10.2)
CO2: 24 mmol/L (ref 18–29)
Chloride: 96 mmol/L (ref 96–106)
Creatinine, Ser: 1.43 mg/dL — ABNORMAL HIGH (ref 0.76–1.27)
GFR, EST AFRICAN AMERICAN: 50 mL/min/{1.73_m2} — AB (ref >59)
GFR, EST NON AFRICAN AMERICAN: 43 mL/min/{1.73_m2} — AB (ref >59)
GLOBULIN, TOTAL: 3.2 g/dL (ref 1.5–4.5)
Glucose: 121 mg/dL — ABNORMAL HIGH (ref 65–99)
POTASSIUM: 4.4 mmol/L (ref 3.5–5.2)
Sodium: 138 mmol/L (ref 134–144)
TOTAL PROTEIN: 7.2 g/dL (ref 6.0–8.5)

## 2016-04-30 LAB — CBC WITH DIFFERENTIAL/PLATELET
BASOS: 0 %
Basophils Absolute: 0 10*3/uL (ref 0.0–0.2)
EOS (ABSOLUTE): 0.1 10*3/uL (ref 0.0–0.4)
EOS: 1 %
HEMATOCRIT: 38.5 % (ref 37.5–51.0)
Hemoglobin: 12.1 g/dL — ABNORMAL LOW (ref 12.6–17.7)
IMMATURE GRANS (ABS): 0 10*3/uL (ref 0.0–0.1)
IMMATURE GRANULOCYTES: 0 %
LYMPHS: 19 %
Lymphocytes Absolute: 1.3 10*3/uL (ref 0.7–3.1)
MCH: 23.8 pg — ABNORMAL LOW (ref 26.6–33.0)
MCHC: 31.4 g/dL — ABNORMAL LOW (ref 31.5–35.7)
MCV: 76 fL — AB (ref 79–97)
Monocytes Absolute: 0.4 10*3/uL (ref 0.1–0.9)
Monocytes: 6 %
NEUTROS PCT: 74 %
Neutrophils Absolute: 4.8 10*3/uL (ref 1.4–7.0)
PLATELETS: 450 10*3/uL — AB (ref 150–379)
RBC: 5.09 x10E6/uL (ref 4.14–5.80)
RDW: 14.9 % (ref 12.3–15.4)
WBC: 6.6 10*3/uL (ref 3.4–10.8)

## 2016-04-30 LAB — COAGUCHEK XS/INR WAIVED
INR: 3.1 — AB (ref 0.9–1.1)
PROTHROMBIN TIME: 36.7 s

## 2016-04-30 MED ORDER — TRAMADOL HCL 50 MG PO TABS: 50.0000 mg | ORAL_TABLET | Freq: Four times a day (QID) | ORAL | Status: DC | PRN

## 2016-04-30 MED ORDER — FUROSEMIDE 20 MG PO TABS: 20.0000 mg | ORAL_TABLET | ORAL | Status: DC

## 2016-04-30 NOTE — Progress Notes (Signed)
Subjective:  Patient ID: [name], male    DOB: 02/27/1926  Age: 82 y.o. MRN: [id number]  CC: left knee pain   HPI Eulises Kijowski presents for Continued pain and swelling at the left knee. The right is bothering him but not nearly as bad as the left. He feels that the most recent injection into the left knee was not helpful. The one prior to that didn't last very long. He has very little transportation available. Therefore he needs to see orthopedics in [place] when available but scheduling tells me they are not coming in July and it will be mid August before they're available for him. Patient is able to walk as long as he has a cane. Pain is moderately severe. He is unable to give me a number between 1 and 10.  Follow-up atrial fibrillation Patient in for follow-up of atrial fibrillation. Patient denies any recent bouts of chest pain or palpitations. Additionally, patient is taking anticoagulants. Patient denies any recent excessive bleeding episodes including epistaxis, bleeding from the gums, genitalia, rectal bleeding or hematuria. Additionally there has been no excessive bruising.  Recently taken off lisinopril and indomethacin and hydrochlorothiazide with Lasix cut to 20 mg twice a day. This was due to an acute elevation of his creatinine noted last week. Patient denies generalized or ankle edema.   History Sadarius has a past medical history of Dilated cardiomyopathy ([place]); Atrial fibrillation ([place]); Drug therapy; CAD (coronary artery disease); Hypertension; Dyslipidemia; COPD (chronic obstructive pulmonary disease) ([place]); and Hypothyroidism.   He has past surgical history that includes Hip surgery (Left); Cardiac catheterization (05/2007); and Coronary stent placement.   His family history includes Diabetes in his other; Heart attack in his father and mother; Stroke in his other. He was adopted.He reports that he has never smoked. He has never used smokeless tobacco. He reports that he  does not drink alcohol or use illicit drugs.    ROS Review of Systems  Constitutional: Negative for fever, chills and diaphoresis.  HENT: Negative for rhinorrhea and sore throat.   Respiratory: Negative for cough and shortness of breath.   Cardiovascular: Negative for chest pain.  Gastrointestinal: Negative for abdominal pain.  Musculoskeletal: Negative for myalgias and arthralgias.  Skin: Negative for rash.  Neurological: Negative for weakness and headaches.  Hematological: Does not bruise/bleed easily.    Objective:  BP 136/64 mmHg  Pulse 76  Temp(Src) 97.6 F (36.4 C) (Oral)  Ht '5\' 6"'$  (1.676 m)  Wt 163 lb 4 oz (74.05 kg)  BMI 26.36 kg/m2  BP Readings from Last 3 Encounters:  04/30/16 136/64  04/22/16 136/92  04/18/16 138/58    Wt Readings from Last 3 Encounters:  04/30/16 163 lb 4 oz (74.05 kg)  04/22/16 168 lb 6.4 oz (76.386 kg)  04/18/16 169 lb 9.6 oz (76.93 kg)     Physical Exam  Constitutional: He is oriented to person, place, and time. He appears well-developed and well-nourished.  HENT:  Head: Normocephalic and atraumatic.  Right Ear: Tympanic membrane and external ear normal. No decreased hearing is noted.  Left Ear: Tympanic membrane and external ear normal. No decreased hearing is noted.  Mouth/Throat: No oropharyngeal exudate or posterior oropharyngeal erythema.  Eyes: Pupils are equal, round, and reactive to light.  Neck: Normal range of motion. Neck supple.  Cardiovascular: Normal rate and regular rhythm.   No murmur heard. Pulmonary/Chest: Breath sounds normal. No respiratory distress.  Abdominal: Soft. Bowel sounds are normal. He exhibits no mass. There is no  tenderness.  Musculoskeletal: He exhibits edema (2+ effusion at the left knee) and tenderness (oderately tender for palpation and range of motion at the left knee).  Neurological: He is alert and oriented to person, place, and time.  Vitals reviewed.    Lab Results  Component Value Date    WBC 4.6 04/02/2016   HGB 12.4* 04/02/2016   HCT 38.6* 04/02/2016   PLT 384 04/02/2016   GLUCOSE 137* 04/09/2016   CHOL 188 12/26/2015   TRIG 360* 12/26/2015   HDL 25* 12/26/2015   LDLCALC 91 12/26/2015   ALT 7 04/09/2016   AST 12 04/09/2016   NA 135 04/09/2016   K 5.2 04/09/2016   CL 94* 04/09/2016   CREATININE 1.99* 04/09/2016   BUN 61* 04/09/2016   CO2 23 04/09/2016   TSH 0.596 03/31/2016   INR 3.1* 04/30/2016   HGBA1C 6.1 07/14/2015    US Carotid Bilateral  04/11/2016  CLINICAL DATA:  Follow-up carotid artery stenosis. History of hypertension, hyperlipidemia and CAD. EXAM: BILATERAL CAROTID DUPLEX ULTRASOUND TECHNIQUE: Wallace Cullens scale imaging, color Doppler and duplex ultrasound were performed of bilateral carotid and vertebral arteries in the neck. COMPARISON:  Carotid Doppler ultrasound - 04/20/2015 FINDINGS: Criteria: Quantification of carotid stenosis is based on velocity parameters that correlate the residual internal carotid diameter with NASCET-based stenosis levels, using the diameter of the distal internal carotid lumen as the denominator for stenosis measurement. The following velocity measurements were obtained: RIGHT ICA:  117/36 cm/sec CCA:  62/12 cm/sec SYSTOLIC ICA/CCA RATIO:  1.9 DIASTOLIC ICA/CCA RATIO:  2.9 ECA:  245 cm/sec LEFT ICA:  171/52 cm/sec CCA:  80/17 cm/sec SYSTOLIC ICA/CCA RATIO:  2.1 DIASTOLIC ICA/CCA RATIO:  3.1 ECA:  201 cm/sec RIGHT CAROTID ARTERY: There is a minimal amount of eccentric mixed echogenic plaque with the mid aspect the right common carotid artery (image 7). There is a moderate to large amount of eccentric mixed echogenic densely shadowing plaque within the right carotid bulb (image 16), extending to involve the origin and proximal aspect the right internal carotid artery (image 24), morphologically similar to the 04/2015 examination though again not resulting in elevated peak systolic velocities within the interrogated course the right internal  carotid artery to suggest a hemodynamically significant stenosis. RIGHT VERTEBRAL ARTERY:  Antegrade Flow LEFT CAROTID ARTERY: There is a moderate to large amount of eccentric mixed echogenic she partially shadowing plaque within the left carotid bulb (image 50), extending to involve the origin and proximal aspect the left internal carotid artery (58, which results in elevated peak systolic velocities within knee proximal and mid aspects of the left internal carotid artery (greatest acquired peak systolic velocity with the proximal left ICA measures 171 cm/sec -image 60), previously, greatest acquired peak systolic velocity within the mid ICA measured 140 cm/sec), morphologically similar to the 04/2015 examination. LEFT VERTEBRAL ARTERY:  Antegrade Flow Abnormal waveforms are demonstrated throughout the interrogated portions of both carotid systems with an secondary systolic peak waveform, potentially indicative of Bigeminy with associated PVC after each beat or aortic regurgitation, not seen on prior carotid Doppler ultrasound 04/20/2015 IMPRESSION: 1. Grossly unchanged moderate to large amount of left-sided atherosclerotic plaque resulting in elevated peak systolic velocities within the left internal carotid artery compatible with the 50-69% luminal narrowing range. Further evaluation with CTA could be performed as clinically indicated. 2. Grossly unchanged moderate to large amount of right-sided atherosclerotic plaque, not resulting in hemodynamically significant stenosis. 3. Interval development of abnormal waveforms throughout the interrogated portions of both carotid systems  potentially indicative of bigeminy or aortic regurgitation. Further evaluation ECG and cardiac echo is recommended. 4. Atherosclerosis (ICD10-170.0) These results will be called to the ordering clinician or representative by the Radiologist Assistant, and communication documented in the PACS or zVision Dashboard. Electronically Signed    By: Simonne Come M.D.   On: 04/11/2016 10:24    Assessment & Plan:   Darry was seen today for left knee pain.  Diagnoses and all orders for this visit:  Pain in left knee -     MR Knee Left W Contrast; Future  Paroxysmal atrial fibrillation (HCC) -     CoaguChek XS/INR Waived -     CoaguChek XS/INR Waived -     DME Other see comment -     CBC with Differential/Platelet -     CMP14+EGFR -     Brain natriuretic peptide  Knee effusion, left -     MR Knee Left W Contrast; Future  Other orders -     traMADol (ULTRAM) 50 MG tablet; Take 1 tablet (50 mg total) by mouth every 6 (six) hours as needed.      Medication List       This list is accurate as of: 04/30/16 11:41 AM.  Always use your most recent med list.               acetaminophen 500 MG tablet  Commonly known as:  TYLENOL  Take 1-2 tablets (500-1,000 mg total) by mouth 2 (two) times daily as needed.     amLODipine 10 MG tablet  Commonly known as:  NORVASC  Take 10 mg by mouth daily.     CALCIUM PO  Take 600 mg by mouth daily.     carvedilol 12.5 MG tablet  Commonly known as:  COREG  TAKE (1) TABLET TWICE A DAY.     cholecalciferol 1000 units tablet  Commonly known as:  VITAMIN D  Take 1,000 Units by mouth daily.     escitalopram 5 MG tablet  Commonly known as:  LEXAPRO  Take 1 tablet (5 mg total) by mouth daily.     fenofibrate micronized 134 MG capsule  Commonly known as:  LOFIBRA  Take 134 mg by mouth daily before breakfast.     fish oil-omega-3 fatty acids 1000 MG capsule  Take 1,400 mg by mouth daily.     furosemide 20 MG tablet  Commonly known as:  LASIX  Take 2 tablets (40 mg total) by mouth daily.     gabapentin 600 MG tablet  Commonly known as:  NEURONTIN  Take 1 tablet (600 mg total) by mouth 3 (three) times daily.     levothyroxine 150 MCG tablet  Commonly known as:  SYNTHROID, LEVOTHROID  TAKE 1 TABLET DAILY     NITROSTAT 0.4 MG SL tablet  Generic drug:  nitroGLYCERIN    Place 1 tablet (0.4 mg total) under the tongue every 5 (five) minutesas needed for chest pain.     traMADol 50 MG tablet  Commonly known as:  ULTRAM  Take 1 tablet (50 mg total) by mouth every 6 (six) hours as needed.     trimethoprim-polymyxin b ophthalmic solution  Commonly known as:  POLYTRIM     warfarin 5 MG tablet  Commonly known as:  COUMADIN  Take 5 mg once daily except on Monday and Friday take 7.5 mg.        INR 3.1 today Ortho appt pending Hinged knee brace ordered   I have  changed Mr. Hardenbrook traMADol. I am also having him maintain his fish oil-omega-3 fatty acids, CALCIUM PO, NITROSTAT, carvedilol, escitalopram, gabapentin, cholecalciferol, acetaminophen, furosemide, amLODipine, fenofibrate micronized, levothyroxine, warfarin, and trimethoprim-polymyxin b.  Meds ordered this encounter  Medications  . traMADol (ULTRAM) 50 MG tablet    Sig: Take 1 tablet (50 mg total) by mouth every 6 (six) hours as needed.    Dispense:  40 tablet    Refill:  2     Follow-up: Return in about 1 month (around 05/31/2016) for Pain, CHF, A fib.  Claretta Fraise, M.D.

## 2016-04-30 NOTE — Patient Instructions (Signed)
Anticoagulation Dose Instructions as of 04/30/2016      [name] Tue Wed Thu Fri Sat   New Dose 5 mg 5 mg 5 mg 2.5 mg 5 mg 5 mg 5 mg    Description        Decrease warfarin to 1/2 tablet on Wednesdays and 1 tablet all other days.     INR was 3.1 today (just a little too thin)

## 2016-05-01 ENCOUNTER — Emergency Department (HOSPITAL_COMMUNITY)
Admission: EM | Admit: 2016-05-01 | Discharge: 2016-05-01 | Disposition: A | Discharge status: Home / Self Care | Payer: Medicare Other | Attending: Emergency Medicine | Admitting: Emergency Medicine

## 2016-05-01 ENCOUNTER — Telehealth: Payer: Self-pay | Admitting: *Deleted

## 2016-05-01 ENCOUNTER — Emergency Department (HOSPITAL_COMMUNITY): Payer: Medicare Other

## 2016-05-01 ENCOUNTER — Encounter (HOSPITAL_COMMUNITY): Payer: Self-pay | Admitting: Emergency Medicine

## 2016-05-01 DIAGNOSIS — J449 Chronic obstructive pulmonary disease, unspecified: Secondary | ICD-10-CM | POA: Diagnosis not present

## 2016-05-01 DIAGNOSIS — R52 Pain, unspecified: Secondary | ICD-10-CM | POA: Diagnosis present

## 2016-05-01 DIAGNOSIS — E785 Hyperlipidemia, unspecified: Secondary | ICD-10-CM | POA: Insufficient documentation

## 2016-05-01 DIAGNOSIS — I493 Ventricular premature depolarization: Secondary | ICD-10-CM | POA: Diagnosis not present

## 2016-05-01 DIAGNOSIS — E039 Hypothyroidism, unspecified: Secondary | ICD-10-CM | POA: Diagnosis not present

## 2016-05-01 DIAGNOSIS — Z7901 Long term (current) use of anticoagulants: Secondary | ICD-10-CM | POA: Insufficient documentation

## 2016-05-01 DIAGNOSIS — I4891 Unspecified atrial fibrillation: Secondary | ICD-10-CM | POA: Diagnosis not present

## 2016-05-01 DIAGNOSIS — I2510 Atherosclerotic heart disease of native coronary artery without angina pectoris: Secondary | ICD-10-CM | POA: Diagnosis not present

## 2016-05-01 DIAGNOSIS — I10 Essential (primary) hypertension: Secondary | ICD-10-CM | POA: Insufficient documentation

## 2016-05-01 DIAGNOSIS — Z79899 Other long term (current) drug therapy: Secondary | ICD-10-CM | POA: Insufficient documentation

## 2016-05-01 DIAGNOSIS — M25462 Effusion, left knee: Secondary | ICD-10-CM | POA: Diagnosis not present

## 2016-05-01 LAB — CBC WITH DIFFERENTIAL/PLATELET
Basophils Absolute: 0 10*3/uL (ref 0.0–0.1)
Basophils Relative: 0 %
EOS ABS: 0.1 10*3/uL (ref 0.0–0.7)
Eosinophils Relative: 1 %
HEMATOCRIT: 37.2 % — AB (ref 39.0–52.0)
HEMOGLOBIN: 11.7 g/dL — AB (ref 13.0–17.0)
LYMPHS ABS: 1.3 10*3/uL (ref 0.7–4.0)
LYMPHS PCT: 22 %
MCH: 24.4 pg — AB (ref 26.0–34.0)
MCHC: 31.5 g/dL (ref 30.0–36.0)
MCV: 77.7 fL — AB (ref 78.0–100.0)
MONOS PCT: 7 %
Monocytes Absolute: 0.4 10*3/uL (ref 0.1–1.0)
NEUTROS ABS: 4.1 10*3/uL (ref 1.7–7.7)
NEUTROS PCT: 70 %
Platelets: 384 10*3/uL (ref 150–400)
RBC: 4.79 MIL/uL (ref 4.22–5.81)
RDW: 14.7 % (ref 11.5–15.5)
WBC: 5.9 10*3/uL (ref 4.0–10.5)

## 2016-05-01 LAB — COMPREHENSIVE METABOLIC PANEL
ALK PHOS: 50 U/L (ref 38–126)
ALT: 8 U/L — AB (ref 17–63)
ANION GAP: 8 (ref 5–15)
AST: 13 U/L — ABNORMAL LOW (ref 15–41)
Albumin: 3.6 g/dL (ref 3.5–5.0)
BILIRUBIN TOTAL: 0.4 mg/dL (ref 0.3–1.2)
BUN: 35 mg/dL — ABNORMAL HIGH (ref 6–20)
CALCIUM: 9 mg/dL (ref 8.9–10.3)
CO2: 25 mmol/L (ref 22–32)
CREATININE: 1.33 mg/dL — AB (ref 0.61–1.24)
Chloride: 106 mmol/L (ref 101–111)
GFR calc non Af Amer: 46 mL/min — ABNORMAL LOW (ref >60)
GFR, EST AFRICAN AMERICAN: 53 mL/min — AB (ref >60)
Glucose, Bld: 138 mg/dL — ABNORMAL HIGH (ref 65–99)
Potassium: 3.8 mmol/L (ref 3.5–5.1)
SODIUM: 139 mmol/L (ref 135–145)
TOTAL PROTEIN: 7.4 g/dL (ref 6.5–8.1)

## 2016-05-01 LAB — PROTIME-INR
INR: 2.69 — AB (ref 0.00–1.49)
Prothrombin Time: 28.2 seconds — ABNORMAL HIGH (ref 11.6–15.2)

## 2016-05-01 LAB — URINALYSIS, ROUTINE W REFLEX MICROSCOPIC
BILIRUBIN URINE: NEGATIVE
GLUCOSE, UA: NEGATIVE mg/dL
HGB URINE DIPSTICK: NEGATIVE
Ketones, ur: NEGATIVE mg/dL
Leukocytes, UA: NEGATIVE
Nitrite: NEGATIVE
PROTEIN: NEGATIVE mg/dL
SPECIFIC GRAVITY, URINE: 1.01 (ref 1.005–1.030)
pH: 6.5 (ref 5.0–8.0)

## 2016-05-01 LAB — TSH: TSH: 0.086 u[IU]/mL — ABNORMAL LOW (ref 0.350–4.500)

## 2016-05-01 LAB — BRAIN NATRIURETIC PEPTIDE: BNP: 445.8 pg/mL — AB (ref 0.0–100.0)

## 2016-05-01 NOTE — Telephone Encounter (Signed)
Pt family called and states that he had to go to the hosp today -- the hosp mentioned that is TSH was really low and that his PCP needed to address it - family aware that you will address on Thursday and we cal call pt back

## 2016-05-01 NOTE — ED Notes (Signed)
[place] paged to [phone].

## 2016-05-01 NOTE — ED Notes (Signed)
Patient complaining of generalized body pain. States "it doesn't hurt here and there, it hurts all over. I couldn't sleep last night."

## 2016-05-01 NOTE — ED Notes (Signed)
Patient was cleaned up from urine accident while in xray.  Advised patient that we needed a urine specimen and patient given a urinal.

## 2016-05-01 NOTE — ED Notes (Signed)
EDP at bedside  

## 2016-05-01 NOTE — ED Provider Notes (Signed)
CSN: [id number]     Arrival date & time 05/01/16  [id number] History  By signing my name below, I, [facility], attest that this documentation has been prepared under the direction and in the presence of Nat Christen, MD. Electronically Signed: Virgel Bouquet, ED Scribe. 05/01/2016. 12:24 PM.   Chief Complaint  Patient presents with  . Generalized Body Aches    The history is provided by the patient and a relative. No language interpreter was used.   HPI Comments: [name] is a 82 y.o. male with a hx of A-fib, COPD, HTN, dyslipidemia, hypothyroidism, CAD, and dilated cardiomyopathy who presents to the Emergency Department complaining of constant, mild, generalized body aches, worse in the knees and hips onset last night. Pt states that he spent time outdoors mowing his lawn on a riding lawnmower yesterday followed by pain to his whole body since last night. He reports mild CP. He has been eating a small amount of food. He has taken OTC pain medication without relief. He is compliant with all medications including his thyroid medication and coumadin. He notes that his thyroid function was normal during his most recent check-up and that his INR was 3.1. PCP is Dr. Claretta Fraise in [place], [place]. Per sister, pt fell 3-4 weeks ago and struck his right knee on the ground. Denies fevers, chills, difficulty urinating, or cough.  Past Medical History  Diagnosis Date  . Dilated cardiomyopathy ([place])     Felt to be secondary to atrial fibrillation. EF is about 40%  . Atrial fibrillation (HCC)     Persistent, treated with amiodarone and cardioversion  . Drug therapy     Chronic Coumadin therapy  . CAD (coronary artery disease)     Last catheterization in August 2008 demonstrated the LAD 40% stenosis  . Hypertension   . Dyslipidemia   . COPD (chronic obstructive pulmonary disease) ([place])   . Hypothyroidism     Possibly related to amiodarone   Past Surgical History  Procedure Laterality Date   . Hip surgery Left     8yo  . Cardiac catheterization  05/2007    LAD 40% stenosis. Stent in the LAD had 20% in-stent restenosis. Followed by 40-50% distal stenosis. 50% diagonal stenosis. 40-50% circumflex stenosis. 70% OM1 stenosis and 30% ostial RCA stenosis. 30% proximal RCA stenosis.  . Coronary stent placement     Family History  Problem Relation Age of Onset  . Adopted: Yes  . Heart attack Mother   . Heart attack Father   . Diabetes Other   . Stroke Other    Social History  Substance Use Topics  . Smoking status: Never Smoker   . Smokeless tobacco: Never Used  . Alcohol Use: No     Comment: quit at age 47yo    Review of Systems  A complete 10 system review of systems was obtained and all systems are negative except as noted in the HPI and PMH.    Allergies  Amiodarone and Penicillins  Home Medications   Prior to Admission medications   Medication Sig Start Date End Date Taking? Authorizing Provider  amLODipine (NORVASC) 10 MG tablet Take 10 mg by mouth daily.   Yes Historical Provider, MD  CALCIUM PO Take 600 mg by mouth daily.    Yes Historical Provider, MD  carvedilol (COREG) 12.5 MG tablet TAKE (1) TABLET TWICE A DAY. 11/23/15  Yes Claretta Fraise, MD  cholecalciferol (VITAMIN D) 1000 units tablet Take 1,000 Units by mouth daily.  Yes Historical Provider, MD  escitalopram (LEXAPRO) 5 MG tablet Take 1 tablet (5 mg total) by mouth daily. 03/04/16  Yes Claretta Fraise, MD  fenofibrate micronized (LOFIBRA) 134 MG capsule Take 134 mg by mouth daily before breakfast.   Yes Historical Provider, MD  fish oil-omega-3 fatty acids 1000 MG capsule Take 1,400 mg by mouth daily.    Yes Historical Provider, MD  furosemide (LASIX) 20 MG tablet Take 1 tablet (20 mg total) by mouth every morning. Note change in dose from 2/day to 1/day (profile) 04/30/16  Yes Tammy Eckard, PHARMD  gabapentin (NEURONTIN) 600 MG tablet Take 1 tablet (600 mg total) by mouth 3 (three) times daily. 03/13/16   Yes Claretta Fraise, MD  levothyroxine (SYNTHROID, LEVOTHROID) 150 MCG tablet TAKE 1 TABLET DAILY 04/11/16  Yes Claretta Fraise, MD  warfarin (COUMADIN) 5 MG tablet Take 1/2 tablet on Wednesdays.  Take 1 tablet all other days. 04/30/16  Yes Tammy Eckard, PHARMD  acetaminophen (TYLENOL) 500 MG tablet Take 1-2 tablets (500-1,000 mg total) by mouth 2 (two) times daily as needed. Patient taking differently: Take 500-1,000 mg by mouth 2 (two) times daily as needed. pain 03/28/16   Tammy Eckard, PHARMD  NITROSTAT 0.4 MG SL tablet Place 1 tablet (0.4 mg total) under the tongue every 5 (five) minutesas needed for chest pain. Patient not taking: Reported on 04/30/2016 11/14/15   Claretta Fraise, MD  traMADol (ULTRAM) 50 MG tablet Take 1 tablet (50 mg total) by mouth every 6 (six) hours as needed. Patient taking differently: Take 50 mg by mouth every 6 (six) hours as needed for moderate pain.  04/30/16   Claretta Fraise, MD   BP 132/72 mmHg  Pulse 27  Temp(Src) 98.1 F (36.7 C) (Oral)  Resp 17  Ht 5\' 6"  (1.676 m)  Wt 163 lb (73.936 kg)  BMI 26.32 kg/m2  SpO2 86% Physical Exam  Constitutional: He is oriented to person, place, and time. He appears well-developed and well-nourished.  Alert and answering questions appropriately.  HENT:  Head: Normocephalic and atraumatic.  Eyes: Conjunctivae are normal.  Neck: Neck supple.  Cardiovascular: Normal rate.  An irregular rhythm present.  Heart rate is normal with an Irregular rhythm.  Pulmonary/Chest: Effort normal and breath sounds normal.  Abdominal: Soft. Bowel sounds are normal.  Musculoskeletal: Normal range of motion. He exhibits edema and tenderness.  Right knee little bit puffy and tender but not erythematous.   Neurological: He is alert and oriented to person, place, and time.  Skin: Skin is warm and dry. No erythema.  Psychiatric: He has a normal mood and affect. His behavior is normal.  Nursing note and vitals reviewed.   ED Course   Procedures  DIAGNOSTIC STUDIES: Oxygen Saturation is 96% on RA, adequate by my interpretation.    COORDINATION OF CARE: 8:56 AM Will order labs. Discussed treatment plan with pt at bedside and pt agreed to plan.   Labs Review Labs Reviewed  CBC WITH DIFFERENTIAL/PLATELET - Abnormal; Notable for the following:    Hemoglobin 11.7 (*)    HCT 37.2 (*)    MCV 77.7 (*)    MCH 24.4 (*)    All other components within normal limits  COMPREHENSIVE METABOLIC PANEL - Abnormal; Notable for the following:    Glucose, Bld 138 (*)    BUN 35 (*)    Creatinine, Ser 1.33 (*)    AST 13 (*)    ALT 8 (*)    GFR calc non Af Amer 46 (*)  GFR calc Af Amer 53 (*)    All other components within normal limits  PROTIME-INR - Abnormal; Notable for the following:    Prothrombin Time 28.2 (*)    INR 2.69 (*)    All other components within normal limits  TSH - Abnormal; Notable for the following:    TSH 0.086 (*)    All other components within normal limits  URINALYSIS, ROUTINE W REFLEX MICROSCOPIC (NOT AT [facility])    Imaging Review Dg Chest 2 View  05/01/2016  CLINICAL DATA:  Weakness with generalized aching sensation. Hypertension. EXAM: CHEST  2 VIEW COMPARISON:  April 01, 2016 FINDINGS: There is no edema or consolidation. Heart is borderline enlarged with pulmonary vascularity within normal limits. There is atherosclerotic calcification throughout the aorta. No adenopathy. There is degenerative change in each shoulder and in the thoracic spine. IMPRESSION: Aortic atherosclerosis. Heart borderline enlarged. No edema or consolidation. Electronically Signed   By: Lowella Grip III M.D.   On: 05/01/2016 10:29   Dg Knee Complete 4 Views Left  05/01/2016  CLINICAL DATA:  Pain EXAM: LEFT KNEE - COMPLETE 4+ VIEW COMPARISON:  Mar 05, 2015 FINDINGS: Frontal, lateral, and bilateral oblique views were obtained. There is no acute fracture or dislocation. There is a small joint effusion. Multiple loose bodies are  again noted in the anterior aspect of the joint, stable. There is slight joint space narrowing medially and in the patellofemoral joint regions. No erosive change. There is extensive arterial vascular calcification. IMPRESSION: Mild osteoarthritic change. Loose bodies in the anterior aspect of the joint inferior to the patella noted, stable. Small joint effusion. No fracture. Extensive vascular atherosclerosis noted. Electronically Signed   By: Lowella Grip III M.D.   On: 05/01/2016 10:31   I have personally reviewed and evaluated these images and lab results as part of my medical decision-making.   EKG Interpretation   Date/Time:  Wednesday May 01 2016 08:54:39 EDT Ventricular Rate:  82 PR Interval:    QRS Duration: 139 QT Interval:  448 QTC Calculation: 401 R Axis:   72 Text Interpretation:  Atrial fibrillation Ventricular bigeminy IVCD,  consider atypical LBBB Confirmed by Andra Matsuo  MD, Rogerick Baldwin ([id number]) on 05/01/2016  11:06:20 AM      MDM   Final diagnoses:  Premature ventricular contraction    Patient is in no acute distress. He says he feels better. He does have multiple PVCs on his EKG. I do not agree with the computer readout of atrial fibrillation. TSH is also suboptimal. He may be on too much thyroid medicine. Discussed with cardiologist and the patient and his family.  I personally performed the services described in this documentation, which was scribed in my presence. The recorded information has been reviewed and is accurate.     Nat Christen, MD 05/01/16 1346

## 2016-05-01 NOTE — Discharge Instructions (Signed)
Premature Ventricular Contraction A premature ventricular contraction is an irregularity in the normal heart rhythm. These contractions are extra heartbeats that occur too early in the normal sequence. In most cases, these contractions are harmless and do not require treatment. CAUSES Premature ventricular contractions may occur without a known cause. In healthy people, the extra contractions may be caused by:  Smoking.  Drinking alcohol.  Caffeine.  Certain medicines.  Some illegal drugs.  Stress. Sometimes, changes in chemicals in the blood (electrolytes) can also cause premature ventricular contractions. They can also occur in people with heart diseases that cause a decrease in blood flow to the heart. SIGNS AND SYMPTOMS Premature ventricular contractions often do not cause any symptoms. In some cases, you may have a feeling of your heart beating fast or skipping a beat (palpitations). DIAGNOSIS Your health care provider will take your medical history and do a physical exam. During the exam, the health care provider will check for irregular heartbeats. Various tests may be done to help diagnose premature ventricular contractions. These tests may include:  An ECG (electrocardiogram) to monitor the electrical activity of your heart.  Holter monitor testing. A Holter monitor is a portable device that can monitor the electrical activity of your heart over longer periods of time.  Stress tests to see how exercise affects your heart rhythm.  Echocardiogram. This test uses sound waves (ultrasound) to produce an image of your heart.  Electrophysiology study. This is used to evaluate the electrical conduction system of your heart. TREATMENT Usually, no treatment is needed. You may be advised to avoid things that can trigger the premature contractions, such as caffeine or alcohol. Medicines are sometimes given if symptoms are severe or if the extra heartbeats are very frequent. Treatment may  also be needed for an underlying cause of the contractions if one is found. HOME CARE INSTRUCTIONS  Take medicines only as directed by your health care provider.  Make any lifestyle changes recommended by your health care provider. These may include:  Quitting smoking.  Avoiding or limiting caffeine or alcohol.  Exercising. Talk to your health care provider about what type of exercise is safe for you.  Trying to reduce stress.  Keep all follow-up visits with your health care provider. This is important. SEEK IMMEDIATE MEDICAL CARE IF:  You feel palpitations that are frequent or continual.  You have chest pain.  You have shortness of breath.  You have sweating for no reason.  You have nausea and vomiting.  You become light-headed or faint.   This information is not intended to replace advice given to you by your health care provider. Make sure you discuss any questions you have with your health care provider.   Document Released: 05/17/2004 Document Revised: 10/21/2014 Document Reviewed: 03/03/2014 Elsevier Interactive Patient Education 2016 [employer].   Blood work shows no life-threatening condition. You do have premature ventricular contractions. Recommend follow-up with cardiologist. Phone number given. Your thyroid tests show she may have too much thyroid medication in your system. TSH was 0.086.    This information can be followed up by your primary care doctor.

## 2016-05-02 ENCOUNTER — Other Ambulatory Visit: Payer: Self-pay | Admitting: Family Medicine

## 2016-05-02 ENCOUNTER — Ambulatory Visit (INDEPENDENT_AMBULATORY_CARE_PROVIDER_SITE_OTHER): Payer: Medicare Other | Admitting: Cardiology

## 2016-05-02 ENCOUNTER — Other Ambulatory Visit: Payer: Self-pay

## 2016-05-02 ENCOUNTER — Encounter: Payer: Self-pay | Admitting: Surgery

## 2016-05-02 ENCOUNTER — Encounter: Payer: Self-pay | Admitting: Cardiology

## 2016-05-02 VITALS — BP 128/58 | HR 55 | Ht 66.0 in | Wt 161.0 lb

## 2016-05-02 DIAGNOSIS — M25462 Effusion, left knee: Secondary | ICD-10-CM

## 2016-05-02 DIAGNOSIS — M25562 Pain in left knee: Secondary | ICD-10-CM

## 2016-05-02 DIAGNOSIS — I6523 Occlusion and stenosis of bilateral carotid arteries: Secondary | ICD-10-CM | POA: Diagnosis not present

## 2016-05-02 DIAGNOSIS — I5022 Chronic systolic (congestive) heart failure: Secondary | ICD-10-CM | POA: Diagnosis not present

## 2016-05-02 DIAGNOSIS — I480 Paroxysmal atrial fibrillation: Secondary | ICD-10-CM

## 2016-05-02 DIAGNOSIS — I493 Ventricular premature depolarization: Secondary | ICD-10-CM | POA: Diagnosis not present

## 2016-05-02 DIAGNOSIS — I2510 Atherosclerotic heart disease of native coronary artery without angina pectoris: Secondary | ICD-10-CM

## 2016-05-02 NOTE — Progress Notes (Signed)
Clinical Summary Mr. Roblyer is a 82 y.o.male seen today for hospital follow up.   1. CAD - cath 2008 with mild nonobstructive CAD - denies any recent chest pain, has chronic diffuse muscle aches/joint pains only  2. Chronic systolic/diasotlic HF - echo [id number] LVEF 35-40%, grade II diastolic dysfunction - from cath 2008 LVEF as low as 15%.  - no SOB or DOE. Occasional LE edema.  - compliant with meds. Limiting sodium intake. Avoiding NSAIDs - not on ACE-I, I assume due to labile renal function.   3. Afib - on anticoag - denies any palpitations - denies any bleeding issues on coumadin.  - not interested in NOACs  4. Carotid stenosis - moderate bilateral ICA disease - denies any neuro symptoms  5. PVCs - noted on previous admission - he denies any palpitations  Past Medical History  Diagnosis Date  . Dilated cardiomyopathy ([place])     Felt to be secondary to atrial fibrillation. EF is about 40%  . Atrial fibrillation ([facility])     Persistent, treated with amiodarone and cardioversion  . Drug therapy     Chronic Coumadin therapy  . CAD (coronary artery disease)     Last catheterization in August 2008 demonstrated the LAD 40% stenosis  . Hypertension   . Dyslipidemia   . COPD (chronic obstructive pulmonary disease) ([place])   . Hypothyroidism     Possibly related to amiodarone     Allergies  Allergen Reactions  . Amiodarone     Neuropathy  . Penicillins Itching    Has patient had a PCN reaction causing immediate rash, facial/tongue/throat swelling, SOB or lightheadedness with hypotension: Nounknown Has patient had a PCN reaction causing severe rash involving mucus membranes or skin necrosis: Nounknown Has patient had a PCN reaction that required hospitalization Nono Has patient had a PCN reaction occurring within the last 10 years: Nono If all of the above answers are "NO", then may proceed with Ceph     Current Outpatient Prescriptions  Medication Sig Dispense  Refill  . acetaminophen (TYLENOL) 500 MG tablet Take 1-2 tablets (500-1,000 mg total) by mouth 2 (two) times daily as needed. (Patient taking differently: Take 500-1,000 mg by mouth 2 (two) times daily as needed. pain) 30 tablet 0  . amLODipine (NORVASC) 10 MG tablet Take 10 mg by mouth daily.    Marland Kitchen CALCIUM PO Take 600 mg by mouth daily.     . carvedilol (COREG) 12.5 MG tablet TAKE (1) TABLET TWICE A DAY. 60 tablet 5  . cholecalciferol (VITAMIN D) 1000 units tablet Take 1,000 Units by mouth daily.    Marland Kitchen escitalopram (LEXAPRO) 5 MG tablet Take 1 tablet (5 mg total) by mouth daily. 30 tablet 2  . fenofibrate micronized (LOFIBRA) 134 MG capsule Take 134 mg by mouth daily before breakfast.    . fish oil-omega-3 fatty acids 1000 MG capsule Take 1,400 mg by mouth daily.     . furosemide (LASIX) 20 MG tablet Take 1 tablet (20 mg total) by mouth every morning. Note change in dose from 2/day to 1/day (profile) 30 tablet 2  . gabapentin (NEURONTIN) 600 MG tablet Take 1 tablet (600 mg total) by mouth 3 (three) times daily. 90 tablet 2  . levothyroxine (SYNTHROID, LEVOTHROID) 150 MCG tablet TAKE 1 TABLET DAILY 30 tablet 5  . NITROSTAT 0.4 MG SL tablet Place 1 tablet (0.4 mg total) under the tongue every 5 (five) minutesas needed for chest pain. (Patient not taking: Reported on  04/30/2016) 25 tablet 0  . traMADol (ULTRAM) 50 MG tablet Take 1 tablet (50 mg total) by mouth every 6 (six) hours as needed. (Patient taking differently: Take 50 mg by mouth every 6 (six) hours as needed for moderate pain. ) 40 tablet 2  . warfarin (COUMADIN) 5 MG tablet Take 1/2 tablet on Wednesdays.  Take 1 tablet all other days. 32 tablet 2   No current facility-administered medications for this visit.     Past Surgical History  Procedure Laterality Date  . Hip surgery Left     8yo  . Cardiac catheterization  05/2007    LAD 40% stenosis. Stent in the LAD had 20% in-stent restenosis. Followed by 40-50% distal stenosis. 50%  diagonal stenosis. 40-50% circumflex stenosis. 70% OM1 stenosis and 30% ostial RCA stenosis. 30% proximal RCA stenosis.  . Coronary stent placement       Allergies  Allergen Reactions  . Amiodarone     Neuropathy  . Penicillins Itching    Has patient had a PCN reaction causing immediate rash, facial/tongue/throat swelling, SOB or lightheadedness with hypotension: Nounknown Has patient had a PCN reaction causing severe rash involving mucus membranes or skin necrosis: Nounknown Has patient had a PCN reaction that required hospitalization Nono Has patient had a PCN reaction occurring within the last 10 years: Nono If all of the above answers are "NO", then may proceed with Ceph      Family History  Problem Relation Age of Onset  . Adopted: Yes  . Heart attack Mother   . Heart attack Father   . Diabetes Other   . Stroke Other      Social History Mr. Eckstrom reports that he has never smoked. He has never used smokeless tobacco. Mr. Seiler reports that he does not drink alcohol.   Review of Systems CONSTITUTIONAL: No weight loss, fever, chills, weakness or fatigue.  HEENT: Eyes: No visual loss, blurred vision, double vision or yellow sclerae.No hearing loss, sneezing, congestion, runny nose or sore throat.  SKIN: No rash or itching.  CARDIOVASCULAR: per HPI RESPIRATORY: No shortness of breath, cough or sputum.  GASTROINTESTINAL: No anorexia, nausea, vomiting or diarrhea. No abdominal pain or blood.  GENITOURINARY: No burning on urination, no polyuria NEUROLOGICAL: No headache, dizziness, syncope, paralysis, ataxia, numbness or tingling in the extremities. No change in bowel or bladder control.  MUSCULOSKELETAL: MSK pain  LYMPHATICS: No enlarged nodes. No history of splenectomy.  PSYCHIATRIC: No history of depression or anxiety.  ENDOCRINOLOGIC: No reports of sweating, cold or heat intolerance. No polyuria or polydipsia.  Marland Kitchen   Physical Examination Filed Vitals:   05/02/16  1543  BP: 128/58  Pulse: 55   Filed Vitals:   05/02/16 1543  Height: 5\' 6"  (1.676 m)  Weight: 161 lb (73.029 kg)    Gen: resting comfortably, no acute distress HEENT: no scleral icterus, pupils equal round and reactive, no palptable cervical adenopathy,  CV: RRR, 2/6 systolic murmur apex, no jvd Resp: Clear to auscultation bilaterally GI: abdomen is soft, non-tender, non-distended, normal bowel sounds, no hepatosplenomegaly MSK: extremities are warm, no edema.  Skin: warm, no rash Neuro:  no focal deficits Psych: appropriate affect   Diagnostic Studies 03/2016 carotid US IMPRESSION: 1. Grossly unchanged moderate to large amount of left-sided atherosclerotic plaque resulting in elevated peak systolic velocities within the left internal carotid artery compatible with the 50-69% luminal narrowing range. Further evaluation with CTA could be performed as clinically indicated. 2. Grossly unchanged moderate to large amount of  right-sided atherosclerotic plaque, not resulting in hemodynamically significant stenosis. 3. Interval development of abnormal waveforms throughout the interrogated portions of both carotid systems potentially indicative of bigeminy or aortic regurgitation. Further evaluation ECG and cardiac echo is recommended. 4. Atherosclerosis (ICD10-170.0) These results will be called to the ordering clinician or representative by the Radiologist Assistant, and communication documented in the PACS or zVision Dashboard.   03/2016 Echo Study Conclusions  - Left ventricle: The cavity size was normal. Wall thickness was  increased in a pattern of mild LVH. Systolic function was  moderately reduced. The estimated ejection fraction was in the  range of 35% to 40%. Diffuse hypokinesis. Features are consistent  with a pseudonormal left ventricular filling pattern, with  concomitant abnormal relaxation and increased filling pressure  (grade 2 diastolic  dysfunction). - Aortic valve: Mildly to moderately calcified annulus. Trileaflet;  moderately thickened leaflets. There was mild stenosis. Valve  area (VTI): 1.62 cm^2. - Mitral valve: Severely calcified annulus. Moderately thickened  leaflets . - Left atrium: The atrium was severely dilated. - Right ventricle: The cavity size was mildly dilated. - Right atrium: The atrium was moderately dilated. - Technically adequate study.   05/2007 cath CONCLUSIONS: 1. Severe reduction in left ventricular function, probably related to  poor ventricular rate control with atrial fibrillation. The left  ventricular function is much worse than on the previous study of  2003. 2. Continued patency of the Taxus drug-eluting stent. 3. Moderate stenosis of the large marginal Adanely Reynoso as noted above. 4. Fairly high-grade stenosis of a small first diagonal Syrai Gladwin, with a  90-degree take-off from the a main vessel.      Assessment and Plan  1. CAD - nonobstructive CAD by previous cath 2008 - no recent symptoms, continue current meds  2. Chronic systolic/diastolic HF - appears euvolemic, no current symptoms - continue medical therapy. Assume no ACE-I/ARB due to labile renal function  3. Afib - no current symptoms. He is not interested in NOACs. CHADS2Vasc score of 5, continue coumadin  5. Carotid stenosis - no recent symptoms, continue to monitor per Dr Percival Spanish  6. PVCs - frequent PVCs on EKG, patient denies any symptoms - we will obtain 24 hr holter to assess PVC burden. If high burden may be worth evaluating for current ischemia, is setting of systolic dysfunction consider therapies for PVC suppression, though with resting bradycardia and amio allergy and structural heart disease would have limited options.    F/u Dr Percival Spanish 6 weeks.    Arnoldo Lenis, M.D.

## 2016-05-02 NOTE — Patient Instructions (Signed)
Medication Instructions:  Your physician recommends that you continue on your current medications as directed. Please refer to the Current Medication list given to you today.    Labwork: NONE  Testing/Procedures: Your physician has recommended that you wear a holter monitor. Holter monitors are medical devices that record the heart's electrical activity. Doctors most often use these monitors to diagnose arrhythmias. Arrhythmias are problems with the speed or rhythm of the heartbeat. The monitor is a small, portable device. You can wear one while you do your normal daily activities. This is usually used to diagnose what is causing palpitations/syncope (passing out). * 24 *    Follow-Up: Your physician recommends that you schedule a follow-up appointment in: [place] DR. HOCHREIN     Any Other Special Instructions Will Be Listed Below (If Applicable).     If you need a refill on your cardiac medications before your next appointment, please call your pharmacy.

## 2016-05-03 ENCOUNTER — Ambulatory Visit (INDEPENDENT_AMBULATORY_CARE_PROVIDER_SITE_OTHER): Payer: Medicare Other | Admitting: Family Medicine

## 2016-05-03 ENCOUNTER — Encounter: Payer: Self-pay | Admitting: Family Medicine

## 2016-05-03 ENCOUNTER — Telehealth: Payer: Self-pay | Admitting: Family Medicine

## 2016-05-03 VITALS — BP 120/64 | HR 62 | Temp 97.9°F | Ht 66.0 in | Wt 163.6 lb

## 2016-05-03 DIAGNOSIS — M25562 Pain in left knee: Secondary | ICD-10-CM | POA: Diagnosis not present

## 2016-05-03 DIAGNOSIS — G4700 Insomnia, unspecified: Secondary | ICD-10-CM

## 2016-05-03 MED ORDER — TRAZODONE HCL 150 MG PO TABS: ORAL_TABLET | ORAL | Status: DC

## 2016-05-03 MED ORDER — LEVOTHYROXINE SODIUM 125 MCG PO TABS: 125.0000 ug | ORAL_TABLET | Freq: Every day | ORAL | Status: DC

## 2016-05-03 NOTE — Telephone Encounter (Signed)
Pt is in office now

## 2016-05-03 NOTE — Telephone Encounter (Signed)
Spoke with pt and he states his knee is more swollen and it is hurting and something needs to be done. I offered him an appt with another provider as you are only here half a day but the pt wanted to see what you thinks he should do. Please advise.

## 2016-05-03 NOTE — Progress Notes (Signed)
Subjective:  Patient ID: [name], male    DOB: 1926/06/01  Age: 82 y.o. MRN: [id number]  CC: No chief complaint on file.   HPI Declen Homrich presents for Worsening pain in the left knee. He has an MRI set up for August for the pain has become intolerable. The last joint injection did not help at all. He is requesting repeat injection due to the intense level of pain. He's had no relief from the pain medicine prescribed several days ago.Also has pending orthopedic appointment.  Patient presents for follow-up on  thyroid. The patient has a history of hypothyroidism for many years. It has been stable recently. Pt. denies any change in  voice, loss of hair, heat or cold intolerance. Energy level has been adequate to good. Patient denies constipation and diarrhea. No myxedema. Medication is as noted below. Verified that pt is taking it daily on an empty stomach. Well tolerated.   History Roderich has a past medical history of Atrial fibrillation ([place]); CAD (coronary artery disease); COPD (chronic obstructive pulmonary disease) ([place]); Dilated cardiomyopathy ([place]); Drug therapy; Dyslipidemia; Hypertension; and Hypothyroidism.   He has a past surgical history that includes Hip surgery (Left); Cardiac catheterization (05/2007); and Coronary stent placement.   His family history includes Diabetes in his other; Heart attack in his father and mother; Stroke in his other. He was adopted.He reports that he has never smoked. He has never used smokeless tobacco. He reports that he does not drink alcohol or use drugs.    ROS Review of Systems  Constitutional: Negative for chills, diaphoresis and fever.  HENT: Negative for rhinorrhea and sore throat.   Respiratory: Negative for cough and shortness of breath.   Cardiovascular: Negative for chest pain.  Gastrointestinal: Negative for abdominal pain.  Musculoskeletal: Positive for arthralgias. Negative for myalgias.  Skin: Negative for rash.    Neurological: Negative for weakness and headaches.    Objective:  BP 120/64   Pulse 62   Temp 97.9 F (36.6 C) (Oral)   Ht 5\' 6"  (1.676 m)   Wt 163 lb 9.6 oz (74.2 kg)   BMI 26.41 kg/m    BP Readings from Last 3 Encounters:  05/03/16 120/64  05/02/16 (!) 128/58  05/01/16 151/77    Wt Readings from Last 3 Encounters:  05/03/16 163 lb 9.6 oz (74.2 kg)  05/02/16 161 lb (73 kg)  05/01/16 163 lb (73.9 kg)     Physical Exam  Constitutional: He is oriented to person, place, and time. He appears well-developed and well-nourished.  HENT:  Head: Normocephalic and atraumatic.  Right Ear: External ear normal.  Left Ear: External ear normal.  Mouth/Throat: No oropharyngeal exudate or posterior oropharyngeal erythema.  Eyes: Pupils are equal, round, and reactive to light.  Neck: Normal range of motion. Neck supple.  Cardiovascular: Normal rate and regular rhythm.   No murmur heard. Pulmonary/Chest: Breath sounds normal. No respiratory distress.  Musculoskeletal: He exhibits tenderness (Marked anterior joint line tenderness with effusion left knee).  Neurological: He is alert and oriented to person, place, and time.  Vitals reviewed.    Lab Results  Component Value Date   WBC 5.9 05/01/2016   HGB 11.7 (L) 05/01/2016   HCT 37.2 (L) 05/01/2016   PLT 384 05/01/2016   GLUCOSE 138 (H) 05/01/2016   CHOL 188 12/26/2015   TRIG 360 (H) 12/26/2015   HDL 25 (L) 12/26/2015   LDLCALC 91 12/26/2015   ALT 8 (L) 05/01/2016   AST 13 (L) 05/01/2016  NA 139 05/01/2016   K 3.8 05/01/2016   CL 106 05/01/2016   CREATININE 1.33 (H) 05/01/2016   BUN 35 (H) 05/01/2016   CO2 25 05/01/2016   TSH 0.086 (L) 05/01/2016   INR 2.69 (H) 05/01/2016   HGBA1C 6.1 07/14/2015    Dg Chest 2 View  05/01/2016  CLINICAL DATA:  Weakness with generalized aching sensation. Hypertension. EXAM: CHEST  2 VIEW COMPARISON:  April 01, 2016 FINDINGS: There is no edema or consolidation. Heart is borderline  enlarged with pulmonary vascularity within normal limits. There is atherosclerotic calcification throughout the aorta. No adenopathy. There is degenerative change in each shoulder and in the thoracic spine. IMPRESSION: Aortic atherosclerosis. Heart borderline enlarged. No edema or consolidation. Electronically Signed   By: Lowella Grip III M.D.   On: 05/01/2016 10:29   Dg Knee Complete 4 Views Left  05/01/2016  CLINICAL DATA:  Pain EXAM: LEFT KNEE - COMPLETE 4+ VIEW COMPARISON:  Mar 05, 2015 FINDINGS: Frontal, lateral, and bilateral oblique views were obtained. There is no acute fracture or dislocation. There is a small joint effusion. Multiple loose bodies are again noted in the anterior aspect of the joint, stable. There is slight joint space narrowing medially and in the patellofemoral joint regions. No erosive change. There is extensive arterial vascular calcification. IMPRESSION: Mild osteoarthritic change. Loose bodies in the anterior aspect of the joint inferior to the patella noted, stable. Small joint effusion. No fracture. Extensive vascular atherosclerosis noted. Electronically Signed   By: Lowella Grip III M.D.   On: 05/01/2016 10:31    Assessment & Plan:   Diagnoses and all orders for this visit:  Left knee pain  Insomnia  Other orders -     traZODone (DESYREL) 150 MG tablet; Use from 1/3 to 1 tablet nightly as needed for sleep. -     levothyroxine (SYNTHROID, LEVOTHROID) 125 MCG tablet; Take 1 tablet (125 mcg total) by mouth daily.    A steroid injection was performed at left lateral joint line using 1% plain Lidocaine for local. Aspiration attempted with no return. Susequently joint injected with 2 cc marcan and 6 mg of Celestone. This was well tolerated.   I have changed Mr. Avellino levothyroxine. I am also having him start on traZODone. Additionally, I am having him maintain his fish oil-omega-3 fatty acids, CALCIUM PO, NITROSTAT, escitalopram, gabapentin,  cholecalciferol, acetaminophen, amLODipine, fenofibrate micronized, traMADol, warfarin, furosemide, and carvedilol.  Meds ordered this encounter  Medications  . traZODone (DESYREL) 150 MG tablet    Sig: Use from 1/3 to 1 tablet nightly as needed for sleep.    Dispense:  30 tablet    Refill:  5  . levothyroxine (SYNTHROID, LEVOTHROID) 125 MCG tablet    Sig: Take 1 tablet (125 mcg total) by mouth daily.    Dispense:  30 tablet    Refill:  2     Follow-up: Return in about 2 weeks (around 05/17/2016).  Claretta Fraise, M.D.

## 2016-05-03 NOTE — Telephone Encounter (Signed)
Have him come at 1PM.

## 2016-05-07 ENCOUNTER — Ambulatory Visit (HOSPITAL_COMMUNITY)
Admission: RE | Admit: 2016-05-07 | Discharge: 2016-05-07 | Disposition: A | Discharge status: Home / Self Care | Payer: Medicare Other | Source: Ambulatory Visit | Attending: Cardiology | Admitting: Cardiology

## 2016-05-07 DIAGNOSIS — I493 Ventricular premature depolarization: Secondary | ICD-10-CM | POA: Insufficient documentation

## 2016-05-08 ENCOUNTER — Encounter: Payer: Self-pay | Admitting: Surgery

## 2016-05-08 ENCOUNTER — Ambulatory Visit (INDEPENDENT_AMBULATORY_CARE_PROVIDER_SITE_OTHER): Payer: Medicare Other | Admitting: Surgery

## 2016-05-08 VITALS — BP 150/60 | HR 70 | Temp 97.0°F | Resp 16 | Ht 66.0 in | Wt 159.0 lb

## 2016-05-08 DIAGNOSIS — I6522 Occlusion and stenosis of left carotid artery: Secondary | ICD-10-CM | POA: Diagnosis not present

## 2016-05-08 NOTE — Progress Notes (Signed)
Vascular and Vein Specialist of [facility]  Patient name: [name] MRN: [id number] DOB: [birth year] Sex: male  REFERRING PHYSICIAN: Dr. Percival Spanish  REASON FOR CONSULT: carotid stenosis  HPI: [name] is a 82 y.o. male, who is referred today for evaluation of carotid stenosis.  The patient recently had a carotid duplex performed at [facility] which was suggestive of 50-69 percent left carotid stenosis and no significant right carotid stenosis.  The patient is asymptomatic.  Specifically, he denies numbness or weakness in either extremity.  He denies slurred speech.  He denies amaurosis fugax.  He remains very active.  He continues to mow his lawn.  He is currently wearing a Holter monitor for irregular heartbeat.  He does have a history of atrial fibrillation, for which he takes Coumadin.  The patient has a history of coronary artery disease.  He was cathed in 2008 with mild nonobstructive coronary artery disease.  He does not have any chest pain.  He does have chronic heart failure with an ejection fraction of 35-40 percent.  He suffers from hypertension on multiple medications.  He also suffers from hypercholesterolemia.  He does not take a statin.  He is a nonsmoker.     Past Medical History:  Diagnosis Date  . Atrial fibrillation (HCC)    Persistent, treated with amiodarone and cardioversion  . CAD (coronary artery disease)    Last catheterization in August 2008 demonstrated the LAD 40% stenosis  . COPD (chronic obstructive pulmonary disease) ([place])   . Dilated cardiomyopathy ([place])    Felt to be secondary to atrial fibrillation. EF is about 40%  . Drug therapy    Chronic Coumadin therapy  . Dyslipidemia   . Hypertension   . Hypothyroidism    Possibly related to amiodarone    Family History  Problem Relation Age of Onset  . Adopted: Yes  . Heart attack Mother   . Heart attack Father   . Diabetes Other   . Stroke Other     SOCIAL  HISTORY: Social History   Social History  . Marital status: Widowed    Spouse name: N/A  . Number of children: N/A  . Years of education: N/A   Occupational History  . Part Time    Social History Main Topics  . Smoking status: Never Smoker  . Smokeless tobacco: Never Used  . Alcohol use No     Comment: quit at age 89yo  . Drug use: No  . Sexual activity: No   Other Topics Concern  . Not on file   Social History Narrative   Widowed   Gets regular exercise      Current Outpatient Prescriptions  Medication Sig Dispense Refill  . acetaminophen (TYLENOL) 500 MG tablet Take 1-2 tablets (500-1,000 mg total) by mouth 2 (two) times daily as needed. (Patient taking differently: Take 500-1,000 mg by mouth 2 (two) times daily as needed. pain) 30 tablet 0  . amLODipine (NORVASC) 10 MG tablet Take 10 mg by mouth daily.    Marland Kitchen CALCIUM PO Take 600 mg by mouth daily.     . carvedilol (COREG) 12.5 MG tablet TAKE (1) TABLET TWICE A DAY. 60 tablet 5  . cholecalciferol (VITAMIN D) 1000 units tablet Take 1,000 Units by mouth daily.    Marland Kitchen escitalopram (LEXAPRO) 5 MG tablet Take 1 tablet (5 mg total) by mouth daily. 30 tablet 2  . fenofibrate micronized (LOFIBRA) 134 MG capsule Take 134 mg by mouth daily before breakfast.    .  fish oil-omega-3 fatty acids 1000 MG capsule Take 1,400 mg by mouth daily.     . furosemide (LASIX) 20 MG tablet Take 1 tablet (20 mg total) by mouth every morning. Note change in dose from 2/day to 1/day (profile) 30 tablet 2  . gabapentin (NEURONTIN) 600 MG tablet Take 1 tablet (600 mg total) by mouth 3 (three) times daily. 90 tablet 2  . levothyroxine (SYNTHROID, LEVOTHROID) 125 MCG tablet Take 1 tablet (125 mcg total) by mouth daily. 30 tablet 2  . NITROSTAT 0.4 MG SL tablet Place 1 tablet (0.4 mg total) under the tongue every 5 (five) minutesas needed for chest pain. 25 tablet 0  . traMADol (ULTRAM) 50 MG tablet Take 1 tablet (50 mg total) by mouth every 6 (six) hours as  needed. (Patient taking differently: Take 50 mg by mouth every 6 (six) hours as needed for moderate pain. ) 40 tablet 2  . traZODone (DESYREL) 150 MG tablet Use from 1/3 to 1 tablet nightly as needed for sleep. 30 tablet 5  . warfarin (COUMADIN) 5 MG tablet Take 1/2 tablet on Wednesdays.  Take 1 tablet all other days. 32 tablet 2   No current facility-administered medications for this visit.     REVIEW OF SYSTEMS:  [X]  denotes positive finding, [ ]  denotes negative finding Cardiac  Comments:  Chest pain or chest pressure:    Shortness of breath upon exertion:    Short of breath when lying flat:    Irregular heart rhythm: x       Vascular    Pain in calf, thigh, or hip brought on by ambulation:    Pain in feet at night that wakes you up from your sleep:     Blood clot in your veins:    Leg swelling:  x       Pulmonary    Oxygen at home:    Productive cough:     Wheezing:         Neurologic    Sudden weakness in arms or legs:     Sudden numbness in arms or legs:     Sudden onset of difficulty speaking or slurred speech:    Temporary loss of vision in one eye:     Problems with dizziness:         Gastrointestinal    Blood in stool:     Vomited blood:         Genitourinary    Burning when urinating:     Blood in urine:        Psychiatric    Major depression:         Hematologic    Bleeding problems:    Problems with blood clotting too easily:        Skin    Rashes or ulcers:        Constitutional    Fever or chills:      PHYSICAL EXAM: Vitals:   05/08/16 1039 05/08/16 1046  BP: (!) 149/62 (!) 150/60  Pulse: 70   Resp: 16   Temp: 97 F (36.1 C)   Weight: 159 lb (72.1 kg)   Height: 5\' 6"  (1.676 m)     GENERAL: The patient is a well-nourished male, in no acute distress. The vital signs are documented above. CARDIAC: There is a regular rate and rhythm.  VASCULAR: No carotid bruits.  I cannot palpate pedal pulses.  Mild edema.   PULMONARY: There is good  air exchange bilaterally without wheezing  or rales. ABDOMEN: Soft and non-tender with normal pitched bowel sounds. I do not palpate a pulsatile mass  MUSCULOSKELETAL: There are no major deformities or cyanosis. NEUROLOGIC: No focal weakness or paresthesias are detected. SKIN: There are no ulcers or rashes noted. PSYCHIATRIC: The patient has a normal affect.  DATA:  I have reviewed his outside ultrasound was suggestive 50-69 percent left carotid stenosis and no significant right stenosis  MEDICAL ISSUES: Asymptomatic left carotid stenosis: I discussed with the patient that as long as his degree of stenosis remains less than 80%, and as long as he remains asymptomatic, I would not recommend surgical intervention, rather continuing with medical management of his cardiovascular disease.  I would recommend starting him on a statin as I do not see this is in his medication profile.  We also discussed the signs and symptoms of stroke/TIA, and to go to the hospital emergently should these occur.  The patient does have a focal T walking.  I cannot palpate pedal pulses, however his walking is limited by his left knee.  I do not think he has claudication symptoms.  I would continue to monitor this in the future.  I have him scheduled for follow-up with me in one year with a repeat carotid duplex.  Annamarie Major, MD Vascular and Vein Specialists of [facility] [phone] Pager [phone]

## 2016-05-13 ENCOUNTER — Other Ambulatory Visit: Payer: Self-pay | Admitting: Cardiology

## 2016-05-13 NOTE — Telephone Encounter (Signed)
REFILL 

## 2016-05-14 ENCOUNTER — Ambulatory Visit (HOSPITAL_COMMUNITY)
Admission: RE | Admit: 2016-05-14 | Discharge: 2016-05-14 | Disposition: A | Discharge status: Home / Self Care | Payer: Medicare Other | Source: Ambulatory Visit | Attending: Family Medicine | Admitting: Family Medicine

## 2016-05-14 ENCOUNTER — Ambulatory Visit (HOSPITAL_COMMUNITY): Payer: Medicare Other

## 2016-05-14 DIAGNOSIS — M25462 Effusion, left knee: Secondary | ICD-10-CM | POA: Diagnosis not present

## 2016-05-14 DIAGNOSIS — M25862 Other specified joint disorders, left knee: Secondary | ICD-10-CM | POA: Diagnosis not present

## 2016-05-14 DIAGNOSIS — M659 Synovitis and tenosynovitis, unspecified: Secondary | ICD-10-CM | POA: Insufficient documentation

## 2016-05-14 DIAGNOSIS — X58XXXA Exposure to other specified factors, initial encounter: Secondary | ICD-10-CM | POA: Diagnosis not present

## 2016-05-14 DIAGNOSIS — M25562 Pain in left knee: Secondary | ICD-10-CM | POA: Diagnosis not present

## 2016-05-14 DIAGNOSIS — S83242A Other tear of medial meniscus, current injury, left knee, initial encounter: Secondary | ICD-10-CM | POA: Diagnosis not present

## 2016-05-15 ENCOUNTER — Ambulatory Visit (INDEPENDENT_AMBULATORY_CARE_PROVIDER_SITE_OTHER): Payer: Medicare Other | Admitting: Pharmacist

## 2016-05-15 DIAGNOSIS — I480 Paroxysmal atrial fibrillation: Secondary | ICD-10-CM | POA: Diagnosis not present

## 2016-05-15 LAB — COAGUCHEK XS/INR WAIVED
INR: 2.7 — AB (ref 0.9–1.1)
PROTHROMBIN TIME: 32.8 s

## 2016-05-16 ENCOUNTER — Telehealth: Payer: Self-pay | Admitting: Cardiology

## 2016-05-17 ENCOUNTER — Telehealth: Payer: Self-pay | Admitting: *Deleted

## 2016-05-17 NOTE — Telephone Encounter (Signed)
Please let him know we are working on referral to orthopedics

## 2016-05-17 NOTE — Telephone Encounter (Signed)
Left detailed message stating we are working on his referral and once appt has been made we will call the pt with all of the appt details and to call back with any further questions or concerns.

## 2016-05-17 NOTE — Telephone Encounter (Signed)
Worsening knee pain per pt What is next step Please advise

## 2016-05-20 ENCOUNTER — Telehealth: Payer: Self-pay | Admitting: Family Medicine

## 2016-05-20 NOTE — Telephone Encounter (Signed)
Per other notes - ortho to be set up for 8/31-[place]   He is in tremendous pain and needs something for pain - he would like appt sooner but to get Dr Elmyra Ricks - that's nearly impossible.   Please advise as coverage for Stacks.

## 2016-05-20 NOTE — Telephone Encounter (Signed)
He has areas of calcification in his knee is why he is having that pain. He should rest the knee, ice knee, keep taking tramadol that he should still have at home, was given Rx with refills at last visit with Dr. Livia Snellen, can use the OTC creams with lidocaine such as anacreme. I don't think we'll be able to move appt up any sooner with Dr. Elmyra Ricks.

## 2016-05-20 NOTE — Telephone Encounter (Signed)
Pt aware of recommendations

## 2016-05-21 ENCOUNTER — Telehealth: Payer: Self-pay | Admitting: *Deleted

## 2016-05-21 NOTE — Telephone Encounter (Signed)
Pt aware, reminded of 9/13 appt with Dr. Percival Spanish.

## 2016-05-21 NOTE — Telephone Encounter (Signed)
-----   Message from Arnoldo Lenis, MD sent at 05/16/2016 12:54 PM EDT ----- Please let patient know that his heart monitor does show fairly frequent extra heart beats. I have forwarded the results to Dr Percival Spanish, he may consider some further testing or medication changes. Please copy this note to chart.   J BrancH MD

## 2016-05-27 ENCOUNTER — Ambulatory Visit (INDEPENDENT_AMBULATORY_CARE_PROVIDER_SITE_OTHER): Payer: Medicare Other | Admitting: Pediatrics

## 2016-05-27 ENCOUNTER — Ambulatory Visit (INDEPENDENT_AMBULATORY_CARE_PROVIDER_SITE_OTHER): Payer: Medicare Other

## 2016-05-27 ENCOUNTER — Encounter: Payer: Self-pay | Admitting: Pediatrics

## 2016-05-27 VITALS — BP 132/62 | HR 82 | Temp 97.2°F | Ht 66.0 in | Wt 168.0 lb

## 2016-05-27 DIAGNOSIS — R0781 Pleurodynia: Secondary | ICD-10-CM

## 2016-05-27 DIAGNOSIS — N19 Unspecified kidney failure: Secondary | ICD-10-CM

## 2016-05-27 DIAGNOSIS — I480 Paroxysmal atrial fibrillation: Secondary | ICD-10-CM | POA: Diagnosis not present

## 2016-05-27 DIAGNOSIS — I1310 Hypertensive heart and chronic kidney disease without heart failure, with stage 1 through stage 4 chronic kidney disease, or unspecified chronic kidney disease: Secondary | ICD-10-CM

## 2016-05-27 MED ORDER — TROLAMINE SALICYLATE 10 % EX LOTN: 1.0000 "application " | TOPICAL_LOTION | Freq: Two times a day (BID) | CUTANEOUS | 0 refills | Status: DC | PRN

## 2016-05-27 NOTE — Progress Notes (Addendum)
    Subjective:    Patient ID: [name], male    DOB: 18-Jan-1926, 82 y.o.   MRN: [id number]  CC: rib Pain (1 day)   HPI: [name] is a 82 y.o. male presenting for rib Pain (1 day)  Was getting up two days ago from sitting to standing, says he felt somethin gpop and since then has had L rib/flank pain Hurts when he gets up Hurts when he moves certain ways Sometimes just hurts when he is sitting still hasnt taken anything for the pain No coughing No fevers Does not hurt when he takes a deep breath  Relevant past medical, surgical, family and social history reviewed. Interim medical history since our last visit reviewed. Allergies and medications reviewed and updated.  History  Smoking Status  . Never Smoker  Smokeless Tobacco  . Never Used    ROS: Per HPI      Objective:    BP 132/62   Pulse 82   Temp 97.2 F (36.2 C) (Oral)   Ht 5\' 6"  (1.676 m)   Wt 168 lb (76.2 kg)   BMI 27.12 kg/m   Wt Readings from Last 3 Encounters:  05/27/16 168 lb (76.2 kg)  05/08/16 159 lb (72.1 kg)  05/03/16 163 lb 9.6 oz (74.2 kg)     Gen: NAD, alert, cooperative with exam, NCAT EYES: EOMI, no conjunctival injection, or no icterus ENT:  TMs pearly gray b/l, OP without erythema LYMPH: no cervical LAD CV: normal rate, irregular rhythm, systolic ejection murmur Resp: CTABL, no wheezes, normal WOB Ext: No edema, warm Neuro: Alert and oriented MSK: No tenderness over spine or paraspinal muscles b/l, not tender over ribs anywhere on the L side. TTP in soft tissue mid scapular between lower ribs, 9-10. No chest wall pain with deep breaths  Preliminary read by Assunta Found, MD: no obvious fractures in area that pt is tender      Assessment & Plan:  [name] was seen today for rib pain.   Diagnoses and all orders for this visit:  Rib pain on left side Soft tissue tenderness No pain over ribs or spine or with deep breath Likely muscle strain aspercreme as needed Cont to  stay active as able -     DG Ribs Unilateral W/Chest Left; Future -     Trolamine Salicylate (ASPERCREME) 10 % LOTN; Apply 1 application topically 2 (two) times daily as needed.  Hypertensive heart and renal disease with renal failure Well controlled BP today Cont meds  Paroxysmal atrial fibrillation (HCC) Irregular rhythm, normal rate   Follow up plan: Return if symptoms worsen or fail to improve.  Assunta Found, MD [facility] 05/27/2016, 10:00 AM  ADDENDUM: Final read xray: IMPRESSION: Nondisplaced left ninth rib fracture is noted. This is most likely old. No acute bony abnormalities otherwise noted .  Possible that this in contributing to pain.  Pt to try aspercreme as above.

## 2016-05-29 ENCOUNTER — Ambulatory Visit (INDEPENDENT_AMBULATORY_CARE_PROVIDER_SITE_OTHER): Payer: Medicare Other | Admitting: Pharmacist

## 2016-05-29 DIAGNOSIS — R350 Frequency of micturition: Secondary | ICD-10-CM

## 2016-05-29 DIAGNOSIS — I480 Paroxysmal atrial fibrillation: Secondary | ICD-10-CM

## 2016-05-29 LAB — URINALYSIS, COMPLETE
BILIRUBIN UA: NEGATIVE
Glucose, UA: NEGATIVE
Ketones, UA: NEGATIVE
LEUKOCYTES UA: NEGATIVE
Nitrite, UA: NEGATIVE
PH UA: 6.5 (ref 5.0–7.5)
PROTEIN UA: NEGATIVE
SPEC GRAV UA: 1.015 (ref 1.005–1.030)
Urobilinogen, Ur: 0.2 mg/dL (ref 0.2–1.0)

## 2016-05-29 LAB — COAGUCHEK XS/INR WAIVED
INR: 2.6 — AB (ref 0.9–1.1)
Prothrombin Time: 31.3 s

## 2016-05-29 LAB — MICROSCOPIC EXAMINATION
EPITHELIAL CELLS (NON RENAL): NONE SEEN /HPF (ref 0–10)
RBC, UA: NONE SEEN /hpf (ref 0–?)

## 2016-05-29 MED ORDER — GABAPENTIN 600 MG PO TABS: ORAL_TABLET | ORAL | 2 refills | Status: DC

## 2016-05-30 ENCOUNTER — Other Ambulatory Visit: Payer: Medicare Other

## 2016-05-30 ENCOUNTER — Other Ambulatory Visit: Payer: Self-pay | Admitting: Pharmacist

## 2016-05-30 DIAGNOSIS — R350 Frequency of micturition: Secondary | ICD-10-CM | POA: Diagnosis not present

## 2016-05-30 NOTE — Addendum Note (Signed)
Addended by: Pollyann Kennedy F on: 05/30/2016 03:03 PM   Modules accepted: Orders

## 2016-06-01 LAB — URINE CULTURE

## 2016-06-04 ENCOUNTER — Telehealth: Payer: Self-pay | Admitting: Family Medicine

## 2016-06-05 NOTE — Telephone Encounter (Signed)
Patient reports that his rib pain is better. He also has appt with ortho 06/13/16 in [place], [place] He still c/o frequent urination during the night.  He would like appt with provider on same day as ortho appt . Appt made.

## 2016-06-13 ENCOUNTER — Ambulatory Visit (INDEPENDENT_AMBULATORY_CARE_PROVIDER_SITE_OTHER): Payer: Medicare Other | Admitting: Pediatrics

## 2016-06-13 ENCOUNTER — Encounter: Payer: Self-pay | Admitting: Pediatrics

## 2016-06-13 VITALS — BP 152/66 | HR 55 | Temp 97.7°F | Ht 66.0 in | Wt 166.0 lb

## 2016-06-13 DIAGNOSIS — N400 Enlarged prostate without lower urinary tract symptoms: Secondary | ICD-10-CM

## 2016-06-13 DIAGNOSIS — R0781 Pleurodynia: Secondary | ICD-10-CM | POA: Diagnosis not present

## 2016-06-13 DIAGNOSIS — M170 Bilateral primary osteoarthritis of knee: Secondary | ICD-10-CM | POA: Diagnosis not present

## 2016-06-13 MED ORDER — TAMSULOSIN HCL 0.4 MG PO CAPS: 0.4000 mg | ORAL_CAPSULE | Freq: Every day | ORAL | 3 refills | Status: DC

## 2016-06-13 NOTE — Progress Notes (Signed)
  Subjective:   Patient ID: [name], male    DOB: Mar 08, 1926, 82 y.o.   MRN: [id number] CC: Follow-up (ribs)  HPI: [name] is a 82 y.o. male presenting for Follow-up (ribs)  Nocturia for past weeks to months Bothering him a lot Sometimes with frequency during the day, going every hour No dysuria No urgency Sometimes has trouble getting urine stream started Does feel like he empties his bladder completely No fevers or abd pain Normal appetite Ribs feel much better No pain with deep breathing Seen by ortho today, had knee injections b/l, pleased, feeling more mobile now   Relevant past medical, surgical, family and social history reviewed. Allergies and medications reviewed and updated. History  Smoking Status  . Never Smoker  Smokeless Tobacco  . Never Used   ROS: Per HPI   Objective:    BP (!) 152/66   Pulse (!) 55   Temp 97.7 F (36.5 C) (Oral)   Ht 5\' 6"  (1.676 m)   Wt 166 lb (75.3 kg)   SpO2 100%   BMI 26.79 kg/m   Wt Readings from Last 3 Encounters:  06/13/16 166 lb (75.3 kg)  05/27/16 168 lb (76.2 kg)  05/08/16 159 lb (72.1 kg)    Gen: NAD, alert, cooperative with exam, NCAT EYES: EOMI, no conjunctival injection, or no icterus CV: bradycardic, normal S1/S2 Resp: CTABL, no wheezes, normal WOB Abd: +BS, soft, NTND. No CVA tenderness Ext: No edema, warm Neuro: Alert and oriented, strength equal b/l UE and LE, coordination grossly normal MSK: no pain with palpation rib cage b/l  Assessment & Plan:  Min[place] was seen today for follow-up rib pain.  Diagnoses and all orders for this visit: Nocturia Likely BPH Taking lasix in the morning Will check UA Trial of flomax Discussed s/e, will let me know if any worsening  BPH (benign prostatic hyperplasia) -     tamsulosin (FLOMAX) 0.4 MG CAPS capsule; Take 1 capsule (0.4 mg total) by mouth daily.  Rib pain Much improved  Follow up plan: As needed Assunta Found, MD [place]

## 2016-06-25 NOTE — Progress Notes (Signed)
HPI The patient presents for one-year followup of his known coronary disease.   He was in the hospital in June with acute on chronic systolic and diastolic HF.  EF was 35 - 40%.  He was diuresied.  He was in the ED in late July with PVCs.  He followed up once in the office with Dr. Harl Bowie after that.   He had a monitor placed with frequent PVCs.  He returns for follow up.    He actually lives alone and says he's feeling well. He feeds to cats. He denies any cardiovascular symptoms. In particular he doesn't notice palpitations. He doesn't have presyncope or syncope. He has no chest pressure, neck or arm discomfort. When I looked at the emergency room reportedly was actually there because of some diffuse body aches and wasn't complaining of anything cardiac.  I reviewed the Holter and ED records.    Allergies  Allergen Reactions  . Amiodarone     Neuropathy  . Penicillins Itching    Has patient had a PCN reaction causing immediate rash, facial/tongue/throat swelling, SOB or lightheadedness with hypotension: Yesunknown Has patient had a PCN reaction causing severe rash involving mucus membranes or skin necrosis: unknown Has patient had a PCN reaction that required hospitalization Yesno  If all of the above answers are "NO", then may proceed with Ceph    Current Outpatient Prescriptions  Medication Sig Dispense Refill  . acetaminophen (TYLENOL) 500 MG tablet Take 1-2 tablets (500-1,000 mg total) by mouth 2 (two) times daily as needed. 30 tablet 0  . amLODipine (NORVASC) 10 MG tablet Take 1 tablet (10 mg total) by mouth daily. KEEP OV. 30 tablet 1  . CALCIUM PO Take 630 mg by mouth daily. Each tablet is 315mg  - take 2 tablets once daily in the evening    . carvedilol (COREG) 12.5 MG tablet TAKE (1) TABLET TWICE A DAY. 60 tablet 5  . cholecalciferol (VITAMIN D) 1000 units tablet Take 1,000 Units by mouth daily. Helps with balance and bone health    . fenofibrate micronized (LOFIBRA) 134 MG  capsule Take 134 mg by mouth daily before breakfast.    . fish oil-omega-3 fatty acids 1000 MG capsule Take 1,200 mg by mouth daily.     . furosemide (LASIX) 20 MG tablet Take 1 tablet (20 mg total) by mouth every morning. Note change in dose from 2/day to 1/day (profile) 30 tablet 2  . gabapentin (NEURONTIN) 600 MG tablet Take 1/2 tablet (=300mg ) 3 times a day for 1 week.  Then decrease to 1/2 tablet 2 times a day for 1 week.  Then decrease to 1/2 tablet at bedtime. 90 tablet 2  . levothyroxine (SYNTHROID, LEVOTHROID) 125 MCG tablet Take 1 tablet (125 mcg total) by mouth daily. 30 tablet 2  . NITROSTAT 0.4 MG SL tablet Place 1 tablet (0.4 mg total) under the tongue every 5 (five) minutesas needed for chest pain. 25 tablet 0  . tamsulosin (FLOMAX) 0.4 MG CAPS capsule Take 1 capsule (0.4 mg total) by mouth daily. 30 capsule 3  . traZODone (DESYREL) 150 MG tablet Use from 1/3 to 1 tablet nightly as needed for sleep. 30 tablet 5  . warfarin (COUMADIN) 5 MG tablet Take 1 tablet (5 mg total) by mouth daily. 32 tablet 2  . Trolamine Salicylate (ASPERCREME) 10 % LOTN Apply 1 application topically 2 (two) times daily as needed. 141 g 0   No current facility-administered medications for this visit.  Past Medical History:  Diagnosis Date  . Atrial fibrillation (HCC)    Persistent, treated with amiodarone and cardioversion  . CAD (coronary artery disease)    Last catheterization in August 2008 demonstrated the LAD 40% stenosis  . COPD (chronic obstructive pulmonary disease) ([place])   . Dilated cardiomyopathy ([place])    Felt to be secondary to atrial fibrillation. EF is about 40%  . Drug therapy    Chronic Coumadin therapy  . Dyslipidemia   . Hypertension   . Hypothyroidism    Possibly related to amiodarone    Past Surgical History:  Procedure Laterality Date  . CARDIAC CATHETERIZATION  05/2007   LAD 40% stenosis. Stent in the LAD had 20% in-stent restenosis. Followed by 40-50% distal stenosis.  50% diagonal stenosis. 40-50% circumflex stenosis. 70% OM1 stenosis and 30% ostial RCA stenosis. 30% proximal RCA stenosis.  . CORONARY STENT PLACEMENT    . HIP SURGERY Left    82yo    ROS:  Neuropathy .  Otherwise as stated in the HPI and negative for all other systems.  PHYSICAL EXAM BP (!) 130/50   Pulse (!) 30   Ht 5\' 6"  (1.676 m)   Wt 162 lb (73.5 kg)   BMI 26.15 kg/m  GENERAL:  Well appearing but does appear his stated age.  NECK:  No jugular venous distention, waveform within normal limits, carotid upstroke brisk and symmetric, soft right bruits, no thyromegaly LUNGS:  Clear to auscultation bilaterally CHEST:  Unremarkable HEART:  PMI not displaced or sustained,S1 and S2 within normal limits, no S3, no S4, no clicks, no rubs, apical early peaking systolic murmur radiating out the aortic outflow tract slightly ABD:  Flat, positive bowel sounds normal in frequency in pitch, no bruits, no rebound, no guarding, no midline pulsatile mass, no hepatomegaly, no splenomegaly EXT:  2 plus pulses upper and decreased DP/PT bilateral with right greater than left edema, no cyanosis no clubbing SKIN:  No rashes no nodules, multiple bruises.    EKG:  Sinus bradycardia, left bundle branch, rate premature ventricular contractions in a bigeminal pattern   ASSESSMENT AND PLAN  ATRIAL FIBRILLATION:  Has been off of amiodarone because it might cause neuropathy. I added this is an intolerance. He's not had any new paroxysms. He tolerates anticoagulation. No change in therapy is indicated.  Mr. [name] has a CHA2DS2 - VASc score of 4 with a risk of stroke of 4%.  CAD:   He has had no new symptoms since the [facility]. No further testing is indicated.   HTN:  His blood pressure is at target. No change in therapy is planned.  HYPERLIPIDEMIA:   His last LDL was 75. He did have hypertriglyceridemia. This is followed by Claretta Fraise, MD  CAROTID STENOSIS:  Carotid Doppler demonstrated  stable 40-59% bilateral stenosis.  I will arrange follow-up.  LEG SWELLING:  He should not have a DVT with his warfarin but I will check a venous Doppler.  PVCs:  I reviewed the hospital in Holter reports. He does have bigeminy and his pulse was recorded at 30 which was under counseling the PVCs. He reports he is not having any symptoms related to this.  He did have brief asymptomatic run of NSVT. However, he had no symptomatic sinus bradycardia. Therefore, at this point I think that no further cardiovascular testing is suggested.  CARDIOMYOPATHY:  The patient does have a slightly reduced ejection fraction. However, he is asymptomatic and seems to be euvolemic.  This is not  significantly changed from previous. He hasn't tolerated ACE or ARB in the past because of renal insufficiency. No change in therapy is planned.  Hospital records reviewed.

## 2016-06-26 ENCOUNTER — Encounter: Payer: Self-pay | Admitting: Cardiology

## 2016-06-26 ENCOUNTER — Ambulatory Visit (INDEPENDENT_AMBULATORY_CARE_PROVIDER_SITE_OTHER): Payer: Medicare Other | Admitting: Cardiology

## 2016-06-26 VITALS — BP 130/50 | HR 30 | Ht 66.0 in | Wt 162.0 lb

## 2016-06-26 DIAGNOSIS — I480 Paroxysmal atrial fibrillation: Secondary | ICD-10-CM | POA: Diagnosis not present

## 2016-06-26 DIAGNOSIS — I493 Ventricular premature depolarization: Secondary | ICD-10-CM | POA: Diagnosis not present

## 2016-06-26 NOTE — Patient Instructions (Signed)
Medication Instructions:  The current medical regimen is effective;  continue present plan and medications.  Follow-Up: Follow up in 4 months with Dr. Hochrein.  You will receive a letter in the mail 2 months before you are due.  Please call us when you receive this letter to schedule your follow up appointment.  If you need a refill on your cardiac medications before your next appointment, please call your pharmacy.  Thank you for choosing [facility]!!     

## 2016-06-27 ENCOUNTER — Encounter: Payer: Self-pay | Admitting: Pharmacist

## 2016-07-01 ENCOUNTER — Telehealth: Payer: Self-pay | Admitting: Family Medicine

## 2016-07-02 ENCOUNTER — Ambulatory Visit (INDEPENDENT_AMBULATORY_CARE_PROVIDER_SITE_OTHER): Payer: Medicare Other | Admitting: Family Medicine

## 2016-07-02 ENCOUNTER — Ambulatory Visit (INDEPENDENT_AMBULATORY_CARE_PROVIDER_SITE_OTHER): Payer: Medicare Other

## 2016-07-02 ENCOUNTER — Encounter: Payer: Self-pay | Admitting: Family Medicine

## 2016-07-02 VITALS — BP 109/74 | HR 67 | Temp 97.0°F | Ht 66.0 in | Wt 160.6 lb

## 2016-07-02 DIAGNOSIS — R05 Cough: Secondary | ICD-10-CM

## 2016-07-02 DIAGNOSIS — R059 Cough, unspecified: Secondary | ICD-10-CM

## 2016-07-02 DIAGNOSIS — N400 Enlarged prostate without lower urinary tract symptoms: Secondary | ICD-10-CM | POA: Diagnosis not present

## 2016-07-02 DIAGNOSIS — R350 Frequency of micturition: Secondary | ICD-10-CM

## 2016-07-02 LAB — URINALYSIS, COMPLETE
BILIRUBIN UA: NEGATIVE
GLUCOSE, UA: NEGATIVE
KETONES UA: NEGATIVE
Leukocytes, UA: NEGATIVE
NITRITE UA: NEGATIVE
Protein, UA: NEGATIVE
SPEC GRAV UA: 1.015 (ref 1.005–1.030)
UUROB: 0.2 mg/dL (ref 0.2–1.0)
pH, UA: 5.5 (ref 5.0–7.5)

## 2016-07-02 LAB — MICROSCOPIC EXAMINATION: Bacteria, UA: NONE SEEN

## 2016-07-02 MED ORDER — FINASTERIDE 5 MG PO TABS: 5.0000 mg | ORAL_TABLET | Freq: Every day | ORAL | 5 refills | Status: DC

## 2016-07-02 MED ORDER — TAMSULOSIN HCL 0.4 MG PO CAPS: 0.8000 mg | ORAL_CAPSULE | Freq: Every day | ORAL | 5 refills | Status: DC

## 2016-07-02 NOTE — Patient Instructions (Signed)
Great to meet you!  For now inxcrease your flomax to 2 pills once a day Start finasteride, remember this will take a few months to make a difference ut eventually it will cause your prostate to shrink.

## 2016-07-02 NOTE — Telephone Encounter (Signed)
Appointment made to see Dr. Wendi Snipes 09/19 at 2:55 for dyspnea.  Patient verbalized understanding.

## 2016-07-02 NOTE — Progress Notes (Signed)
   HPI  Patient presents today here with cough and urinary frequency.  Cough States he had one week of cough and increased shortness of breath. He's been taking over-the-counter cough medication with a little bit of improvement. No increased leg swelling or significant orthopnea. He's taking his diuretic like usual, every morning. Also complains of weakness, Denies chest pain, denies any exertional symptoms  Urinary frequency As been gone on for about 3 months, is more worrisome at nighttime when he has more than 5 episodes of nocturia per night. He has mild symptoms of weak stream and frequent symptoms of urinary frequency and urgency. His IP SS score is 16.  PMH: Smoking status noted ROS: Per HPI  Objective: BP 109/74 (BP Location: Right Arm, Patient Position: Sitting, Cuff Size: Normal)   Pulse 67   Temp 97 F (36.1 C) (Oral)   Ht 5\' 6"  (1.676 m)   Wt 160 lb 9.6 oz (72.8 kg)   BMI 25.92 kg/m  Gen: NAD, alert, cooperative with exam HEENT: NCAT CV: RRR, good S1/S2, no murmur Resp: CTABL, no wheezes, non-labored Ext: No edema, warm Neuro: Alert and oriented, No gross deficits  CXR - atherosclerosis, no acute infiltrate or effusion  Assessment and plan:  # Cough Lung exam is reassuring, chest x-ray pending He appears to be euvolemic, with no leg edema and steadily decreasing weight trends. Reports a good diuresis with Lasix Likely viral respiratory illness, however given his age and comorbidities I have recommended a very low threshold for return if he is worsening or not improving as expected   # BPH His symptoms sound most consistent with BPH His IPSS score is uncontrolled at 16 Increase Flomax to 0.8 mg daily, adding finasteride.   Vitals with Age-Percentiles 07/02/2016 06/26/2016 06/13/2016 05/27/2016  Weight 72.848 kg 73.483 kg 75.297 kg 76.204 kg      Meds ordered this encounter  Medications  . tamsulosin (FLOMAX) 0.4 MG CAPS capsule    Sig: Take 2  capsules (0.8 mg total) by mouth daily.    Dispense:  60 capsule    Refill:  5  . finasteride (PROSCAR) 5 MG tablet    Sig: Take 1 tablet (5 mg total) by mouth daily.    Dispense:  30 tablet    Refill:  [place], MD [facility] 07/02/2016, 3:05 PM

## 2016-07-02 NOTE — Addendum Note (Signed)
Addended by: Nigel Berthold C on: 07/02/2016 03:41 PM   Modules accepted: Orders

## 2016-07-03 ENCOUNTER — Encounter (HOSPITAL_COMMUNITY): Payer: Self-pay

## 2016-07-03 ENCOUNTER — Emergency Department (HOSPITAL_COMMUNITY): Payer: Medicare Other

## 2016-07-03 ENCOUNTER — Emergency Department (HOSPITAL_COMMUNITY)
Admission: EM | Admit: 2016-07-03 | Discharge: 2016-07-03 | Disposition: A | Discharge status: Home / Self Care | Payer: Medicare Other | Attending: Emergency Medicine | Admitting: Emergency Medicine

## 2016-07-03 DIAGNOSIS — Z79899 Other long term (current) drug therapy: Secondary | ICD-10-CM | POA: Insufficient documentation

## 2016-07-03 DIAGNOSIS — R0602 Shortness of breath: Secondary | ICD-10-CM | POA: Diagnosis not present

## 2016-07-03 DIAGNOSIS — I5043 Acute on chronic combined systolic (congestive) and diastolic (congestive) heart failure: Secondary | ICD-10-CM | POA: Insufficient documentation

## 2016-07-03 DIAGNOSIS — E039 Hypothyroidism, unspecified: Secondary | ICD-10-CM | POA: Insufficient documentation

## 2016-07-03 DIAGNOSIS — I2510 Atherosclerotic heart disease of native coronary artery without angina pectoris: Secondary | ICD-10-CM | POA: Insufficient documentation

## 2016-07-03 DIAGNOSIS — I110 Hypertensive heart disease with heart failure: Secondary | ICD-10-CM | POA: Diagnosis not present

## 2016-07-03 DIAGNOSIS — R531 Weakness: Secondary | ICD-10-CM | POA: Diagnosis not present

## 2016-07-03 DIAGNOSIS — J449 Chronic obstructive pulmonary disease, unspecified: Secondary | ICD-10-CM | POA: Insufficient documentation

## 2016-07-03 LAB — BASIC METABOLIC PANEL
ANION GAP: 8 (ref 5–15)
BUN: 31 mg/dL — ABNORMAL HIGH (ref 6–20)
CHLORIDE: 99 mmol/L — AB (ref 101–111)
CO2: 27 mmol/L (ref 22–32)
Calcium: 9 mg/dL (ref 8.9–10.3)
Creatinine, Ser: 1.21 mg/dL (ref 0.61–1.24)
GFR calc non Af Amer: 52 mL/min — ABNORMAL LOW (ref >60)
GFR, EST AFRICAN AMERICAN: 60 mL/min — AB (ref >60)
Glucose, Bld: 162 mg/dL — ABNORMAL HIGH (ref 65–99)
POTASSIUM: 3.9 mmol/L (ref 3.5–5.1)
SODIUM: 134 mmol/L — AB (ref 135–145)

## 2016-07-03 LAB — URINALYSIS, ROUTINE W REFLEX MICROSCOPIC
Bilirubin Urine: NEGATIVE
Glucose, UA: NEGATIVE mg/dL
Ketones, ur: NEGATIVE mg/dL
LEUKOCYTES UA: NEGATIVE
NITRITE: NEGATIVE
Protein, ur: NEGATIVE mg/dL
SPECIFIC GRAVITY, URINE: 1.025 (ref 1.005–1.030)
pH: 5.5 (ref 5.0–8.0)

## 2016-07-03 LAB — PROTIME-INR
INR: 2.32
Prothrombin Time: 25.9 seconds — ABNORMAL HIGH (ref 11.4–15.2)

## 2016-07-03 LAB — CBC WITH DIFFERENTIAL/PLATELET
Basophils Absolute: 0 10*3/uL (ref 0.0–0.1)
Basophils Relative: 0 %
EOS ABS: 0.2 10*3/uL (ref 0.0–0.7)
Eosinophils Relative: 3 %
HEMATOCRIT: 44 % (ref 39.0–52.0)
HEMOGLOBIN: 14 g/dL (ref 13.0–17.0)
LYMPHS ABS: 1.2 10*3/uL (ref 0.7–4.0)
Lymphocytes Relative: 18 %
MCH: 24 pg — AB (ref 26.0–34.0)
MCHC: 31.8 g/dL (ref 30.0–36.0)
MCV: 75.3 fL — ABNORMAL LOW (ref 78.0–100.0)
MONO ABS: 0.4 10*3/uL (ref 0.1–1.0)
MONOS PCT: 6 %
NEUTROS PCT: 73 %
Neutro Abs: 4.8 10*3/uL (ref 1.7–7.7)
Platelets: 202 10*3/uL (ref 150–400)
RBC: 5.84 MIL/uL — ABNORMAL HIGH (ref 4.22–5.81)
RDW: 20 % — ABNORMAL HIGH (ref 11.5–15.5)
WBC: 6.6 10*3/uL (ref 4.0–10.5)

## 2016-07-03 LAB — URINE MICROSCOPIC-ADD ON

## 2016-07-03 LAB — TROPONIN I

## 2016-07-03 MED ORDER — DIPHENHYDRAMINE HCL 50 MG/ML IJ SOLN: 25.0000 mg | Freq: Once | INTRAMUSCULAR | Status: DC

## 2016-07-03 MED ORDER — SODIUM CHLORIDE 0.9 % IV BOLUS (SEPSIS)
1000.0000 mL | Freq: Once | INTRAVENOUS | Status: AC
  Administered 2016-07-03: 1000 mL via INTRAVENOUS

## 2016-07-03 MED ORDER — KETOROLAC TROMETHAMINE 30 MG/ML IJ SOLN: 30.0000 mg | Freq: Once | INTRAMUSCULAR | Status: DC

## 2016-07-03 MED ORDER — METOCLOPRAMIDE HCL 5 MG/ML IJ SOLN: 10.0000 mg | Freq: Once | INTRAMUSCULAR | Status: DC

## 2016-07-03 MED ORDER — DEXAMETHASONE SODIUM PHOSPHATE 10 MG/ML IJ SOLN: 10.0000 mg | Freq: Once | INTRAMUSCULAR | Status: DC

## 2016-07-03 MED ORDER — IOPAMIDOL (ISOVUE-370) INJECTION 76%
100.0000 mL | Freq: Once | INTRAVENOUS | Status: AC | PRN
  Administered 2016-07-03: 100 mL via INTRAVENOUS

## 2016-07-03 NOTE — ED Triage Notes (Signed)
Pt started new medication yesteraday, Finasteride and Tamsulosin. Pt reports this am he has generalized weakness all over. Denies any pain. C/o periods of sob as well.

## 2016-07-03 NOTE — ED Notes (Signed)
Inez Catalina, sister, [phone]

## 2016-07-03 NOTE — Discharge Instructions (Signed)
You were checked for possible clot in your lungs, however your imaging was negative.Your blood work was all within normal limits. Please try to keep yourself hydrated and eat as much as you can. You should follow up with PCP later this week.

## 2016-07-03 NOTE — ED Provider Notes (Signed)
[facility] DEPT Provider Note   CSN: [id number] Arrival date & time: 07/03/16  1129     History   Chief Complaint Chief Complaint  Patient presents with  . Weakness    HPI  [name] is a 82 y.o. male with pmhx significant for COPD, CAD, HrEF, A-fib, HTN, Hypothyroidism presenting with weakness in bilateral legs this morning. Patient states he got up this morning and didn't have the strength to walk. He feels like he has weakness generally everywhere. He was recently seen by his PCP who started him on a second medication for his BPH and increased his finasteride. Indicates that he is always has been hoarse over the last 2 days, ever denies any significant coughing or congestion. Patient denies any chest pain shortness of breath. Denies any numbness, dizziness, or headaches. He denies any recent weight loss, fever or chills.  HPI  Past Medical History:  Diagnosis Date  . Atrial fibrillation (HCC)    Persistent, treated with amiodarone and cardioversion  . CAD (coronary artery disease)    Last catheterization in August 2008 demonstrated the LAD 40% stenosis  . COPD (chronic obstructive pulmonary disease) ([place])   . Dilated cardiomyopathy ([place])    Felt to be secondary to atrial fibrillation. EF is about 40%  . Drug therapy    Chronic Coumadin therapy  . Dyslipidemia   . Hypertension   . Hypothyroidism    Possibly related to amiodarone    Patient Active Problem List   Diagnosis Date Noted  . Acute kidney injury superimposed on CKD ([place]) 04/09/2016  . Acute on chronic combined systolic and diastolic CHF (congestive heart failure) ([place]) 04/01/2016  . Insomnia 03/06/2016  . Dyslipidemia 12/26/2015  . Fecal soiling due to fecal incontinence 09/12/2015  . BPH (benign prostatic hyperplasia) 08/02/2015  . Arthritis, hip 08/02/2015  . At high risk for falls 07/14/2015  . Hereditary and idiopathic peripheral neuropathy 03/06/2015  . Pre-diabetes 12/23/2014  . Hypothyroid  11/18/2013  . Right sided weakness 10/05/2013  . Long term current use of anticoagulant therapy 10/05/2013  . Paresthesias 10/05/2013  . Paroxysmal atrial fibrillation ([place]) 01/25/2013  . Tingling 11/20/2011  . Bruit 11/20/2011  . Coronary atherosclerosis 12/12/2010  . Hypertensive heart and renal disease with renal failure 01/05/2009  . CARDIOMYOPATHY 01/05/2009    Past Surgical History:  Procedure Laterality Date  . CARDIAC CATHETERIZATION  05/2007   LAD 40% stenosis. Stent in the LAD had 20% in-stent restenosis. Followed by 40-50% distal stenosis. 50% diagonal stenosis. 40-50% circumflex stenosis. 70% OM1 stenosis and 30% ostial RCA stenosis. 30% proximal RCA stenosis.  . CORONARY STENT PLACEMENT    . HIP SURGERY Left    82yo       Home Medications    Prior to Admission medications   Medication Sig Start Date End Date Taking? Authorizing Provider  acetaminophen (TYLENOL) 500 MG tablet Take 1-2 tablets (500-1,000 mg total) by mouth 2 (two) times daily as needed. 03/28/16   Cherre Robins, PharmD  amLODipine (NORVASC) 10 MG tablet Take 1 tablet (10 mg total) by mouth daily. KEEP OV. 05/13/16   Lelon Perla, MD  CALCIUM PO Take 630 mg by mouth daily. Each tablet is '315mg'$  - take 2 tablets once daily in the evening    Historical Provider, MD  carvedilol (COREG) 12.5 MG tablet TAKE (1) TABLET TWICE A DAY. 05/02/16   Claretta Fraise, MD  cholecalciferol (VITAMIN D) 1000 units tablet Take 1,000 Units by mouth daily. Helps with balance and  bone health    Historical Provider, MD  fenofibrate micronized (LOFIBRA) 134 MG capsule Take 134 mg by mouth daily before breakfast.    Historical Provider, MD  finasteride (PROSCAR) 5 MG tablet Take 1 tablet (5 mg total) by mouth daily. 07/02/16   Timmothy Euler, MD  fish oil-omega-3 fatty acids 1000 MG capsule Take 1,200 mg by mouth daily.     Historical Provider, MD  furosemide (LASIX) 20 MG tablet Take 1 tablet (20 mg total) by mouth every morning.  Note change in dose from 2/day to 1/day (profile) 04/30/16   Cherre Robins, PharmD  gabapentin (NEURONTIN) 600 MG tablet Take 1/2 tablet (='300mg'$ ) 3 times a day for 1 week.  Then decrease to 1/2 tablet 2 times a day for 1 week.  Then decrease to 1/2 tablet at bedtime. 05/29/16   Chipper Herb, MD  levothyroxine (SYNTHROID, LEVOTHROID) 125 MCG tablet Take 1 tablet (125 mcg total) by mouth daily. 05/03/16   Claretta Fraise, MD  NITROSTAT 0.4 MG SL tablet Place 1 tablet (0.4 mg total) under the tongue every 5 (five) minutesas needed for chest pain. 11/14/15   Claretta Fraise, MD  tamsulosin (FLOMAX) 0.4 MG CAPS capsule Take 2 capsules (0.8 mg total) by mouth daily. 07/02/16   Timmothy Euler, MD  traZODone (DESYREL) 150 MG tablet Use from 1/3 to 1 tablet nightly as needed for sleep. 05/03/16   Claretta Fraise, MD  Trolamine Salicylate (ASPERCREME) 10 % LOTN Apply 1 application topically 2 (two) times daily as needed. 05/27/16   Eustaquio Maize, MD  warfarin (COUMADIN) 5 MG tablet Take 1 tablet (5 mg total) by mouth daily. 05/15/16   Cherre Robins, PharmD    Family History Family History  Problem Relation Age of Onset  . Adopted: Yes  . Heart attack Mother   . Heart attack Father   . Diabetes Other   . Stroke Other     Social History Social History  Substance Use Topics  . Smoking status: Never Smoker  . Smokeless tobacco: Never Used  . Alcohol use No     Comment: quit at age 74yo     Allergies   Amiodarone and Penicillins   Review of Systems Review of Systems  Constitutional: Negative for chills, fever and unexpected weight change.  HENT: Negative for congestion.   Eyes: Negative for pain and visual disturbance.  Respiratory: Positive for shortness of breath. Negative for chest tightness.   Cardiovascular: Negative for chest pain and leg swelling.  Gastrointestinal: Negative for abdominal distention, abdominal pain, nausea and vomiting.  Genitourinary: Negative for dysuria and flank pain.    Musculoskeletal: Negative for arthralgias and myalgias.  Neurological: Negative for dizziness and headaches.  Psychiatric/Behavioral: Negative for agitation and dysphoric mood.     Physical Exam Updated Vital Signs BP 154/68   Pulse 72   Temp 97.6 F (36.4 C) (Oral)   Resp 24   Ht '5\' 6"'$  (1.676 m)   Wt 72.8 kg   SpO2 99%   BMI 25.92 kg/m   Physical Exam  Constitutional: He is oriented to person, place, and time.  Elderly thin gentleman   HENT:  Head: Normocephalic and atraumatic.  Right Ear: External ear normal.  Left Ear: External ear normal.  Mouth/Throat: Oropharynx is clear and moist.  Eyes: Conjunctivae and EOM are normal. Pupils are equal, round, and reactive to light.  Neck: Normal range of motion. Neck supple.  Cardiovascular: Normal heart sounds and intact distal pulses.  An  irregular rhythm present.  Pulmonary/Chest: Effort normal and breath sounds normal.  Abdominal: Soft. Bowel sounds are normal.  Musculoskeletal: Normal range of motion.  Neurological: He is alert and oriented to person, place, and time. No cranial nerve deficit. He exhibits normal muscle tone. Coordination normal.  Skin: Skin is warm and dry.  Psychiatric: He has a normal mood and affect.     ED Treatments / Results  Labs (all labs ordered are listed, but only abnormal results are displayed) Labs Reviewed  CBC WITH DIFFERENTIAL/PLATELET - Abnormal; Notable for the following:       Result Value   RBC 5.84 (*)    MCV 75.3 (*)    MCH 24.0 (*)    RDW 20.0 (*)    All other components within normal limits  URINALYSIS, ROUTINE W REFLEX MICROSCOPIC (NOT AT [facility]) - Abnormal; Notable for the following:    Hgb urine dipstick TRACE (*)    All other components within normal limits  URINE MICROSCOPIC-ADD ON - Abnormal; Notable for the following:    Squamous Epithelial / LPF 0-5 (*)    Bacteria, UA RARE (*)    All other components within normal limits  BASIC METABOLIC PANEL  TROPONIN I   PROTIME-INR    EKG  EKG Interpretation  Date/Time:  Wednesday July 03 2016 12:19:22 EDT Ventricular Rate:  94 PR Interval:    QRS Duration: 144 QT Interval:  408 QTC Calculation: 510 R Axis:   -17 Text Interpretation: Critical Test Result: Arrhythmia Undetermined rhythm Left bundle branch block Abnormal ECG likely Afib w/ LBBB and bigeminy seems similar to previous on 7/19 Confirmed by [facility] MD, JASON [phone]) on 07/03/2016 12:46:21 PM       Radiology Dg Chest 2 View  Result Date: 07/03/2016 CLINICAL DATA:  Weakness EXAM: CHEST  2 VIEW COMPARISON:  07/02/2016 FINDINGS: Heart size and vascularity normal. Atherosclerotic calcification aortic arch. Negative for heart failure. Lungs remain clear without infiltrate or effusion. Negative for mass lesion. IMPRESSION: No active cardiopulmonary disease. Electronically Signed   By: Franchot Gallo M.D.   On: 07/03/2016 12:42   Dg Chest 2 View  Result Date: 07/02/2016 CLINICAL DATA:  Congestion, cough EXAM: CHEST  2 VIEW COMPARISON:  05/27/2016 FINDINGS: Heart and mediastinal contours are within normal limits. No focal opacities or effusions. No acute bony abnormality. IMPRESSION: No active cardiopulmonary disease. Electronically Signed   By: Rolm Baptise M.D.   On: 07/02/2016 15:47    Procedures Procedures (including critical care time)  Medications Ordered in ED Medications - No data to display   Initial Impression / Assessment and Plan / ED Course  I have reviewed the triage vital signs and the nursing notes.  Pertinent labs & imaging results that were available during my care of the patient were reviewed by me and considered in my medical decision making (see chart for details).  Clinical Course  Value Comment By Time  EGFR Laqueta Jean.): (!) 75 (Reviewed) Lakelyn Straus Cletis Media, MD 09/20 1531   Patient presenting with generalized weakness and some shortness of breath. CT angios negative for PE or pneumonia. No concerns  on labs or patient's weakness. No focal neurological defects on exam. EKG was noted to have a left bundle-branch block and bigeminy, similar to previous EKGs and troponin was negative. No concern for cardiac issues. Patient has been eating and drinking less. Therefore provided a bolus. No signs of fluid overload on exam or with imaging. Patient improved at end of visit and felt  like his weakness/SOB had resolved and was ready for discharge to home.   Final Clinical Impressions(s) / ED Diagnoses   Final diagnoses:  None    New Prescriptions New Prescriptions   No medications on file     Ryder Man Cletis Media, MD 07/03/16 1846    Merrily Pew, MD 07/03/16 2142

## 2016-07-03 NOTE — ED Notes (Signed)
ekg given to Dr Christy Gentles,

## 2016-07-03 NOTE — ED Notes (Signed)
Pt taken to ct 

## 2016-07-05 ENCOUNTER — Ambulatory Visit: Payer: Medicare Other | Admitting: Physician Assistant

## 2016-07-05 ENCOUNTER — Encounter: Payer: Self-pay | Admitting: Family Medicine

## 2016-07-05 ENCOUNTER — Ambulatory Visit (INDEPENDENT_AMBULATORY_CARE_PROVIDER_SITE_OTHER): Payer: Medicare Other | Admitting: Family Medicine

## 2016-07-05 VITALS — BP 136/64 | HR 70 | Temp 96.9°F | Ht 66.0 in | Wt 162.5 lb

## 2016-07-05 DIAGNOSIS — I480 Paroxysmal atrial fibrillation: Secondary | ICD-10-CM | POA: Diagnosis not present

## 2016-07-05 DIAGNOSIS — E039 Hypothyroidism, unspecified: Secondary | ICD-10-CM

## 2016-07-05 DIAGNOSIS — I1310 Hypertensive heart and chronic kidney disease without heart failure, with stage 1 through stage 4 chronic kidney disease, or unspecified chronic kidney disease: Secondary | ICD-10-CM

## 2016-07-05 DIAGNOSIS — R0600 Dyspnea, unspecified: Secondary | ICD-10-CM

## 2016-07-05 DIAGNOSIS — Z7901 Long term (current) use of anticoagulants: Secondary | ICD-10-CM

## 2016-07-05 DIAGNOSIS — N19 Unspecified kidney failure: Secondary | ICD-10-CM | POA: Diagnosis not present

## 2016-07-05 MED ORDER — CARVEDILOL 12.5 MG PO TABS: ORAL_TABLET | ORAL | 5 refills | Status: DC

## 2016-07-05 MED ORDER — LEVOFLOXACIN 500 MG PO TABS: 500.0000 mg | ORAL_TABLET | Freq: Every day | ORAL | 0 refills | Status: DC

## 2016-07-05 NOTE — Progress Notes (Signed)
Subjective:  Patient ID: [name], male    DOB: Dec 29, 1925  Age: 82 y.o. MRN: [id number]  CC: Cough (pt here today c/o cough and congestion, he was seen in the ED and he states they couldn't find anything wrong with him. He is also having a lot of congestion.)   HPI [name] presents for profound weakness. Can't do anything. His appetite remains good. He denies shortness of breath. He had a CTPA in the emergency department and there was no blood clot. Chest x-ray was read as normal. Both of these are appended below. He feels the weakness hit him hard 2 days ago before going to the emergency department. He had started on new drugs for BPH the day before. The drugs are tamsulosin and finasteride. He denies any dizziness from these. No headache. He is feeling some pressure in the chest and coughing. He feels like he has some congestion that he needs to get up but nothing will come up. He does have a history of hypertension and diminished ejection fraction with cardiomyopathy. When he was seen by Dr. Percival [name] recently there was no sign of atrial fibrillation although he did have some bigeminy.   History [name] has a past medical history of Atrial fibrillation ([place]); CAD (coronary artery disease); COPD (chronic obstructive pulmonary disease) ([place]); Dilated cardiomyopathy ([place]); Drug therapy; Dyslipidemia; Hypertension; and Hypothyroidism.   He has a past surgical history that includes Hip surgery (Left); Cardiac catheterization (05/2007); and Coronary stent placement.   His family history includes Diabetes in his other; Heart attack in his father and mother; Stroke in his other. He was adopted.He reports that he has never smoked. He has never used smokeless tobacco. He reports that he does not drink alcohol or use drugs.    ROS Review of Systems  Constitutional: Positive for fatigue. Negative for chills, diaphoresis, fever and unexpected weight change.  HENT: Negative for congestion,  hearing loss, rhinorrhea and sore throat.   Eyes: Negative for visual disturbance.  Respiratory: Positive for chest tightness and shortness of breath. Negative for cough.   Cardiovascular: Negative for chest pain.  Gastrointestinal: Negative for abdominal pain, constipation and diarrhea.  Genitourinary: Negative for dysuria and flank pain.  Musculoskeletal: Negative for arthralgias and joint swelling.  Skin: Negative for rash.  Neurological: Positive for weakness (nonfocal). Negative for dizziness and headaches.  Psychiatric/Behavioral: Negative for dysphoric mood and sleep disturbance.    Objective:  BP 136/64   Pulse 70   Temp (!) 96.9 F (36.1 C) (Oral)   Ht 5\' 6"  (1.676 m)   Wt 162 lb 8 oz (73.7 kg)   BMI 26.23 kg/m   BP Readings from Last 3 Encounters:  07/05/16 136/64  07/03/16 144/72  07/02/16 109/74    Wt Readings from Last 3 Encounters:  07/05/16 162 lb 8 oz (73.7 kg)  07/03/16 160 lb 9 oz (72.8 kg)  07/02/16 160 lb 9.6 oz (72.8 kg)     Physical Exam  Constitutional: He is oriented to person, place, and time. He appears well-developed and well-nourished. No distress.  HENT:  Head: Normocephalic and atraumatic.  Right Ear: External ear normal.  Left Ear: External ear normal.  Nose: Nose normal.  Mouth/Throat: Oropharynx is clear and moist.  Eyes: Conjunctivae and EOM are normal. Pupils are equal, round, and reactive to light.  Neck: Normal range of motion. Neck supple. No thyromegaly present.  Cardiovascular: Normal rate and normal pulses.  An irregularly irregular rhythm present.  Murmur heard.  Systolic  murmur is present with a grade of 2/6  Pulmonary/Chest: Effort normal and breath sounds normal. No respiratory distress. He has no wheezes. He has no rales.  Abdominal: Soft. Bowel sounds are normal. He exhibits no distension. There is no tenderness.  Lymphadenopathy:    He has no cervical adenopathy.  Neurological: He is alert and oriented to person,  place, and time. He has normal reflexes.  Skin: Skin is warm and dry.  Psychiatric: He has a normal mood and affect. His behavior is normal. Judgment and thought content normal.     Lab Results  Component Value Date   WBC 6.6 07/03/2016   HGB 14.0 07/03/2016   HCT 44.0 07/03/2016   PLT 202 07/03/2016   GLUCOSE 162 (H) 07/03/2016   CHOL 188 12/26/2015   TRIG 360 (H) 12/26/2015   HDL 25 (L) 12/26/2015   LDLCALC 91 12/26/2015   ALT 8 (L) 05/01/2016   AST 13 (L) 05/01/2016   NA 134 (L) 07/03/2016   K 3.9 07/03/2016   CL 99 (L) 07/03/2016   CREATININE 1.21 07/03/2016   BUN 31 (H) 07/03/2016   CO2 27 07/03/2016   TSH 0.086 (L) 05/01/2016   INR 2.32 07/03/2016   HGBA1C 6.1 07/14/2015    Dg Chest 2 View  Result Date: 07/03/2016 CLINICAL DATA:  Weakness EXAM: CHEST  2 VIEW COMPARISON:  07/02/2016 FINDINGS: Heart size and vascularity normal. Atherosclerotic calcification aortic arch. Negative for heart failure. Lungs remain clear without infiltrate or effusion. Negative for mass lesion. IMPRESSION: No active cardiopulmonary disease. Electronically Signed   By: Franchot Gallo M.D.   On: 07/03/2016 12:42   Ct Angio Chest Pe W Or Wo Contrast  Result Date: 07/03/2016 CLINICAL DATA:  Generalized weakness.  Shortness of breath. EXAM: CT ANGIOGRAPHY CHEST WITH CONTRAST TECHNIQUE: Multidetector CT imaging of the chest was performed using the standard protocol during bolus administration of intravenous contrast. Multiplanar CT image reconstructions and MIPs were obtained to evaluate the vascular anatomy. CONTRAST:  100 cc Isovue 370 COMPARISON:  Chest radiography same day FINDINGS: Cardiovascular: Pulmonary arterial opacification is excellent. There are no pulmonary emboli. There is extensive aortic atherosclerosis and coronary artery atherosclerosis. No pericardial fluid. Mediastinum/Nodes: Prominent pericarinal lymph nodes, the largest being a precarinal node measuring 2.5 cm in diameter.  Lungs/Pleura: The lungs are clear without evidence of emphysema, infiltrate, mass, effusion or collapse. No pleural fluid. Upper Abdomen: Prominence of the left lobe of the liver relative to the right raising the possibility of early cirrhosis. There are calcified gallstones in the gallbladder but no CT evidence of cholecystitis or obstruction. Musculoskeletal: No fracture or focal lesion. Probable old healed sternal fracture. Review of the MIP images confirms the above findings. IMPRESSION: The lungs are clear and do not show any active process. No pulmonary emboli. Aortic and coronary artery atherosclerosis. Slightly prominent pericarinal lymph nodes. As an isolated finding, this is probably not significant. Question early cirrhosis of the liver. Chololithiasis without CT evidence of cholecystitis or obstruction. Electronically Signed   By: Nelson Chimes M.D.   On: 07/03/2016 16:02    Assessment & Plan:   Marlen was seen today for cough.  Diagnoses and all orders for this visit:  Hypertensive heart and renal disease with renal failure  Hypothyroidism, unspecified hypothyroidism type  Long term current use of anticoagulant therapy  Paroxysmal atrial fibrillation ([place])      I have discontinued Mr. Lempert gabapentin, tamsulosin, and finasteride. I am also having him maintain his fish oil-omega-3  fatty acids, CALCIUM PO, NITROSTAT, cholecalciferol, acetaminophen, fenofibrate micronized, furosemide, carvedilol, traZODone, levothyroxine, amLODipine, warfarin, and Trolamine Salicylate.  No orders of the defined types were placed in this encounter.    Follow-up: Return in about 1 week (around 07/12/2016) for CHF, A fib.  Claretta Fraise, M.D.

## 2016-07-06 LAB — CBC WITH DIFFERENTIAL/PLATELET
BASOS: 0 %
Basophils Absolute: 0 10*3/uL (ref 0.0–0.2)
EOS (ABSOLUTE): 0.3 10*3/uL (ref 0.0–0.4)
EOS: 5 %
HEMATOCRIT: 39.9 % (ref 37.5–51.0)
Hemoglobin: 13.3 g/dL (ref 12.6–17.7)
IMMATURE GRANULOCYTES: 0 %
Immature Grans (Abs): 0 10*3/uL (ref 0.0–0.1)
LYMPHS ABS: 1.1 10*3/uL (ref 0.7–3.1)
Lymphs: 21 %
MCH: 24.5 pg — AB (ref 26.6–33.0)
MCHC: 33.3 g/dL (ref 31.5–35.7)
MCV: 74 fL — AB (ref 79–97)
MONOS ABS: 0.4 10*3/uL (ref 0.1–0.9)
Monocytes: 8 %
NEUTROS ABS: 3.4 10*3/uL (ref 1.4–7.0)
Neutrophils: 66 %
Platelets: 200 10*3/uL (ref 150–379)
RBC: 5.42 x10E6/uL (ref 4.14–5.80)
RDW: 18.8 % — AB (ref 12.3–15.4)
WBC: 5.3 10*3/uL (ref 3.4–10.8)

## 2016-07-06 LAB — CMP14+EGFR
A/G RATIO: 1.4 (ref 1.2–2.2)
ALT: 10 IU/L (ref 0–44)
AST: 14 IU/L (ref 0–40)
Albumin: 3.9 g/dL (ref 3.5–4.7)
Alkaline Phosphatase: 49 IU/L (ref 39–117)
BILIRUBIN TOTAL: 0.3 mg/dL (ref 0.0–1.2)
BUN/Creatinine Ratio: 24 (ref 10–24)
BUN: 26 mg/dL (ref 8–27)
CALCIUM: 8.9 mg/dL (ref 8.6–10.2)
CO2: 26 mmol/L (ref 18–29)
Chloride: 100 mmol/L (ref 96–106)
Creatinine, Ser: 1.08 mg/dL (ref 0.76–1.27)
GFR calc Af Amer: 70 mL/min/{1.73_m2} (ref >59)
GFR, EST NON AFRICAN AMERICAN: 61 mL/min/{1.73_m2} (ref >59)
GLOBULIN, TOTAL: 2.7 g/dL (ref 1.5–4.5)
Glucose: 118 mg/dL — ABNORMAL HIGH (ref 65–99)
POTASSIUM: 3.8 mmol/L (ref 3.5–5.2)
SODIUM: 141 mmol/L (ref 134–144)
Total Protein: 6.6 g/dL (ref 6.0–8.5)

## 2016-07-06 LAB — TSH+FREE T4
FREE T4: 1.58 ng/dL (ref 0.82–1.77)
TSH: 0.096 u[IU]/mL — ABNORMAL LOW (ref 0.450–4.500)

## 2016-07-06 LAB — BRAIN NATRIURETIC PEPTIDE: BNP: 292.8 pg/mL — ABNORMAL HIGH (ref 0.0–100.0)

## 2016-07-09 ENCOUNTER — Other Ambulatory Visit: Payer: Self-pay | Admitting: Family Medicine

## 2016-07-10 ENCOUNTER — Ambulatory Visit (INDEPENDENT_AMBULATORY_CARE_PROVIDER_SITE_OTHER): Payer: Medicare Other | Admitting: Family Medicine

## 2016-07-10 ENCOUNTER — Ambulatory Visit (INDEPENDENT_AMBULATORY_CARE_PROVIDER_SITE_OTHER): Payer: Medicare Other

## 2016-07-10 ENCOUNTER — Encounter: Payer: Self-pay | Admitting: Family Medicine

## 2016-07-10 VITALS — BP 106/53 | HR 78 | Temp 97.6°F | Ht 66.0 in | Wt 167.0 lb

## 2016-07-10 DIAGNOSIS — M25552 Pain in left hip: Secondary | ICD-10-CM

## 2016-07-10 DIAGNOSIS — N19 Unspecified kidney failure: Secondary | ICD-10-CM | POA: Diagnosis not present

## 2016-07-10 DIAGNOSIS — Z9181 History of falling: Secondary | ICD-10-CM

## 2016-07-10 DIAGNOSIS — M1990 Unspecified osteoarthritis, unspecified site: Secondary | ICD-10-CM | POA: Diagnosis not present

## 2016-07-10 DIAGNOSIS — J209 Acute bronchitis, unspecified: Secondary | ICD-10-CM | POA: Diagnosis not present

## 2016-07-10 DIAGNOSIS — I5043 Acute on chronic combined systolic (congestive) and diastolic (congestive) heart failure: Secondary | ICD-10-CM | POA: Diagnosis not present

## 2016-07-10 DIAGNOSIS — R296 Repeated falls: Secondary | ICD-10-CM

## 2016-07-10 DIAGNOSIS — I1310 Hypertensive heart and chronic kidney disease without heart failure, with stage 1 through stage 4 chronic kidney disease, or unspecified chronic kidney disease: Secondary | ICD-10-CM | POA: Diagnosis not present

## 2016-07-10 DIAGNOSIS — M1610 Unilateral primary osteoarthritis, unspecified hip: Secondary | ICD-10-CM

## 2016-07-10 MED ORDER — HYDROCODONE-HOMATROPINE 5-1.5 MG/5ML PO SYRP: 5.0000 mL | ORAL_SOLUTION | Freq: Four times a day (QID) | ORAL | 0 refills | Status: DC | PRN

## 2016-07-10 NOTE — Progress Notes (Signed)
Subjective:  Patient ID: [name], male    DOB: [birth year]  Age: 82 y.o. MRN: [id number]  CC: Hip Pain (pt here today c/o left hip pain since last night)   HPI [name] presents for 2-3 days of increasing pain at the left greater trochanteric area. He says it still goes down the lateral aspect of the left thigh. It does not go into the back nor does it radiate down into the leg he has no known injury. It seems to be getting worse. It is very difficult for him to ambulate due to pain. Ambulating increases the pain. Laying down does not help but laying in his recliner with it slightly bent does.  Patient states that his symptoms from last week are much better. He denies shortness of breath and he denies edema at this time. He is using the medicines as prescribed.   History [name] has a past medical history of Atrial fibrillation ([place]); CAD (coronary artery disease); COPD (chronic obstructive pulmonary disease) ([place]); Dilated cardiomyopathy ([place]); Drug therapy; Dyslipidemia; Hypertension; and Hypothyroidism.   He has a past surgical history that includes Hip surgery (Left); Cardiac catheterization (05/2007); and Coronary stent placement.   His family history includes Diabetes in his other; Heart attack in his father and mother; Stroke in his other. He was adopted.He reports that he has never smoked. He has never used smokeless tobacco. He reports that he does not drink alcohol or use drugs.    ROS Review of Systems  Constitutional: Positive for activity change (limited by pain) and fatigue. Negative for chills and fever.  HENT: Positive for congestion and rhinorrhea.   Respiratory: Positive for cough (mild wiht slight mucous, clear). Negative for chest tightness, shortness of breath and wheezing.   Cardiovascular: Negative for chest pain, palpitations and leg swelling.  Musculoskeletal: Positive for arthralgias and myalgias.  Skin: Negative for color change and rash.    Neurological: Negative for dizziness and syncope.    Objective:  BP (!) 106/53   Pulse 78   Temp 97.6 F (36.4 C) (Oral)   Ht 5\' 6"  (1.676 m)   Wt 167 lb (75.8 kg)   BMI 26.95 kg/m   BP Readings from Last 3 Encounters:  07/10/16 (!) 106/53  07/05/16 136/64  07/03/16 144/72    Wt Readings from Last 3 Encounters:  07/10/16 167 lb (75.8 kg)  07/05/16 162 lb 8 oz (73.7 kg)  07/03/16 160 lb 9 oz (72.8 kg)     Physical Exam  Constitutional: He is oriented to person, place, and time. He appears well-developed. He appears distressed (elderly, frail).  HENT:  Head: Normocephalic and atraumatic.  Eyes: EOM are normal.  Neck: Normal range of motion.  Musculoskeletal: He exhibits tenderness (exquisite tenderness, without erythema at left greater traochanter).  Neurological: He is alert and oriented to person, place, and time.  Skin: Skin is warm and dry.  Psychiatric: He has a normal mood and affect.     Lab Results  Component Value Date   WBC 5.3 07/05/2016   HGB 14.0 07/03/2016   HCT 39.9 07/05/2016   PLT 200 07/05/2016   GLUCOSE 118 (H) 07/05/2016   CHOL 188 12/26/2015   TRIG 360 (H) 12/26/2015   HDL 25 (L) 12/26/2015   LDLCALC 91 12/26/2015   ALT 10 07/05/2016   AST 14 07/05/2016   NA 141 07/05/2016   K 3.8 07/05/2016   CL 100 07/05/2016   CREATININE 1.08 07/05/2016   BUN 26 07/05/2016  CO2 26 07/05/2016   TSH 0.096 (L) 07/05/2016   INR 2.32 07/03/2016   HGBA1C 6.1 07/14/2015    Dg Chest 2 View  Result Date: 07/03/2016 CLINICAL DATA:  Weakness EXAM: CHEST  2 VIEW COMPARISON:  07/02/2016 FINDINGS: Heart size and vascularity normal. Atherosclerotic calcification aortic arch. Negative for heart failure. Lungs remain clear without infiltrate or effusion. Negative for mass lesion. IMPRESSION: No active cardiopulmonary disease. Electronically Signed   By: Franchot Gallo M.D.   On: 07/03/2016 12:42   Ct Angio Chest Pe W Or Wo Contrast  Result Date:  07/03/2016 CLINICAL DATA:  Generalized weakness.  Shortness of breath. EXAM: CT ANGIOGRAPHY CHEST WITH CONTRAST TECHNIQUE: Multidetector CT imaging of the chest was performed using the standard protocol during bolus administration of intravenous contrast. Multiplanar CT image reconstructions and MIPs were obtained to evaluate the vascular anatomy. CONTRAST:  100 cc Isovue 370 COMPARISON:  Chest radiography same day FINDINGS: Cardiovascular: Pulmonary arterial opacification is excellent. There are no pulmonary emboli. There is extensive aortic atherosclerosis and coronary artery atherosclerosis. No pericardial fluid. Mediastinum/Nodes: Prominent pericarinal lymph nodes, the largest being a precarinal node measuring 2.5 cm in diameter. Lungs/Pleura: The lungs are clear without evidence of emphysema, infiltrate, mass, effusion or collapse. No pleural fluid. Upper Abdomen: Prominence of the left lobe of the liver relative to the right raising the possibility of early cirrhosis. There are calcified gallstones in the gallbladder but no CT evidence of cholecystitis or obstruction. Musculoskeletal: No fracture or focal lesion. Probable old healed sternal fracture. Review of the MIP images confirms the above findings. IMPRESSION: The lungs are clear and do not show any active process. No pulmonary emboli. Aortic and coronary artery atherosclerosis. Slightly prominent pericarinal lymph nodes. As an isolated finding, this is probably not significant. Question early cirrhosis of the liver. Chololithiasis without CT evidence of cholecystitis or obstruction. Electronically Signed   By: Nelson Chimes M.D.   On: 07/03/2016 16:02    Assessment & Plan:   [name] was seen today for hip pain.  Diagnoses and all orders for this visit:  Left hip pain -     DG HIP UNILAT W OR W/O PELVIS 2-3 VIEWS LEFT; Future  Acute on chronic combined systolic and diastolic CHF (congestive heart failure) (HCC)  Arthritis, hip  At high risk  for falls  Hypertensive heart and renal disease with renal failure  Acute bronchitis, unspecified organism  Other orders -     HYDROcodone-homatropine (HYCODAN) 5-1.5 MG/5ML syrup; Take 5 mLs by mouth every 6 (six) hours as needed for cough.  A steroid injection was performed at left greater trochanteric bursa. After sterile prep and local with  using 1% plain Lidocaine, 6 mg of Celestone mixed in 3 cc marcan was injected  With minimal resistance. This was well tolerated.   There is improvement in the heart failure and renal insufficiency noted. He is now asymptomatic from these. However the arthritis in his left hip is exacerbated.  X-ray shows no acute change in the left hip  I have discontinued Mr. Segrest levofloxacin. I am also having him start on HYDROcodone-homatropine. Additionally, I am having him maintain his fish oil-omega-3 fatty acids, CALCIUM PO, NITROSTAT, cholecalciferol, acetaminophen, fenofibrate micronized, furosemide, traZODone, levothyroxine, amLODipine, warfarin, Trolamine Salicylate, and carvedilol.  Meds ordered this encounter  Medications  . HYDROcodone-homatropine (HYCODAN) 5-1.5 MG/5ML syrup    Sig: Take 5 mLs by mouth every 6 (six) hours as needed for cough.    Dispense:  120 mL  Refill:  0     Follow-up: Return in about 1 month (around 08/09/2016), or if symptoms worsen or fail to improve.  Claretta Fraise, M.D.

## 2016-07-12 ENCOUNTER — Other Ambulatory Visit: Payer: Self-pay | Admitting: Cardiology

## 2016-07-12 ENCOUNTER — Ambulatory Visit: Payer: Medicare Other | Admitting: Family Medicine

## 2016-07-12 NOTE — Telephone Encounter (Signed)
Rx(s) sent to pharmacy electronically.  

## 2016-07-15 ENCOUNTER — Ambulatory Visit (INDEPENDENT_AMBULATORY_CARE_PROVIDER_SITE_OTHER): Payer: Medicare Other

## 2016-07-15 ENCOUNTER — Ambulatory Visit (INDEPENDENT_AMBULATORY_CARE_PROVIDER_SITE_OTHER): Payer: Medicare Other | Admitting: Family Medicine

## 2016-07-15 ENCOUNTER — Encounter: Payer: Self-pay | Admitting: Family Medicine

## 2016-07-15 VITALS — BP 116/68 | HR 98 | Temp 97.9°F | Ht 66.0 in | Wt 164.2 lb

## 2016-07-15 DIAGNOSIS — R1031 Right lower quadrant pain: Secondary | ICD-10-CM

## 2016-07-15 DIAGNOSIS — R059 Cough, unspecified: Secondary | ICD-10-CM

## 2016-07-15 DIAGNOSIS — R05 Cough: Secondary | ICD-10-CM | POA: Diagnosis not present

## 2016-07-15 LAB — MICROSCOPIC EXAMINATION: BACTERIA UA: NONE SEEN

## 2016-07-15 LAB — URINALYSIS, COMPLETE
Bilirubin, UA: NEGATIVE
GLUCOSE, UA: NEGATIVE
KETONES UA: NEGATIVE
Leukocytes, UA: NEGATIVE
NITRITE UA: NEGATIVE
Protein, UA: NEGATIVE
Specific Gravity, UA: 1.015 (ref 1.005–1.030)
Urobilinogen, Ur: 0.2 mg/dL (ref 0.2–1.0)
pH, UA: 6.5 (ref 5.0–7.5)

## 2016-07-15 MED ORDER — BENZONATATE 200 MG PO CAPS: 200.0000 mg | ORAL_CAPSULE | Freq: Three times a day (TID) | ORAL | 0 refills | Status: DC | PRN

## 2016-07-15 NOTE — Progress Notes (Addendum)
Subjective:  Patient ID: Jhovanny Guinta, male    DOB: Aug 07, 1926  Age: 82 y.o. MRN: [id number]  CC: RLQ pain (pt here today c/o RLQ pain for the past 2-3 days and a hacking cough)   HPI Zyere Jiminez presents for 3 days. Increasing right lower quadrant pain. It it is an ache with some throbbing. Patient says it hurts pretty bad rates it approximately 6/10. It is constant. It is not relieved by rest or change of position. It does not radiate into the flank. He is taking some hydrocodone cough syrup. He does have some problems with constipation intermittently historically. He denies diarrhea. He denies melena. No hematochezia. He also states that his cough has continued. He went through the 4 ounces of hydrocodone cough syrup over the last 5 days.   History Marlan has a past medical history of Atrial fibrillation ([place]); CAD (coronary artery disease); COPD (chronic obstructive pulmonary disease) ([place]); Dilated cardiomyopathy ([place]); Drug therapy; Dyslipidemia; Hypertension; and Hypothyroidism.   He has a past surgical history that includes Hip surgery (Left); Cardiac catheterization (05/2007); and Coronary stent placement.   His family history includes Diabetes in his other; Heart attack in his father and mother; Stroke in his other. He was adopted.He reports that he has never smoked. He has never used smokeless tobacco. He reports that he does not drink alcohol or use drugs.    ROS Review of Systems  Constitutional: Negative for chills, diaphoresis, fever and unexpected weight change.  HENT: Negative for rhinorrhea and trouble swallowing.   Respiratory: Positive for cough (nonproductive). Negative for chest tightness and shortness of breath.   Cardiovascular: Negative for chest pain.  Gastrointestinal: Positive for abdominal pain. Negative for abdominal distention, blood in stool, constipation, nausea, rectal pain and vomiting.  Genitourinary: Negative for dysuria, flank pain and hematuria.    Musculoskeletal: Negative for arthralgias and joint swelling.  Skin: Negative for rash.  Neurological: Negative for syncope and headaches.    Objective:  BP 116/68   Pulse 98   Temp 97.9 F (36.6 C) (Oral)   Ht '5\' 6"'$  (1.676 m)   Wt 164 lb 4 oz (74.5 kg)   BMI 26.51 kg/m   BP Readings from Last 3 Encounters:  07/15/16 116/68  07/10/16 (!) 106/53  07/05/16 136/64    Wt Readings from Last 3 Encounters:  07/15/16 164 lb 4 oz (74.5 kg)  07/10/16 167 lb (75.8 kg)  07/05/16 162 lb 8 oz (73.7 kg)     Physical Exam  Constitutional: He is oriented to person, place, and time. He appears well-developed and well-nourished.  HENT:  Head: Normocephalic and atraumatic.  Right Ear: Tympanic membrane and external ear normal. No decreased hearing is noted.  Left Ear: Tympanic membrane and external ear normal. No decreased hearing is noted.  Mouth/Throat: No oropharyngeal exudate or posterior oropharyngeal erythema.  Eyes: Pupils are equal, round, and reactive to light.  Neck: Normal range of motion. Neck supple.  Cardiovascular: Normal rate and regular rhythm.   No murmur heard. Pulmonary/Chest: Breath sounds normal. No respiratory distress.  Abdominal: Soft. He exhibits distension (bowel sounds slightly increased without tympany. Percussion reveals increased gas at the right gutter region). He exhibits no mass. There is tenderness (moderate at the right lower quadrant). There is no rebound and no guarding.  Genitourinary: Penis normal.  Genitourinary Comments: The tenderness is close to the inguinal area. Therefore scrotal check for hernia performed was negative.  Neurological: He is alert and oriented to person, place, and  time.  Skin: Skin is warm and dry. No rash noted. No erythema.  Vitals reviewed.    Lab Results  Component Value Date   WBC 5.3 07/05/2016   HGB 14.0 07/03/2016   HCT 39.9 07/05/2016   PLT 200 07/05/2016   GLUCOSE 118 (H) 07/05/2016   CHOL 188 12/26/2015    TRIG 360 (H) 12/26/2015   HDL 25 (L) 12/26/2015   LDLCALC 91 12/26/2015   ALT 10 07/05/2016   AST 14 07/05/2016   NA 141 07/05/2016   K 3.8 07/05/2016   CL 100 07/05/2016   CREATININE 1.08 07/05/2016   BUN 26 07/05/2016   CO2 26 07/05/2016   TSH 0.096 (L) 07/05/2016   INR 2.32 07/03/2016   HGBA1C 6.1 07/14/2015    Dg Chest 2 View  Result Date: 07/03/2016 CLINICAL DATA:  Weakness EXAM: CHEST  2 VIEW COMPARISON:  07/02/2016 FINDINGS: Heart size and vascularity normal. Atherosclerotic calcification aortic arch. Negative for heart failure. Lungs remain clear without infiltrate or effusion. Negative for mass lesion. IMPRESSION: No active cardiopulmonary disease. Electronically Signed   By: Marlan Palau M.D.   On: 07/03/2016 12:42   Ct Angio Chest Pe W Or Wo Contrast  Result Date: 07/03/2016 CLINICAL DATA:  Generalized weakness.  Shortness of breath. EXAM: CT ANGIOGRAPHY CHEST WITH CONTRAST TECHNIQUE: Multidetector CT imaging of the chest was performed using the standard protocol during bolus administration of intravenous contrast. Multiplanar CT image reconstructions and MIPs were obtained to evaluate the vascular anatomy. CONTRAST:  100 cc Isovue 370 COMPARISON:  Chest radiography same day FINDINGS: Cardiovascular: Pulmonary arterial opacification is excellent. There are no pulmonary emboli. There is extensive aortic atherosclerosis and coronary artery atherosclerosis. No pericardial fluid. Mediastinum/Nodes: Prominent pericarinal lymph nodes, the largest being a precarinal node measuring 2.5 cm in diameter. Lungs/Pleura: The lungs are clear without evidence of emphysema, infiltrate, mass, effusion or collapse. No pleural fluid. Upper Abdomen: Prominence of the left lobe of the liver relative to the right raising the possibility of early cirrhosis. There are calcified gallstones in the gallbladder but no CT evidence of cholecystitis or obstruction. Musculoskeletal: No fracture or focal lesion.  Probable old healed sternal fracture. Review of the MIP images confirms the above findings. IMPRESSION: The lungs are clear and do not show any active process. No pulmonary emboli. Aortic and coronary artery atherosclerosis. Slightly prominent pericarinal lymph nodes. As an isolated finding, this is probably not significant. Question early cirrhosis of the liver. Chololithiasis without CT evidence of cholecystitis or obstruction. Electronically Signed   By: Paulina Fusi M.D.   On: 07/03/2016 16:02    Assessment & Plan:   Jayleon was seen today for rlq pain.  Diagnoses and all orders for this visit:  Abdominal pain, RLQ (right lower quadrant) -     CBC with Differential/Platelet -     CMP14+EGFR -     Urinalysis, Complete -     Urine culture -     Amylase -     Lipase -     DG Abd Acute W/Chest; Future  Cough  Other orders -     benzonatate (TESSALON) 200 MG capsule; Take 1 capsule (200 mg total) by mouth 3 (three) times daily as needed for cough.    Acute abdomen series shows that the chest is clear. No active disease. The abdomen shows increased gas throughout with some increase in stool. Early signs of ileus versus excess gas. Await radiology read.  I am having Mr. Bennison start on  benzonatate. I am also having him maintain his fish oil-omega-3 fatty acids, CALCIUM PO, NITROSTAT, cholecalciferol, acetaminophen, fenofibrate micronized, furosemide, traZODone, levothyroxine, warfarin, Trolamine Salicylate, carvedilol, HYDROcodone-homatropine, and amLODipine.  Meds ordered this encounter  Medications  . benzonatate (TESSALON) 200 MG capsule    Sig: Take 1 capsule (200 mg total) by mouth 3 (three) times daily as needed for cough.    Dispense:  20 capsule    Refill:  0     Follow-up: Return in about 2 days (around 07/17/2016).  Claretta Fraise, M.D.

## 2016-07-15 NOTE — Addendum Note (Signed)
Addended by: Claretta Fraise on: 07/15/2016 05:16 PM   Modules accepted: Orders

## 2016-07-16 LAB — CBC WITH DIFFERENTIAL/PLATELET
Basophils Absolute: 0 10*3/uL (ref 0.0–0.2)
Basos: 0 %
EOS (ABSOLUTE): 0.3 10*3/uL (ref 0.0–0.4)
EOS: 5 %
HEMATOCRIT: 41.3 % (ref 37.5–51.0)
Hemoglobin: 13 g/dL (ref 12.6–17.7)
Immature Grans (Abs): 0 10*3/uL (ref 0.0–0.1)
Immature Granulocytes: 1 %
LYMPHS ABS: 1.3 10*3/uL (ref 0.7–3.1)
Lymphs: 22 %
MCH: 23.7 pg — AB (ref 26.6–33.0)
MCHC: 31.5 g/dL (ref 31.5–35.7)
MCV: 75 fL — ABNORMAL LOW (ref 79–97)
MONOS ABS: 0.4 10*3/uL (ref 0.1–0.9)
Monocytes: 8 %
NEUTROS ABS: 3.8 10*3/uL (ref 1.4–7.0)
Neutrophils: 64 %
Platelets: 344 10*3/uL (ref 150–379)
RBC: 5.49 x10E6/uL (ref 4.14–5.80)
RDW: 19.5 % — AB (ref 12.3–15.4)
WBC: 5.8 10*3/uL (ref 3.4–10.8)

## 2016-07-16 LAB — URINE CULTURE

## 2016-07-16 LAB — CMP14+EGFR
A/G RATIO: 1.4 (ref 1.2–2.2)
ALBUMIN: 4 g/dL (ref 3.5–4.7)
ALK PHOS: 55 IU/L (ref 39–117)
ALT: 15 IU/L (ref 0–44)
AST: 20 IU/L (ref 0–40)
BILIRUBIN TOTAL: 0.4 mg/dL (ref 0.0–1.2)
BUN / CREAT RATIO: 22 (ref 10–24)
BUN: 25 mg/dL (ref 8–27)
CHLORIDE: 94 mmol/L — AB (ref 96–106)
CO2: 29 mmol/L (ref 18–29)
CREATININE: 1.12 mg/dL (ref 0.76–1.27)
Calcium: 9.2 mg/dL (ref 8.6–10.2)
GFR calc Af Amer: 67 mL/min/{1.73_m2} (ref >59)
GFR calc non Af Amer: 58 mL/min/{1.73_m2} — ABNORMAL LOW (ref >59)
GLOBULIN, TOTAL: 2.9 g/dL (ref 1.5–4.5)
Glucose: 111 mg/dL — ABNORMAL HIGH (ref 65–99)
POTASSIUM: 4.5 mmol/L (ref 3.5–5.2)
SODIUM: 138 mmol/L (ref 134–144)
Total Protein: 6.9 g/dL (ref 6.0–8.5)

## 2016-07-16 LAB — AMYLASE: Amylase: 31 U/L (ref 31–124)

## 2016-07-16 LAB — LIPASE: Lipase: 17 U/L (ref 13–78)

## 2016-07-17 ENCOUNTER — Other Ambulatory Visit: Payer: Self-pay | Admitting: Family Medicine

## 2016-07-17 ENCOUNTER — Encounter: Payer: Self-pay | Admitting: Family Medicine

## 2016-07-17 ENCOUNTER — Ambulatory Visit (INDEPENDENT_AMBULATORY_CARE_PROVIDER_SITE_OTHER): Payer: Medicare Other

## 2016-07-17 ENCOUNTER — Ambulatory Visit (INDEPENDENT_AMBULATORY_CARE_PROVIDER_SITE_OTHER): Payer: Medicare Other | Admitting: Family Medicine

## 2016-07-17 VITALS — BP 131/70 | HR 99 | Temp 96.9°F | Ht 66.0 in | Wt 165.0 lb

## 2016-07-17 DIAGNOSIS — R1031 Right lower quadrant pain: Secondary | ICD-10-CM

## 2016-07-17 DIAGNOSIS — M1610 Unilateral primary osteoarthritis, unspecified hip: Secondary | ICD-10-CM

## 2016-07-17 DIAGNOSIS — R059 Cough, unspecified: Secondary | ICD-10-CM

## 2016-07-17 DIAGNOSIS — R05 Cough: Secondary | ICD-10-CM

## 2016-07-17 DIAGNOSIS — N19 Unspecified kidney failure: Secondary | ICD-10-CM | POA: Diagnosis not present

## 2016-07-17 DIAGNOSIS — I480 Paroxysmal atrial fibrillation: Secondary | ICD-10-CM

## 2016-07-17 DIAGNOSIS — I1310 Hypertensive heart and chronic kidney disease without heart failure, with stage 1 through stage 4 chronic kidney disease, or unspecified chronic kidney disease: Secondary | ICD-10-CM

## 2016-07-17 MED ORDER — KETOROLAC TROMETHAMINE 60 MG/2ML IM SOLN
30.0000 mg | Freq: Once | INTRAMUSCULAR | Status: AC
  Administered 2016-07-17: 30 mg via INTRAMUSCULAR

## 2016-07-17 NOTE — Progress Notes (Signed)
Subjective:  Patient ID: [name], male    DOB: [birth year]  Age: 82 y.o. MRN: [id number]  CC: Cough (recheck); Hip Pain; and low BP   HPI [name] presents for right lower quadrant pain. It itNearly resolved. He did have a good bowel movement and that helped. On the other hand within the last few hours he's developed a burning sensation in both hips.. Patient says it hurts pretty bad rates it approximately 6/10. It is constant. It is not relieved by rest or change of position. It does not radiate. He also states that his cough has continued. It is more manageable since starting the benzonatate but does continue. He denies shortness of breath and edema. History [name] has a past medical history of Atrial fibrillation ([place]); CAD (coronary artery disease); COPD (chronic obstructive pulmonary disease) ([place]); Dilated cardiomyopathy ([place]); Drug therapy; Dyslipidemia; Hypertension; and Hypothyroidism.   He has a past surgical history that includes Hip surgery (Left); Cardiac catheterization (05/2007); and Coronary stent placement.   His family history includes Diabetes in his other; Heart attack in his father and mother; Stroke in his other. He was adopted.He reports that he has never smoked. He has never used smokeless tobacco. He reports that he does not drink alcohol or use drugs.    ROS Review of Systems  Constitutional: Positive for fatigue. Negative for chills, diaphoresis, fever and unexpected weight change.  HENT: Negative for rhinorrhea and trouble swallowing.   Respiratory: Positive for cough (nonproductive) and shortness of breath. Negative for chest tightness.   Cardiovascular: Negative for chest pain.  Gastrointestinal: Negative for abdominal distention, abdominal pain, blood in stool, constipation, nausea, rectal pain and vomiting.  Genitourinary: Negative for dysuria, flank pain and hematuria.  Musculoskeletal: Positive for arthralgias. Negative for joint swelling.  Skin:  Negative for rash.  Neurological: Positive for weakness (nonfocal). Negative for syncope and headaches.    Objective:  BP 131/70   Pulse 99 Comment: Had walked back from xray.  Temp (!) 96.9 F (36.1 C) (Oral)   Ht 5\' 6"  (1.676 m)   Wt 165 lb (74.8 kg)   SpO2 98% Comment: sitting  BMI 26.63 kg/m   BP Readings from Last 3 Encounters:  07/17/16 131/70  07/15/16 116/68  07/10/16 (!) 106/53    Wt Readings from Last 3 Encounters:  07/17/16 165 lb (74.8 kg)  07/15/16 164 lb 4 oz (74.5 kg)  07/10/16 167 lb (75.8 kg)     Physical Exam  Constitutional: He is oriented to person, place, and time. He appears well-developed and well-nourished. No distress.  HENT:  Head: Normocephalic and atraumatic.  Right Ear: Tympanic membrane and external ear normal. No decreased hearing is noted.  Left Ear: Tympanic membrane and external ear normal. No decreased hearing is noted.  Mouth/Throat: No oropharyngeal exudate or posterior oropharyngeal erythema.  Eyes: Pupils are equal, round, and reactive to light.  Neck: Normal range of motion. Neck supple.  Cardiovascular: Normal rate and regular rhythm.   No murmur heard. Pulmonary/Chest: Breath sounds normal. No respiratory distress.  Abdominal: Soft. Bowel sounds are normal. He exhibits no distension and no mass. There is no rebound and no guarding.  Neurological: He is alert and oriented to person, place, and time.  Skin: Skin is warm and dry. No rash noted. No erythema.  Vitals reviewed.    Lab Results  Component Value Date   WBC 5.8 07/15/2016   HGB 14.0 07/03/2016   HCT 41.3 07/15/2016   PLT 344 07/15/2016  GLUCOSE 111 (H) 07/15/2016   CHOL 188 12/26/2015   TRIG 360 (H) 12/26/2015   HDL 25 (L) 12/26/2015   LDLCALC 91 12/26/2015   ALT 15 07/15/2016   AST 20 07/15/2016   NA 138 07/15/2016   K 4.5 07/15/2016   CL 94 (L) 07/15/2016   CREATININE 1.12 07/15/2016   BUN 25 07/15/2016   CO2 29 07/15/2016   TSH 0.096 (L) 07/05/2016     INR 2.32 07/03/2016   HGBA1C 6.1 07/14/2015    Dg Chest 2 View  Result Date: 07/03/2016 CLINICAL DATA:  Weakness EXAM: CHEST  2 VIEW COMPARISON:  07/02/2016 FINDINGS: Heart size and vascularity normal. Atherosclerotic calcification aortic arch. Negative for heart failure. Lungs remain clear without infiltrate or effusion. Negative for mass lesion. IMPRESSION: No active cardiopulmonary disease. Electronically Signed   By: Franchot Gallo M.D.   On: 07/03/2016 12:42   Ct Angio Chest Pe W Or Wo Contrast  Result Date: 07/03/2016 CLINICAL DATA:  Generalized weakness.  Shortness of breath. EXAM: CT ANGIOGRAPHY CHEST WITH CONTRAST . IMPRESSION: The lungs are clear and do not show any active process. No pulmonary emboli. Aortic and coronary artery atherosclerosis. Slightly prominent pericarinal lymph nodes. As an isolated finding, this is probably not significant. Question early cirrhosis of the liver. Chololithiasis without CT evidence of cholecystitis or obstruction. Electronically Signed   By: Nelson Chimes M.D.   On: 07/03/2016 16:02    Assessment & Plan:   [name] was seen today for cough, hip pain and low bp.  Diagnoses and all orders for this visit:  Cough -     DG Chest 2 View; Future  Abdominal pain, RLQ (right lower quadrant)  Arthritis, hip -     ketorolac (TORADOL) injection 30 mg; Inject 1 mL (30 mg total) into the muscle once.  Hypertensive heart and renal disease with renal failure  Paroxysmal atrial fibrillation (HCC)    Acute abdomen series shows that the chest is clear. No active disease. The abdomen shows increased gas throughout with some increase in stool. Early signs of ileus versus excess gas. Await radiology read.  I am having [name] maintain his fish oil-omega-3 fatty acids, CALCIUM PO, NITROSTAT, cholecalciferol, acetaminophen, fenofibrate micronized, furosemide, traZODone, levothyroxine, warfarin, Trolamine Salicylate, carvedilol, HYDROcodone-homatropine,  amLODipine, benzonatate, and escitalopram. We administered ketorolac.  Meds ordered this encounter  Medications  . ketorolac (TORADOL) injection 30 mg     Follow-up: Return in about 1 week (around 07/24/2016).  Claretta Fraise, M.D.

## 2016-07-18 ENCOUNTER — Other Ambulatory Visit: Payer: Self-pay | Admitting: Family Medicine

## 2016-07-24 ENCOUNTER — Encounter: Payer: Self-pay | Admitting: Family Medicine

## 2016-07-24 ENCOUNTER — Telehealth: Payer: Self-pay | Admitting: Family Medicine

## 2016-07-24 ENCOUNTER — Other Ambulatory Visit: Payer: Self-pay | Admitting: Family Medicine

## 2016-07-24 ENCOUNTER — Ambulatory Visit (INDEPENDENT_AMBULATORY_CARE_PROVIDER_SITE_OTHER): Payer: Medicare Other | Admitting: Family Medicine

## 2016-07-24 VITALS — BP 122/72 | HR 50 | Temp 98.2°F | Ht 66.0 in | Wt 166.4 lb

## 2016-07-24 DIAGNOSIS — R1031 Right lower quadrant pain: Secondary | ICD-10-CM

## 2016-07-24 MED ORDER — POLYETHYLENE GLYCOL 3350 17 GM/SCOOP PO POWD: 17.0000 g | Freq: Every day | ORAL | 5 refills | Status: AC | PRN

## 2016-07-24 MED ORDER — LINACLOTIDE 145 MCG PO CAPS: 145.0000 ug | ORAL_CAPSULE | Freq: Every day | ORAL | 5 refills | Status: DC

## 2016-07-24 NOTE — Progress Notes (Signed)
Subjective:  Patient ID: [name], male    DOB: 07-May-1926  Age: 82 y.o. MRN: [id number]  CC: Abdominal Pain (pt here today for RLQ pain that started a couple of days ago)   HPI [name] presents for No bowel movement for several days. With onset a Couple of days ago the right inguinal pain previously described recurred. See my previous note. It is a 5/10 dull ache at this point. No nausea or vomiting. Definitely no diarrhea. He is urinating adequately currently without frequency or excessive nocturia.   History [name] has a past medical history of Atrial fibrillation ([place]); CAD (coronary artery disease); COPD (chronic obstructive pulmonary disease) ([place]); Dilated cardiomyopathy ([place]); Drug therapy; Dyslipidemia; Hypertension; and Hypothyroidism.   He has a past surgical history that includes Hip surgery (Left); Cardiac catheterization (05/2007); and Coronary stent placement.   His family history includes Diabetes in his other; Heart attack in his father and mother; Stroke in his other. He was adopted.He reports that he has never smoked. He has never used smokeless tobacco. He reports that he does not drink alcohol or use drugs.    ROS Review of Systems  Constitutional: Negative for chills, diaphoresis and fever.  HENT: Negative for rhinorrhea and sore throat.   Respiratory: Negative for cough and shortness of breath.   Cardiovascular: Negative for chest pain.  Gastrointestinal: Positive for abdominal distention and abdominal pain. Negative for anal bleeding, blood in stool, nausea, rectal pain and vomiting.  Musculoskeletal: Negative for arthralgias and myalgias.  Skin: Negative for rash.  Neurological: Negative for weakness and headaches.    Objective:  BP 122/72   Pulse (!) 50   Temp 98.2 F (36.8 C) (Oral)   Ht 5\' 6"  (1.676 m)   Wt 166 lb 6 oz (75.5 kg)   BMI 26.85 kg/m   BP Readings from Last 3 Encounters:  07/24/16 122/72  07/17/16 131/70  07/15/16 116/68      Wt Readings from Last 3 Encounters:  07/24/16 166 lb 6 oz (75.5 kg)  07/17/16 165 lb (74.8 kg)  07/15/16 164 lb 4 oz (74.5 kg)     Physical Exam  Constitutional: He appears well-developed and well-nourished.  HENT:  Head: Normocephalic and atraumatic.  Right Ear: Tympanic membrane and external ear normal. No decreased hearing is noted.  Left Ear: Tympanic membrane and external ear normal. No decreased hearing is noted.  Mouth/Throat: No oropharyngeal exudate or posterior oropharyngeal erythema.  Eyes: Pupils are equal, round, and reactive to light.  Neck: Normal range of motion. Neck supple.  Cardiovascular: Normal rate and regular rhythm.   No murmur heard. Pulmonary/Chest: Breath sounds normal. No respiratory distress.  Abdominal: Soft. Bowel sounds are normal. He exhibits no mass. There is tenderness (mild at right inginal area).  Vitals reviewed.    Lab Results  Component Value Date   WBC 5.8 07/15/2016   HGB 14.0 07/03/2016   HCT 41.3 07/15/2016   PLT 344 07/15/2016   GLUCOSE 111 (H) 07/15/2016   CHOL 188 12/26/2015   TRIG 360 (H) 12/26/2015   HDL 25 (L) 12/26/2015   LDLCALC 91 12/26/2015   ALT 15 07/15/2016   AST 20 07/15/2016   NA 138 07/15/2016   K 4.5 07/15/2016   CL 94 (L) 07/15/2016   CREATININE 1.12 07/15/2016   BUN 25 07/15/2016   CO2 29 07/15/2016   TSH 0.096 (L) 07/05/2016   INR 2.32 07/03/2016   HGBA1C 6.1 07/14/2015    Dg Chest 2 View  Result Date: 07/03/2016 CLINICAL DATA:  Weakness EXAM: CHEST  2 VIEW COMPARISON:  07/02/2016 FINDINGS: Heart size and vascularity normal. Atherosclerotic calcification aortic arch. Negative for heart failure. Lungs remain clear without infiltrate or effusion. Negative for mass lesion. IMPRESSION: No active cardiopulmonary disease. Electronically Signed   By: Franchot Gallo M.D.   On: 07/03/2016 12:42   Ct Angio Chest Pe W Or Wo Contrast  Result Date: 07/03/2016 CLINICAL DATA:  Generalized weakness.  Shortness  of breath. EXAM: CT ANGIOGRAPHY CHEST WITH CONTRAST TECHNIQUE: Multidetector CT imaging of the chest was performed using the standard protocol during bolus administration of intravenous contrast. Multiplanar CT image reconstructions and MIPs were obtained to evaluate the vascular anatomy. CONTRAST:  100 cc Isovue 370 COMPARISON:  Chest radiography same day FINDINGS: Cardiovascular: Pulmonary arterial opacification is excellent. There are no pulmonary emboli. There is extensive aortic atherosclerosis and coronary artery atherosclerosis. No pericardial fluid. Mediastinum/Nodes: Prominent pericarinal lymph nodes, the largest being a precarinal node measuring 2.5 cm in diameter. Lungs/Pleura: The lungs are clear without evidence of emphysema, infiltrate, mass, effusion or collapse. No pleural fluid. Upper Abdomen: Prominence of the left lobe of the liver relative to the right raising the possibility of early cirrhosis. There are calcified gallstones in the gallbladder but no CT evidence of cholecystitis or obstruction. Musculoskeletal: No fracture or focal lesion. Probable old healed sternal fracture. Review of the MIP images confirms the above findings. IMPRESSION: The lungs are clear and do not show any active process. No pulmonary emboli. Aortic and coronary artery atherosclerosis. Slightly prominent pericarinal lymph nodes. As an isolated finding, this is probably not significant. Question early cirrhosis of the liver. Chololithiasis without CT evidence of cholecystitis or obstruction. Electronically Signed   By: Nelson Chimes M.D.   On: 07/03/2016 16:02    Assessment & Plan:   [name] was seen today for abdominal pain.  Diagnoses and all orders for this visit:  Abdominal pain, RLQ (right lower quadrant)  Other orders -     polyethylene glycol powder (GLYCOLAX/MIRALAX) powder; Take 17 g by mouth daily as needed. For constipation -     linaclotide (LINZESS) 145 MCG CAPS capsule; Take 1 capsule (145 mcg  total) by mouth daily. To regulate bowel movements    I have discontinued [name] HYDROcodone-homatropine and benzonatate. I am also having him start on polyethylene glycol powder and linaclotide. Additionally, I am having him maintain his fish oil-omega-3 fatty acids, CALCIUM PO, NITROSTAT, cholecalciferol, acetaminophen, furosemide, traZODone, warfarin, Trolamine Salicylate, carvedilol, amLODipine, escitalopram, and fenofibrate micronized.  Meds ordered this encounter  Medications  . polyethylene glycol powder (GLYCOLAX/MIRALAX) powder    Sig: Take 17 g by mouth daily as needed. For constipation    Dispense:  3350 g    Refill:  5  . linaclotide (LINZESS) 145 MCG CAPS capsule    Sig: Take 1 capsule (145 mcg total) by mouth daily. To regulate bowel movements    Dispense:  30 capsule    Refill:  5     Follow-up: Return in about 2 weeks (around 08/07/2016).  Claretta Fraise, M.D.

## 2016-07-24 NOTE — Telephone Encounter (Signed)
appt scheduled Pt notified 

## 2016-07-29 ENCOUNTER — Other Ambulatory Visit: Payer: Self-pay | Admitting: Family Medicine

## 2016-07-30 ENCOUNTER — Ambulatory Visit (INDEPENDENT_AMBULATORY_CARE_PROVIDER_SITE_OTHER): Payer: Medicare Other

## 2016-07-30 ENCOUNTER — Encounter: Payer: Self-pay | Admitting: Family Medicine

## 2016-07-30 ENCOUNTER — Ambulatory Visit (INDEPENDENT_AMBULATORY_CARE_PROVIDER_SITE_OTHER): Payer: Medicare Other | Admitting: Family Medicine

## 2016-07-30 VITALS — BP 96/65 | HR 141 | Temp 97.6°F | Ht 66.0 in | Wt 164.0 lb

## 2016-07-30 DIAGNOSIS — M1712 Unilateral primary osteoarthritis, left knee: Secondary | ICD-10-CM

## 2016-07-30 DIAGNOSIS — M25462 Effusion, left knee: Secondary | ICD-10-CM | POA: Diagnosis not present

## 2016-07-30 DIAGNOSIS — M170 Bilateral primary osteoarthritis of knee: Secondary | ICD-10-CM | POA: Insufficient documentation

## 2016-07-30 NOTE — Progress Notes (Signed)
Subjective:  Patient ID: Kongpheng Esperanza, male    DOB: [birth year]  Age: 82 y.o. MRN: [id number]  CC: Knee Pain (pt here today c/o left knee pain, he just woke up with knee pain and swelling.)   HPI Swayde Emanuelson presents for Sudden onset when he awoke this morning of swelling and pain in the left knee. He has no known injury. He does have previous evaluation showing arthritis. He also has a history of a intra-articular chondroma there is calcified on MRI. He can ambulate as long as he uses a cane to redistribute his weight. The pain does not radiate. It is moderately severe. It does interfere with his activities.  History Taven has a past medical history of Atrial fibrillation ([place]); CAD (coronary artery disease); COPD (chronic obstructive pulmonary disease) ([place]); Dilated cardiomyopathy ([place]); Drug therapy; Dyslipidemia; Hypertension; and Hypothyroidism.   He has a past surgical history that includes Hip surgery (Left); Cardiac catheterization (05/2007); and Coronary stent placement.   His family history includes Diabetes in his other; Heart attack in his father and mother; Stroke in his other. He was adopted.He reports that he has never smoked. He has never used smokeless tobacco. He reports that he does not drink alcohol or use drugs.  Current Outpatient Prescriptions on File Prior to Visit  Medication Sig Dispense Refill  . acetaminophen (TYLENOL) 500 MG tablet Take 1-2 tablets (500-1,000 mg total) by mouth 2 (two) times daily as needed. (Patient taking differently: Take 500-1,000 mg by mouth 2 (two) times daily as needed for mild pain. ) 30 tablet 0  . amLODipine (NORVASC) 10 MG tablet TAKE 1 TABLET DAILY 30 tablet 10  . benzonatate (TESSALON) 200 MG capsule TAKE 1 CAPSULE THREE TIMES DAILY AS NEEDED FOR COUGH 20 capsule 0  . CALCIUM PO Take 630 mg by mouth daily. Each tablet is 315mg  - take 2 tablets once daily in the evening    . carvedilol (COREG) 12.5 MG tablet 1/2 each morning and 1  each evening 60 tablet 5  . cholecalciferol (VITAMIN D) 1000 units tablet Take 1,000 Units by mouth daily. Helps with balance and bone health    . escitalopram (LEXAPRO) 5 MG tablet Take 1 tablet (5 mg total) by mouth daily. 30 tablet 2  . fenofibrate micronized (LOFIBRA) 134 MG capsule TAKE (1) CAPSULE DAILY BEFORE BREAKFAST. 90 capsule 0  . fish oil-omega-3 fatty acids 1000 MG capsule Take 1,200 mg by mouth daily.     . furosemide (LASIX) 20 MG tablet Take 1 tablet (20 mg total) by mouth every morning. Note change in dose from 2/day to 1/day (profile) 30 tablet 2  . gabapentin (NEURONTIN) 600 MG tablet Take 1 tablet (600 mg total) by mouth 3 (three) times daily. 90 tablet 0  . levothyroxine (SYNTHROID, LEVOTHROID) 125 MCG tablet TAKE 1 TABLET DAILY 30 tablet 10  . linaclotide (LINZESS) 145 MCG CAPS capsule Take 1 capsule (145 mcg total) by mouth daily. To regulate bowel movements 30 capsule 5  . NITROSTAT 0.4 MG SL tablet Place 1 tablet (0.4 mg total) under the tongue every 5 (five) minutesas needed for chest pain. 25 tablet 0  . polyethylene glycol powder (GLYCOLAX/MIRALAX) powder Take 17 g by mouth daily as needed. For constipation 3350 g 5  . traZODone (DESYREL) 150 MG tablet Use from 1/3 to 1 tablet nightly as needed for sleep. 30 tablet 5  . Trolamine Salicylate (ASPERCREME) 10 % LOTN Apply 1 application topically 2 (two) times daily as needed. (Patient  taking differently: Apply 1 application topically 2 (two) times daily as needed (itching/rash). ) 141 g 0  . warfarin (COUMADIN) 5 MG tablet Take 5 mg once daily except on Monday and Friday take 7.5 mg. 32 tablet 2   No current facility-administered medications on file prior to visit.     ROS Review of Systems  Constitutional: Positive for activity change. Negative for appetite change, chills and fever.  HENT: Negative.   Eyes: Negative.   Respiratory: Negative for cough, shortness of breath and wheezing.   Cardiovascular: Negative for  chest pain.  Gastrointestinal: Negative for abdominal pain.  Genitourinary: Negative for dysuria.  Musculoskeletal: Positive for arthralgias (see history of pres).  Skin: Negative for wound.    Objective:  BP 96/65   Pulse (!) 141   Temp 97.6 F (36.4 C) (Oral)   Ht 5\' 6"  (1.676 m)   Wt 164 lb (74.4 kg)   BMI 26.47 kg/m   Physical Exam  Constitutional: He is oriented to person, place, and time. He appears well-developed and well-nourished.  HENT:  Head: Normocephalic and atraumatic.  Right Ear: External ear normal.  Left Ear: External ear normal.  Mouth/Throat: No oropharyngeal exudate or posterior oropharyngeal erythema.  Eyes: Pupils are equal, round, and reactive to light.  Neck: Normal range of motion. Neck supple.  Cardiovascular: Normal rate and regular rhythm.   No murmur heard. Pulmonary/Chest: Breath sounds normal. No respiratory distress.  Abdominal: There is no tenderness.  Musculoskeletal: He exhibits edema and tenderness.  The left knee has full range of motion but with some guarding from discomfort, actively and passively. Gait is with a limp. The joint lines are anterior medial and laterally tender. The patella is palpable, without being ballotable. However there is 2+ effusion. Lachman / anterior drawer signs are negative for signs of instability and pain free. McMurray testing reveals no pop or excessive discomfort. Varus and valgus stress maneuvers do not cause ligamentous stretch or instability.   Neurological: He is alert and oriented to person, place, and time.  Vitals reviewed.   Assessment & Plan:   Makoy was seen today for knee pain.  Diagnoses and all orders for this visit:  Arthritis of left knee  Effusion of left knee -     DG Knee 1-2 Views Left; Future  X-ray shows arthritic change and enchondroma I have discontinued Mr. Divelbiss HYDROcodone-homatropine. I am also having him maintain his fish oil-omega-3 fatty acids, CALCIUM PO, NITROSTAT,  cholecalciferol, acetaminophen, furosemide, traZODone, Trolamine Salicylate, carvedilol, amLODipine, escitalopram, fenofibrate micronized, levothyroxine, polyethylene glycol powder, linaclotide, warfarin, benzonatate, and gabapentin.  No orders of the defined types were placed in this encounter.  A steroid injection was performed at the lateral aspect of left knee joint using  and 6 mg of Celestone. This was well tolerated.   Follow-up: No Follow-up on file.  Claretta Fraise, M.D.

## 2016-08-06 ENCOUNTER — Ambulatory Visit (INDEPENDENT_AMBULATORY_CARE_PROVIDER_SITE_OTHER): Payer: Medicare Other | Admitting: Family Medicine

## 2016-08-06 ENCOUNTER — Encounter: Payer: Self-pay | Admitting: Family Medicine

## 2016-08-06 VITALS — BP 119/68 | HR 81 | Temp 97.2°F | Ht 66.0 in | Wt 168.0 lb

## 2016-08-06 DIAGNOSIS — R413 Other amnesia: Secondary | ICD-10-CM

## 2016-08-06 DIAGNOSIS — R351 Nocturia: Secondary | ICD-10-CM

## 2016-08-06 DIAGNOSIS — M1712 Unilateral primary osteoarthritis, left knee: Secondary | ICD-10-CM

## 2016-08-06 DIAGNOSIS — N401 Enlarged prostate with lower urinary tract symptoms: Secondary | ICD-10-CM | POA: Diagnosis not present

## 2016-08-06 MED ORDER — TAMSULOSIN HCL 0.4 MG PO CAPS: 0.8000 mg | ORAL_CAPSULE | Freq: Every day | ORAL | 3 refills | Status: AC

## 2016-08-06 MED ORDER — PREDNISONE 10 MG (48) PO TBPK: ORAL_TABLET | Freq: Every day | ORAL | 0 refills | Status: DC

## 2016-08-06 MED ORDER — DONEPEZIL HCL 5 MG PO TABS: 5.0000 mg | ORAL_TABLET | Freq: Every day | ORAL | 2 refills | Status: DC

## 2016-08-06 NOTE — Progress Notes (Signed)
Subjective:  Patient ID: [name], male    DOB: Feb 15, 1926  Age: 82 y.o. MRN: [id number]  CC: Knee Pain (pt here today for left knee pain )   HPI [name] presents for continued swelling and pain in the left knee. He has no known injury. He did do a lot of sitting and standing, up and down canning cabbage two days ago. He does have previous evaluation showing arthritis. He also has a history of a intra-articular chondroma that is calcified on MRI. He can ambulate still using cane. The pain does not radiate. It is moderately severe. It does interfere with his activities.  The patient also has difficulty with getting up to urinate every hour during the night. During the day he can go as long as 2 hours.  Patient says is getting very forgetful and needs help with remembering things. Today he does not remember having had a steroid injection into the knee last week. He said he is getting more like that with other things. He also doesn't remember what her lab is although he's been a patient here for many many years and has had multiple lab tests ordered.    History [name] has a past medical history of Atrial fibrillation ([address]); CAD (coronary artery disease); COPD (chronic obstructive pulmonary disease) ([place]); Dilated cardiomyopathy ([place]); Drug therapy; Dyslipidemia; Hypertension; and Hypothyroidism.   He has a past surgical history that includes Hip surgery (Left); Cardiac catheterization (05/2007); and Coronary stent placement.   His family history includes Diabetes in his other; Heart attack in his father and mother; Stroke in his other. He was adopted.He reports that he has never smoked. He has never used smokeless tobacco. He reports that he does not drink alcohol or use drugs.  Current Outpatient Prescriptions on File Prior to Visit  Medication Sig Dispense Refill  . acetaminophen (TYLENOL) 500 MG tablet Take 1-2 tablets (500-1,000 mg total) by mouth 2 (two) times daily as needed.  (Patient taking differently: Take 500-1,000 mg by mouth 2 (two) times daily as needed for mild pain. ) 30 tablet 0  . amLODipine (NORVASC) 10 MG tablet TAKE 1 TABLET DAILY 30 tablet 10  . benzonatate (TESSALON) 200 MG capsule TAKE 1 CAPSULE THREE TIMES DAILY AS NEEDED FOR COUGH 20 capsule 0  . CALCIUM PO Take 630 mg by mouth daily. Each tablet is 315mg  - take 2 tablets once daily in the evening    . carvedilol (COREG) 12.5 MG tablet 1/2 each morning and 1 each evening 60 tablet 5  . cholecalciferol (VITAMIN D) 1000 units tablet Take 1,000 Units by mouth daily. Helps with balance and bone health    . escitalopram (LEXAPRO) 5 MG tablet Take 1 tablet (5 mg total) by mouth daily. 30 tablet 2  . fenofibrate micronized (LOFIBRA) 134 MG capsule TAKE (1) CAPSULE DAILY BEFORE BREAKFAST. 90 capsule 0  . fish oil-omega-3 fatty acids 1000 MG capsule Take 1,200 mg by mouth daily.     . furosemide (LASIX) 20 MG tablet Take 1 tablet (20 mg total) by mouth every morning. Note change in dose from 2/day to 1/day (profile) 30 tablet 2  . gabapentin (NEURONTIN) 600 MG tablet Take 1 tablet (600 mg total) by mouth 3 (three) times daily. 90 tablet 0  . levothyroxine (SYNTHROID, LEVOTHROID) 125 MCG tablet TAKE 1 TABLET DAILY 30 tablet 10  . linaclotide (LINZESS) 145 MCG CAPS capsule Take 1 capsule (145 mcg total) by mouth daily. To regulate bowel movements 30 capsule 5  .  NITROSTAT 0.4 MG SL tablet Place 1 tablet (0.4 mg total) under the tongue every 5 (five) minutesas needed for chest pain. 25 tablet 0  . polyethylene glycol powder (GLYCOLAX/MIRALAX) powder Take 17 g by mouth daily as needed. For constipation 3350 g 5  . traZODone (DESYREL) 150 MG tablet Use from 1/3 to 1 tablet nightly as needed for sleep. 30 tablet 5  . Trolamine Salicylate (ASPERCREME) 10 % LOTN Apply 1 application topically 2 (two) times daily as needed. (Patient taking differently: Apply 1 application topically 2 (two) times daily as needed  (itching/rash). ) 141 g 0  . warfarin (COUMADIN) 5 MG tablet Take 5 mg once daily except on Monday and Friday take 7.5 mg. 32 tablet 2   No current facility-administered medications on file prior to visit.     ROS Review of Systems  Constitutional: Positive for activity change. Negative for appetite change, chills, diaphoresis, fever and unexpected weight change.  HENT: Negative.  Negative for congestion, hearing loss, rhinorrhea and sore throat.   Eyes: Negative.  Negative for visual disturbance.  Respiratory: Negative for cough, shortness of breath and wheezing.   Cardiovascular: Negative for chest pain.  Gastrointestinal: Negative for abdominal pain, constipation and diarrhea.  Genitourinary: Negative for dysuria and flank pain.  Musculoskeletal: Positive for arthralgias (see history of pres). Negative for joint swelling.  Skin: Negative for rash and wound.  Neurological: Negative for dizziness and headaches.  Psychiatric/Behavioral: Positive for confusion. Negative for dysphoric mood and sleep disturbance.    Objective:  BP 119/68   Pulse 81   Temp 97.2 F (36.2 C) (Oral)   Ht 5\' 6"  (1.676 m)   Wt 168 lb (76.2 kg)   BMI 27.12 kg/m   Physical Exam  Constitutional: He is oriented to person, place, and time. He appears well-developed and well-nourished. No distress.  HENT:  Head: Normocephalic and atraumatic.  Right Ear: External ear normal.  Left Ear: External ear normal.  Nose: Nose normal.  Mouth/Throat: Oropharynx is clear and moist. No oropharyngeal exudate or posterior oropharyngeal erythema.  Eyes: Conjunctivae and EOM are normal. Pupils are equal, round, and reactive to light.  Neck: Normal range of motion. Neck supple. No thyromegaly present.  Cardiovascular: Normal rate, regular rhythm and normal heart sounds.   No murmur heard. Pulmonary/Chest: Effort normal and breath sounds normal. No respiratory distress. He has no wheezes. He has no rales.  Abdominal: Soft.  Bowel sounds are normal. He exhibits no distension. There is no tenderness.  Musculoskeletal: He exhibits edema and tenderness.  The left knee has full range of motion but with some guarding from discomfort, actively and passively. Gait is with a limp. The joint lines are anterior medial and laterally tender. The patella is palpable, without being ballotable. However there is 2+ effusion. Lachman / anterior drawer signs are negative for signs of instability and pain free. McMurray testing reveals no pop or excessive discomfort. Varus and valgus stress maneuvers do not cause ligamentous stretch or instability.   Lymphadenopathy:    He has no cervical adenopathy.  Neurological: He is alert and oriented to person, place, and time. He has normal reflexes.  Skin: Skin is warm and dry.  Psychiatric: He has a normal mood and affect. His behavior is normal. Judgment and thought content normal.  Vitals reviewed.   Assessment & Plan:   Bashir was seen today for knee pain.  Diagnoses and all orders for this visit:  Arthritis of left knee  Benign prostatic hyperplasia with  nocturia -     Urinalysis, Complete  Memory loss or impairment  Other orders -     predniSONE (STERAPRED UNI-PAK 48 TAB) 10 MG (48) TBPK tablet; Take by mouth daily. For knee pain -     donepezil (ARICEPT) 5 MG tablet; Take 1 tablet (5 mg total) by mouth at bedtime. To help with memory and to help think more clearly -     tamsulosin (FLOMAX) 0.4 MG CAPS capsule; Take 2 capsules (0.8 mg total) by mouth daily after supper. For urine flow and prostate  X-ray shows arthritic change and enchondroma I am having Mr. Howington start on predniSONE, donepezil, and tamsulosin. I am also having him maintain his fish oil-omega-3 fatty acids, CALCIUM PO, NITROSTAT, cholecalciferol, acetaminophen, furosemide, traZODone, Trolamine Salicylate, carvedilol, amLODipine, escitalopram, fenofibrate micronized, levothyroxine, polyethylene glycol powder,  linaclotide, warfarin, benzonatate, and gabapentin.  Meds ordered this encounter  Medications  . predniSONE (STERAPRED UNI-PAK 48 TAB) 10 MG (48) TBPK tablet    Sig: Take by mouth daily. For knee pain    Dispense:  48 tablet    Refill:  0  . donepezil (ARICEPT) 5 MG tablet    Sig: Take 1 tablet (5 mg total) by mouth at bedtime. To help with memory and to help think more clearly    Dispense:  30 tablet    Refill:  2  . tamsulosin (FLOMAX) 0.4 MG CAPS capsule    Sig: Take 2 capsules (0.8 mg total) by mouth daily after supper. For urine flow and prostate    Dispense:  60 capsule    Refill:  3   A steroid injection was performed at the lateral aspect of left knee joint using  and 6 mg of Celestone. This was well tolerated.   Follow-up: Return in about 1 month (around 09/06/2016).  Claretta Fraise, M.D.

## 2016-08-09 ENCOUNTER — Other Ambulatory Visit: Payer: Self-pay

## 2016-08-09 NOTE — Patient Outreach (Addendum)
[facility]) Care Management  08/09/2016  [name] Oct 18, 1925 [id number]  Telephonic Screening   Referral Date:  08/08/2016 Source:  Dr. Claretta Fraise, [facility], contact:  Glean Salen [phone] Issue:  HF. Hospital admission x 7 (2017).  Really fast deconditioning; increased falls and decreased alertness.  [facility] Patient is primary contact Insurance:  [employer] Medicare  MR Review: Providers: Primary MD:  Dr. Claretta Fraise, [facility] - last appt 08/06/16 Co-morbidities:  Chronic combined systolic and diastolic CHF,  COPD, CAD, Paroxysmal atrial fibrillation, Dilated cardiomyopathy, EF 40%, A-fib, HTN, Hypothyroidism, Chronic Coumadin therapy, Dyslipidemia, Hypertension Arthritis of left knee with pain and swelling with h/o intraarticular chondroma; steroid injection 08/06/16.  Prednisone 10//27/17 Benign prostatic hyperplasia with nocturia with frequent urination during the night about ever 1 hour - tamsulosin/Flomax 08/09/16 Memory issues donepezil/Aricept 08/09/16 Admissions 1 -04/01/2016 - 04/03/2016  - Acute on chronic combined systolic and diastolic CHF -[id number]  03/28/2015 - [facility] ED visits 3   -07/03/16 weakness following starting two new medications for BPH -05/01/16 constant, mild, generalized body aches, worse in the knees and hips onset last night. -04/01/16 ED to Hospital admission   Outreach call #1 to patient.  Patient reached but hung up the phone.   RN CM called back but patient hung up again.  RN CM scheduled for next contact call within one week.   [name], BSN, RN, [place] Care Management Care Management Coordinator [phone] Direct [phone] Cell [phone] Office [phone] Fax [name]

## 2016-08-13 ENCOUNTER — Ambulatory Visit: Payer: Self-pay

## 2016-08-14 ENCOUNTER — Other Ambulatory Visit: Payer: Self-pay

## 2016-08-14 NOTE — Patient Outreach (Signed)
[name]) Care Management  08/14/2016  [name] May 17, 1926 [id number]  Telephonic Screening   Referral Date:  08/08/2016 Source:  Dr. Claretta Fraise, [facility], contact:  Glean Salen [phone] Issue:  HF. Hospital admission x 7 (2017).  Really fast deconditioning; increased falls and decreased alertness.  [facility] Patient is primary contact Insurance:  UHC/AARP Medicare  MR Review: Providers: Primary MD:  Dr. Claretta Fraise, [facility] - last appt 08/06/16 Co-morbidities:  Chronic combined systolic and diastolic CHF,  COPD, CAD, Paroxysmal atrial fibrillation, Dilated cardiomyopathy, EF 40%, A-fib, HTN, Hypothyroidism, Chronic Coumadin therapy, Dyslipidemia, Hypertension Arthritis of left knee with pain and swelling with h/o intraarticular chondroma; steroid injection 08/06/16.  Prednisone 10//27/17 Benign prostatic hyperplasia with nocturia with frequent urination during the night about ever 1 hour - tamsulosin/Flomax 08/09/16 Memory issues donepezil/Aricept 08/09/16 Admissions 1 -04/01/2016 - 04/03/2016  - Acute on chronic combined systolic and diastolic CHF -[id number]  03/28/2015 - [facility] ED visits 3   -07/03/16 weakness following starting two new medications for BPH -05/01/16 constant, mild, generalized body aches, worse in the knees and hips onset last night. -04/01/16 ED to Hospital admission   Outreach call #2 to patient.   Patient not reached.  Outgoing message but RN CM unable to leave message; phone system not working.  RN CM scheduled for next contact call within one week.   Nathaneil Canary, BSN, RN, [place] Management Care Management Coordinator [phone] Direct [phone] Cell [phone] Office [phone] Fax [email or web address]

## 2016-08-19 ENCOUNTER — Ambulatory Visit: Payer: Self-pay

## 2016-08-20 ENCOUNTER — Ambulatory Visit: Payer: Self-pay

## 2016-08-20 ENCOUNTER — Other Ambulatory Visit: Payer: Self-pay | Admitting: Family Medicine

## 2016-08-21 ENCOUNTER — Other Ambulatory Visit: Payer: Self-pay

## 2016-08-21 NOTE — Patient Outreach (Signed)
[facility]) Care Management  08/21/2016  Jaylan Servatius 29-Jan-1926 [id number]  Telephonic Screening   Referral Date:  08/08/2016 Source:  Dr. Claretta Fraise, [facility], contact:  Glean Salen [phone] Issue:  HF. Hospital admission x 7 (2017).  Really fast deconditioning; increased falls and decreased alertness.  [facility] Patient is primary contact Insurance:  UHC/AARP Medicare  MR Review: Providers: Primary MD:  Dr. Claretta Fraise, [facility] - last appt 08/06/16 Co-morbidities:  Chronic combined systolic and diastolic CHF,  COPD, CAD, Paroxysmal atrial fibrillation, Dilated cardiomyopathy, EF 40%, A-fib, HTN, Hypothyroidism, Chronic Coumadin therapy, Dyslipidemia, Hypertension Arthritis of left knee with pain and swelling with h/o intraarticular chondroma; steroid injection 08/06/16.  Prednisone 10//27/17 Benign prostatic hyperplasia with nocturia with frequent urination during the night about ever 1 hour - tamsulosin/Flomax 08/09/16 Memory issues donepezil/Aricept 08/09/16 Admissions 1 -04/01/2016 - 04/03/2016  - Acute on chronic combined systolic and diastolic CHF -[id number]  03/28/2015 - [facility] ED visits 3   -07/03/16 weakness following starting two new medications for BPH -05/01/16 constant, mild, generalized body aches, worse in the knees and hips onset last night. -04/01/16 ED to Hospital admission   Outreach call #3 to patient.   Patient reached but hung up the phone.   Plan:  RN CM mailed unsuccessful outreach letter.  RN CM will close case if no response received back from patient within 2 weeks.   Nathaneil Canary, BSN, RN, [place] Management Care Management Coordinator [phone] Direct [phone] Cell [phone] Office [phone] Fax [email or web address]

## 2016-08-22 ENCOUNTER — Other Ambulatory Visit: Payer: Self-pay | Admitting: Pharmacist

## 2016-08-22 DIAGNOSIS — I5043 Acute on chronic combined systolic (congestive) and diastolic (congestive) heart failure: Secondary | ICD-10-CM

## 2016-08-27 ENCOUNTER — Emergency Department (HOSPITAL_COMMUNITY): Payer: Medicare Other

## 2016-08-27 ENCOUNTER — Observation Stay (HOSPITAL_COMMUNITY)
Admission: EM | Admit: 2016-08-27 | Discharge: 2016-08-28 | Disposition: A | Discharge status: Home / Self Care | Payer: Medicare Other | Attending: Internal Medicine | Admitting: Internal Medicine

## 2016-08-27 ENCOUNTER — Encounter (HOSPITAL_COMMUNITY): Payer: Self-pay

## 2016-08-27 DIAGNOSIS — K8010 Calculus of gallbladder with chronic cholecystitis without obstruction: Principal | ICD-10-CM

## 2016-08-27 DIAGNOSIS — K8020 Calculus of gallbladder without cholecystitis without obstruction: Secondary | ICD-10-CM | POA: Diagnosis present

## 2016-08-27 DIAGNOSIS — I110 Hypertensive heart disease with heart failure: Secondary | ICD-10-CM | POA: Insufficient documentation

## 2016-08-27 DIAGNOSIS — N3289 Other specified disorders of bladder: Secondary | ICD-10-CM | POA: Diagnosis present

## 2016-08-27 DIAGNOSIS — Z7901 Long term (current) use of anticoagulants: Secondary | ICD-10-CM | POA: Insufficient documentation

## 2016-08-27 DIAGNOSIS — R0602 Shortness of breath: Secondary | ICD-10-CM | POA: Diagnosis not present

## 2016-08-27 DIAGNOSIS — Z79899 Other long term (current) drug therapy: Secondary | ICD-10-CM | POA: Diagnosis not present

## 2016-08-27 DIAGNOSIS — R1084 Generalized abdominal pain: Secondary | ICD-10-CM | POA: Diagnosis present

## 2016-08-27 DIAGNOSIS — I5042 Chronic combined systolic (congestive) and diastolic (congestive) heart failure: Secondary | ICD-10-CM | POA: Insufficient documentation

## 2016-08-27 DIAGNOSIS — R1011 Right upper quadrant pain: Secondary | ICD-10-CM | POA: Diagnosis not present

## 2016-08-27 DIAGNOSIS — I4891 Unspecified atrial fibrillation: Secondary | ICD-10-CM | POA: Diagnosis not present

## 2016-08-27 DIAGNOSIS — F0390 Unspecified dementia without behavioral disturbance: Secondary | ICD-10-CM | POA: Diagnosis present

## 2016-08-27 DIAGNOSIS — I4819 Other persistent atrial fibrillation: Secondary | ICD-10-CM

## 2016-08-27 DIAGNOSIS — R351 Nocturia: Secondary | ICD-10-CM

## 2016-08-27 DIAGNOSIS — E039 Hypothyroidism, unspecified: Secondary | ICD-10-CM | POA: Diagnosis present

## 2016-08-27 DIAGNOSIS — I480 Paroxysmal atrial fibrillation: Secondary | ICD-10-CM | POA: Diagnosis present

## 2016-08-27 DIAGNOSIS — I2510 Atherosclerotic heart disease of native coronary artery without angina pectoris: Secondary | ICD-10-CM | POA: Diagnosis present

## 2016-08-27 DIAGNOSIS — R0789 Other chest pain: Secondary | ICD-10-CM | POA: Diagnosis not present

## 2016-08-27 DIAGNOSIS — E785 Hyperlipidemia, unspecified: Secondary | ICD-10-CM | POA: Diagnosis present

## 2016-08-27 DIAGNOSIS — J449 Chronic obstructive pulmonary disease, unspecified: Secondary | ICD-10-CM | POA: Diagnosis not present

## 2016-08-27 DIAGNOSIS — N401 Enlarged prostate with lower urinary tract symptoms: Secondary | ICD-10-CM | POA: Diagnosis present

## 2016-08-27 DIAGNOSIS — R1013 Epigastric pain: Secondary | ICD-10-CM | POA: Diagnosis not present

## 2016-08-27 DIAGNOSIS — F0150 Vascular dementia without behavioral disturbance: Secondary | ICD-10-CM

## 2016-08-27 DIAGNOSIS — I5022 Chronic systolic (congestive) heart failure: Secondary | ICD-10-CM | POA: Diagnosis present

## 2016-08-27 DIAGNOSIS — R109 Unspecified abdominal pain: Secondary | ICD-10-CM

## 2016-08-27 DIAGNOSIS — Z955 Presence of coronary angioplasty implant and graft: Secondary | ICD-10-CM | POA: Insufficient documentation

## 2016-08-27 DIAGNOSIS — K811 Chronic cholecystitis: Secondary | ICD-10-CM

## 2016-08-27 DIAGNOSIS — K8018 Calculus of gallbladder with other cholecystitis without obstruction: Secondary | ICD-10-CM | POA: Diagnosis not present

## 2016-08-27 DIAGNOSIS — R079 Chest pain, unspecified: Secondary | ICD-10-CM | POA: Diagnosis present

## 2016-08-27 DIAGNOSIS — G609 Hereditary and idiopathic neuropathy, unspecified: Secondary | ICD-10-CM | POA: Diagnosis present

## 2016-08-27 DIAGNOSIS — Z01810 Encounter for preprocedural cardiovascular examination: Secondary | ICD-10-CM

## 2016-08-27 HISTORY — DX: Cholecystitis, unspecified: K81.9

## 2016-08-27 LAB — CBC
HCT: 43.8 % (ref 39.0–52.0)
Hemoglobin: 14.2 g/dL (ref 13.0–17.0)
MCH: 25.6 pg — ABNORMAL LOW (ref 26.0–34.0)
MCHC: 32.4 g/dL (ref 30.0–36.0)
MCV: 78.9 fL (ref 78.0–100.0)
PLATELETS: 250 10*3/uL (ref 150–400)
RBC: 5.55 MIL/uL (ref 4.22–5.81)
RDW: 18.2 % — ABNORMAL HIGH (ref 11.5–15.5)
WBC: 7.7 10*3/uL (ref 4.0–10.5)

## 2016-08-27 LAB — DIFFERENTIAL
BASOS ABS: 0 10*3/uL (ref 0.0–0.1)
BASOS PCT: 0 %
Eosinophils Absolute: 0.2 10*3/uL (ref 0.0–0.7)
Eosinophils Relative: 3 %
Lymphocytes Relative: 19 %
Lymphs Abs: 1.4 10*3/uL (ref 0.7–4.0)
MONOS PCT: 6 %
Monocytes Absolute: 0.4 10*3/uL (ref 0.1–1.0)
NEUTROS ABS: 5.4 10*3/uL (ref 1.7–7.7)
NEUTROS PCT: 72 %

## 2016-08-27 LAB — BASIC METABOLIC PANEL
Anion gap: 8 (ref 5–15)
BUN: 24 mg/dL — ABNORMAL HIGH (ref 6–20)
CHLORIDE: 108 mmol/L (ref 101–111)
CO2: 23 mmol/L (ref 22–32)
CREATININE: 1.19 mg/dL (ref 0.61–1.24)
Calcium: 8.8 mg/dL — ABNORMAL LOW (ref 8.9–10.3)
GFR calc non Af Amer: 53 mL/min — ABNORMAL LOW (ref >60)
Glucose, Bld: 143 mg/dL — ABNORMAL HIGH (ref 65–99)
POTASSIUM: 4.1 mmol/L (ref 3.5–5.1)
Sodium: 139 mmol/L (ref 135–145)

## 2016-08-27 LAB — LIPASE, BLOOD: LIPASE: 25 U/L (ref 11–51)

## 2016-08-27 LAB — HEPATIC FUNCTION PANEL
ALBUMIN: 3.8 g/dL (ref 3.5–5.0)
ALK PHOS: 38 U/L (ref 38–126)
ALT: 16 U/L — ABNORMAL LOW (ref 17–63)
AST: 23 U/L (ref 15–41)
Bilirubin, Direct: 0.3 mg/dL (ref 0.1–0.5)
Indirect Bilirubin: 0.4 mg/dL (ref 0.3–0.9)
TOTAL PROTEIN: 6.4 g/dL — AB (ref 6.5–8.1)
Total Bilirubin: 0.7 mg/dL (ref 0.3–1.2)

## 2016-08-27 LAB — TROPONIN I: Troponin I: <0.03 ng/mL (ref <0.03)

## 2016-08-27 LAB — SEDIMENTATION RATE: Sed Rate: 1 mm/hr (ref 0–16)

## 2016-08-27 LAB — I-STAT TROPONIN, ED: Troponin i, poc: 0 ng/mL (ref 0.00–0.08)

## 2016-08-27 LAB — I-STAT CG4 LACTIC ACID, ED: LACTIC ACID, VENOUS: 1.64 mmol/L (ref 0.5–1.9)

## 2016-08-27 LAB — BRAIN NATRIURETIC PEPTIDE: B NATRIURETIC PEPTIDE 5: 364.7 pg/mL — AB (ref 0.0–100.0)

## 2016-08-27 LAB — PROTIME-INR
INR: 1.51
PROTHROMBIN TIME: 18.3 s — AB (ref 11.4–15.2)

## 2016-08-27 MED ORDER — FENOFIBRATE 160 MG PO TABS
160.0000 mg | ORAL_TABLET | Freq: Every day | ORAL | Status: DC
  Administered 2016-08-28: 160 mg via ORAL
  Filled 2016-08-27: qty 1

## 2016-08-27 MED ORDER — FAMOTIDINE IN NACL 20-0.9 MG/50ML-% IV SOLN
20.0000 mg | Freq: Two times a day (BID) | INTRAVENOUS | Status: DC
  Administered 2016-08-27 – 2016-08-28 (×3): 20 mg via INTRAVENOUS
  Filled 2016-08-27 (×3): qty 50

## 2016-08-27 MED ORDER — SODIUM CHLORIDE 0.9 % IV BOLUS (SEPSIS)
500.0000 mL | Freq: Once | INTRAVENOUS | Status: AC
  Administered 2016-08-27: 500 mL via INTRAVENOUS

## 2016-08-27 MED ORDER — WARFARIN SODIUM 7.5 MG PO TABS
7.5000 mg | ORAL_TABLET | Freq: Once | ORAL | Status: AC
  Administered 2016-08-27: 7.5 mg via ORAL
  Filled 2016-08-27 (×2): qty 1

## 2016-08-27 MED ORDER — CARVEDILOL 12.5 MG PO TABS
12.5000 mg | ORAL_TABLET | Freq: Two times a day (BID) | ORAL | Status: DC
  Administered 2016-08-27 – 2016-08-28 (×2): 12.5 mg via ORAL
  Filled 2016-08-27 (×2): qty 1

## 2016-08-27 MED ORDER — WARFARIN - PHARMACIST DOSING INPATIENT: Freq: Every day | Status: DC

## 2016-08-27 MED ORDER — ESCITALOPRAM OXALATE 10 MG PO TABS
5.0000 mg | ORAL_TABLET | Freq: Every day | ORAL | Status: DC
  Administered 2016-08-27 – 2016-08-28 (×2): 5 mg via ORAL
  Filled 2016-08-27 (×2): qty 1

## 2016-08-27 MED ORDER — MORPHINE SULFATE (PF) 4 MG/ML IV SOLN: 4.0000 mg | Freq: Once | INTRAVENOUS | Status: DC

## 2016-08-27 MED ORDER — LEVOTHYROXINE SODIUM 100 MCG PO TABS
125.0000 ug | ORAL_TABLET | Freq: Every day | ORAL | Status: DC
  Administered 2016-08-28: 125 ug via ORAL
  Filled 2016-08-27: qty 1

## 2016-08-27 MED ORDER — FUROSEMIDE 20 MG PO TABS
20.0000 mg | ORAL_TABLET | Freq: Every day | ORAL | Status: DC
  Administered 2016-08-27 – 2016-08-28 (×2): 20 mg via ORAL
  Filled 2016-08-27 (×2): qty 1

## 2016-08-27 MED ORDER — GI COCKTAIL ~~LOC~~
30.0000 mL | Freq: Once | ORAL | Status: DC
  Filled 2016-08-27: qty 30

## 2016-08-27 MED ORDER — TAMSULOSIN HCL 0.4 MG PO CAPS
0.8000 mg | ORAL_CAPSULE | Freq: Every day | ORAL | Status: DC
  Administered 2016-08-27: 0.8 mg via ORAL
  Filled 2016-08-27: qty 2

## 2016-08-27 MED ORDER — TRAZODONE HCL 50 MG PO TABS
50.0000 mg | ORAL_TABLET | Freq: Every day | ORAL | Status: DC
  Administered 2016-08-27: 50 mg via ORAL
  Filled 2016-08-27: qty 1

## 2016-08-27 MED ORDER — MORPHINE SULFATE (PF) 4 MG/ML IV SOLN
4.0000 mg | Freq: Once | INTRAVENOUS | Status: AC
  Administered 2016-08-27: 4 mg via INTRAVENOUS
  Filled 2016-08-27: qty 1

## 2016-08-27 MED ORDER — LINACLOTIDE 145 MCG PO CAPS
145.0000 ug | ORAL_CAPSULE | Freq: Every day | ORAL | Status: DC
  Filled 2016-08-27: qty 1

## 2016-08-27 MED ORDER — OXYCODONE HCL 5 MG PO TABS: 5.0000 mg | ORAL_TABLET | ORAL | Status: DC | PRN

## 2016-08-27 MED ORDER — NITROGLYCERIN 0.4 MG SL SUBL
0.4000 mg | SUBLINGUAL_TABLET | SUBLINGUAL | Status: DC | PRN
  Administered 2016-08-27: 0.4 mg via SUBLINGUAL
  Filled 2016-08-27: qty 1

## 2016-08-27 MED ORDER — GABAPENTIN 600 MG PO TABS
600.0000 mg | ORAL_TABLET | Freq: Three times a day (TID) | ORAL | Status: DC
Start: 2016-08-27 — End: 2016-08-28
  Administered 2016-08-27 – 2016-08-28 (×3): 600 mg via ORAL
  Filled 2016-08-27 (×3): qty 1

## 2016-08-27 MED ORDER — AMLODIPINE BESYLATE 10 MG PO TABS
10.0000 mg | ORAL_TABLET | Freq: Every day | ORAL | Status: DC
  Administered 2016-08-27 – 2016-08-28 (×2): 10 mg via ORAL
  Filled 2016-08-27 (×2): qty 1

## 2016-08-27 MED ORDER — DONEPEZIL HCL 5 MG PO TABS
5.0000 mg | ORAL_TABLET | Freq: Every day | ORAL | Status: DC
  Administered 2016-08-27: 5 mg via ORAL
  Filled 2016-08-27: qty 1

## 2016-08-27 MED ORDER — SODIUM CHLORIDE 0.9 % IV BOLUS (SEPSIS): 1000.0000 mL | Freq: Once | INTRAVENOUS | Status: DC

## 2016-08-27 MED ORDER — IOPAMIDOL (ISOVUE-370) INJECTION 76%
INTRAVENOUS | Status: AC
  Administered 2016-08-27: 100 mL
  Filled 2016-08-27: qty 100

## 2016-08-27 MED ORDER — ACETAMINOPHEN 325 MG PO TABS: 650.0000 mg | ORAL_TABLET | ORAL | Status: DC | PRN

## 2016-08-27 MED ORDER — ONDANSETRON HCL 4 MG/2ML IJ SOLN: 4.0000 mg | Freq: Four times a day (QID) | INTRAMUSCULAR | Status: DC | PRN

## 2016-08-27 NOTE — ED Triage Notes (Signed)
Pt presents via [facility] for evaluation of substernal CP with radiation to upper abd. Pt. Called EMS for SOB today, states he did not sleep through night. Given 324 ASA, 1 Nitro in route. Pt. Reports hx of stent.

## 2016-08-27 NOTE — ED Provider Notes (Signed)
[facility] DEPT Provider Note   CSN: [id number] Arrival date & time: 08/27/16  [id number]     History   Chief Complaint Chief Complaint  Patient presents with  . Chest Pain    HPI [name] is a 82 y.o. male.  The history is provided by the patient.  Chest Pain    82 year old male with a history of CAD status post stents, dilated cardiomyopathy, combined diastolic/systolic heart failure, atrial fibrillation on anticoagulation who presents to the ED with worsening pressure-like chest pain that is radiating to the back with associated shortness of breath. Patient also endorses diffuse abdominal pain that has been persistent in the same timeframe. Endorses nausea but denies any emesis or diarrhea. No urinary sx. Denies fever, chills, cough.  On reviewing her records, note the patient had previous CT of the abdomen that showed cholelithiasis.  Past Medical History:  Diagnosis Date  . Atrial fibrillation (HCC)    Persistent, treated with amiodarone and cardioversion  . CAD (coronary artery disease)    Last catheterization in August 2008 demonstrated the LAD 40% stenosis  . COPD (chronic obstructive pulmonary disease) ([place])   . Dilated cardiomyopathy ([place])    Felt to be secondary to atrial fibrillation. EF is about 40%  . Drug therapy    Chronic Coumadin therapy  . Dyslipidemia   . Hypertension   . Hypothyroidism    Possibly related to amiodarone    Patient Active Problem List   Diagnosis Date Noted  . Acute epigastric pain 08/27/2016  . Dementia 08/27/2016  . Cholelithiasis 08/27/2016  . Bladder wall thickening 08/27/2016  . Effusion of left knee 07/30/2016  . Arthritis of left knee 07/30/2016  . Chronic combined systolic and diastolic heart failure, NYHA class 1 -EF 35-40% ECHO 6/17 04/01/2016  . Insomnia 03/06/2016  . Dyslipidemia 12/26/2015  . Fecal soiling due to fecal incontinence 09/12/2015  . BPH (benign prostatic hyperplasia) 08/02/2015  . Arthritis, hip  08/02/2015  . At high risk for falls 07/14/2015  . Hereditary and idiopathic peripheral neuropathy 03/06/2015  . Pre-diabetes 12/23/2014  . Hypothyroid 11/18/2013  . Right sided weakness 10/05/2013  . Long term current use of anticoagulant therapy 10/05/2013  . Paresthesias 10/05/2013  . Paroxysmal atrial fibrillation ([place]) 01/25/2013  . Tingling 11/20/2011  . Bruit 11/20/2011  . Coronary atherosclerosis 12/12/2010  . Hypertensive heart and renal disease with renal failure 01/05/2009  . CARDIOMYOPATHY 01/05/2009    Past Surgical History:  Procedure Laterality Date  . CARDIAC CATHETERIZATION  05/2007   LAD 40% stenosis. Stent in the LAD had 20% in-stent restenosis. Followed by 40-50% distal stenosis. 50% diagonal stenosis. 40-50% circumflex stenosis. 70% OM1 stenosis and 30% ostial RCA stenosis. 30% proximal RCA stenosis.  . CORONARY STENT PLACEMENT    . HIP SURGERY Left    82yo       Home Medications    Prior to Admission medications   Medication Sig Start Date End Date Taking? Authorizing Provider  acetaminophen (TYLENOL) 500 MG tablet Take 1-2 tablets (500-1,000 mg total) by mouth 2 (two) times daily as needed. Patient taking differently: Take 500-1,000 mg by mouth 2 (two) times daily as needed for mild pain.  03/28/16  Yes Tammy Eckard, PharmD  amLODipine (NORVASC) 10 MG tablet TAKE 1 TABLET DAILY 07/12/16  Yes Minus Breeding, MD  carvedilol (COREG) 12.5 MG tablet 1/2 each morning and 1 each evening 07/05/16  Yes Claretta Fraise, MD  donepezil (ARICEPT) 5 MG tablet Take 1 tablet (5 mg total) by  mouth at bedtime. To help with memory and to help think more clearly 08/06/16  Yes Claretta Fraise, MD  escitalopram (LEXAPRO) 5 MG tablet Take 1 tablet (5 mg total) by mouth daily. 07/17/16  Yes Claretta Fraise, MD  fenofibrate micronized (LOFIBRA) 134 MG capsule TAKE (1) CAPSULE DAILY BEFORE BREAKFAST. 07/18/16  Yes Claretta Fraise, MD  furosemide (LASIX) 20 MG tablet TAKE (1) TABLET DAILY IN THE  MORNING. 08/22/16  Yes Claretta Fraise, MD  gabapentin (NEURONTIN) 600 MG tablet Take 1 tablet (600 mg total) by mouth 3 (three) times daily. 07/30/16  Yes Claretta Fraise, MD  levothyroxine (SYNTHROID, LEVOTHROID) 125 MCG tablet TAKE 1 TABLET DAILY 07/24/16  Yes Claretta Fraise, MD  linaclotide [facility]) 145 MCG CAPS capsule Take 1 capsule (145 mcg total) by mouth daily. To regulate bowel movements 07/24/16  Yes Claretta Fraise, MD  polyethylene glycol powder (GLYCOLAX/MIRALAX) powder Take 17 g by mouth daily as needed. For constipation 07/24/16  Yes Claretta Fraise, MD  tamsulosin (FLOMAX) 0.4 MG CAPS capsule Take 2 capsules (0.8 mg total) by mouth daily after supper. For urine flow and prostate 08/06/16  Yes Claretta Fraise, MD  traZODone (DESYREL) 150 MG tablet Use from 1/3 to 1 tablet nightly as needed for sleep. 05/03/16  Yes Claretta Fraise, MD  Trolamine Salicylate (ASPERCREME) 10 % LOTN Apply 1 application topically 2 (two) times daily as needed. Patient taking differently: Apply 1 application topically 2 (two) times daily as needed (itching/rash).  05/27/16  Yes Eustaquio Maize, MD  warfarin (COUMADIN) 5 MG tablet Take 5 mg once daily except on Monday and Friday take 7.5 mg. 07/30/16  Yes Claretta Fraise, MD  NITROSTAT 0.4 MG SL tablet Place 1 tablet (0.4 mg total) under the tongue every 5 (five) minutesas needed for chest pain. 11/14/15   Claretta Fraise, MD    Family History Family History  Problem Relation Age of Onset  . Adopted: Yes  . Heart attack Mother   . Heart attack Father   . Diabetes Other   . Stroke Other     Social History Social History  Substance Use Topics  . Smoking status: Never Smoker  . Smokeless tobacco: Never Used  . Alcohol use No     Comment: quit at age 89yo     Allergies   Amiodarone and Penicillins   Review of Systems Review of Systems  Cardiovascular: Positive for chest pain.  Ten systems are reviewed and are negative for acute change except as noted in the  HPI    Physical Exam Updated Vital Signs BP 119/85 (BP Location: Right Arm)   Pulse 104   Temp 98 F (36.7 C) (Oral)   Resp 26   Ht 5\' 6"  (1.676 m)   Wt 168 lb (76.2 kg)   SpO2 95%   BMI 27.12 kg/m   Physical Exam  Constitutional: He is oriented to person, place, and time. He appears well-developed and well-nourished. No distress.  HENT:  Head: Normocephalic and atraumatic.  Nose: Nose normal.  Eyes: Conjunctivae and EOM are normal. Pupils are equal, round, and reactive to light. Right eye exhibits no discharge. Left eye exhibits no discharge. No scleral icterus.  Neck: Normal range of motion. Neck supple.  Cardiovascular: Normal rate.  An irregularly irregular rhythm present. Exam reveals no gallop and no friction rub.   No murmur heard. Pulmonary/Chest: Effort normal and breath sounds normal. No stridor. No respiratory distress. He has no rales.  Abdominal: Soft. He exhibits no distension. There is  generalized tenderness (most tender in epigastrum). There is no rigidity, no rebound and no guarding.  Musculoskeletal: He exhibits no edema or tenderness.  Neurological: He is alert and oriented to person, place, and time.  Skin: Skin is warm and dry. No rash noted. He is not diaphoretic. No erythema.  Psychiatric: He has a normal mood and affect.  Vitals reviewed.    ED Treatments / Results  Labs (all labs ordered are listed, but only abnormal results are displayed) Labs Reviewed  BASIC METABOLIC PANEL - Abnormal; Notable for the following:       Result Value   Glucose, Bld 143 (*)    BUN 24 (*)    Calcium 8.8 (*)    GFR calc non Af Amer 53 (*)    All other components within normal limits  CBC - Abnormal; Notable for the following:    MCH 25.6 (*)    RDW 18.2 (*)    All other components within normal limits  HEPATIC FUNCTION PANEL - Abnormal; Notable for the following:    Total Protein 6.4 (*)    ALT 16 (*)    All other components within normal limits  BRAIN  NATRIURETIC PEPTIDE - Abnormal; Notable for the following:    B Natriuretic Peptide 364.7 (*)    All other components within normal limits  PROTIME-INR - Abnormal; Notable for the following:    Prothrombin Time 18.3 (*)    All other components within normal limits  URINE CULTURE  LIPASE, BLOOD  DIFFERENTIAL  TROPONIN I  TROPONIN I  TROPONIN I  SEDIMENTATION RATE  PROTIME-INR  URINALYSIS, ROUTINE W REFLEX MICROSCOPIC (NOT AT [facility])  I-STAT TROPOININ, ED  I-STAT CG4 LACTIC ACID, ED  I-STAT CG4 LACTIC ACID, ED    EKG  EKG Interpretation  Date/Time:  Tuesday August 27 2016 07:43:44 EST Ventricular Rate:  108 PR Interval:    QRS Duration: 139 QT Interval:  351 QTC Calculation: 471 R Axis:   -36 Text Interpretation:  Left bundle branch block Premature ventricular complexes No significant change since last tracing Confirmed by [facility] MD, Alicianna Litchford ([id number]) on 08/27/2016 7:50:14 AM       Radiology Dg Chest 2 View  Result Date: 08/27/2016 CLINICAL DATA:  82 year old male with shortness of breath and chest pain for 2 days. Initial encounter. EXAM: CHEST  2 VIEW COMPARISON:  07/17/2016 and earlier. FINDINGS: Clothing artifact projecting over the left lung. Stable lung volumes. Extensive calcified aortic atherosclerosis again noted. Normal cardiac size and mediastinal contours. Visualized tracheal air column is within normal limits. There is very mild patchy opacity in the posterior lung base on the lateral view. This is not correlated on the frontal view. Based on the lateral appears to beyond the left side. No pneumothorax, pulmonary edema, pleural effusion or other confluent pulmonary opacity. Osteopenia. Chronic left lateral rib fractures. No acute osseous abnormality identified. IMPRESSION: 1. Small area of patchy posterior left lower lobe opacity seen only on the lateral view. This is nonspecific but consider distal airway infection. No pleural effusion. 2. Extensive Calcified aortic  atherosclerosis. Electronically Signed   By: Genevie Ann M.D.   On: 08/27/2016 08:25   Ct Angio Chest/abd/pel For Dissection W And/or Wo Contrast  Result Date: 08/27/2016 CLINICAL DATA:  Abdominal pain for 1 day, history atrial fibrillation, coronary artery disease post PTCA, hypertension, dilated cardiomyopathy, atrial fibrillation, COPD EXAM: CT ANGIOGRAPHY CHEST, ABDOMEN AND PELVIS TECHNIQUE: Multidetector CT imaging through the chest, abdomen and pelvis was performed using the  standard protocol during bolus administration of intravenous contrast. Multiplanar reconstructed images and MIPs were obtained and reviewed to evaluate the vascular anatomy. CONTRAST:  100 cc Isovue 370 IV COMPARISON:  None. FINDINGS: CTA CHEST FINDINGS Cardiovascular: Atherosclerotic calcifications aorta, coronary arteries, and proximal great vessels. No evidence of intramural hematoma on precontrast imaging. Normal aortic enhancement following contrast without evidence of aneurysm or dissection. LEFT atrial dilatation. Pulmonary arteries grossly patent on nondedicated exam. Small amount of low-attenuation pericardial fluid at superior pericardial recess, which appears redundant and extends into the RIGHT paratracheal space. Mediastinum/Nodes: Base of cervical region unremarkable. Esophagus normal appearance. Mildly prominent precarinal lymph nodes 13 mm and 11 mm short axes image 50. Additional normal sized RIGHT paratracheal and subcarinal nodes. Pressure is severe Lungs/Pleura: Dependent atelectasis in both lower lobes. Additional scattered areas of subpleural atelectasis in RIGHT upper lobe and RIGHT middle lobe. No acute infiltrate, pleural effusion, or pneumothorax. Musculoskeletal: Bones appear demineralized. Healing fracture posterior LEFT ninth rib. Probable old healed mid sternal fracture. No Review of the MIP images confirms the above findings. CTA ABDOMEN AND PELVIS FINDINGS VASCULAR Atherosclerotic changes abdominal aorta  without aneurysm or dissection. Greater 50% diameter stenoses at origins of RIGHT renal artery and IMA. Less than 50% diameter stenoses at the origins of the celiac artery, SMA and LEFT renal artery. Additional scattered plaques within the iliac systems bilaterally and within the SMA. NON-VASCULAR Hepatobiliary: Calcified gallstones in gallbladder. No definite focal hepatic abnormalities. Pancreas: Normal appearance Spleen: Normal appearance.  Small splenule inferior to spleen. Adrenals/Urinary Tract: Adrenal glands normal appearance. Small LEFT renal cyst. Kidneys and ureters otherwise normal appearance. Focal wall thickening at the LEFT lateral aspect of the urinary bladder image 278 and at the anterior inferior aspect on LEFT image 288. Mild prostatic enlargement. Stomach/Bowel: Scattered colonic diverticulosis greatest at sigmoid colon. Normal appendix. Stomach decompressed, unable to accurately assess gastric wall thickness. Bowel loops otherwise normal. Lymphatic: No adenopathy Reproductive: N/A Other: RIGHT inguinal hernia containing fat. Questionable tiny LEFT inguinal hernia containing fat. No free air or free fluid. Musculoskeletal: Osseous demineralization. No acute thoracolumbar vertebral abnormalities. Central acquired spinal stenosis at L4-L5, multifactorial due to bulging disc, facet hypertrophy and ligamentum flavum thickening. Pelvis appears intact with mild degenerative changes of the RIGHT hip joint. Review of the MIP images confirms the above findings. IMPRESSION: Aortic atherosclerosis. Coronary arterial calcification. No evidence of aortic aneurysm or dissection. Stenoses at the origins of abdominal vessels as above. Cholelithiasis. RIGHT inguinal hernia containing fat with questionable tiny LEFT inguinal hernia. Bibasilar atelectasis. Scattered colonic diverticulosis. Two focal areas of questionable bladder wall thickening as above, bladder neoplasm not excluded; cystoscopic assessment  recommended to exclude tumor. Central acquired spinal stenosis at L4-L5. Few nonspecific minimally enlarged mediastinal lymph nodes. Electronically Signed   By: Lavonia Dana M.D.   On: 08/27/2016 10:04   US Abdomen Limited Ruq  Result Date: 08/27/2016 CLINICAL DATA:  Onset of mid abdominal pain this morning; history of hyperlipidemia, cardiomyopathy, kidney stones. EXAM: US ABDOMEN LIMITED - RIGHT UPPER QUADRANT COMPARISON:  Abdominal ultrasound of November 09, 2015 FINDINGS: Gallbladder: The gallbladder is adequately distended. There is focal gallbladder wall thickening to 4.7 mm. There is a stone measuring up to 1.4 cm in diameter. There is no pericholecystic fluid or positive sonographic Murphy's sign but there is mild tenderness to palpation over the gallbladder. Common bile duct: Diameter: 4.6 mm Liver: The hepatic echotexture is normal. There is no focal mass or ductal dilation. The surface contour of the liver  is normal. IMPRESSION: Gallstones. Mild gallbladder wall thickening. Mild tenderness over the gallbladder without a classic positive sonographic Murphy's sign. The findings may reflect subacute or chronic cholecystitis with cholelithiasis. Normal appearance of the liver and common bile duct. Electronically Signed   By: David  Martinique M.D.   On: 08/27/2016 13:12    Procedures Procedures (including critical care time)  Medications Ordered in ED Medications  nitroGLYCERIN (NITROSTAT) SL tablet 0.4 mg (0.4 mg Sublingual Given 08/27/16 1025)  morphine 4 MG/ML injection 4 mg (4 mg Intravenous Not Given 08/27/16 1659)  famotidine (PEPCID) IVPB 20 mg premix (not administered)  gi cocktail (Maalox,Lidocaine,Donnatal) (not administered)  warfarin (COUMADIN) tablet 7.5 mg (not administered)  Warfarin - Pharmacist Dosing Inpatient ( Does not apply Duplicate [id number])  oxyCODONE (Oxy IR/ROXICODONE) immediate release tablet 5 mg (not administered)  furosemide (LASIX) tablet 20 mg (not  administered)  donepezil (ARICEPT) tablet 5 mg (not administered)  tamsulosin (FLOMAX) capsule 0.8 mg (not administered)  gabapentin (NEURONTIN) tablet 600 mg (not administered)  levothyroxine (SYNTHROID, LEVOTHROID) tablet 125 mcg (not administered)  linaclotide (LINZESS) capsule 145 mcg (not administered)  escitalopram (LEXAPRO) tablet 5 mg (not administered)  fenofibrate tablet 160 mg (not administered)  amLODipine (NORVASC) tablet 10 mg (not administered)  carvedilol (COREG) tablet 12.5 mg (not administered)  traZODone (DESYREL) tablet 50 mg (not administered)  acetaminophen (TYLENOL) tablet 650 mg (not administered)  ondansetron (ZOFRAN) injection 4 mg (not administered)  morphine 4 MG/ML injection 4 mg (4 mg Intravenous Given 08/27/16 0846)  sodium chloride 0.9 % bolus 500 mL (0 mLs Intravenous Stopped 08/27/16 1015)  iopamidol (ISOVUE-370) 76 % injection (100 mLs  Contrast Given 08/27/16 0911)  sodium chloride 0.9 % bolus 500 mL (500 mLs Intravenous New Bag/Given 08/27/16 1519)     Initial Impression / Assessment and Plan / ED Course  I have reviewed the triage vital signs and the nursing notes.  Pertinent labs & imaging results that were available during my care of the patient were reviewed by me and considered in my medical decision making (see chart for details).  Clinical Course     Given the patient's significant past medical history, we will like to rule out ACS, dissection, acute mesenteric ischemia, and acute cholecystitis.  Patient provided with IV fluids and pain medication.  Chest x-ray with possible pneumonia however CT of the chest abdomen pelvis did not reveal evidence of pneumonia. Imaging ruled out aortic dissection, acute mesenteric ischemia. Right upper quadrant revealed cholelithiasis with subacute/chronic cholecystitis. Given the lack of elevated LFTs or lipase, she should be able to follow up with general surgery. Discussed the case with general surgery who  agreed. EKG without acute ischemic changes. Initial troponin negative. Given his extensive cardiac history patient will require admission to rule out ACS.  Appreciate hospitalist admission.  Final Clinical Impressions(s) / ED Diagnoses   Final diagnoses:  Abdominal pain  Chest pain, unspecified type  Chronic cholecystitis  Calculus of gallbladder with chronic cholecystitis without obstruction    Disposition: Admit  Condition: stable    Fatima Blank, MD 08/27/16 1731

## 2016-08-27 NOTE — H&P (Signed)
History and Physical    [name] [id number] DOB: 1926-07-20 DOA: 08/27/2016   PCP: Claretta Fraise, MD   Patient coming from/Resides with: Private residence  Admission status: Observation/telemetry -needs to be reevaluated in the next 24 hours to determine if it will be medically necessary to stay a minimum 2 midnights to rule out impending and/or unexpected changes in physiologic status that may differ from initial evaluation performed in the ER and/or at time of admission. Patient presents with epigastric chest discomfort abrupt onset today that began after eating breakfast. Exam does reveal gallstones and evidence of possible chronic cholecystitis. At this juncture no indication for surgical intervention. We'll cycle troponin to rule out cardiac etiology but will also give diet to see if can reproduce epigastric discomfort. If patient develops postprandial abdominal pain will need to be made NPO, all narcotics held and a HIDA scan with ejection fraction needs to be obtained  Chief Complaint: Epigastric chest pain  HPI: [name] is a 82 y.o. male with medical history significant for known chronic systolic and diastolic heart failure with an EF of 35-40% , paroxysmal atrial fibrillation on warfarin, BPH, dementia, hypothyroidism and dyslipidemia and peripheral neuropathy. He was initially sent to the ER reports a substernal chest pain with radiation into the upper abdomen and apparent shortness of breath. Unable to sleep through the night. Upon my evaluation of the patient he reports he developed pain after eating earlier this morning vs during the middle of the night but has been constant in nature and did not decrease until he was given pain medications in the ER. He has not had any nausea vomiting or diarrhea since development of this pain. He has not had pain like this before. He typically does not have postprandial abdominal pain. A history of occasional chest pain with exertion but  does not take nitroglycerin for this and this has not occurred in some time. He rated the pain as a 7/10 at onset and current pain is 2/10.  ED Course:  Vital Signs: BP (!) 169/109   Pulse 87   Temp 98 F (36.7 C) (Oral)   Resp (!) 32   Ht 5\' 6"  (1.676 m)   Wt 76.2 kg (168 lb)   SpO2 94%   BMI 27.12 kg/m  2 view chest x-ray: Small area of patchy posterior left lower lobe opacity seen only on the lateral view which is a nonspecific finding CT angiogram of the chest abdomen and pelvis with and without contrast: No acute findings, cholelithiasis, bibasal or atelectasis without definitive focal infiltrate seen, clonic diverticulosis, 2 focal areas of question will bladder wall thickening with cystoscopic assessment recommended to exclude tumor Abdominal ultrasound: Focal gallbladder wall thickening up to 4.7 mm, stones in the gallbladder with the largest measuring 1.4 cm, no pericholecystic fluid or positive sonographic Murphy sign but there was mild tenderness to palpation over the gallbladder. Findings may reflect subacute or chronic cholecystitis with cholelithiasis Lab data: Sodium 139, potassium 4.1, CO2 23, BUN 24, creatinine 1.19, glucose 143, anion gap 8, LFTs are normal, BNP 365, poc troponin 0.00, lactic acid 1.64, white count 7700 with normal differential, coags normal Medications and treatments: Morphine 4 mg IV 1, normal saline bolus 500 mL 2, nitroglycerin 0.4 mg sublingual 1  Review of Systems:  In addition to the HPI above,  No Fever-chills, myalgias or other constitutional symptoms No Headache, changes with Vision or hearing, new weakness, tingling, numbness in any extremity, dizziness, dysarthria or word finding difficulty, gait disturbance  or imbalance, tremors or seizure activity No problems swallowing food or Liquids, indigestion/reflux, choking or coughing while eating No Chest pain, Cough or Shortness of Breath, palpitations, orthopnea or DOE No N/V,  melena,hematochezia, dark tarry stools, constipation No dysuria, malodorous urine, hematuria or flank pain No new skin rashes, lesions, masses or bruises, No new joint pains, aches, swelling or redness No recent unintentional weight gain or loss No polyuria, polydypsia or polyphagia   Past Medical History:  Diagnosis Date  . Atrial fibrillation (HCC)    Persistent, treated with amiodarone and cardioversion  . CAD (coronary artery disease)    Last catheterization in August 2008 demonstrated the LAD 40% stenosis  . COPD (chronic obstructive pulmonary disease) ([place])   . Dilated cardiomyopathy ([place])    Felt to be secondary to atrial fibrillation. EF is about 40%  . Drug therapy    Chronic Coumadin therapy  . Dyslipidemia   . Hypertension   . Hypothyroidism    Possibly related to amiodarone    Past Surgical History:  Procedure Laterality Date  . CARDIAC CATHETERIZATION  05/2007   LAD 40% stenosis. Stent in the LAD had 20% in-stent restenosis. Followed by 40-50% distal stenosis. 50% diagonal stenosis. 40-50% circumflex stenosis. 70% OM1 stenosis and 30% ostial RCA stenosis. 30% proximal RCA stenosis.  . CORONARY STENT PLACEMENT    . HIP SURGERY Left    82yo    Social History   Social History  . Marital status: Widowed    Spouse name: N/A  . Number of children: N/A  . Years of education: N/A   Occupational History  . Part Time    Social History Main Topics  . Smoking status: Never Smoker  . Smokeless tobacco: Never Used  . Alcohol use No     Comment: quit at age 82yo  . Drug use: No  . Sexual activity: No   Other Topics Concern  . Not on file   Social History Narrative   Widowed   Gets regular exercise    Mobility: Utilizes a cane Work history: Not obtained   Allergies  Allergen Reactions  . Amiodarone     Neuropathy  . Penicillins Itching    Has patient had a PCN reaction causing immediate rash, facial/tongue/throat swelling, SOB or lightheadedness with  hypotension: Yesunknown Has patient had a PCN reaction causing severe rash involving mucus membranes or skin necrosis: unknown Has patient had a PCN reaction that required hospitalization Yesno  If all of the above answers are "NO", then may proceed with Ceph    Family History  Problem Relation Age of Onset  . Adopted: Yes  . Heart attack Mother   . Heart attack Father   . Diabetes Other   . Stroke Other      Prior to Admission medications   Medication Sig Start Date End Date Taking? Authorizing Provider  acetaminophen (TYLENOL) 500 MG tablet Take 1-2 tablets (500-1,000 mg total) by mouth 2 (two) times daily as needed. Patient taking differently: Take 500-1,000 mg by mouth 2 (two) times daily as needed for mild pain.  03/28/16  Yes Tammy Eckard, PharmD  amLODipine (NORVASC) 10 MG tablet TAKE 1 TABLET DAILY 07/12/16  Yes Minus Breeding, MD  carvedilol (COREG) 12.5 MG tablet 1/2 each morning and 1 each evening 07/05/16  Yes Claretta Fraise, MD  donepezil (ARICEPT) 5 MG tablet Take 1 tablet (5 mg total) by mouth at bedtime. To help with memory and to help think more clearly 08/06/16  Yes Cletus Gash  Stacks, MD  escitalopram (LEXAPRO) 5 MG tablet Take 1 tablet (5 mg total) by mouth daily. 07/17/16  Yes Claretta Fraise, MD  fenofibrate micronized (LOFIBRA) 134 MG capsule TAKE (1) CAPSULE DAILY BEFORE BREAKFAST. 07/18/16  Yes Claretta Fraise, MD  furosemide (LASIX) 20 MG tablet TAKE (1) TABLET DAILY IN THE MORNING. 08/22/16  Yes Claretta Fraise, MD  gabapentin (NEURONTIN) 600 MG tablet Take 1 tablet (600 mg total) by mouth 3 (three) times daily. 07/30/16  Yes Claretta Fraise, MD  levothyroxine (SYNTHROID, LEVOTHROID) 125 MCG tablet TAKE 1 TABLET DAILY 07/24/16  Yes Claretta Fraise, MD  linaclotide [facility]) 145 MCG CAPS capsule Take 1 capsule (145 mcg total) by mouth daily. To regulate bowel movements 07/24/16  Yes Claretta Fraise, MD  polyethylene glycol powder (GLYCOLAX/MIRALAX) powder Take 17 g by mouth daily as  needed. For constipation 07/24/16  Yes Claretta Fraise, MD  tamsulosin (FLOMAX) 0.4 MG CAPS capsule Take 2 capsules (0.8 mg total) by mouth daily after supper. For urine flow and prostate 08/06/16  Yes Claretta Fraise, MD  traZODone (DESYREL) 150 MG tablet Use from 1/3 to 1 tablet nightly as needed for sleep. 05/03/16  Yes Claretta Fraise, MD  Trolamine Salicylate (ASPERCREME) 10 % LOTN Apply 1 application topically 2 (two) times daily as needed. Patient taking differently: Apply 1 application topically 2 (two) times daily as needed (itching/rash).  05/27/16  Yes Eustaquio Maize, MD  warfarin (COUMADIN) 5 MG tablet Take 5 mg once daily except on Monday and Friday take 7.5 mg. 07/30/16  Yes Claretta Fraise, MD  NITROSTAT 0.4 MG SL tablet Place 1 tablet (0.4 mg total) under the tongue every 5 (five) minutesas needed for chest pain. 11/14/15   Claretta Fraise, MD    Physical Exam: Vitals:   08/27/16 1415 08/27/16 1430 08/27/16 1445 08/27/16 1530  BP: 138/92 133/97 132/83 (!) 169/109  Pulse: 103 110 95 87  Resp: (!) 28 26 23  (!) 32  Temp:      TempSrc:      SpO2: 97% 98% 98% 94%  Weight:      Height:          Constitutional: NAD, calm, comfortable Eyes: PERRL, lids and conjunctivae normal ENMT: Mucous membranes are moist. Posterior pharynx clear of any exudate or lesions.Normal dentition.  Neck: normal, supple, no masses, no thyromegaly Respiratory: clear to auscultation bilaterally, no wheezing, no crackles. Normal respiratory effort. No accessory muscle use.  Cardiovascular: Regular rate and rhythm-it was noted that with patient attempts to try to push self backwards on to stretch her heart rate briefly increased to the 130s, subtle grade 1/6 systolic murmur, no rubs / gallops. No extremity edema. 2+ pedal pulses. No carotid bruits.  Abdomen: Minimal tendernessIncluding with palpation over epigastrium, no masses palpated. No hepatosplenomegaly. Bowel sounds positive.  Musculoskeletal: no clubbing /  cyanosis. No joint deformity upper and lower extremities. Good ROM, no contractures. Normal muscle tone.  Skin: no rashes, lesions, ulcers. No induration Neurologic: CN 2-12 grossly intact. Sensation intact, DTR normal. Strength 5/5 x all 4 extremities.  Psychiatric: Normal judgment and insight. Alert and oriented x 3. Normal mood.    Labs on Admission: I have personally reviewed following labs and imaging studies  CBC:  Recent Labs Lab 08/27/16 0750 08/27/16 0801  WBC 7.7  --   NEUTROABS  --  5.4  HGB 14.2  --   HCT 43.8  --   MCV 78.9  --   PLT 250  --    Basic  Metabolic Panel:  Recent Labs Lab 08/27/16 0750  NA 139  K 4.1  CL 108  CO2 23  GLUCOSE 143*  BUN 24*  CREATININE 1.19  CALCIUM 8.8*   GFR: Estimated Creatinine Clearance: 38.7 mL/min (by C-G formula based on SCr of 1.19 mg/dL). Liver Function Tests:  Recent Labs Lab 08/27/16 0801  AST 23  ALT 16*  ALKPHOS 38  BILITOT 0.7  PROT 6.4*  ALBUMIN 3.8    Recent Labs Lab 08/27/16 0801  LIPASE 25   No results for input(s): AMMONIA in the last 168 hours. Coagulation Profile:  Recent Labs Lab 08/27/16 0750  INR 1.51   Cardiac Enzymes: No results for input(s): CKTOTAL, CKMB, CKMBINDEX, TROPONINI in the last 168 hours. BNP (last 3 results) No results for input(s): PROBNP in the last 8760 hours. HbA1C: No results for input(s): HGBA1C in the last 72 hours. CBG: No results for input(s): GLUCAP in the last 168 hours. Lipid Profile: No results for input(s): CHOL, HDL, LDLCALC, TRIG, CHOLHDL, LDLDIRECT in the last 72 hours. Thyroid Function Tests: No results for input(s): TSH, T4TOTAL, FREET4, T3FREE, THYROIDAB in the last 72 hours. Anemia Panel: No results for input(s): VITAMINB12, FOLATE, FERRITIN, TIBC, IRON, RETICCTPCT in the last 72 hours. Urine analysis:    Component Value Date/Time   COLORURINE YELLOW 07/03/2016 1216   APPEARANCEUR Clear 07/15/2016 1542   LABSPEC 1.025 07/03/2016 1216    PHURINE 5.5 07/03/2016 1216   GLUCOSEU Negative 07/15/2016 1542   HGBUR TRACE (A) 07/03/2016 1216   BILIRUBINUR Negative 07/15/2016 1542   KETONESUR NEGATIVE 07/03/2016 1216   PROTEINUR Negative 07/15/2016 1542   PROTEINUR NEGATIVE 07/03/2016 1216   UROBILINOGEN negative 11/10/2015 1149   UROBILINOGEN 0.2 03/26/2015 1140   NITRITE Negative 07/15/2016 1542   NITRITE NEGATIVE 07/03/2016 1216   LEUKOCYTESUR Negative 07/15/2016 1542   Sepsis Labs: @LABRCNTIP (procalcitonin:4,lacticidven:4) )No results found for this or any previous visit (from the past 240 hour(s)).   Radiological Exams on Admission: Dg Chest 2 View  Result Date: 08/27/2016 CLINICAL DATA:  82 year old male with shortness of breath and chest pain for 2 days. Initial encounter. EXAM: CHEST  2 VIEW COMPARISON:  07/17/2016 and earlier. FINDINGS: Clothing artifact projecting over the left lung. Stable lung volumes. Extensive calcified aortic atherosclerosis again noted. Normal cardiac size and mediastinal contours. Visualized tracheal air column is within normal limits. There is very mild patchy opacity in the posterior lung base on the lateral view. This is not correlated on the frontal view. Based on the lateral appears to beyond the left side. No pneumothorax, pulmonary edema, pleural effusion or other confluent pulmonary opacity. Osteopenia. Chronic left lateral rib fractures. No acute osseous abnormality identified. IMPRESSION: 1. Small area of patchy posterior left lower lobe opacity seen only on the lateral view. This is nonspecific but consider distal airway infection. No pleural effusion. 2. Extensive Calcified aortic atherosclerosis. Electronically Signed   By: Genevie Ann M.D.   On: 08/27/2016 08:25   Ct Angio Chest/abd/pel For Dissection W And/or Wo Contrast  Result Date: 08/27/2016 CLINICAL DATA:  Abdominal pain for 1 day, history atrial fibrillation, coronary artery disease post PTCA, hypertension, dilated cardiomyopathy,  atrial fibrillation, COPD EXAM: CT ANGIOGRAPHY CHEST, ABDOMEN AND PELVIS TECHNIQUE: Multidetector CT imaging through the chest, abdomen and pelvis was performed using the standard protocol during bolus administration of intravenous contrast. Multiplanar reconstructed images and MIPs were obtained and reviewed to evaluate the vascular anatomy. CONTRAST:  100 cc Isovue 370 IV COMPARISON:  None. FINDINGS: CTA  CHEST FINDINGS Cardiovascular: Atherosclerotic calcifications aorta, coronary arteries, and proximal great vessels. No evidence of intramural hematoma on precontrast imaging. Normal aortic enhancement following contrast without evidence of aneurysm or dissection. LEFT atrial dilatation. Pulmonary arteries grossly patent on nondedicated exam. Small amount of low-attenuation pericardial fluid at superior pericardial recess, which appears redundant and extends into the RIGHT paratracheal space. Mediastinum/Nodes: Base of cervical region unremarkable. Esophagus normal appearance. Mildly prominent precarinal lymph nodes 13 mm and 11 mm short axes image 50. Additional normal sized RIGHT paratracheal and subcarinal nodes. Pressure is severe Lungs/Pleura: Dependent atelectasis in both lower lobes. Additional scattered areas of subpleural atelectasis in RIGHT upper lobe and RIGHT middle lobe. No acute infiltrate, pleural effusion, or pneumothorax. Musculoskeletal: Bones appear demineralized. Healing fracture posterior LEFT ninth rib. Probable old healed mid sternal fracture. No Review of the MIP images confirms the above findings. CTA ABDOMEN AND PELVIS FINDINGS VASCULAR Atherosclerotic changes abdominal aorta without aneurysm or dissection. Greater 50% diameter stenoses at origins of RIGHT renal artery and IMA. Less than 50% diameter stenoses at the origins of the celiac artery, SMA and LEFT renal artery. Additional scattered plaques within the iliac systems bilaterally and within the SMA. NON-VASCULAR Hepatobiliary:  Calcified gallstones in gallbladder. No definite focal hepatic abnormalities. Pancreas: Normal appearance Spleen: Normal appearance.  Small splenule inferior to spleen. Adrenals/Urinary Tract: Adrenal glands normal appearance. Small LEFT renal cyst. Kidneys and ureters otherwise normal appearance. Focal wall thickening at the LEFT lateral aspect of the urinary bladder image 278 and at the anterior inferior aspect on LEFT image 288. Mild prostatic enlargement. Stomach/Bowel: Scattered colonic diverticulosis greatest at sigmoid colon. Normal appendix. Stomach decompressed, unable to accurately assess gastric wall thickness. Bowel loops otherwise normal. Lymphatic: No adenopathy Reproductive: N/A Other: RIGHT inguinal hernia containing fat. Questionable tiny LEFT inguinal hernia containing fat. No free air or free fluid. Musculoskeletal: Osseous demineralization. No acute thoracolumbar vertebral abnormalities. Central acquired spinal stenosis at L4-L5, multifactorial due to bulging disc, facet hypertrophy and ligamentum flavum thickening. Pelvis appears intact with mild degenerative changes of the RIGHT hip joint. Review of the MIP images confirms the above findings. IMPRESSION: Aortic atherosclerosis. Coronary arterial calcification. No evidence of aortic aneurysm or dissection. Stenoses at the origins of abdominal vessels as above. Cholelithiasis. RIGHT inguinal hernia containing fat with questionable tiny LEFT inguinal hernia. Bibasilar atelectasis. Scattered colonic diverticulosis. Two focal areas of questionable bladder wall thickening as above, bladder neoplasm not excluded; cystoscopic assessment recommended to exclude tumor. Central acquired spinal stenosis at L4-L5. Few nonspecific minimally enlarged mediastinal lymph nodes. Electronically Signed   By: Lavonia Dana M.D.   On: 08/27/2016 10:04   US Abdomen Limited Ruq  Result Date: 08/27/2016 CLINICAL DATA:  Onset of mid abdominal pain this morning; history  of hyperlipidemia, cardiomyopathy, kidney stones. EXAM: US ABDOMEN LIMITED - RIGHT UPPER QUADRANT COMPARISON:  Abdominal ultrasound of November 09, 2015 FINDINGS: Gallbladder: The gallbladder is adequately distended. There is focal gallbladder wall thickening to 4.7 mm. There is a stone measuring up to 1.4 cm in diameter. There is no pericholecystic fluid or positive sonographic Murphy's sign but there is mild tenderness to palpation over the gallbladder. Common bile duct: Diameter: 4.6 mm Liver: The hepatic echotexture is normal. There is no focal mass or ductal dilation. The surface contour of the liver is normal. IMPRESSION: Gallstones. Mild gallbladder wall thickening. Mild tenderness over the gallbladder without a classic positive sonographic Murphy's sign. The findings may reflect subacute or chronic cholecystitis with cholelithiasis. Normal appearance of the  liver and common bile duct. Electronically Signed   By: David  Martinique M.D.   On: 08/27/2016 13:12    EKG: (Independently reviewed) atrial fibrillation with ventricular rate 108 bpm, QTC 471 ms in setting of underlying left bundle branch block, frequent unifocal PVCs  Assessment/Plan Principal Problem:   Acute epigastric pain/cholelithiasis -Acute in onset and seems to have been brought about after eating food so concerning for biliary etiology -No leukocytosis and transaminases are normal -Case discussed by EDP with surgery who recommended follow-up in the office as an outpatient -Continue to follow labs and symptoms -Feed patient and if redevelops pain after eating will need to be made NPO, hold narcotics and obtained hepatobiliary scan with an ejection fraction -Low-dose oxycodone for pain  Active Problems:   Coronary atherosclerosis -Current reported chest pain not typical for ischemia but as precaution we'll cycle troponin -Obtain echocardiogram; last completed June 2017 with EF 35-40% and grade 2 diastolic dysfunction with mild RV  dilatation, and mild aortic stenosis-noted with subtle systolic murmur      Paroxysmal atrial fibrillation  -Essentially rate controlled except with exertion -Continue telemetry -Pharmacy to dose warfarin -Continue preadmission carvedilol -CHADVASC = 4    Hereditary and idiopathic peripheral neuropathy -Continue preadmission Neurontin as well as hour of sleep trazodone    Chronic combined systolic and diastolic heart failure, NYHA class 1 -EF 35-40% ECHO 6/17 -Appears compensated -Continue preadmission carvedilol and Lasix    Hypertension -Continue preadmission Norvasc and carvedilol    Focal Bladder wall thickening -Incidental finding on CT -Check urinalysis for proteinuria and hematuria -May need outpatient urology referral for cystoscopy    Hypothyroid -Continue Synthroid -TSH was 0.096 in September with free T4 1.58    BPH (benign prostatic hyperplasia) -Continue Flomax    Dyslipidemia -Continue Lofibra    Dementia -Continue Aricept      DVT prophylaxis: Warfarin per pharmacy Code Status: Full Family Communication: Daughter at bedside Disposition Plan: Anticipate discharge back to preadmission home environment once medically stable Consults called: Gen. Surgery/consulted by EDP     Samella Parr ANP-BC [facility] Pager [phone]   If 7PM-7AM, please contact night-coverage [email or web address] Password [email or web address]  08/27/2016, 5:11 PM

## 2016-08-27 NOTE — Progress Notes (Signed)
ANTICOAGULATION CONSULT NOTE - Initial Consult  Pharmacy Consult for coumadin Indication: atrial fibrillation  Allergies  Allergen Reactions  . Amiodarone     Neuropathy  . Penicillins Itching    Has patient had a PCN reaction causing immediate rash, facial/tongue/throat swelling, SOB or lightheadedness with hypotension: Yesunknown Has patient had a PCN reaction causing severe rash involving mucus membranes or skin necrosis: unknown Has patient had a PCN reaction that required hospitalization Yesno  If all of the above answers are "NO", then may proceed with Ceph    Patient Measurements: Height: 5\' 6"  (167.6 cm) Weight: 168 lb (76.2 kg) IBW/kg (Calculated) : 63.8 Heparin Dosing Weight: 76.2 kg  Vital Signs: Temp: 98 F (36.7 C) (11/14 0748) Temp Source: Oral (11/14 0748) BP: 169/109 (11/14 1530) Pulse Rate: 87 (11/14 1530)  Labs:  Recent Labs  08/27/16 0750  HGB 14.2  HCT 43.8  PLT 250  LABPROT 18.3*  INR 1.51  CREATININE 1.19    Estimated Creatinine Clearance: 38.7 mL/min (by C-G formula based on SCr of 1.19 mg/dL).   Medical History: Past Medical History:  Diagnosis Date  . Atrial fibrillation (HCC)    Persistent, treated with amiodarone and cardioversion  . CAD (coronary artery disease)    Last catheterization in August 2008 demonstrated the LAD 40% stenosis  . COPD (chronic obstructive pulmonary disease) ([place])   . Dilated cardiomyopathy ([place])    Felt to be secondary to atrial fibrillation. EF is about 40%  . Drug therapy    Chronic Coumadin therapy  . Dyslipidemia   . Hypertension   . Hypothyroidism    Possibly related to amiodarone    Assessment: 82 yo man on coumadin PTA for afib. Pharmacy consulted to dose coumadin.  INR 1.51 - below goal of 2-3.  CBC WNL. No bleeding reported.  Last coumadin clinic note 05/29/16: INR 2.6 on 5 mg daily with next INR check due 06/26/16.  Per med history his home dose is 5 mg daily except 7.5 on Monday and Friday  with last dose 11/12 - 2 days ago.    Goal of Therapy:  INR 2-3   Plan: coumadin 7.5 mg po x 1 dose Daily INR  Eudelia Bunch, Pharm.D. QP:[id number] 08/27/2016 4:06 PM

## 2016-08-27 NOTE — ED Notes (Signed)
Report given.

## 2016-08-27 NOTE — ED Notes (Signed)
Patient transported to X-ray 

## 2016-08-27 NOTE — ED Notes (Signed)
O2 sats at 91 and RR 29-30 Applied 2 liters O2

## 2016-08-28 ENCOUNTER — Observation Stay (HOSPITAL_COMMUNITY): Payer: Medicare Other

## 2016-08-28 ENCOUNTER — Encounter (HOSPITAL_COMMUNITY): Payer: Self-pay | Admitting: General Practice

## 2016-08-28 DIAGNOSIS — I481 Persistent atrial fibrillation: Secondary | ICD-10-CM

## 2016-08-28 DIAGNOSIS — Z01810 Encounter for preprocedural cardiovascular examination: Secondary | ICD-10-CM

## 2016-08-28 DIAGNOSIS — I4819 Other persistent atrial fibrillation: Secondary | ICD-10-CM

## 2016-08-28 DIAGNOSIS — I5022 Chronic systolic (congestive) heart failure: Secondary | ICD-10-CM

## 2016-08-28 DIAGNOSIS — R079 Chest pain, unspecified: Secondary | ICD-10-CM

## 2016-08-28 DIAGNOSIS — N3289 Other specified disorders of bladder: Secondary | ICD-10-CM | POA: Diagnosis not present

## 2016-08-28 DIAGNOSIS — R1013 Epigastric pain: Secondary | ICD-10-CM

## 2016-08-28 DIAGNOSIS — K8010 Calculus of gallbladder with chronic cholecystitis without obstruction: Principal | ICD-10-CM

## 2016-08-28 LAB — TROPONIN I: TROPONIN I: 0.03 ng/mL — AB (ref <0.03)

## 2016-08-28 LAB — PROTIME-INR
INR: 1.41
Prothrombin Time: 17.4 seconds — ABNORMAL HIGH (ref 11.4–15.2)

## 2016-08-28 MED ORDER — CARVEDILOL 12.5 MG PO TABS: 12.5000 mg | ORAL_TABLET | Freq: Two times a day (BID) | ORAL | 0 refills | Status: DC

## 2016-08-28 NOTE — Addendum Note (Signed)
Addended by: Thresa Ross C on: 08/28/2016 04:04 PM   Modules accepted: Orders

## 2016-08-28 NOTE — Care Management Note (Signed)
Case Management Note  Patient Details  Name: [name] MRN: [id number] Date of Birth: Mar 07, 1926  Subjective/Objective:   Pt presented for Epigastric Pain. Pt is from home and plan will be to return home.                  Action/Plan: CM did speak with patient and he is agreeable to [facility] for medication management. Agency list was provided and pt chose [facility]. Referral given to [facility] and [place] to begin within 24-48 hours post d/c.   Expected Discharge Date:                  Expected Discharge Plan:  [place]  In-House Referral:  NA  Discharge planning Services  CM Consult  Post Acute Care Choice:  Home Health Choice offered to:  Patient  DME Arranged:  N/A DME Agency:  NA  HH Arranged:  RN [place] Agency:  [place]  Status of Service:  Completed, signed off  If discussed at [place] of Stay Meetings, dates discussed:    Additional Comments:  Bethena Roys, RN 08/28/2016, 3:00 PM

## 2016-08-28 NOTE — Consult Note (Signed)
CARDIOLOGY CONSULT NOTE   Patient ID: [name] MRN: [id number] DOB/AGE: 1926/01/18 82 y.o.  Admit date: 08/27/2016  Primary Physician   Claretta Fraise, MD Primary Cardiologist   Dr. Percival Spanish Reason for Consultation   Chest/Abdominal Pain Requesting Physician  Dr. Eliseo Squires  HPI: [name] is a 82 y.o. male with a history of CAD (s/p DES LAD 2005, last cath 2008 with 40% stenosis LAD), Chronic systolic and diastolic CHF (EF 123456), Atrial Fibrillation on Warfarin, HTN, HLD, PAD, Hypothyroidism, and BPH who presented to the ED with lower abdominal pain.  Patient was at his usual state of health until yesterday morning when he began to have left lower abdominal pain that radiated up and down his torso. The pain was tight in sensation, 6/10 in severity, and intermittent. He did not associate the pain with exertion, positional change, or oral intake. The pain would improve some if he rubbed his belly. He had some shortness of breath during his pain. He did not have associated palpitations, diaphoresis, N/V/D, lightheadedness, dizziness, or LOC. He denies any swelling in his feet, legs, or abdomen. He does mention using daily enemas for help with bowel movements. He reports having his usual coffee and oatmeal prior to his pain developing. He currently denies any further pain or SOB.   In the ED, vital signs were stable, he was noted to be Afib with LBBB which is not new. Initial POC troponin was negative with subsequent troponins peaking at 0.03. BNP was 364 (>1000 in 03/2016). Lipase, lactic acid, and Hepatic function panel were unremarkable. CTA showed aortic atherosclerosis, coronary arterial calcification, cholelithiasis, and a fat containing right inguinal hernia. Abdominal U/S showed subacute to chronic cholecystitis with cholelithiasis.  Patient lives alone at home. He is a never smoker, quit alcohol in [id number], and denies illicit drug use. He does some yard work, taking care of shrubbery around  [employer] and cooks for himself.    Past Medical History:  Diagnosis Date  . Atrial fibrillation (HCC)    Persistent, treated with amiodarone and cardioversion  . CAD (coronary artery disease)    Last catheterization in August 2008 demonstrated the LAD 40% stenosis  . Cholecystitis   . COPD (chronic obstructive pulmonary disease) ([place])   . Dilated cardiomyopathy ([place])    Felt to be secondary to atrial fibrillation. EF is about 40%  . Drug therapy    Chronic Coumadin therapy  . Dyslipidemia   . Hypertension   . Hypothyroidism    Possibly related to amiodarone     Past Surgical History:  Procedure Laterality Date  . CARDIAC CATHETERIZATION  05/2007   LAD 40% stenosis. Stent in the LAD had 20% in-stent restenosis. Followed by 40-50% distal stenosis. 50% diagonal stenosis. 40-50% circumflex stenosis. 70% OM1 stenosis and 30% ostial RCA stenosis. 30% proximal RCA stenosis.  . CORONARY STENT PLACEMENT    . HIP SURGERY Left    82yo    Allergies  Allergen Reactions  . Amiodarone     Neuropathy  . Penicillins Itching    Has patient had a PCN reaction causing immediate rash, facial/tongue/throat swelling, SOB or lightheadedness with hypotension: Yesunknown Has patient had a PCN reaction causing severe rash involving mucus membranes or skin necrosis: unknown Has patient had a PCN reaction that required hospitalization Yesno  If all of the above answers are "NO", then may proceed with [facility]    I have reviewed the patient's current medications . amLODipine  10 mg Oral Daily  . carvedilol  12.5 mg Oral BID WC  . donepezil  5 mg Oral QHS  . escitalopram  5 mg Oral Daily  . famotidine (PEPCID) IV  20 mg Intravenous Q12H  . fenofibrate  160 mg Oral Daily  . furosemide  20 mg Oral Daily  . gabapentin  600 mg Oral TID  . gi cocktail  30 mL Oral Once  . levothyroxine  125 mcg Oral QAC breakfast  . linaclotide  145 mcg Oral Daily  .  morphine injection  4 mg Intravenous Once  .  tamsulosin  0.8 mg Oral QPC supper  . traZODone  50 mg Oral QHS  . Warfarin - Pharmacist Dosing Inpatient   Does not apply q1800    acetaminophen, nitroGLYCERIN, ondansetron (ZOFRAN) IV, oxyCODONE  Prior to Admission medications   Medication Sig Start Date End Date Taking? Authorizing Provider  acetaminophen (TYLENOL) 500 MG tablet Take 1-2 tablets (500-1,000 mg total) by mouth 2 (two) times daily as needed. Patient taking differently: Take 500-1,000 mg by mouth 2 (two) times daily as needed for mild pain.  03/28/16  Yes Tammy Eckard, PharmD  amLODipine (NORVASC) 10 MG tablet TAKE 1 TABLET DAILY 07/12/16  Yes Minus Breeding, MD  carvedilol (COREG) 12.5 MG tablet 1/2 each morning and 1 each evening 07/05/16  Yes Claretta Fraise, MD  donepezil (ARICEPT) 5 MG tablet Take 1 tablet (5 mg total) by mouth at bedtime. To help with memory and to help think more clearly 08/06/16  Yes Claretta Fraise, MD  escitalopram (LEXAPRO) 5 MG tablet Take 1 tablet (5 mg total) by mouth daily. 07/17/16  Yes Claretta Fraise, MD  fenofibrate micronized (LOFIBRA) 134 MG capsule TAKE (1) CAPSULE DAILY BEFORE BREAKFAST. 07/18/16  Yes Claretta Fraise, MD  furosemide (LASIX) 20 MG tablet TAKE (1) TABLET DAILY IN THE MORNING. 08/22/16  Yes Claretta Fraise, MD  gabapentin (NEURONTIN) 600 MG tablet Take 1 tablet (600 mg total) by mouth 3 (three) times daily. 07/30/16  Yes Claretta Fraise, MD  levothyroxine (SYNTHROID, LEVOTHROID) 125 MCG tablet TAKE 1 TABLET DAILY 07/24/16  Yes Claretta Fraise, MD  linaclotide [facility]) 145 MCG CAPS capsule Take 1 capsule (145 mcg total) by mouth daily. To regulate bowel movements 07/24/16  Yes Claretta Fraise, MD  polyethylene glycol powder (GLYCOLAX/MIRALAX) powder Take 17 g by mouth daily as needed. For constipation 07/24/16  Yes Claretta Fraise, MD  tamsulosin (FLOMAX) 0.4 MG CAPS capsule Take 2 capsules (0.8 mg total) by mouth daily after supper. For urine flow and prostate 08/06/16  Yes Claretta Fraise, MD    traZODone (DESYREL) 150 MG tablet Use from 1/3 to 1 tablet nightly as needed for sleep. 05/03/16  Yes Claretta Fraise, MD  Trolamine Salicylate (ASPERCREME) 10 % LOTN Apply 1 application topically 2 (two) times daily as needed. Patient taking differently: Apply 1 application topically 2 (two) times daily as needed (itching/rash).  05/27/16  Yes Eustaquio Maize, MD  warfarin (COUMADIN) 5 MG tablet Take 5 mg once daily except on Monday and Friday take 7.5 mg. 07/30/16  Yes Claretta Fraise, MD  NITROSTAT 0.4 MG SL tablet Place 1 tablet (0.4 mg total) under the tongue every 5 (five) minutesas needed for chest pain. 11/14/15   Claretta Fraise, MD     Social History   Social History  . Marital status: Widowed    Spouse name: N/A  . Number of children: N/A  . Years of education: N/A   Occupational History  . Part Time    Social History Main  Topics  . Smoking status: Never Smoker  . Smokeless tobacco: Never Used  . Alcohol use No     Comment: quit at age 51yo  . Drug use: No  . Sexual activity: No   Other Topics Concern  . Not on file   Social History Narrative   Widowed   Gets regular exercise    Family Status  Relation Status  . Mother Deceased at age 75  . Father Deceased at age 18  . Sister Deceased at age 6  . Brother Deceased at age 26's  . Sister Alive  . Sister Alive  . Brother Alive  . Brother Alive  . Brother Alive  . Brother Alive  . Other    Family History  Problem Relation Age of Onset  . Adopted: Yes  . Heart attack Mother   . Heart attack Father   . Diabetes Other   . Stroke Other       ROS:  Full 14 point review of systems complete and found to be negative unless listed above.  Physical Exam: Blood pressure 112/71, pulse 87, temperature 97.9 F (36.6 C), temperature source Oral, resp. rate (!) 25, height 5\' 6"  (1.676 m), weight 166 lb 1.6 oz (75.3 kg), SpO2 93 %.  General: Well developed, well nourished, male in no acute distress Head: EOMI, No  xanthomas. Normocephalic and atraumatic Lungs: Mild bibasilar inspiratory crackles, end-expiratory wheeze heard bilaterally Heart: Irregularly irregular.   Neck: No carotid bruits. No lymphadenopathy.  No JVD. Abdomen: Bowel sounds present, abdomen soft and non-tender without masses or hernias noted. Msk:  No weakness, no joint deformities or effusions. Extremities: No clubbing, cyanosis or edema. DP/PT/Radials 2+ and equal bilaterally. Neuro: Alert and oriented X 3. No focal deficits noted. Psych:  Good affect, responds appropriately Skin: chronic dermatitis changes of arms  Labs:   Lab Results  Component Value Date   WBC 7.7 08/27/2016   HGB 14.2 08/27/2016   HCT 43.8 08/27/2016   MCV 78.9 08/27/2016   PLT 250 08/27/2016    Recent Labs  08/28/16 0441  INR 1.41     Recent Labs Lab 08/27/16 0750 08/27/16 0801  NA 139  --   K 4.1  --   CL 108  --   CO2 23  --   BUN 24*  --   CREATININE 1.19  --   CALCIUM 8.8*  --   PROT  --  6.4*  BILITOT  --  0.7  ALKPHOS  --  38  ALT  --  16*  AST  --  23  GLUCOSE 143*  --   ALBUMIN  --  3.8   Magnesium  Date Value Ref Range Status  04/03/2016 2.2 1.7 - 2.4 mg/dL Final    Recent Labs  08/27/16 1652 08/27/16 2104 08/28/16 0441  TROPONINI <0.03 <0.03 0.03*    Recent Labs  08/27/16 0755  TROPIPOC 0.00   Pro B Natriuretic peptide (BNP)  Date/Time Value Ref Range Status  05/14/2007 09:28 AM 853.0 (H)  Final  04/25/2007 11:45 AM 55.0  Final   Lab Results  Component Value Date   CHOL 188 12/26/2015   HDL 25 (L) 12/26/2015   LDLCALC 91 12/26/2015   TRIG 360 (H) 12/26/2015   Lab Results  Component Value Date   DDIMER  05/14/2007    <0.22        AT THE INHOUSE ESTABLISHED CUTOFF VALUE OF 0.48 ug/mL FEU, THIS ASSAY HAS BEEN DOCUMENTED IN THE LITERATURE  TO HAVE   Lipase  Date/Time Value Ref Range Status  08/27/2016 08:01 AM 25 11 - 51 U/L Final   Amylase  Date/Time Value Ref Range Status  07/15/2016  03:06 PM 31 31 - 124 U/L Final   TSH  Date/Time Value Ref Range Status  07/05/2016 03:33 PM 0.096 (L) 0.450 - 4.500 uIU/mL Final   T4, Total  Date/Time Value Ref Range Status  04/08/2013 12:38 PM 12.8 (H) 5.0 - 12.5 ug/dL Final   T3, Free  Date/Time Value Ref Range Status  12/02/2014 02:45 PM 1.7 (L) 2.0 - 4.4 pg/mL Final   Vitamin B-12  Date/Time Value Ref Range Status  11/14/2014 12:27 PM 1,184 (H) 211 - 946 pg/mL Final   Folate  Date/Time Value Ref Range Status  11/14/2014 12:27 PM 13.0 >3.0 ng/mL Final    Comment:    A serum folate concentration of less than 3.1 ng/mL is considered to represent clinical deficiency.    Ferritin  Date/Time Value Ref Range Status  11/14/2014 12:27 PM 144 30 - 400 ng/mL Final   TIBC  Date/Time Value Ref Range Status  11/14/2014 12:27 PM 359 250 - 450 ug/dL Final   Iron  Date/Time Value Ref Range Status  11/14/2014 12:27 PM 116 38 - 169 ug/dL Final   Retic Ct Pct  Date/Time Value Ref Range Status  11/14/2014 12:27 PM 1.2 0.6 - 2.6 % Final    Echo:  TTE 04/02/16 - Left ventricle: The cavity size was normal. Wall thickness was   increased in a pattern of mild LVH. Systolic function was   moderately reduced. The estimated ejection fraction was in the   range of 35% to 40%. Diffuse hypokinesis. Features are consistent   with a pseudonormal left ventricular filling pattern, with   concomitant abnormal relaxation and increased filling pressure   (grade 2 diastolic dysfunction). - Aortic valve: Mildly to moderately calcified annulus. Trileaflet;   moderately thickened leaflets. There was mild stenosis. Valve   area (VTI): 1.62 cm^2. - Mitral valve: Severely calcified annulus. Moderately thickened   leaflets . - Left atrium: The atrium was severely dilated. - Right ventricle: The cavity size was mildly dilated. - Right atrium: The atrium was moderately dilated. - Technically adequate study.  ECG:  Afib, LBBB, rate 103,  PVCs  Radiology:  Dg Chest 2 View  Result Date: 08/27/2016 CLINICAL DATA:  82 year old male with shortness of breath and chest pain for 2 days. Initial encounter. EXAM: CHEST  2 VIEW COMPARISON:  07/17/2016 and earlier. FINDINGS: Clothing artifact projecting over the left lung. Stable lung volumes. Extensive calcified aortic atherosclerosis again noted. Normal cardiac size and mediastinal contours. Visualized tracheal air column is within normal limits. There is very mild patchy opacity in the posterior lung base on the lateral view. This is not correlated on the frontal view. Based on the lateral appears to beyond the left side. No pneumothorax, pulmonary edema, pleural effusion or other confluent pulmonary opacity. Osteopenia. Chronic left lateral rib fractures. No acute osseous abnormality identified. IMPRESSION: 1. Small area of patchy posterior left lower lobe opacity seen only on the lateral view. This is nonspecific but consider distal airway infection. No pleural effusion. 2. Extensive Calcified aortic atherosclerosis. Electronically Signed   By: Genevie Ann M.D.   On: 08/27/2016 08:25   Ct Angio Chest/abd/pel For Dissection W And/or Wo Contrast  Result Date: 08/27/2016 CLINICAL DATA:  Abdominal pain for 1 day, history atrial fibrillation, coronary artery disease post PTCA, hypertension, dilated cardiomyopathy,  atrial fibrillation, COPD EXAM: CT ANGIOGRAPHY CHEST, ABDOMEN AND PELVIS TECHNIQUE: Multidetector CT imaging through the chest, abdomen and pelvis was performed using the standard protocol during bolus administration of intravenous contrast. Multiplanar reconstructed images and MIPs were obtained and reviewed to evaluate the vascular anatomy. CONTRAST:  100 cc Isovue 370 IV COMPARISON:  None. FINDINGS: CTA CHEST FINDINGS Cardiovascular: Atherosclerotic calcifications aorta, coronary arteries, and proximal great vessels. No evidence of intramural hematoma on precontrast imaging. Normal aortic  enhancement following contrast without evidence of aneurysm or dissection. LEFT atrial dilatation. Pulmonary arteries grossly patent on nondedicated exam. Small amount of low-attenuation pericardial fluid at superior pericardial recess, which appears redundant and extends into the RIGHT paratracheal space. Mediastinum/Nodes: Base of cervical region unremarkable. Esophagus normal appearance. Mildly prominent precarinal lymph nodes 13 mm and 11 mm short axes image 50. Additional normal sized RIGHT paratracheal and subcarinal nodes. Pressure is severe Lungs/Pleura: Dependent atelectasis in both lower lobes. Additional scattered areas of subpleural atelectasis in RIGHT upper lobe and RIGHT middle lobe. No acute infiltrate, pleural effusion, or pneumothorax. Musculoskeletal: Bones appear demineralized. Healing fracture posterior LEFT ninth rib. Probable old healed mid sternal fracture. No Review of the MIP images confirms the above findings. CTA ABDOMEN AND PELVIS FINDINGS VASCULAR Atherosclerotic changes abdominal aorta without aneurysm or dissection. Greater 50% diameter stenoses at origins of RIGHT renal artery and IMA. Less than 50% diameter stenoses at the origins of the celiac artery, SMA and LEFT renal artery. Additional scattered plaques within the iliac systems bilaterally and within the SMA. NON-VASCULAR Hepatobiliary: Calcified gallstones in gallbladder. No definite focal hepatic abnormalities. Pancreas: Normal appearance Spleen: Normal appearance.  Small splenule inferior to spleen. Adrenals/Urinary Tract: Adrenal glands normal appearance. Small LEFT renal cyst. Kidneys and ureters otherwise normal appearance. Focal wall thickening at the LEFT lateral aspect of the urinary bladder image 278 and at the anterior inferior aspect on LEFT image 288. Mild prostatic enlargement. Stomach/Bowel: Scattered colonic diverticulosis greatest at sigmoid colon. Normal appendix. Stomach decompressed, unable to accurately  assess gastric wall thickness. Bowel loops otherwise normal. Lymphatic: No adenopathy Reproductive: N/A Other: RIGHT inguinal hernia containing fat. Questionable tiny LEFT inguinal hernia containing fat. No free air or free fluid. Musculoskeletal: Osseous demineralization. No acute thoracolumbar vertebral abnormalities. Central acquired spinal stenosis at L4-L5, multifactorial due to bulging disc, facet hypertrophy and ligamentum flavum thickening. Pelvis appears intact with mild degenerative changes of the RIGHT hip joint. Review of the MIP images confirms the above findings. IMPRESSION: Aortic atherosclerosis. Coronary arterial calcification. No evidence of aortic aneurysm or dissection. Stenoses at the origins of abdominal vessels as above. Cholelithiasis. RIGHT inguinal hernia containing fat with questionable tiny LEFT inguinal hernia. Bibasilar atelectasis. Scattered colonic diverticulosis. Two focal areas of questionable bladder wall thickening as above, bladder neoplasm not excluded; cystoscopic assessment recommended to exclude tumor. Central acquired spinal stenosis at L4-L5. Few nonspecific minimally enlarged mediastinal lymph nodes. Electronically Signed   By: Lavonia Dana M.D.   On: 08/27/2016 10:04   US Abdomen Limited Ruq  Result Date: 08/27/2016 CLINICAL DATA:  Onset of mid abdominal pain this morning; history of hyperlipidemia, cardiomyopathy, kidney stones. EXAM: US ABDOMEN LIMITED - RIGHT UPPER QUADRANT COMPARISON:  Abdominal ultrasound of November 09, 2015 FINDINGS: Gallbladder: The gallbladder is adequately distended. There is focal gallbladder wall thickening to 4.7 mm. There is a stone measuring up to 1.4 cm in diameter. There is no pericholecystic fluid or positive sonographic Murphy's sign but there is mild tenderness to palpation over the gallbladder. Common bile  duct: Diameter: 4.6 mm Liver: The hepatic echotexture is normal. There is no focal mass or ductal dilation. The surface contour  of the liver is normal. IMPRESSION: Gallstones. Mild gallbladder wall thickening. Mild tenderness over the gallbladder without a classic positive sonographic Murphy's sign. The findings may reflect subacute or chronic cholecystitis with cholelithiasis. Normal appearance of the liver and common bile duct. Electronically Signed   By: David  Martinique M.D.   On: 08/27/2016 13:12    ASSESSMENT AND PLAN:    Principal Problem:   Acute epigastric pain Active Problems:   Coronary atherosclerosis   Paroxysmal atrial fibrillation (HCC)   Hypothyroid   Hereditary and idiopathic peripheral neuropathy   BPH (benign prostatic hyperplasia)   Dyslipidemia   Chronic combined systolic and diastolic heart failure, NYHA class 1 -EF 35-40% ECHO 6/17   Dementia   Cholelithiasis   Bladder wall thickening   Calculus of gallbladder with chronic cholecystitis without obstruction  82 year old male with a history of CAD (s/p DES LAD 2005, last cath 2008 with 40% stenosis LAD), Chronic systolic and diastolic CHF (EF 123456), Atrial Fibrillation on Warfarin, HTN, HLD, PAD, Hypothyroidism, and BPH who presented to the ED with lower abdominal pain.   Abdominal Pain: Patient presented with LLQ abdominal pain which is now resolved. Imaging concerning for subacute to chronic cholecystitis with cholelithiasis. Labwork not consistent with cholestatic pattern. Can consider HIDA scan for further evaluation. Doubt ACS without elevated cardiac enzymes, typical chest pain, or acute EKG changes. He is currently asymptomatic. CT scan did show diverticulosis, but no evidence of diverticulitis. Would consider abdominal angina/mesenteric ischemia in the differential as well. Chronic constipation is also a consideration given his daily use of enemas to facilitate bowel movements. Per ED note, general surgery were consulted with plan for outpatient follow up. We will assess him while admitted for pre-operative risk-stratification in case  laparoscopic cholecystectomy is considered in the future.  -Revised Cardiac Risk Index for Pre-Operative Risk is 6.6% for major cardiac event (based on hx of CHF and Ischemic heart disease).  CAD: s/p DES LAD in 2005, last cath 2008 with 40% stenosis LAD. Myocardial perfusion imaging in 2015 was without evidence of ischemia or infarct. He has not current anginal symptoms. -On Fenofibrate for hypertriglyceridemia -prn sublingual NTG -not on ASA or Plavix while on Warfarin  Atrial Fibrillation: CHADSVASc score is 5, 6.7% yearly stroke risk. On warfarin for anticoagulation. INR subtherapeutic at 1.41. -Warfarin per pharmacy  Combined systolic and diastolic CHF: EF 99991111 June 2017 with diffuse hypokinesis, Grade 2 diastolic dysfunction. Appears euvolemic on exam. -Continue Coreg 12.5 mg BID -Continue Lasix 20 mg daily  HTN: BP stable, Amlodipine 10 mg daily plus meds as above.  Signed: Zada Finders, MD  [facility] PGY-2 08/28/2016, 10:40 AM

## 2016-08-28 NOTE — Care Management Obs Status (Signed)
[facility] NOTIFICATION   Patient Details  Name: [name] MRN: [id number] Date of Birth: 07-Sep-1926   Medicare Observation Status Notification Given:  Yes    Sharin Mons, RN 08/28/2016, 11:36 AM

## 2016-08-28 NOTE — Discharge Summary (Signed)
Physician Discharge Summary  Kharee Honor [id number] DOB: Apr 30, 1926 DOA: 08/27/2016  PCP: Claretta Fraise, MD  Admit date: 08/27/2016 Discharge date: 08/28/2016   Recommendations for Outpatient Follow-Up:   Outpatient urology follow up For further epigastric abdominal pain a HIDA scan with ejection fraction will need to be obtained--- then will need surgical referral Home health RN to help with medications   Discharge Diagnosis:   Principal Problem:   Acute epigastric pain Active Problems:   Coronary atherosclerosis   Paroxysmal atrial fibrillation (HCC)   Hypothyroid   Hereditary and idiopathic peripheral neuropathy   BPH (benign prostatic hyperplasia)   Dyslipidemia   Chronic systolic congestive heart failure (HCC)   Dementia   Cholelithiasis   Bladder wall thickening   Calculus of gallbladder with chronic cholecystitis without obstruction   Preoperative cardiovascular examination   Persistent atrial fibrillation [facility])   Discharge disposition:  Home  Discharge Condition: Improved.  Diet recommendation: Low sodium, heart healthy  Wound care: None.   History of Present Illness:   [name] is a 82 y.o. male with medical history significant for known chronic systolic and diastolic heart failure with an EF of 35-40% , paroxysmal atrial fibrillation on warfarin, BPH, dementia, hypothyroidism and dyslipidemia and peripheral neuropathy. He was initially sent to the ER reports a substernal chest pain with radiation into the upper abdomen and apparent shortness of breath. Unable to sleep through the night. Upon my evaluation of the patient he reports he developed pain after eating earlier this morning vs during the middle of the night but has been constant in nature and did not decrease until he was given pain medications in the ER. He has not had any nausea vomiting or diarrhea since development of this pain. He has not had pain like this before. He typically does not  have postprandial abdominal pain. A history of occasional chest pain with exertion but does not take nitroglycerin for this and this has not occurred in some time. He rated the pain as a 7/10 at onset and current pain is 2/10.   Hospital Course by Problem:   Acute epigastric pain/cholelithiasis -No leukocytosis and transaminases are normal -Case discussed by EDP with surgery who recommended follow-up in the office as an outpatient -patient eating well with no further abdominal pain -hepatobiliary scan with an ejection fraction if develops pain again  chronic constipation -continue home regiment    Coronary atherosclerosis -Current reported chest pain not typical for ischemia but as precaution we'll cycle troponin -seen by cards      Paroxysmal atrial fibrillation  -Essentially rate controlled except with exertion -Continue telemetry -Continue preadmission carvedilol -home health RN to help with meds    Hereditary and idiopathic peripheral neuropathy -Continue preadmission Neurontin as well as hour of sleep trazodone    Chronic combined systolic and diastolic heart failure, NYHA class 1 -EF 35-40% ECHO 6/17 -Appears compensated -Continue preadmission carvedilol and Lasix    Hypertension -Continue preadmission Norvasc and carvedilol    Focal Bladder wall thickening -Incidental finding on CT - outpatient urology referral for cystoscopy    Hypothyroid -Continue Synthroid -TSH was 0.096 in September with free T4 1.58    BPH (benign prostatic hyperplasia) -Continue Flomax    Dyslipidemia -Continue Lofibra    Dementia -Continue Aricept       Medical Consultants:    cards   Discharge Exam:   Vitals:   08/28/16 0808 08/28/16 1141  BP: 112/71 114/65  Pulse: 87 (!) 52  Resp: (!) 25 (!)  24  Temp: 97.9 F (36.6 C) 97.8 F (36.6 C)   Vitals:   08/28/16 0046 08/28/16 0413 08/28/16 0808 08/28/16 1141  BP: (!) 108/58 (!) 100/53 112/71 114/65  Pulse:  90 85 87 (!) 52  Resp: (!) 25 (!) 28 (!) 25 (!) 24  Temp: 98.1 F (36.7 C) 97.7 F (36.5 C) 97.9 F (36.6 C) 97.8 F (36.6 C)  TempSrc: Oral Oral Oral Oral  SpO2: 94% 95% 93% 95%  Weight:  75.3 kg (166 lb 1.6 oz)    Height:        Gen:  NAD-- eating well, no pain    The results of significant diagnostics from this hospitalization (including imaging, microbiology, ancillary and laboratory) are listed below for reference.     Procedures and Diagnostic Studies:   Dg Chest 2 View  Result Date: 08/27/2016 CLINICAL DATA:  82 year old male with shortness of breath and chest pain for 2 days. Initial encounter. EXAM: CHEST  2 VIEW COMPARISON:  07/17/2016 and earlier. FINDINGS: Clothing artifact projecting over the left lung. Stable lung volumes. Extensive calcified aortic atherosclerosis again noted. Normal cardiac size and mediastinal contours. Visualized tracheal air column is within normal limits. There is very mild patchy opacity in the posterior lung base on the lateral view. This is not correlated on the frontal view. Based on the lateral appears to beyond the left side. No pneumothorax, pulmonary edema, pleural effusion or other confluent pulmonary opacity. Osteopenia. Chronic left lateral rib fractures. No acute osseous abnormality identified. IMPRESSION: 1. Small area of patchy posterior left lower lobe opacity seen only on the lateral view. This is nonspecific but consider distal airway infection. No pleural effusion. 2. Extensive Calcified aortic atherosclerosis. Electronically Signed   By: Genevie Ann M.D.   On: 08/27/2016 08:25   Ct Angio Chest/abd/pel For Dissection W And/or Wo Contrast  Result Date: 08/27/2016 CLINICAL DATA:  Abdominal pain for 1 day, history atrial fibrillation, coronary artery disease post PTCA, hypertension, dilated cardiomyopathy, atrial fibrillation, COPD EXAM: CT ANGIOGRAPHY CHEST, ABDOMEN AND PELVIS TECHNIQUE: Multidetector CT imaging through the chest, abdomen  and pelvis was performed using the standard protocol during bolus administration of intravenous contrast. Multiplanar reconstructed images and MIPs were obtained and reviewed to evaluate the vascular anatomy. CONTRAST:  100 cc Isovue 370 IV COMPARISON:  None. FINDINGS: CTA CHEST FINDINGS Cardiovascular: Atherosclerotic calcifications aorta, coronary arteries, and proximal great vessels. No evidence of intramural hematoma on precontrast imaging. Normal aortic enhancement following contrast without evidence of aneurysm or dissection. LEFT atrial dilatation. Pulmonary arteries grossly patent on nondedicated exam. Small amount of low-attenuation pericardial fluid at superior pericardial recess, which appears redundant and extends into the RIGHT paratracheal space. Mediastinum/Nodes: Base of cervical region unremarkable. Esophagus normal appearance. Mildly prominent precarinal lymph nodes 13 mm and 11 mm short axes image 50. Additional normal sized RIGHT paratracheal and subcarinal nodes. Pressure is severe Lungs/Pleura: Dependent atelectasis in both lower lobes. Additional scattered areas of subpleural atelectasis in RIGHT upper lobe and RIGHT middle lobe. No acute infiltrate, pleural effusion, or pneumothorax. Musculoskeletal: Bones appear demineralized. Healing fracture posterior LEFT ninth rib. Probable old healed mid sternal fracture. No Review of the MIP images confirms the above findings. CTA ABDOMEN AND PELVIS FINDINGS VASCULAR Atherosclerotic changes abdominal aorta without aneurysm or dissection. Greater 50% diameter stenoses at origins of RIGHT renal artery and IMA. Less than 50% diameter stenoses at the origins of the celiac artery, SMA and LEFT renal artery. Additional scattered plaques within the iliac systems bilaterally  and within the SMA. NON-VASCULAR Hepatobiliary: Calcified gallstones in gallbladder. No definite focal hepatic abnormalities. Pancreas: Normal appearance Spleen: Normal appearance.  Small  splenule inferior to spleen. Adrenals/Urinary Tract: Adrenal glands normal appearance. Small LEFT renal cyst. Kidneys and ureters otherwise normal appearance. Focal wall thickening at the LEFT lateral aspect of the urinary bladder image 278 and at the anterior inferior aspect on LEFT image 288. Mild prostatic enlargement. Stomach/Bowel: Scattered colonic diverticulosis greatest at sigmoid colon. Normal appendix. Stomach decompressed, unable to accurately assess gastric wall thickness. Bowel loops otherwise normal. Lymphatic: No adenopathy Reproductive: N/A Other: RIGHT inguinal hernia containing fat. Questionable tiny LEFT inguinal hernia containing fat. No free air or free fluid. Musculoskeletal: Osseous demineralization. No acute thoracolumbar vertebral abnormalities. Central acquired spinal stenosis at L4-L5, multifactorial due to bulging disc, facet hypertrophy and ligamentum flavum thickening. Pelvis appears intact with mild degenerative changes of the RIGHT hip joint. Review of the MIP images confirms the above findings. IMPRESSION: Aortic atherosclerosis. Coronary arterial calcification. No evidence of aortic aneurysm or dissection. Stenoses at the origins of abdominal vessels as above. Cholelithiasis. RIGHT inguinal hernia containing fat with questionable tiny LEFT inguinal hernia. Bibasilar atelectasis. Scattered colonic diverticulosis. Two focal areas of questionable bladder wall thickening as above, bladder neoplasm not excluded; cystoscopic assessment recommended to exclude tumor. Central acquired spinal stenosis at L4-L5. Few nonspecific minimally enlarged mediastinal lymph nodes. Electronically Signed   By: Lavonia Dana M.D.   On: 08/27/2016 10:04   US Abdomen Limited Ruq  Result Date: 08/27/2016 CLINICAL DATA:  Onset of mid abdominal pain this morning; history of hyperlipidemia, cardiomyopathy, kidney stones. EXAM: US ABDOMEN LIMITED - RIGHT UPPER QUADRANT COMPARISON:  Abdominal ultrasound of  November 09, 2015 FINDINGS: Gallbladder: The gallbladder is adequately distended. There is focal gallbladder wall thickening to 4.7 mm. There is a stone measuring up to 1.4 cm in diameter. There is no pericholecystic fluid or positive sonographic Murphy's sign but there is mild tenderness to palpation over the gallbladder. Common bile duct: Diameter: 4.6 mm Liver: The hepatic echotexture is normal. There is no focal mass or ductal dilation. The surface contour of the liver is normal. IMPRESSION: Gallstones. Mild gallbladder wall thickening. Mild tenderness over the gallbladder without a classic positive sonographic Murphy's sign. The findings may reflect subacute or chronic cholecystitis with cholelithiasis. Normal appearance of the liver and common bile duct. Electronically Signed   By: David  Martinique M.D.   On: 08/27/2016 13:12     Labs:   Basic Metabolic Panel:  Recent Labs Lab 08/27/16 0750  NA 139  K 4.1  CL 108  CO2 23  GLUCOSE 143*  BUN 24*  CREATININE 1.19  CALCIUM 8.8*   GFR Estimated Creatinine Clearance: 38.7 mL/min (by C-G formula based on SCr of 1.19 mg/dL). Liver Function Tests:  Recent Labs Lab 08/27/16 0801  AST 23  ALT 16*  ALKPHOS 38  BILITOT 0.7  PROT 6.4*  ALBUMIN 3.8    Recent Labs Lab 08/27/16 0801  LIPASE 25   No results for input(s): AMMONIA in the last 168 hours. Coagulation profile  Recent Labs Lab 08/27/16 0750 08/28/16 0441  INR 1.51 1.41    CBC:  Recent Labs Lab 08/27/16 0750 08/27/16 0801  WBC 7.7  --   NEUTROABS  --  5.4  HGB 14.2  --   HCT 43.8  --   MCV 78.9  --   PLT 250  --    Cardiac Enzymes:  Recent Labs Lab 08/27/16 1652 08/27/16 2104  08/28/16 0441  TROPONINI <0.03 <0.03 0.03*   BNP: Invalid input(s): POCBNP CBG: No results for input(s): GLUCAP in the last 168 hours. D-Dimer No results for input(s): DDIMER in the last 72 hours. Hgb A1c No results for input(s): HGBA1C in the last 72 hours. Lipid  Profile No results for input(s): CHOL, HDL, LDLCALC, TRIG, CHOLHDL, LDLDIRECT in the last 72 hours. Thyroid function studies No results for input(s): TSH, T4TOTAL, T3FREE, THYROIDAB in the last 72 hours.  Invalid input(s): FREET3 Anemia work up No results for input(s): VITAMINB12, FOLATE, FERRITIN, TIBC, IRON, RETICCTPCT in the last 72 hours. Microbiology No results found for this or any previous visit (from the past 240 hour(s)).   Discharge Instructions:   Discharge Instructions    Diet - low sodium heart healthy    Complete by:  As directed    Discharge instructions    Complete by:  As directed    Outpatient urology follow up For further epigastric abdominal pain a HIDA scan with ejection fraction will need to be obtained--- then will need surgical referral Home health RN to help with medications   Increase activity slowly    Complete by:  As directed        Medication List    TAKE these medications   acetaminophen 500 MG tablet Commonly known as:  TYLENOL Take 1-2 tablets (500-1,000 mg total) by mouth 2 (two) times daily as needed. What changed:  reasons to take this   amLODipine 10 MG tablet Commonly known as:  NORVASC TAKE 1 TABLET DAILY   carvedilol 12.5 MG tablet Commonly known as:  COREG Take 1 tablet (12.5 mg total) by mouth 2 (two) times daily with a meal. What changed:  how much to take  how to take this  when to take this  additional instructions   donepezil 5 MG tablet Commonly known as:  ARICEPT Take 1 tablet (5 mg total) by mouth at bedtime. To help with memory and to help think more clearly   escitalopram 5 MG tablet Commonly known as:  LEXAPRO Take 1 tablet (5 mg total) by mouth daily.   fenofibrate micronized 134 MG capsule Commonly known as:  LOFIBRA TAKE (1) CAPSULE DAILY BEFORE BREAKFAST.   furosemide 20 MG tablet Commonly known as:  LASIX TAKE (1) TABLET DAILY IN THE MORNING.   gabapentin 600 MG tablet Commonly known as:   NEURONTIN Take 1 tablet (600 mg total) by mouth 3 (three) times daily.   levothyroxine 125 MCG tablet Commonly known as:  SYNTHROID, LEVOTHROID TAKE 1 TABLET DAILY   linaclotide 145 MCG Caps capsule Commonly known as:  LINZESS Take 1 capsule (145 mcg total) by mouth daily. To regulate bowel movements   NITROSTAT 0.4 MG SL tablet Generic drug:  nitroGLYCERIN Place 1 tablet (0.4 mg total) under the tongue every 5 (five) minutesas needed for chest pain.   polyethylene glycol powder powder Commonly known as:  GLYCOLAX/MIRALAX Take 17 g by mouth daily as needed. For constipation   tamsulosin 0.4 MG Caps capsule Commonly known as:  FLOMAX Take 2 capsules (0.8 mg total) by mouth daily after supper. For urine flow and prostate   traZODone 150 MG tablet Commonly known as:  DESYREL Use from 1/3 to 1 tablet nightly as needed for sleep.   Trolamine Salicylate 10 % Lotn Commonly known as:  ASPERCREME Apply 1 application topically 2 (two) times daily as needed. What changed:  reasons to take this   warfarin 5 MG tablet Commonly known  as:  COUMADIN Take 5 mg once daily except on Monday and Friday take 7.5 mg.         Time coordinating discharge: 35 min  Signed:  JESSICA U VANN   [facility] 08/28/2016, 12:47 PM

## 2016-08-30 ENCOUNTER — Telehealth: Payer: Self-pay | Admitting: *Deleted

## 2016-08-30 NOTE — Telephone Encounter (Signed)
Call Completed and Appointment Scheduled: Yes, Date: 09/02/16 with Dr Livia Snellen    DISCHARGE INFORMATION Date of Discharge:08/28/16  Discharge Facility:[facility]  Principal Discharge Diagnosis: Chest Pain  Patient and/or caregiver is knowledgeable of his/her condition(s) and treatment: Yes  MEDICATION RECONCILIATION Current medication list reviewed with patient:Yes  Outpatient Encounter Prescriptions as of 08/30/2016  Medication Sig  . acetaminophen (TYLENOL) 500 MG tablet Take 1-2 tablets (500-1,000 mg total) by mouth 2 (two) times daily as needed. (Patient taking differently: Take 500-1,000 mg by mouth 2 (two) times daily as needed for mild pain. )  . amLODipine (NORVASC) 10 MG tablet TAKE 1 TABLET DAILY  . carvedilol (COREG) 12.5 MG tablet Take 1 tablet (12.5 mg total) by mouth 2 (two) times daily with a meal.  . donepezil (ARICEPT) 5 MG tablet Take 1 tablet (5 mg total) by mouth at bedtime. To help with memory and to help think more clearly  . escitalopram (LEXAPRO) 5 MG tablet Take 1 tablet (5 mg total) by mouth daily.  . fenofibrate micronized (LOFIBRA) 134 MG capsule TAKE (1) CAPSULE DAILY BEFORE BREAKFAST.  . furosemide (LASIX) 20 MG tablet TAKE (1) TABLET DAILY IN THE MORNING.  Marland Kitchen gabapentin (NEURONTIN) 600 MG tablet Take 1 tablet (600 mg total) by mouth 3 (three) times daily.  Marland Kitchen levothyroxine (SYNTHROID, LEVOTHROID) 125 MCG tablet TAKE 1 TABLET DAILY  . linaclotide (LINZESS) 145 MCG CAPS capsule Take 1 capsule (145 mcg total) by mouth daily. To regulate bowel movements  . NITROSTAT 0.4 MG SL tablet Place 1 tablet (0.4 mg total) under the tongue every 5 (five) minutesas needed for chest pain.  . polyethylene glycol powder (GLYCOLAX/MIRALAX) powder Take 17 g by mouth daily as needed. For constipation  . tamsulosin (FLOMAX) 0.4 MG CAPS capsule Take 2 capsules (0.8 mg total) by mouth daily after supper. For urine flow and prostate  . traZODone (DESYREL) 150 MG tablet Use from 1/3 to 1  tablet nightly as needed for sleep.  Loura Pardon Salicylate (ASPERCREME) 10 % LOTN Apply 1 application topically 2 (two) times daily as needed. (Patient taking differently: Apply 1 application topically 2 (two) times daily as needed (itching/rash). )  . warfarin (COUMADIN) 5 MG tablet Take 5 mg once daily except on Monday and Friday take 7.5 mg.   No facility-administered encounter medications on file as of 08/30/2016.     Discharge Medications reviewed and reconciled with current medications.yes  Patient is able to obtain needed medications:Yes  ACTIVITIES OF DAILY LIVING  Is the patient able to perform his/her own ADLs: Yes.    Patient is receiving home health services: No.  PATIENT EDUCATION Questions/Concerns Discussed: No concerns at this time

## 2016-08-31 ENCOUNTER — Telehealth: Payer: Self-pay | Admitting: Family Medicine

## 2016-09-02 ENCOUNTER — Ambulatory Visit: Payer: Medicare Other | Admitting: Family Medicine

## 2016-09-02 ENCOUNTER — Emergency Department (HOSPITAL_COMMUNITY)
Admission: EM | Admit: 2016-09-02 | Discharge: 2016-09-02 | Disposition: A | Discharge status: Home / Self Care | Payer: Medicare Other | Attending: Emergency Medicine | Admitting: Emergency Medicine

## 2016-09-02 ENCOUNTER — Emergency Department (HOSPITAL_COMMUNITY): Payer: Medicare Other

## 2016-09-02 ENCOUNTER — Encounter (HOSPITAL_COMMUNITY): Payer: Self-pay | Admitting: Emergency Medicine

## 2016-09-02 DIAGNOSIS — Z7901 Long term (current) use of anticoagulants: Secondary | ICD-10-CM | POA: Insufficient documentation

## 2016-09-02 DIAGNOSIS — Z955 Presence of coronary angioplasty implant and graft: Secondary | ICD-10-CM | POA: Diagnosis not present

## 2016-09-02 DIAGNOSIS — I110 Hypertensive heart disease with heart failure: Secondary | ICD-10-CM | POA: Diagnosis not present

## 2016-09-02 DIAGNOSIS — R0682 Tachypnea, not elsewhere classified: Secondary | ICD-10-CM | POA: Diagnosis not present

## 2016-09-02 DIAGNOSIS — I5022 Chronic systolic (congestive) heart failure: Secondary | ICD-10-CM | POA: Diagnosis not present

## 2016-09-02 DIAGNOSIS — Z79899 Other long term (current) drug therapy: Secondary | ICD-10-CM | POA: Diagnosis not present

## 2016-09-02 DIAGNOSIS — I2510 Atherosclerotic heart disease of native coronary artery without angina pectoris: Secondary | ICD-10-CM | POA: Insufficient documentation

## 2016-09-02 DIAGNOSIS — E039 Hypothyroidism, unspecified: Secondary | ICD-10-CM | POA: Insufficient documentation

## 2016-09-02 DIAGNOSIS — J449 Chronic obstructive pulmonary disease, unspecified: Secondary | ICD-10-CM | POA: Insufficient documentation

## 2016-09-02 DIAGNOSIS — R0602 Shortness of breath: Secondary | ICD-10-CM | POA: Diagnosis not present

## 2016-09-02 DIAGNOSIS — R0600 Dyspnea, unspecified: Secondary | ICD-10-CM | POA: Insufficient documentation

## 2016-09-02 DIAGNOSIS — R079 Chest pain, unspecified: Secondary | ICD-10-CM | POA: Diagnosis not present

## 2016-09-02 LAB — CBC WITH DIFFERENTIAL/PLATELET
BASOS PCT: 0 %
Basophils Absolute: 0 10*3/uL (ref 0.0–0.1)
Eosinophils Absolute: 0.2 10*3/uL (ref 0.0–0.7)
Eosinophils Relative: 4 %
HEMATOCRIT: 42.5 % (ref 39.0–52.0)
HEMOGLOBIN: 13.6 g/dL (ref 13.0–17.0)
LYMPHS ABS: 1 10*3/uL (ref 0.7–4.0)
Lymphocytes Relative: 21 %
MCH: 25.3 pg — ABNORMAL LOW (ref 26.0–34.0)
MCHC: 32 g/dL (ref 30.0–36.0)
MCV: 79.1 fL (ref 78.0–100.0)
MONOS PCT: 7 %
Monocytes Absolute: 0.4 10*3/uL (ref 0.1–1.0)
NEUTROS ABS: 3.3 10*3/uL (ref 1.7–7.7)
NEUTROS PCT: 68 %
Platelets: 224 10*3/uL (ref 150–400)
RBC: 5.37 MIL/uL (ref 4.22–5.81)
RDW: 17.2 % — ABNORMAL HIGH (ref 11.5–15.5)
WBC: 4.9 10*3/uL (ref 4.0–10.5)

## 2016-09-02 LAB — COMPREHENSIVE METABOLIC PANEL
ALBUMIN: 3.8 g/dL (ref 3.5–5.0)
ALK PHOS: 40 U/L (ref 38–126)
ALT: 15 U/L — ABNORMAL LOW (ref 17–63)
ANION GAP: 8 (ref 5–15)
AST: 17 U/L (ref 15–41)
BILIRUBIN TOTAL: 0.4 mg/dL (ref 0.3–1.2)
BUN: 17 mg/dL (ref 6–20)
CALCIUM: 9.1 mg/dL (ref 8.9–10.3)
CO2: 26 mmol/L (ref 22–32)
CREATININE: 1.23 mg/dL (ref 0.61–1.24)
Chloride: 104 mmol/L (ref 101–111)
GFR calc Af Amer: 59 mL/min — ABNORMAL LOW (ref >60)
GFR calc non Af Amer: 51 mL/min — ABNORMAL LOW (ref >60)
GLUCOSE: 130 mg/dL — AB (ref 65–99)
Potassium: 3.9 mmol/L (ref 3.5–5.1)
Sodium: 138 mmol/L (ref 135–145)
TOTAL PROTEIN: 6.6 g/dL (ref 6.5–8.1)

## 2016-09-02 LAB — TROPONIN I: Troponin I: <0.03 ng/mL (ref <0.03)

## 2016-09-02 LAB — PROTIME-INR
INR: 1.72
Prothrombin Time: 20.4 seconds — ABNORMAL HIGH (ref 11.4–15.2)

## 2016-09-02 LAB — BRAIN NATRIURETIC PEPTIDE: B Natriuretic Peptide: 276.3 pg/mL — ABNORMAL HIGH (ref 0.0–100.0)

## 2016-09-02 LAB — D-DIMER, QUANTITATIVE: D-Dimer, Quant: 1.02 ug/mL-FEU — ABNORMAL HIGH (ref 0.00–0.50)

## 2016-09-02 MED ORDER — IOPAMIDOL (ISOVUE-370) INJECTION 76%
INTRAVENOUS | Status: AC
  Administered 2016-09-02: 80 mL
  Filled 2016-09-02: qty 100

## 2016-09-02 NOTE — Telephone Encounter (Signed)
They are ordered already. He will just refuse when they show up today

## 2016-09-02 NOTE — Telephone Encounter (Signed)
Please advise if you are working on patient's home care.

## 2016-09-02 NOTE — ED Notes (Signed)
Patient transported to X-ray 

## 2016-09-02 NOTE — ED Triage Notes (Signed)
Pt with [facility] with Shortness of breath that started last night. Pt is A/O and from home, lives alone. A.fIb on the monitor with Resp 20-24, Clear Lung sounds and 100% on RA.

## 2016-09-02 NOTE — ED Provider Notes (Signed)
20:30  reevaluation post imaging, at the request of Dr. Roderic Palau. He presented for evaluation of shortness of breath. He states that he lives alone at home. He has been taking his usual medications.  Evaluation earlier included d-dimer testing, which was elevated. Because of the CT angiogram was ordered. This was done, reviewed, and does not indicate pulmonary embolism.  Medications  iopamidol (ISOVUE-370) 76 % injection (80 mLs  Contrast Given 09/02/16 1734)    Patient Vitals for the past 24 hrs:  BP Temp Temp src Pulse Resp SpO2 Height Weight  09/02/16 2000 170/86 - - 110 26 96 % - -  09/02/16 1932 124/90 - - (!) 127 (!) 31 93 % - -  09/02/16 1900 124/69 - - 104 24 91 % - -  09/02/16 1830 141/85 - - 104 21 93 % - -  09/02/16 1800 138/95 - - 93 23 93 % - -  09/02/16 1747 135/87 - - 93 24 94 % - -  09/02/16 1630 136/76 - - 81 21 97 % - -  09/02/16 1600 129/76 - - 83 24 95 % - -  09/02/16 1500 159/75 - - 98 (!) 28 98 % - -  09/02/16 1430 144/86 - - 86 (!) 31 93 % - -  09/02/16 1400 (!) 142/103 - - 92 (!) 31 94 % - -  09/02/16 1330 135/73 - - 81 (!) 29 97 % - -  09/02/16 1300 126/72 - - (!) 58 (!) 27 95 % - -  09/02/16 1230 134/78 - - 101 (!) 28 97 % - -  09/02/16 1200 124/81 - - 82 (!) 27 97 % - -  09/02/16 1141 130/78 98 F (36.7 C) Oral 78 (!) 28 97 % - -  09/02/16 1138 - - - - - - 5\' 8"  (1.727 m) 166 lb (75.3 kg)  09/02/16 1132 - - - - - 100 % - -    8:20 PM Reevaluation with update and discussion. After initial assessment and treatment, an updated evaluation reveals , At this time. He is calm and comfortable. On exam reveals mild rhonchi right base. No Rales, wheezes or respiratory distress. Findings discussed with patient and all questions were answered. Tajha Sammarco L   Medical decision making- nonspecific shortness of breath without evidence for congestive heart failure, PE, pneumonia, metabolic instability or serious bacterial infection. There is no indication for  hospitalization or further intervention in the emergency setting. At this time.  Plan- follow-up with PCP for checkup in one or 2 days. Continue same medications. Return here if needed, for problems.    Daleen Bo, MD 09/02/16 2052

## 2016-09-02 NOTE — ED Notes (Signed)
Denies chest pain or any other pains.

## 2016-09-02 NOTE — ED Notes (Signed)
Patient taken to CT.

## 2016-09-02 NOTE — ED Notes (Signed)
Pt back from XRAY 

## 2016-09-02 NOTE — ED Notes (Signed)
Lab at bedside

## 2016-09-02 NOTE — ED Notes (Signed)
Delay in lab draw,  Pt now in room.

## 2016-09-02 NOTE — ED Notes (Signed)
Placed on 2L for work of breathing

## 2016-09-02 NOTE — ED Provider Notes (Signed)
[facility] DEPT Provider Note   CSN: [id number] Arrival date & time: 09/02/16  1132     History   Chief Complaint Chief Complaint  Patient presents with  . Shortness of Breath    HPI [name] is a 82 y.o. male.  Patient states he's had some chest pain and shortness of breath today. Patient states he gets more short of breath with exertion today   The history is provided by the patient. No language interpreter was used.  Shortness of Breath  This is a new problem. The problem occurs continuously.The current episode started 12 to 24 hours ago. The problem has been gradually improving. Associated symptoms include chest pain. Pertinent negatives include no fever, no headaches, no cough, no abdominal pain and no rash. Precipitated by: Exertion.    Past Medical History:  Diagnosis Date  . Atrial fibrillation (HCC)    Persistent, treated with amiodarone and cardioversion  . CAD (coronary artery disease)    Last catheterization in August 2008 demonstrated the LAD 40% stenosis  . Cholecystitis   . COPD (chronic obstructive pulmonary disease) ([place])   . Dilated cardiomyopathy ([place])    Felt to be secondary to atrial fibrillation. EF is about 40%  . Drug therapy    Chronic Coumadin therapy  . Dyslipidemia   . Hypertension   . Hypothyroidism    Possibly related to amiodarone    Patient Active Problem List   Diagnosis Date Noted  . Preoperative cardiovascular examination   . Persistent atrial fibrillation ([place])   . Acute epigastric pain 08/27/2016  . Dementia 08/27/2016  . Cholelithiasis 08/27/2016  . Bladder wall thickening 08/27/2016  . Calculus of gallbladder with chronic cholecystitis without obstruction   . Effusion of left knee 07/30/2016  . Arthritis of left knee 07/30/2016  . Chronic systolic congestive heart failure ([place]) 04/01/2016  . Insomnia 03/06/2016  . Dyslipidemia 12/26/2015  . Fecal soiling due to fecal incontinence 09/12/2015  . BPH (benign  prostatic hyperplasia) 08/02/2015  . Arthritis, hip 08/02/2015  . At high risk for falls 07/14/2015  . Hereditary and idiopathic peripheral neuropathy 03/06/2015  . Pre-diabetes 12/23/2014  . Hypothyroid 11/18/2013  . Right sided weakness 10/05/2013  . Long term current use of anticoagulant therapy 10/05/2013  . Paresthesias 10/05/2013  . Paroxysmal atrial fibrillation ([place]) 01/25/2013  . Tingling 11/20/2011  . Bruit 11/20/2011  . Coronary atherosclerosis 12/12/2010  . Hypertensive heart and renal disease with renal failure 01/05/2009  . CARDIOMYOPATHY 01/05/2009    Past Surgical History:  Procedure Laterality Date  . CARDIAC CATHETERIZATION  05/2007   LAD 40% stenosis. Stent in the LAD had 20% in-stent restenosis. Followed by 40-50% distal stenosis. 50% diagonal stenosis. 40-50% circumflex stenosis. 70% OM1 stenosis and 30% ostial RCA stenosis. 30% proximal RCA stenosis.  . CORONARY STENT PLACEMENT    . HIP SURGERY Left    82yo       Home Medications    Prior to Admission medications   Medication Sig Start Date End Date Taking? Authorizing Provider  acetaminophen (TYLENOL) 500 MG tablet Take 1-2 tablets (500-1,000 mg total) by mouth 2 (two) times daily as needed. Patient taking differently: Take 500-1,000 mg by mouth 2 (two) times daily as needed for mild pain.  03/28/16  Yes Tammy Eckard, PharmD  amLODipine (NORVASC) 10 MG tablet TAKE 1 TABLET DAILY 07/12/16  Yes Minus Breeding, MD  carvedilol (COREG) 12.5 MG tablet Take 1 tablet (12.5 mg total) by mouth 2 (two) times daily with a meal.  08/28/16  Yes Jessica U Vann, DO  donepezil (ARICEPT) 5 MG tablet Take 1 tablet (5 mg total) by mouth at bedtime. To help with memory and to help think more clearly 08/06/16  Yes Claretta Fraise, MD  escitalopram (LEXAPRO) 5 MG tablet Take 1 tablet (5 mg total) by mouth daily. 07/17/16  Yes Claretta Fraise, MD  fenofibrate micronized (LOFIBRA) 134 MG capsule TAKE (1) CAPSULE DAILY BEFORE BREAKFAST.  07/18/16  Yes Claretta Fraise, MD  furosemide (LASIX) 20 MG tablet TAKE (1) TABLET DAILY IN THE MORNING. 08/22/16  Yes Claretta Fraise, MD  gabapentin (NEURONTIN) 600 MG tablet Take 1 tablet (600 mg total) by mouth 3 (three) times daily. 07/30/16  Yes Claretta Fraise, MD  levothyroxine (SYNTHROID, LEVOTHROID) 125 MCG tablet TAKE 1 TABLET DAILY 07/24/16  Yes Claretta Fraise, MD  linaclotide [facility]) 145 MCG CAPS capsule Take 1 capsule (145 mcg total) by mouth daily. To regulate bowel movements 07/24/16  Yes Claretta Fraise, MD  NITROSTAT 0.4 MG SL tablet Place 1 tablet (0.4 mg total) under the tongue every 5 (five) minutesas needed for chest pain. 11/14/15  Yes Claretta Fraise, MD  polyethylene glycol powder (GLYCOLAX/MIRALAX) powder Take 17 g by mouth daily as needed. For constipation 07/24/16  Yes Claretta Fraise, MD  tamsulosin (FLOMAX) 0.4 MG CAPS capsule Take 2 capsules (0.8 mg total) by mouth daily after supper. For urine flow and prostate 08/06/16  Yes Claretta Fraise, MD  traZODone (DESYREL) 150 MG tablet Use from 1/3 to 1 tablet nightly as needed for sleep. 05/03/16  Yes Claretta Fraise, MD  Trolamine Salicylate (ASPERCREME) 10 % LOTN Apply 1 application topically 2 (two) times daily as needed. Patient taking differently: Apply 1 application topically 2 (two) times daily as needed (itching/rash).  05/27/16  Yes Eustaquio Maize, MD  warfarin (COUMADIN) 5 MG tablet Take 5 mg once daily except on Monday and Friday take 7.5 mg. 07/30/16  Yes Claretta Fraise, MD    Family History Family History  Problem Relation Age of Onset  . Adopted: Yes  . Heart attack Mother   . Heart attack Father   . Diabetes Other   . Stroke Other     Social History Social History  Substance Use Topics  . Smoking status: Never Smoker  . Smokeless tobacco: Never Used  . Alcohol use No     Comment: quit at age 51yo     Allergies   Amiodarone and Penicillins   Review of Systems Review of Systems  Constitutional: Negative for  appetite change, fatigue and fever.  HENT: Negative for congestion, ear discharge and sinus pressure.   Eyes: Negative for discharge.  Respiratory: Positive for shortness of breath. Negative for cough.   Cardiovascular: Positive for chest pain.  Gastrointestinal: Negative for abdominal pain and diarrhea.  Genitourinary: Negative for frequency and hematuria.  Musculoskeletal: Negative for back pain.  Skin: Negative for rash.  Neurological: Negative for seizures and headaches.  Psychiatric/Behavioral: Negative for hallucinations.     Physical Exam Updated Vital Signs BP 129/76   Pulse 83   Temp 98 F (36.7 C) (Oral)   Resp 24   Ht 5\' 8"  (1.727 m)   Wt 166 lb (75.3 kg)   SpO2 95%   BMI 25.24 kg/m   Physical Exam  Constitutional: He is oriented to person, place, and time. He appears well-developed.  HENT:  Head: Normocephalic.  Eyes: Conjunctivae and EOM are normal. No scleral icterus.  Neck: Neck supple. No thyromegaly present.  Cardiovascular: Normal  rate.  Exam reveals no gallop and no friction rub.   No murmur heard. Irregular rate  Pulmonary/Chest: No stridor. He has no wheezes. He has no rales. He exhibits no tenderness.  Abdominal: He exhibits no distension. There is no tenderness. There is no rebound.  Musculoskeletal: Normal range of motion. He exhibits no edema.  Lymphadenopathy:    He has no cervical adenopathy.  Neurological: He is oriented to person, place, and time. He exhibits normal muscle tone. Coordination normal.  Skin: No rash noted. No erythema.  Psychiatric: He has a normal mood and affect. His behavior is normal.     ED Treatments / Results  Labs (all labs ordered are listed, but only abnormal results are displayed) Labs Reviewed  CBC WITH DIFFERENTIAL/PLATELET - Abnormal; Notable for the following:       Result Value   MCH 25.3 (*)    RDW 17.2 (*)    All other components within normal limits  COMPREHENSIVE METABOLIC PANEL - Abnormal; Notable  for the following:    Glucose, Bld 130 (*)    ALT 15 (*)    GFR calc non Af Amer 51 (*)    GFR calc Af Amer 59 (*)    All other components within normal limits  PROTIME-INR - Abnormal; Notable for the following:    Prothrombin Time 20.4 (*)    All other components within normal limits  BRAIN NATRIURETIC PEPTIDE - Abnormal; Notable for the following:    B Natriuretic Peptide 276.3 (*)    All other components within normal limits  D-DIMER, QUANTITATIVE (NOT AT [facility]) - Abnormal; Notable for the following:    D-Dimer, Quant 1.02 (*)    All other components within normal limits  TROPONIN I    EKG  EKG Interpretation  Date/Time:  Monday September 02 2016 11:36:58 EST Ventricular Rate:  88 PR Interval:    QRS Duration: 143 QT Interval:  427 QTC Calculation: 517 R Axis:   55 Text Interpretation:  Atrial fibrillation Ventricular premature complex LVH with secondary repolarization abnormality Anterior infarct, old Prolonged QT interval Confirmed by Dewane Timson  MD, [facility] John [phone]) on 09/02/2016 11:57:37 AM Also confirmed by Sofiya Ezelle  MD, [facility] John [phone])  on 09/02/2016 2:55:59 PM       Radiology Dg Chest 2 View  Result Date: 09/02/2016 CLINICAL DATA:  Shortness of breath for 2 days EXAM: CHEST  2 VIEW COMPARISON:  08/27/2016 FINDINGS: Cardiac shadow is within normal limits. Aortic calcifications are again seen. The lungs are well aerated bilaterally. No focal infiltrate or sizable effusion is seen. Minimal left basilar atelectasis is again seen. No bony abnormality is noted. IMPRESSION: No significant interval change from the prior exam mild left basilar atelectasis remains. Electronically Signed   By: Inez Catalina M.D.   On: 09/02/2016 12:29    Procedures Procedures (including critical care time)  Medications Ordered in ED Medications  iopamidol (ISOVUE-370) 76 % injection (not administered)     Initial Impression / Assessment and Plan / ED Course  I have reviewed the triage vital signs  and the nursing notes.  Pertinent labs & imaging results that were available during my care of the patient were reviewed by me and considered in my medical decision making (see chart for details).  Clinical Course     Patient with chest pain and shortness of breath with exertion. Patient has cardiomyopathy COPD. Patient will get a CT angiogram to rule out a PE and will be admitted to medicine for continued care  Final Clinical Impressions(s) / ED Diagnoses   Final diagnoses:  None    New Prescriptions New Prescriptions   No medications on file     Milton Ferguson, MD 09/02/16 1728

## 2016-09-02 NOTE — ED Notes (Signed)
Pt returned from CT °

## 2016-09-02 NOTE — ED Notes (Signed)
ED Provider at bedside. 

## 2016-09-02 NOTE — Discharge Instructions (Signed)
Call your doctor in the morning for a follow-up appointment.  For the trouble breathing, try Mucinex, twice a day to see if it helps.

## 2016-09-03 ENCOUNTER — Emergency Department (HOSPITAL_COMMUNITY): Payer: Medicare Other

## 2016-09-03 ENCOUNTER — Ambulatory Visit: Payer: Medicare Other | Admitting: Nurse Practitioner

## 2016-09-03 ENCOUNTER — Encounter (HOSPITAL_COMMUNITY): Payer: Self-pay | Admitting: Emergency Medicine

## 2016-09-03 ENCOUNTER — Emergency Department (HOSPITAL_COMMUNITY)
Admission: EM | Admit: 2016-09-03 | Discharge: 2016-09-03 | Disposition: A | Discharge status: Home / Self Care | Payer: Medicare Other | Attending: Emergency Medicine | Admitting: Emergency Medicine

## 2016-09-03 DIAGNOSIS — E039 Hypothyroidism, unspecified: Secondary | ICD-10-CM | POA: Insufficient documentation

## 2016-09-03 DIAGNOSIS — J449 Chronic obstructive pulmonary disease, unspecified: Secondary | ICD-10-CM | POA: Diagnosis not present

## 2016-09-03 DIAGNOSIS — I5022 Chronic systolic (congestive) heart failure: Secondary | ICD-10-CM | POA: Diagnosis not present

## 2016-09-03 DIAGNOSIS — I2510 Atherosclerotic heart disease of native coronary artery without angina pectoris: Secondary | ICD-10-CM | POA: Insufficient documentation

## 2016-09-03 DIAGNOSIS — Z79899 Other long term (current) drug therapy: Secondary | ICD-10-CM | POA: Diagnosis not present

## 2016-09-03 DIAGNOSIS — Z7901 Long term (current) use of anticoagulants: Secondary | ICD-10-CM | POA: Diagnosis not present

## 2016-09-03 DIAGNOSIS — R140 Abdominal distension (gaseous): Secondary | ICD-10-CM | POA: Diagnosis not present

## 2016-09-03 DIAGNOSIS — R1030 Lower abdominal pain, unspecified: Secondary | ICD-10-CM

## 2016-09-03 DIAGNOSIS — N3289 Other specified disorders of bladder: Secondary | ICD-10-CM

## 2016-09-03 DIAGNOSIS — N329 Bladder disorder, unspecified: Secondary | ICD-10-CM | POA: Diagnosis not present

## 2016-09-03 DIAGNOSIS — I110 Hypertensive heart disease with heart failure: Secondary | ICD-10-CM | POA: Insufficient documentation

## 2016-09-03 DIAGNOSIS — R197 Diarrhea, unspecified: Secondary | ICD-10-CM | POA: Diagnosis not present

## 2016-09-03 DIAGNOSIS — K5790 Diverticulosis of intestine, part unspecified, without perforation or abscess without bleeding: Secondary | ICD-10-CM | POA: Diagnosis not present

## 2016-09-03 LAB — CBC WITH DIFFERENTIAL/PLATELET
Basophils Absolute: 0 10*3/uL (ref 0.0–0.1)
Basophils Relative: 0 %
EOS ABS: 0.2 10*3/uL (ref 0.0–0.7)
Eosinophils Relative: 3 %
HCT: 43.6 % (ref 39.0–52.0)
HEMOGLOBIN: 14.1 g/dL (ref 13.0–17.0)
Lymphocytes Relative: 18 %
Lymphs Abs: 1.2 10*3/uL (ref 0.7–4.0)
MCH: 26 pg (ref 26.0–34.0)
MCHC: 32.3 g/dL (ref 30.0–36.0)
MCV: 80.4 fL (ref 78.0–100.0)
MONOS PCT: 7 %
Monocytes Absolute: 0.5 10*3/uL (ref 0.1–1.0)
NEUTROS PCT: 72 %
Neutro Abs: 4.6 10*3/uL (ref 1.7–7.7)
Platelets: 254 10*3/uL (ref 150–400)
RBC: 5.42 MIL/uL (ref 4.22–5.81)
RDW: 17.2 % — ABNORMAL HIGH (ref 11.5–15.5)
WBC: 6.5 10*3/uL (ref 4.0–10.5)

## 2016-09-03 LAB — URINALYSIS, ROUTINE W REFLEX MICROSCOPIC
Bilirubin Urine: NEGATIVE
Glucose, UA: NEGATIVE mg/dL
Ketones, ur: NEGATIVE mg/dL
LEUKOCYTES UA: NEGATIVE
NITRITE: NEGATIVE
PH: 5.5 (ref 5.0–8.0)
Protein, ur: NEGATIVE mg/dL
SPECIFIC GRAVITY, URINE: 1.02 (ref 1.005–1.030)

## 2016-09-03 LAB — COMPREHENSIVE METABOLIC PANEL
ALT: 14 U/L — AB (ref 17–63)
ANION GAP: 8 (ref 5–15)
AST: 18 U/L (ref 15–41)
Albumin: 4.2 g/dL (ref 3.5–5.0)
Alkaline Phosphatase: 45 U/L (ref 38–126)
BUN: 21 mg/dL — ABNORMAL HIGH (ref 6–20)
CHLORIDE: 103 mmol/L (ref 101–111)
CO2: 25 mmol/L (ref 22–32)
Calcium: 9.4 mg/dL (ref 8.9–10.3)
Creatinine, Ser: 1.33 mg/dL — ABNORMAL HIGH (ref 0.61–1.24)
GFR calc non Af Amer: 46 mL/min — ABNORMAL LOW (ref >60)
GFR, EST AFRICAN AMERICAN: 53 mL/min — AB (ref >60)
Glucose, Bld: 146 mg/dL — ABNORMAL HIGH (ref 65–99)
Potassium: 4.2 mmol/L (ref 3.5–5.1)
SODIUM: 136 mmol/L (ref 135–145)
Total Bilirubin: 0.6 mg/dL (ref 0.3–1.2)
Total Protein: 7.6 g/dL (ref 6.5–8.1)

## 2016-09-03 LAB — URINE MICROSCOPIC-ADD ON
Bacteria, UA: NONE SEEN
RBC / HPF: NONE SEEN RBC/hpf (ref 0–5)

## 2016-09-03 LAB — LIPASE, BLOOD: LIPASE: 14 U/L (ref 11–51)

## 2016-09-03 LAB — C DIFFICILE QUICK SCREEN W PCR REFLEX
C DIFFICILE (CDIFF) INTERP: NOT DETECTED
C DIFFICLE (CDIFF) ANTIGEN: NEGATIVE
C Diff toxin: NEGATIVE

## 2016-09-03 LAB — PROTIME-INR
INR: 1.54
Prothrombin Time: 18.7 seconds — ABNORMAL HIGH (ref 11.4–15.2)

## 2016-09-03 MED ORDER — ONDANSETRON HCL 4 MG/2ML IJ SOLN: 4.0000 mg | Freq: Once | INTRAMUSCULAR | Status: DC

## 2016-09-03 MED ORDER — IOPAMIDOL (ISOVUE-300) INJECTION 61%
100.0000 mL | Freq: Once | INTRAVENOUS | Status: AC | PRN
  Administered 2016-09-03: 100 mL via INTRAVENOUS

## 2016-09-03 MED ORDER — SODIUM CHLORIDE 0.9 % IV BOLUS (SEPSIS)
1000.0000 mL | Freq: Once | INTRAVENOUS | Status: AC
  Administered 2016-09-03: 1000 mL via INTRAVENOUS

## 2016-09-03 MED ORDER — MORPHINE SULFATE (PF) 2 MG/ML IV SOLN: 2.0000 mg | INTRAVENOUS | Status: DC | PRN

## 2016-09-03 MED ORDER — IOPAMIDOL (ISOVUE-300) INJECTION 61%
INTRAVENOUS | Status: AC
  Administered 2016-09-03: 30 mL via ORAL
  Filled 2016-09-03: qty 30

## 2016-09-03 NOTE — ED Notes (Signed)
Patient returned from CT

## 2016-09-03 NOTE — ED Triage Notes (Signed)
Pt reports left lower abd pain x2 weeks with diarrhea.  Pt was dx with gallstones last Tuesday.  Pt denies n/v.  Pt was seen [facility] for [facility]. Pt alert and oriented.

## 2016-09-03 NOTE — ED Notes (Signed)
IV attempted x3 unsuccessfully.

## 2016-09-03 NOTE — Discharge Instructions (Signed)
Your CT scan is concerning for new bladder mass. This requires work-up by a urologist. Please call urologist above for further management.  The stool studies are negative for c. Diff diarrhea and he does not need antibiotics.  Please follow-up closely with PCP. Return without fail for worsening symptoms, including fever, intractable vomiting, concern for dehydration, confusion, or any other symptoms concerning to you.

## 2016-09-03 NOTE — ED Notes (Signed)
Patient awaiting ride for discharge. Patient verbalized understanding of diagnosis presented by MD. Left message on daughter's voice mail that patient is ready to be picked up.

## 2016-09-03 NOTE — ED Provider Notes (Signed)
[facility] DEPT Provider Note   CSN: [id number] Arrival date & time: 09/03/16  1049  By signing my name below, I, Rayna Sexton, attest that this documentation has been prepared under the direction and in the presence of Forde Dandy, MD. Electronically Signed: Rayna Sexton, ED Scribe. 09/03/16. 12:22 PM.   History   Chief Complaint Chief Complaint  Patient presents with  . Abdominal Pain    HPI HPI Comments: [name] is a 82 y.o. male with a h/o dilated cardiomyopathy w/ EF 35%, CAD, Afib on coumadin, and COPD who presents to the Emergency Department complaining of intermittent, moderate, lower abdominal pain x 3 weeks. He reports associated diarrhea and abdominal distension and describes his pain as a burning sensation. He denies his pain worsens s/p oral intact. His daughter believes he is currently taking a medication for constipation. He was dx with gallstones last week and was referred to a surgeon which his daughter denies he has done as of yet. Pt denies a surgical history to his abdomen. He has not recently taken abx. Pt was recently admitted for SOB.  He denies fever, bloody stools, nausea, vomiting, dysuria, CP, SOB, appetite change or increased urinary frequency.   The history is provided by the patient, medical records and a relative. No language interpreter was used.    Past Medical History:  Diagnosis Date  . Atrial fibrillation (HCC)    Persistent, treated with amiodarone and cardioversion  . CAD (coronary artery disease)    Last catheterization in August 2008 demonstrated the LAD 40% stenosis  . Cholecystitis   . COPD (chronic obstructive pulmonary disease) ([place])   . Dilated cardiomyopathy ([place])    Felt to be secondary to atrial fibrillation. EF is about 40%  . Drug therapy    Chronic Coumadin therapy  . Dyslipidemia   . Hypertension   . Hypothyroidism    Possibly related to amiodarone    Patient Active Problem List   Diagnosis Date Noted  .  Preoperative cardiovascular examination   . Persistent atrial fibrillation ([place])   . Acute epigastric pain 08/27/2016  . Dementia 08/27/2016  . Cholelithiasis 08/27/2016  . Bladder wall thickening 08/27/2016  . Calculus of gallbladder with chronic cholecystitis without obstruction   . Effusion of left knee 07/30/2016  . Arthritis of left knee 07/30/2016  . Chronic systolic congestive heart failure ([place]) 04/01/2016  . Insomnia 03/06/2016  . Dyslipidemia 12/26/2015  . Fecal soiling due to fecal incontinence 09/12/2015  . BPH (benign prostatic hyperplasia) 08/02/2015  . Arthritis, hip 08/02/2015  . At high risk for falls 07/14/2015  . Hereditary and idiopathic peripheral neuropathy 03/06/2015  . Pre-diabetes 12/23/2014  . Hypothyroid 11/18/2013  . Right sided weakness 10/05/2013  . Long term current use of anticoagulant therapy 10/05/2013  . Paresthesias 10/05/2013  . Paroxysmal atrial fibrillation ([place]) 01/25/2013  . Tingling 11/20/2011  . Bruit 11/20/2011  . Coronary atherosclerosis 12/12/2010  . Hypertensive heart and renal disease with renal failure 01/05/2009  . CARDIOMYOPATHY 01/05/2009    Past Surgical History:  Procedure Laterality Date  . CARDIAC CATHETERIZATION  05/2007   LAD 40% stenosis. Stent in the LAD had 20% in-stent restenosis. Followed by 40-50% distal stenosis. 50% diagonal stenosis. 40-50% circumflex stenosis. 70% OM1 stenosis and 30% ostial RCA stenosis. 30% proximal RCA stenosis.  . CORONARY STENT PLACEMENT    . HIP SURGERY Left    82yo       Home Medications    Prior to Admission medications  Medication Sig Start Date End Date Taking? Authorizing Provider  acetaminophen (TYLENOL) 500 MG tablet Take 1-2 tablets (500-1,000 mg total) by mouth 2 (two) times daily as needed. Patient taking differently: Take 500-1,000 mg by mouth 2 (two) times daily as needed for mild pain.  03/28/16  Yes Tammy Eckard, PharmD  amLODipine (NORVASC) 10 MG tablet TAKE 1  TABLET DAILY 07/12/16  Yes Minus Breeding, MD  benzonatate (TESSALON) 200 MG capsule Take 200 mg by mouth 3 (three) times daily as needed for cough.   Yes Historical Provider, MD  carvedilol (COREG) 12.5 MG tablet Take 1 tablet (12.5 mg total) by mouth 2 (two) times daily with a meal. 08/28/16  Yes Jessica U Vann, DO  donepezil (ARICEPT) 5 MG tablet Take 1 tablet (5 mg total) by mouth at bedtime. To help with memory and to help think more clearly 08/06/16  Yes Claretta Fraise, MD  escitalopram (LEXAPRO) 5 MG tablet Take 1 tablet (5 mg total) by mouth daily. 07/17/16  Yes Claretta Fraise, MD  fenofibrate micronized (LOFIBRA) 134 MG capsule TAKE (1) CAPSULE DAILY BEFORE BREAKFAST. 07/18/16  Yes Claretta Fraise, MD  furosemide (LASIX) 20 MG tablet TAKE (1) TABLET DAILY IN THE MORNING. 08/22/16  Yes Claretta Fraise, MD  gabapentin (NEURONTIN) 600 MG tablet Take 1 tablet (600 mg total) by mouth 3 (three) times daily. 07/30/16  Yes Claretta Fraise, MD  levothyroxine (SYNTHROID, LEVOTHROID) 125 MCG tablet TAKE 1 TABLET DAILY 07/24/16  Yes Claretta Fraise, MD  linaclotide [facility]) 145 MCG CAPS capsule Take 1 capsule (145 mcg total) by mouth daily. To regulate bowel movements 07/24/16  Yes Claretta Fraise, MD  polyethylene glycol powder (GLYCOLAX/MIRALAX) powder Take 17 g by mouth daily as needed. For constipation 07/24/16  Yes Claretta Fraise, MD  tamsulosin (FLOMAX) 0.4 MG CAPS capsule Take 2 capsules (0.8 mg total) by mouth daily after supper. For urine flow and prostate 08/06/16  Yes Claretta Fraise, MD  warfarin (COUMADIN) 5 MG tablet Take 5 mg once daily except on Monday and Friday take 7.5 mg. 07/30/16  Yes Claretta Fraise, MD  NITROSTAT 0.4 MG SL tablet Place 1 tablet (0.4 mg total) under the tongue every 5 (five) minutesas needed for chest pain. 11/14/15   Claretta Fraise, MD    Family History Family History  Problem Relation Age of Onset  . Adopted: Yes  . Heart attack Mother   . Heart attack Father   . Diabetes Other    . Stroke Other     Social History Social History  Substance Use Topics  . Smoking status: Never Smoker  . Smokeless tobacco: Never Used  . Alcohol use No     Comment: quit at age 82yo     Allergies   Amiodarone and Penicillins   Review of Systems Review of Systems  Constitutional: Negative for appetite change, chills and fever.  Respiratory: Negative for shortness of breath.   Cardiovascular: Negative for chest pain.  Gastrointestinal: Positive for abdominal distention, abdominal pain and diarrhea. Negative for anal bleeding, blood in stool, constipation, nausea and vomiting.  Genitourinary: Negative for dysuria and frequency.  All other systems reviewed and are negative.  Physical Exam Updated Vital Signs BP 153/89   Pulse 83   Temp 97.6 F (36.4 C) (Oral)   Resp 21   Ht 5\' 8"  (1.727 m)   Wt 166 lb (75.3 kg)   SpO2 96%   BMI 25.24 kg/m   Physical Exam Nursing note and vitals reviewed. Constitutional: Well developed, well  nourished, non-toxic, and in no acute distress Head: Normocephalic and atraumatic.  Mouth/Throat: Oropharynx is clear and moist.  Neck: Normal range of motion. Neck supple.  Cardiovascular: Normal rate and regular rhythm.   Pulmonary/Chest: Effort normal and breath sounds normal.  Abdominal: Soft. Mild distension. Diffuse TTP. There is no rebound and no guarding.  Musculoskeletal: Normal range of motion.  Neurological: Alert, no facial droop, fluent speech, moves all extremities symmetrically Skin: Skin is warm and dry.  Psychiatric: Cooperative  ED Treatments / Results  Labs (all labs ordered are listed, but only abnormal results are displayed) Labs Reviewed  CBC WITH DIFFERENTIAL/PLATELET - Abnormal; Notable for the following:       Result Value   RDW 17.2 (*)    All other components within normal limits  COMPREHENSIVE METABOLIC PANEL - Abnormal; Notable for the following:    Glucose, Bld 146 (*)    BUN 21 (*)    Creatinine, Ser  1.33 (*)    ALT 14 (*)    GFR calc non Af Amer 46 (*)    GFR calc Af Amer 53 (*)    All other components within normal limits  URINALYSIS, ROUTINE W REFLEX MICROSCOPIC (NOT AT [facility]) - Abnormal; Notable for the following:    Hgb urine dipstick TRACE (*)    All other components within normal limits  PROTIME-INR - Abnormal; Notable for the following:    Prothrombin Time 18.7 (*)    All other components within normal limits  URINE MICROSCOPIC-ADD ON - Abnormal; Notable for the following:    Squamous Epithelial / LPF 0-5 (*)    Casts HYALINE CASTS (*)    All other components within normal limits  C DIFFICILE QUICK SCREEN W PCR REFLEX  LIPASE, BLOOD    EKG  EKG Interpretation  Date/Time:  Tuesday September 03 2016 11:48:12 EST Ventricular Rate:  92 PR Interval:    QRS Duration: 142 QT Interval:  430 QTC Calculation: 532 R Axis:   61 Text Interpretation:  Atrial fibrillation IVCD, consider atypical LBBB History of atrial fibrillation.  with PVC Confirmed by Lakasha Mcfall MD, Melvin [phone]) on 09/03/2016 12:35:09 PM       Radiology Dg Chest 2 View  Result Date: 09/02/2016 CLINICAL DATA:  Shortness of breath for 2 days EXAM: CHEST  2 VIEW COMPARISON:  08/27/2016 FINDINGS: Cardiac shadow is within normal limits. Aortic calcifications are again seen. The lungs are well aerated bilaterally. No focal infiltrate or sizable effusion is seen. Minimal left basilar atelectasis is again seen. No bony abnormality is noted. IMPRESSION: No significant interval change from the prior exam mild left basilar atelectasis remains. Electronically Signed   By: Inez Catalina M.D.   On: 09/02/2016 12:29   Ct Angio Chest Pe W And/or Wo Contrast  Result Date: 09/02/2016 CLINICAL DATA:  Chest pain, shortness of breath. EXAM: CT ANGIOGRAPHY CHEST WITH CONTRAST TECHNIQUE: Multidetector CT imaging of the chest was performed using the standard protocol during bolus administration of intravenous contrast. Multiplanar CT  image reconstructions and MIPs were obtained to evaluate the vascular anatomy. CONTRAST:  80 mL of Isovue 350 intravenously. COMPARISON:  Radiographs of same day. CT scan of July 03, 2016. FINDINGS: Cardiovascular: Atherosclerosis of thoracic aorta is noted without aneurysm formation. There is no evidence of pulmonary embolus. Coronary artery calcifications are noted. Mediastinum/Nodes: 2.2 cm precarinal lymph node is noted which is not significantly changed compared to prior exam. Lungs/Pleura: No pneumothorax or pleural effusion is noted. Mild bibasilar subsegmental atelectasis or  edema is noted posteriorly. Upper Abdomen: No definite abnormality is noted. Musculoskeletal: No significant osseous abnormality is noted. Review of the MIP images confirms the above findings. IMPRESSION: Aortic atherosclerosis. Coronary artery calcifications are noted suggesting coronary artery disease. No definite evidence of pulmonary embolus. Mild bilateral posterior basilar subsegmental atelectasis or edema is noted. Electronically Signed   By: Marijo Conception, M.D.   On: 09/02/2016 18:18   Ct Abdomen Pelvis W Contrast  Result Date: 09/03/2016 CLINICAL DATA:  Lower abdominal pain with diarrhea for 3 weeks. Abdominal distension. Recent diagnosis of cholelithiasis. EXAM: CT ABDOMEN AND PELVIS WITH CONTRAST TECHNIQUE: Multidetector CT imaging of the abdomen and pelvis was performed using the standard protocol following bolus administration of intravenous contrast. CONTRAST:  14mL ISOVUE-300 IOPAMIDOL (ISOVUE-300) INJECTION 61%, 171mL ISOVUE-300 IOPAMIDOL (ISOVUE-300) INJECTION 61% COMPARISON:  Right upper quadrant abdominal ultrasound 08/27/2016 and CT stone study 11/09/2015 FINDINGS: Lower chest: There is interstitial prominence and airspace disease in the dependent portion of both lower lobes, left greater than right, and representing a change from the prior CT of January 2017. There is a subacute/healing posterior left  ninth rib fracture. Negative for pleural effusion. The heart is enlarged, with dilatation of the main both atria and at least the left ventricle. There is atherosclerotic calcification in visualized portions of the left coronary artery. There is calcification associated with the aortic valve. Hepatobiliary: There are several peripherally calcified and centrally lucent gallstones within the gallbladder lumen. Gallbladder wall is normal in thickness. There is no pericholecystic cystic stranding or fluid. The liver contains a stable circumscribed 6 mm low-density in the left lobe, likely a cyst. No new or suspicious hepatic mass. Negative for biliary ductal dilatation. The portal vein is patent. Common bile duct normal in caliber. Pancreas: Unremarkable. No pancreatic ductal dilatation or surrounding inflammatory changes. Spleen: Normal in size without focal abnormality. Adrenals/Urinary Tract: Negative for obstruction or suspicious mass. Stable 1.3 cm cyst upper pole left kidney. Ureters are normal in caliber. *Suspicious/abnormal finding in the urinary bladder. There is asymmetric wall thickening along the left lateral lateral wall, with a polypoid enhancing mass along the anterior aspect of the left lateral wall inferiorly. This masslike thickening measures up to 2.5 cm AP diameter by 1.4 cm transverse diameter by 0.8 cm craniocaudal span. There is also mild wall thickening of the anterior right lateral urinary bladder wall. This finding is less prominent than along the left urinary bladder wall. Stomach/Bowel: The stomach is not very distended at the time of the exam and unremarkable. Small bowel loops are normal in caliber and have normal wall thickness. Terminal ileum is normal. Normal appendix courses medial to the cecum. Scattered colonic diverticula throughout most of the colon, with severe colonic diverticulosis involving the proximal and mid sigmoid colon. No associated inflammatory changes to suggest acute  diverticulitis. Negative for free intraperitoneal air. Vascular/Lymphatic: Moderate to heavy atherosclerotic calcification of the abdominal aorta, proximal renal arteries, the internal, and external iliac arteries and proximal femoral arteries. Atherosclerotic calcification involving the celiac axis and its branches in the proximal superior mesenteric artery. Negative for lymphadenopathy. Reproductive: Prostate gland mildly enlarged, and contains scattered calcifications within it. Other: A right inguinal hernia sac contains a nonobstructed loop of small bowel. On the previous CT of January 2017, only fat was present within this hernia sac. There is no fluid or stranding associated with the hernia. There is no bowel obstruction. No abdominopelvic ascites. Musculoskeletal: Visualized thoracic in the lumbar spine vertebral bodies are normal in  height. Disc spaces preserved. Negative for fracture in the abdomen or pelvis. There is a subacute appearing left ninth posterior rib fracture, with callus formation. IMPRESSION: 1. Urinary bladder wall thickening and mass as described above. Findings are suspicious for transitional cell carcinoma. Consider urology consultation. 2. Airspace disease in the lower lobes bilaterally. Question the possibility of aspiration or pneumonia. 3. Right inguinal hernia contains a single loop of nonobstructed small bowel. 4. Extensive sigmoid colon diverticulosis without evidence of acute inflammatory change. 5. Cholelithiasis without complicating features. 6. Coronary artery and generalized atherosclerosis and aortic valve calcifications. 7. Cardiomegaly with multi-chamber enlargement. 8. Prostamegaly. 9. Subacute posterior left ninth rib fracture. Electronically Signed   By: Curlene Dolphin M.D.   On: 09/03/2016 14:16    Procedures Procedures  DIAGNOSTIC STUDIES: Oxygen Saturation is 95% on RA, adequate by my interpretation.    COORDINATION OF CARE: 12:20 PM Discussed next steps with  pt. Pt verbalized understanding and is agreeable with the plan.    Medications Ordered in ED Medications  morphine 2 MG/ML injection 2 mg (not administered)  ondansetron (ZOFRAN) injection 4 mg (0 mg Intravenous Hold 09/03/16 1429)  sodium chloride 0.9 % bolus 1,000 mL (0 mLs Intravenous Stopped 09/03/16 1429)  iopamidol (ISOVUE-300) 61 % injection (30 mLs Oral Contrast Given 09/03/16 1240)  iopamidol (ISOVUE-300) 61 % injection 100 mL (100 mLs Intravenous Contrast Given 09/03/16 1322)     Initial Impression / Assessment and Plan / ED Course  I have reviewed the triage vital signs and the nursing notes.  Pertinent labs & imaging results that were available during my care of the patient were reviewed by me and considered in my medical decision making (see chart for details).  Clinical Course    Intermittent abdominal pain and diarrhea x 2-3 weeks. Old records reviewed, yesterday seen for SOB w/ negative CT angio and unremarkable work-up. Also seen in ED 11/14 for chest pain and abdominal pain. With negative CT dissection study and w/ ultrasound c/f subacute vs chronic cholecystitis. It was discussed w/ surgeon who recommended outpatient surgery f/u. Pt unsure if same pain. Pain seems diffuse today but he localizes it to lower abdomen and not RUQ.  Cdiff testing negative. Blood work with mild AKI, but otherwise reassuring CBC, liver enzymes, UA. CT abd/pelvis obtained and visualized. With Cholelithiasis. With new bladder mass c/f malignancy and this was discussed with patient and daughter. Discussed outpatient urology follow-up and given referral. Also with question of bilateral lower lobe airspace disease. CT chest obtained yesterday only notes bilateral lower lobe atelectasis. He has no fever, leukocytosis, cough or dyspnea today. I am not impelled to treat this as pneumonia currently.  At this time, he is not requiring inpatient admission. Discussed with patient and family regarding close  outpatient follow-up and reviewed strict return instructions.     I personally performed the services described in this documentation, which was scribed in my presence. The recorded information has been reviewed and is accurate.  Final Clinical Impressions(s) / ED Diagnoses   Final diagnoses:  Diarrhea, unspecified type  Lower abdominal pain  Bladder mass    New Prescriptions New Prescriptions   No medications on file     Forde Dandy, MD 09/03/16 1525

## 2016-09-06 ENCOUNTER — Encounter (HOSPITAL_COMMUNITY): Payer: Self-pay | Admitting: Emergency Medicine

## 2016-09-06 ENCOUNTER — Emergency Department (HOSPITAL_COMMUNITY): Payer: Medicare Other

## 2016-09-06 ENCOUNTER — Emergency Department (HOSPITAL_COMMUNITY)
Admission: EM | Admit: 2016-09-06 | Discharge: 2016-09-06 | Disposition: A | Discharge status: Home / Self Care | Payer: Medicare Other | Attending: Emergency Medicine | Admitting: Emergency Medicine

## 2016-09-06 DIAGNOSIS — F419 Anxiety disorder, unspecified: Secondary | ICD-10-CM | POA: Insufficient documentation

## 2016-09-06 DIAGNOSIS — J449 Chronic obstructive pulmonary disease, unspecified: Secondary | ICD-10-CM | POA: Diagnosis not present

## 2016-09-06 DIAGNOSIS — I5022 Chronic systolic (congestive) heart failure: Secondary | ICD-10-CM | POA: Diagnosis not present

## 2016-09-06 DIAGNOSIS — Z955 Presence of coronary angioplasty implant and graft: Secondary | ICD-10-CM | POA: Diagnosis not present

## 2016-09-06 DIAGNOSIS — I482 Chronic atrial fibrillation, unspecified: Secondary | ICD-10-CM

## 2016-09-06 DIAGNOSIS — R0602 Shortness of breath: Secondary | ICD-10-CM | POA: Diagnosis not present

## 2016-09-06 DIAGNOSIS — I110 Hypertensive heart disease with heart failure: Secondary | ICD-10-CM | POA: Insufficient documentation

## 2016-09-06 DIAGNOSIS — E039 Hypothyroidism, unspecified: Secondary | ICD-10-CM | POA: Diagnosis not present

## 2016-09-06 DIAGNOSIS — Z7901 Long term (current) use of anticoagulants: Secondary | ICD-10-CM | POA: Diagnosis not present

## 2016-09-06 DIAGNOSIS — Z79899 Other long term (current) drug therapy: Secondary | ICD-10-CM | POA: Diagnosis not present

## 2016-09-06 DIAGNOSIS — I2510 Atherosclerotic heart disease of native coronary artery without angina pectoris: Secondary | ICD-10-CM | POA: Diagnosis not present

## 2016-09-06 DIAGNOSIS — R0682 Tachypnea, not elsewhere classified: Secondary | ICD-10-CM | POA: Diagnosis not present

## 2016-09-06 LAB — CBC WITH DIFFERENTIAL/PLATELET
BASOS ABS: 0 10*3/uL (ref 0.0–0.1)
BASOS PCT: 0 %
EOS PCT: 4 %
Eosinophils Absolute: 0.2 10*3/uL (ref 0.0–0.7)
HCT: 42.3 % (ref 39.0–52.0)
Hemoglobin: 14 g/dL (ref 13.0–17.0)
Lymphocytes Relative: 23 %
Lymphs Abs: 1.2 10*3/uL (ref 0.7–4.0)
MCH: 26 pg (ref 26.0–34.0)
MCHC: 33.1 g/dL (ref 30.0–36.0)
MCV: 78.6 fL (ref 78.0–100.0)
MONO ABS: 0.3 10*3/uL (ref 0.1–1.0)
Monocytes Relative: 5 %
Neutro Abs: 3.6 10*3/uL (ref 1.7–7.7)
Neutrophils Relative %: 68 %
PLATELETS: 245 10*3/uL (ref 150–400)
RBC: 5.38 MIL/uL (ref 4.22–5.81)
RDW: 17.2 % — AB (ref 11.5–15.5)
WBC: 5.3 10*3/uL (ref 4.0–10.5)

## 2016-09-06 LAB — URINALYSIS, ROUTINE W REFLEX MICROSCOPIC
BILIRUBIN URINE: NEGATIVE
Glucose, UA: NEGATIVE mg/dL
KETONES UR: NEGATIVE mg/dL
Leukocytes, UA: NEGATIVE
Nitrite: NEGATIVE
PROTEIN: NEGATIVE mg/dL
Specific Gravity, Urine: 1.017 (ref 1.005–1.030)
pH: 6.5 (ref 5.0–8.0)

## 2016-09-06 LAB — URINE MICROSCOPIC-ADD ON

## 2016-09-06 LAB — COMPREHENSIVE METABOLIC PANEL
ALBUMIN: 3.8 g/dL (ref 3.5–5.0)
ALK PHOS: 40 U/L (ref 38–126)
ALT: 12 U/L — ABNORMAL LOW (ref 17–63)
ANION GAP: 10 (ref 5–15)
AST: 20 U/L (ref 15–41)
BILIRUBIN TOTAL: 0.6 mg/dL (ref 0.3–1.2)
BUN: 13 mg/dL (ref 6–20)
CALCIUM: 8.9 mg/dL (ref 8.9–10.3)
CO2: 21 mmol/L — ABNORMAL LOW (ref 22–32)
Chloride: 107 mmol/L (ref 101–111)
Creatinine, Ser: 1.17 mg/dL (ref 0.61–1.24)
GFR calc Af Amer: >60 mL/min (ref >60)
GFR, EST NON AFRICAN AMERICAN: 54 mL/min — AB (ref >60)
GLUCOSE: 147 mg/dL — AB (ref 65–99)
Potassium: 4 mmol/L (ref 3.5–5.1)
Sodium: 138 mmol/L (ref 135–145)
TOTAL PROTEIN: 7.1 g/dL (ref 6.5–8.1)

## 2016-09-06 LAB — PROTIME-INR
INR: 2.59
PROTHROMBIN TIME: 28.3 s — AB (ref 11.4–15.2)

## 2016-09-06 LAB — TROPONIN I

## 2016-09-06 LAB — BRAIN NATRIURETIC PEPTIDE: B NATRIURETIC PEPTIDE 5: 345.7 pg/mL — AB (ref 0.0–100.0)

## 2016-09-06 MED ORDER — LORAZEPAM 2 MG/ML IJ SOLN
0.5000 mg | Freq: Once | INTRAMUSCULAR | Status: AC
  Administered 2016-09-06: 0.5 mg via INTRAVENOUS
  Filled 2016-09-06: qty 1

## 2016-09-06 MED ORDER — LORAZEPAM 0.5 MG PO TABS: 0.5000 mg | ORAL_TABLET | Freq: Every day | ORAL | 0 refills | Status: DC

## 2016-09-06 MED ORDER — DILTIAZEM HCL 25 MG/5ML IV SOLN
20.0000 mg | Freq: Once | INTRAVENOUS | Status: AC
  Administered 2016-09-06: 20 mg via INTRAVENOUS
  Filled 2016-09-06: qty 5

## 2016-09-06 NOTE — ED Notes (Signed)
cardiology at bedside. Stemi canceled.

## 2016-09-06 NOTE — ED Provider Notes (Signed)
[facility] DEPT Provider Note   CSN: [id number] Arrival date & time: 09/06/16  1045     History   Chief Complaint Chief Complaint  Patient presents with  . Code STEMI    HPI [name] is a 82 y.o. male.  Pt presents to the ED today with Code STEMI.  The pt called EMS because of sob.  He denies cp.  The pt was seen in the ED on the 20th for sob and had a ct angio that was negative.  Pt was in the ed on the 21st for diarrhea. EMS did not have an old EKG to compare and they called the stemi b/c pt had a lbbb, and they did not know if it was new or old.  Pt has a hx of a.fib with a chadvasc score of 3 for age, htn.  Pt on chronic coumadin.      Past Medical History:  Diagnosis Date  . Atrial fibrillation (HCC)    Persistent, treated with amiodarone and cardioversion  . CAD (coronary artery disease)    Last catheterization in August 2008 demonstrated the LAD 40% stenosis  . Cholecystitis   . COPD (chronic obstructive pulmonary disease) ([place])   . Dilated cardiomyopathy ([place])    Felt to be secondary to atrial fibrillation. EF is about 40%  . Drug therapy    Chronic Coumadin therapy  . Dyslipidemia   . Hypertension   . Hypothyroidism    Possibly related to amiodarone    Patient Active Problem List   Diagnosis Date Noted  . Preoperative cardiovascular examination   . Persistent atrial fibrillation ([place])   . Acute epigastric pain 08/27/2016  . Dementia 08/27/2016  . Cholelithiasis 08/27/2016  . Bladder wall thickening 08/27/2016  . Calculus of gallbladder with chronic cholecystitis without obstruction   . Effusion of left knee 07/30/2016  . Arthritis of left knee 07/30/2016  . Chronic systolic congestive heart failure ([place]) 04/01/2016  . Insomnia 03/06/2016  . Dyslipidemia 12/26/2015  . Fecal soiling due to fecal incontinence 09/12/2015  . BPH (benign prostatic hyperplasia) 08/02/2015  . Arthritis, hip 08/02/2015  . At high risk for falls 07/14/2015  .  Hereditary and idiopathic peripheral neuropathy 03/06/2015  . Pre-diabetes 12/23/2014  . Hypothyroid 11/18/2013  . Right sided weakness 10/05/2013  . Long term current use of anticoagulant therapy 10/05/2013  . Paresthesias 10/05/2013  . Paroxysmal atrial fibrillation ([place]) 01/25/2013  . Tingling 11/20/2011  . Bruit 11/20/2011  . Coronary atherosclerosis 12/12/2010  . Hypertensive heart and renal disease with renal failure 01/05/2009  . CARDIOMYOPATHY 01/05/2009    Past Surgical History:  Procedure Laterality Date  . CARDIAC CATHETERIZATION  05/2007   LAD 40% stenosis. Stent in the LAD had 20% in-stent restenosis. Followed by 40-50% distal stenosis. 50% diagonal stenosis. 40-50% circumflex stenosis. 70% OM1 stenosis and 30% ostial RCA stenosis. 30% proximal RCA stenosis.  . CORONARY STENT PLACEMENT    . HIP SURGERY Left    82yo       Home Medications    Prior to Admission medications   Medication Sig Start Date End Date Taking? Authorizing Provider  acetaminophen (TYLENOL) 500 MG tablet Take 1-2 tablets (500-1,000 mg total) by mouth 2 (two) times daily as needed. Patient taking differently: Take 500-1,000 mg by mouth 2 (two) times daily as needed for mild pain.  03/28/16   Cherre Robins, PharmD  amLODipine (NORVASC) 10 MG tablet TAKE 1 TABLET DAILY 07/12/16   Minus Breeding, MD  benzonatate (TESSALON) 200  MG capsule Take 200 mg by mouth 3 (three) times daily as needed for cough.    Historical Provider, MD  carvedilol (COREG) 12.5 MG tablet Take 1 tablet (12.5 mg total) by mouth 2 (two) times daily with a meal. 08/28/16   Geradine Girt, DO  donepezil (ARICEPT) 5 MG tablet Take 1 tablet (5 mg total) by mouth at bedtime. To help with memory and to help think more clearly 08/06/16   Claretta Fraise, MD  escitalopram (LEXAPRO) 5 MG tablet Take 1 tablet (5 mg total) by mouth daily. 07/17/16   Claretta Fraise, MD  fenofibrate micronized (LOFIBRA) 134 MG capsule TAKE (1) CAPSULE DAILY BEFORE  BREAKFAST. 07/18/16   Claretta Fraise, MD  furosemide (LASIX) 20 MG tablet TAKE (1) TABLET DAILY IN THE MORNING. 08/22/16   Claretta Fraise, MD  gabapentin (NEURONTIN) 600 MG tablet Take 1 tablet (600 mg total) by mouth 3 (three) times daily. 07/30/16   Claretta Fraise, MD  levothyroxine (SYNTHROID, LEVOTHROID) 125 MCG tablet TAKE 1 TABLET DAILY 07/24/16   Claretta Fraise, MD  linaclotide [facility]) 145 MCG CAPS capsule Take 1 capsule (145 mcg total) by mouth daily. To regulate bowel movements 07/24/16   Claretta Fraise, MD  LORazepam (ATIVAN) 0.5 MG tablet Take 1 tablet (0.5 mg total) by mouth at bedtime. 09/06/16   Isla Pence, MD  NITROSTAT 0.4 MG SL tablet Place 1 tablet (0.4 mg total) under the tongue every 5 (five) minutesas needed for chest pain. 11/14/15   Claretta Fraise, MD  polyethylene glycol powder (GLYCOLAX/MIRALAX) powder Take 17 g by mouth daily as needed. For constipation 07/24/16   Claretta Fraise, MD  tamsulosin (FLOMAX) 0.4 MG CAPS capsule Take 2 capsules (0.8 mg total) by mouth daily after supper. For urine flow and prostate 08/06/16   Claretta Fraise, MD  warfarin (COUMADIN) 5 MG tablet Take 5 mg once daily except on Monday and Friday take 7.5 mg. 07/30/16   Claretta Fraise, MD    Family History Family History  Problem Relation Age of Onset  . Adopted: Yes  . Heart attack Mother   . Heart attack Father   . Diabetes Other   . Stroke Other     Social History Social History  Substance Use Topics  . Smoking status: Never Smoker  . Smokeless tobacco: Never Used  . Alcohol use No     Comment: quit at age 3yo     Allergies   Amiodarone and Penicillins   Review of Systems Review of Systems  Respiratory: Positive for shortness of breath.   All other systems reviewed and are negative.    Physical Exam Updated Vital Signs BP 137/87   Pulse 109   Temp 98 F (36.7 C) (Oral)   Resp 23   Ht 5\' 8"  (1.727 m)   Wt 166 lb (75.3 kg)   SpO2 99%   BMI 25.24 kg/m   Physical Exam    Constitutional: He is oriented to person, place, and time. He appears well-developed. He appears distressed.  HENT:  Head: Normocephalic and atraumatic.  Right Ear: External ear normal.  Left Ear: External ear normal.  Nose: Nose normal.  Mouth/Throat: Oropharynx is clear and moist.  Eyes: Conjunctivae and EOM are normal. Pupils are equal, round, and reactive to light.  Neck: Normal range of motion. Neck supple.  Cardiovascular: Normal heart sounds and intact distal pulses.  An irregularly irregular rhythm present. Tachycardia present.   Pulmonary/Chest: Effort normal and breath sounds normal.  Abdominal: Soft. Bowel sounds  are normal.  Musculoskeletal: Normal range of motion.  Neurological: He is alert and oriented to person, place, and time.  Skin: Skin is warm.  Psychiatric: He has a normal mood and affect. His behavior is normal. Judgment and thought content normal.  Nursing note and vitals reviewed.    ED Treatments / Results  Labs (all labs ordered are listed, but only abnormal results are displayed) Labs Reviewed  COMPREHENSIVE METABOLIC PANEL - Abnormal; Notable for the following:       Result Value   CO2 21 (*)    Glucose, Bld 147 (*)    ALT 12 (*)    GFR calc non Af Amer 54 (*)    All other components within normal limits  CBC WITH DIFFERENTIAL/PLATELET - Abnormal; Notable for the following:    RDW 17.2 (*)    All other components within normal limits  BRAIN NATRIURETIC PEPTIDE - Abnormal; Notable for the following:    B Natriuretic Peptide 345.7 (*)    All other components within normal limits  PROTIME-INR - Abnormal; Notable for the following:    Prothrombin Time 28.3 (*)    All other components within normal limits  TROPONIN I  URINALYSIS, ROUTINE W REFLEX MICROSCOPIC (NOT AT [facility])    EKG  EKG Interpretation  Date/Time:  Friday September 06 2016 10:49:41 EST Ventricular Rate:  137 PR Interval:    QRS Duration: 136 QT Interval:  331 QTC  Calculation: 511 R Axis:   51 Text Interpretation:  Wide-QRS tachycardia Multiple premature complexes, vent & supraven IVCD, consider atypical LBBB Atrial fibrillation No significant change since last tracing Confirmed by [facility] MD, Mayar Whittier ([id number]) on 09/06/2016 12:05:24 PM       Radiology Dg Chest Port 1 View  Result Date: 09/06/2016 CLINICAL DATA:  Shortness of breath . EXAM: PORTABLE CHEST 1 VIEW COMPARISON:  CT 09/02/2016.  Chest x-ray 09/02/2016. FINDINGS: Mediastinum hilar structures normal. Cardiomegaly. No evidence of overt congestive heart failure. No pleural effusion or pneumothorax. Left costophrenic angle not imaged. No acute bony abnormality . IMPRESSION: 1. Cardiomegaly noted. No evidence of overt congestive heart failure. 2. No focal pulmonary infiltrates. Electronically Signed   By: Marcello Moores  Register   On: 09/06/2016 11:22    Procedures Procedures (including critical care time)  Medications Ordered in ED Medications  LORazepam (ATIVAN) injection 0.5 mg (0.5 mg Intravenous Given 09/06/16 1059)  diltiazem (CARDIZEM) injection 20 mg (20 mg Intravenous Given 09/06/16 1250)     Initial Impression / Assessment and Plan / ED Course  I have reviewed the triage vital signs and the nursing notes.  Pertinent labs & imaging results that were available during my care of the patient were reviewed by me and considered in my medical decision making (see chart for details).  Clinical Course    Pt seen by cardiology upon pt's arrival.  They cancelled the code stemi.  Pt did not take his medications today and his hr was elevated.  Pt is feeling much better after given 20 mg of cardizem iv. HR now back to normal.  Pt able to ambulate with o2 sat 98%.  Pt told to take meds as directed and to f/u with his pcp.   Final Clinical Impressions(s) / ED Diagnoses   Final diagnoses:  Chronic atrial fibrillation (HCC)  Anxiety    New Prescriptions New Prescriptions   LORAZEPAM (ATIVAN)  0.5 MG TABLET    Take 1 tablet (0.5 mg total) by mouth at bedtime.     Almyra Free  Gilford Raid, MD 09/06/16 1421

## 2016-09-06 NOTE — ED Triage Notes (Signed)
Pt arrives by [facility] as an activate dccode STEMI. stemi team aware of pt in ED, EDP at bedside. Pt received 324mg  ASA en route.  Pt denies any cp c/o of sob that started suddenly this am.

## 2016-09-06 NOTE — Discharge Instructions (Signed)
Take your daily meds as directed.

## 2016-09-06 NOTE — ED Notes (Addendum)
Pt pulse oximetry remained at 98% on room air while ambulating

## 2016-09-09 ENCOUNTER — Ambulatory Visit (INDEPENDENT_AMBULATORY_CARE_PROVIDER_SITE_OTHER): Payer: Medicare Other | Admitting: Family Medicine

## 2016-09-09 ENCOUNTER — Other Ambulatory Visit: Payer: Self-pay

## 2016-09-09 ENCOUNTER — Other Ambulatory Visit: Payer: Self-pay | Admitting: Family Medicine

## 2016-09-09 ENCOUNTER — Encounter: Payer: Self-pay | Admitting: Family Medicine

## 2016-09-09 VITALS — BP 86/57 | HR 86 | Temp 97.0°F | Resp 18

## 2016-09-09 DIAGNOSIS — I480 Paroxysmal atrial fibrillation: Secondary | ICD-10-CM

## 2016-09-09 DIAGNOSIS — R0602 Shortness of breath: Secondary | ICD-10-CM

## 2016-09-09 LAB — COAGUCHEK XS/INR WAIVED
INR: 2.6 — AB (ref 0.9–1.1)
Prothrombin Time: 31.3 s

## 2016-09-09 MED ORDER — DIGOXIN 125 MCG PO TABS: 125.0000 ug | ORAL_TABLET | Freq: Every day | ORAL | 2 refills | Status: AC

## 2016-09-09 MED ORDER — CARVEDILOL 12.5 MG PO TABS: 6.2500 mg | ORAL_TABLET | Freq: Two times a day (BID) | ORAL | 0 refills | Status: AC

## 2016-09-09 MED ORDER — LORAZEPAM 0.5 MG PO TABS: 0.5000 mg | ORAL_TABLET | Freq: Two times a day (BID) | ORAL | 1 refills | Status: AC | PRN

## 2016-09-09 NOTE — Patient Outreach (Signed)
[facility]) Care Management  09/09/2016  Avondre Flanary [birth year] [id number]  Telephonic Screening   Referral Date:  08/08/2016 Source:  Dr. Claretta Fraise, [facility], contact:  Glean Salen [phone] Issue:  HF. Hospital admission x 7 (2017).  Really fast deconditioning; increased falls and decreased alertness.  [facility] Patient is primary contact Insurance:  UHC/AARP Medicare  MR Review: Providers: Primary MD:  Dr. Claretta Fraise, [facility] - last appt 08/06/16 Co-morbidities:  Chronic combined systolic and diastolic CHF,  COPD, CAD, Paroxysmal atrial fibrillation, Dilated cardiomyopathy, EF 40%, A-fib, HTN, Hypothyroidism, Chronic Coumadin therapy, Dyslipidemia, Hypertension Arthritis of left knee with pain and swelling with h/o intraarticular chondroma; steroid injection 08/06/16.  Prednisone 10//27/17 Benign prostatic hyperplasia with nocturia with frequent urination during the night about ever 1 hour - tamsulosin/Flomax 08/09/16 Memory issues donepezil/Aricept 08/09/16 Admissions 1 -04/01/2016 - 04/03/2016  - Acute on chronic combined systolic and diastolic CHF -[id number]  03/28/2015 - [facility] ED visits 3   -07/03/16 weakness following starting two new medications for BPH -05/01/16 constant, mild, generalized body aches, worse in the knees and hips onset last night. -04/01/16 ED to Hospital admission   Outreach call #4 to patient.   Patient reached but hung up the phone again on the 2nd attempt.   No patient response received from unsuccessful outreach letter sent on 08/21/16.  H/o Daughter, Heinz Knuckles [phone] but will need patient's verbal consent to speak with daughter and unable to engage patient long enough to get consent.  [facility]N notified of case closure.  MD notified of case closure.   Nathaneil Canary, BSN, RN, [place] Care Management Care Management Coordinator [phone]  Direct [phone] Cell [phone] Office [phone] Fax Mikel [email or web address]

## 2016-09-09 NOTE — Telephone Encounter (Signed)
This encounter was created in error - please disregard.

## 2016-09-09 NOTE — Patient Outreach (Signed)
[facility]) Care Management  09/09/2016  [name] 1925/12/01 [id number]  Telephonic Screening   Referral Date:  08/08/2016 Source:  Dr. Claretta Fraise, [facility], contact:  Glean Salen [phone] Issue:  HF. Hospital admission x 7 (2017).  Really fast deconditioning; increased falls and decreased alertness.  [facility] Patient is primary contact Insurance:  UHC/AARP Medicare  MR Review: Providers: Primary MD:  Dr. Claretta Fraise, [facility] - last appt 08/06/16 Co-morbidities:  Chronic combined systolic and diastolic CHF,  COPD, CAD, Paroxysmal atrial fibrillation, Dilated cardiomyopathy, EF 40%, A-fib, HTN, Hypothyroidism, Chronic Coumadin therapy, Dyslipidemia, Hypertension Arthritis of left knee with pain and swelling with h/o intraarticular chondroma; steroid injection 08/06/16.  Prednisone 10//27/17 Benign prostatic hyperplasia with nocturia with frequent urination during the night about ever 1 hour - tamsulosin/Flomax 08/09/16 Memory issues donepezil/Aricept 08/09/16 Admissions 3 -08/27/16 - 08/28/16 - Acute epigastric pain -04/01/2016 - 04/03/2016  - Acute on chronic combined systolic and diastolic CHF -[id number]  03/28/2015 - [facility] ED visits 7  -09/06/16 - Code STEMI   -09/03/16 - ABD pain and diarrhea -09/02/16 - shortness of breath, chest pain   -08/27/16 - Chest pain / ABD pain  -07/03/16 weakness following starting two new medications for BPH -05/01/16 constant, mild, generalized body aches, worse in the knees and hips onset last night. -04/01/16 ED to Hospital admission   Outreach call #4 to patient.   Patient reached but hung up the phone again on the 2nd attempt.   No patient response received from unsuccessful outreach letter sent on 08/21/16.  Additional call to Daughter, [name] [phone] and conference call attempt to patient; no answer.   RN CM scheduled next contact call within one week to complete  conference call with patient and patient's daughter to obtain verbal consent to discuss PHI with daughter.   RN CM will continue attempts due to h/o high utilization of services.   Nathaneil Canary, BSN, RN, [place] Management Care Management Coordinator [phone] Direct [phone] Cell [phone] Office [phone] Fax [email or web address]

## 2016-09-09 NOTE — Progress Notes (Signed)
**Note Johnny-Identified via Obfuscation** Subjective:  Patient ID: [name], male    DOB: [birth year]  Age: 82 y.o. MRN: [id number]  CC: panic attacks   HPI [name] presents for Follow-up from emergency room. He's been there 3 times recently for shortness of breath. He was diagnosed with panic attacks. He describes this as a feeling of shortness of breath when he lays down. He's not had any swelling. His energy is very poor. He was admitted to the hospital approximately 5 months ago for a bout of congestive heart failure. Of note is that he has a diminished ejection fraction of 35-40 percent. He also is seen for atrial fibrillation. He reports some palpitations accompanying his symptoms. He has had a recent CT PA that was negative. CT abdomen also negative. Blood work has been unremarkable. Emergency department visit reports reviewed and are appended.   History [name] has a past medical history of Atrial fibrillation ([name]); CAD (coronary artery disease); Cholecystitis; COPD (chronic obstructive pulmonary disease) ([name]); Dilated cardiomyopathy ([name]); Drug therapy; Dyslipidemia; Hypertension; and Hypothyroidism.   He has a past surgical history that includes Hip surgery (Left); Cardiac catheterization (05/2007); and Coronary stent placement.   His family history includes Diabetes in his other; Heart attack in his father and mother; Stroke in his other. He was adopted.He reports that he has never smoked. He has never used smokeless tobacco. He reports that he does not drink alcohol or use drugs.  Echocardiogram done June 20 of this year shows ejection fraction 35-40%.Holter matter Holter monitor showed frequent PVCs and NSVT he had a CT pulmonary angiogram on 09/02/2016 showing no sign of pulmonary embolism. Chest x-ray showed cardiomegaly.  ROS Review of Systems  Constitutional: Negative for chills, diaphoresis, fever and unexpected weight change.  HENT: Negative for congestion, hearing loss, rhinorrhea and sore throat.     Eyes: Negative for visual disturbance.  Respiratory: Negative for cough and shortness of breath.   Cardiovascular: Negative for chest pain.  Gastrointestinal: Negative for abdominal pain, constipation and diarrhea.  Genitourinary: Negative for dysuria and flank pain.  Musculoskeletal: Negative for arthralgias and joint swelling.  Skin: Negative for rash.  Neurological: Negative for dizziness and headaches.  Psychiatric/Behavioral: Negative for dysphoric mood and sleep disturbance.    Objective:  BP (!) 86/57 (BP Location: Left Arm, Patient Position: Sitting, Cuff Size: Normal)   Pulse 86   Temp 97 F (36.1 C) (Oral)   Resp 18   SpO2 97%   BP Readings from Last 3 Encounters:  09/09/16 (!) 86/57  09/06/16 149/86  09/03/16 128/76    Wt Readings from Last 3 Encounters:  09/06/16 166 lb (75.3 kg)  09/03/16 166 lb (75.3 kg)  09/02/16 166 lb (75.3 kg)     Physical Exam  Constitutional: He is oriented to person, place, and time. He appears well-developed and well-nourished. He appears distressed (nxious).  HENT:  Head: Normocephalic and atraumatic.  Right Ear: External ear normal.  Left Ear: External ear normal.  Nose: Nose normal.  Mouth/Throat: Oropharynx is clear and moist.  Eyes: Conjunctivae and EOM are normal. Pupils are equal, round, and reactive to light.  Neck: Normal range of motion. Neck supple. No thyromegaly present.  Cardiovascular: Normal rate, regular rhythm and normal heart sounds.   No murmur heard. Pulmonary/Chest: Effort normal and breath sounds normal. No respiratory distress. He has no wheezes. He has no rales.  Abdominal: Soft. Bowel sounds are normal. He exhibits no distension. There is no tenderness.  Lymphadenopathy:    He has  no cervical adenopathy.  Neurological: He is alert and oriented to person, place, and time. He has normal reflexes.  Skin: Skin is warm and dry.  Psychiatric: His behavior is normal. Judgment and thought content normal. His  affect is blunt. His speech is delayed.     Lab Results  Component Value Date   WBC 5.3 09/06/2016   HGB 14.0 09/06/2016   HCT 42.3 09/06/2016   PLT 245 09/06/2016   GLUCOSE 147 (H) 09/06/2016   CHOL 188 12/26/2015   TRIG 360 (H) 12/26/2015   HDL 25 (L) 12/26/2015   LDLCALC 91 12/26/2015   ALT 12 (L) 09/06/2016   AST 20 09/06/2016   NA 138 09/06/2016   K 4.0 09/06/2016   CL 107 09/06/2016   CREATININE 1.17 09/06/2016   BUN 13 09/06/2016   CO2 21 (L) 09/06/2016   TSH 0.096 (L) 07/05/2016   INR 2.6 (H) 09/09/2016   HGBA1C 6.1 07/14/2015    Dg Chest Port 1 View  Result Date: 09/06/2016 CLINICAL DATA:  Shortness of breath . EXAM: PORTABLE CHEST 1 VIEW COMPARISON:  CT 09/02/2016.  Chest x-ray 09/02/2016. FINDINGS: Mediastinum hilar structures normal. Cardiomegaly. No evidence of overt congestive heart failure. No pleural effusion or pneumothorax. Left costophrenic angle not imaged. No acute bony abnormality . IMPRESSION: 1. Cardiomegaly noted. No evidence of overt congestive heart failure. 2. No focal pulmonary infiltrates. Electronically Signed   By: Marcello Moores  Register   On: 09/06/2016 11:22    Assessment & Plan:   [name] was seen today for panic attacks.  Diagnoses and all orders for this visit:  SOB (shortness of breath) -     PR BREATHING CAPACITY TEST -     CBC with Differential/Platelet -     CMP14+EGFR -     TSH + free T4 -     Brain natriuretic peptide  Paroxysmal atrial fibrillation (HCC) -     CoaguChek XS/INR Waived  Other orders -     LORazepam (ATIVAN) 0.5 MG tablet; Take 1 tablet (0.5 mg total) by mouth 2 (two) times daily as needed for anxiety. -     carvedilol (COREG) 12.5 MG tablet; Take 0.5 tablets (6.25 mg total) by mouth 2 (two) times daily with a meal. -     digoxin (LANOXIN) 0.125 MG tablet; Take 1 tablet (125 mcg total) by mouth daily.  I am concerned that his symptoms at least in part regarding shortness of breath may be related to his ongoing  use of carvedilol. He does have a great deal of anxiety will go ahead with a short-term temporary prescription for lorazepam. Since his heart rate is high normal and he needs rate control for his atrial fibrillation for his situation a low-dose of digoxin seems appropriate.   I have discontinued [name] linaclotide and LORazepam. I have also changed his carvedilol. Additionally, I am having him start on LORazepam and digoxin. Lastly, I am having him maintain his NITROSTAT, acetaminophen, amLODipine, escitalopram, fenofibrate micronized, levothyroxine, polyethylene glycol powder, warfarin, gabapentin, donepezil, tamsulosin, furosemide, and benzonatate.  Meds ordered this encounter  Medications  . LORazepam (ATIVAN) 0.5 MG tablet    Sig: Take 1 tablet (0.5 mg total) by mouth 2 (two) times daily as needed for anxiety.    Dispense:  30 tablet    Refill:  1  . carvedilol (COREG) 12.5 MG tablet    Sig: Take 0.5 tablets (6.25 mg total) by mouth 2 (two) times daily with a meal.  Dispense:  30 tablet    Refill:  0  . digoxin (LANOXIN) 0.125 MG tablet    Sig: Take 1 tablet (125 mcg total) by mouth daily.    Dispense:  30 tablet    Refill:  2     Follow-up: Return in about 2 weeks (around 09/23/2016).  Claretta Fraise, M.D.

## 2016-09-10 ENCOUNTER — Ambulatory Visit: Payer: Self-pay

## 2016-09-10 LAB — CMP14+EGFR
A/G RATIO: 1.7 (ref 1.2–2.2)
ALBUMIN: 4.5 g/dL (ref 3.5–4.7)
ALK PHOS: 56 IU/L (ref 39–117)
ALT: 11 IU/L (ref 0–44)
AST: 17 IU/L (ref 0–40)
BUN / CREAT RATIO: 18 (ref 10–24)
BUN: 19 mg/dL (ref 8–27)
Bilirubin Total: 0.3 mg/dL (ref 0.0–1.2)
CALCIUM: 9.6 mg/dL (ref 8.6–10.2)
CO2: 19 mmol/L (ref 18–29)
Chloride: 102 mmol/L (ref 96–106)
Creatinine, Ser: 1.05 mg/dL (ref 0.76–1.27)
GFR calc Af Amer: 73 mL/min/{1.73_m2} (ref >59)
GFR, EST NON AFRICAN AMERICAN: 63 mL/min/{1.73_m2} (ref >59)
GLOBULIN, TOTAL: 2.7 g/dL (ref 1.5–4.5)
Glucose: 115 mg/dL — ABNORMAL HIGH (ref 65–99)
POTASSIUM: 4.6 mmol/L (ref 3.5–5.2)
SODIUM: 143 mmol/L (ref 134–144)
Total Protein: 7.2 g/dL (ref 6.0–8.5)

## 2016-09-10 LAB — CBC WITH DIFFERENTIAL/PLATELET
Basophils Absolute: 0 10*3/uL (ref 0.0–0.2)
Basos: 0 %
EOS (ABSOLUTE): 0.3 10*3/uL (ref 0.0–0.4)
EOS: 4 %
HEMATOCRIT: 44 % (ref 37.5–51.0)
HEMOGLOBIN: 14.4 g/dL (ref 12.6–17.7)
Immature Grans (Abs): 0 10*3/uL (ref 0.0–0.1)
Immature Granulocytes: 0 %
LYMPHS ABS: 1.5 10*3/uL (ref 0.7–3.1)
Lymphs: 23 %
MCH: 25.6 pg — ABNORMAL LOW (ref 26.6–33.0)
MCHC: 32.7 g/dL (ref 31.5–35.7)
MCV: 78 fL — AB (ref 79–97)
MONOS ABS: 0.4 10*3/uL (ref 0.1–0.9)
Monocytes: 6 %
Neutrophils Absolute: 4.3 10*3/uL (ref 1.4–7.0)
Neutrophils: 67 %
Platelets: 373 10*3/uL (ref 150–379)
RBC: 5.62 x10E6/uL (ref 4.14–5.80)
RDW: 17.7 % — AB (ref 12.3–15.4)
WBC: 6.6 10*3/uL (ref 3.4–10.8)

## 2016-09-10 LAB — BRAIN NATRIURETIC PEPTIDE: BNP: 267.3 pg/mL — ABNORMAL HIGH (ref 0.0–100.0)

## 2016-09-10 LAB — TSH+FREE T4
FREE T4: 1.35 ng/dL (ref 0.82–1.77)
TSH: 1.77 u[IU]/mL (ref 0.450–4.500)

## 2016-09-11 ENCOUNTER — Ambulatory Visit: Payer: Self-pay

## 2016-09-11 ENCOUNTER — Encounter: Payer: Self-pay | Admitting: Family Medicine

## 2016-09-11 ENCOUNTER — Ambulatory Visit (INDEPENDENT_AMBULATORY_CARE_PROVIDER_SITE_OTHER): Payer: Medicare Other | Admitting: Family Medicine

## 2016-09-11 VITALS — BP 115/70 | HR 111 | Temp 97.4°F | Ht 68.0 in | Wt 175.0 lb

## 2016-09-11 DIAGNOSIS — Z7901 Long term (current) use of anticoagulants: Secondary | ICD-10-CM | POA: Diagnosis not present

## 2016-09-11 DIAGNOSIS — I480 Paroxysmal atrial fibrillation: Secondary | ICD-10-CM | POA: Diagnosis not present

## 2016-09-11 DIAGNOSIS — M1712 Unilateral primary osteoarthritis, left knee: Secondary | ICD-10-CM

## 2016-09-11 NOTE — Progress Notes (Signed)
Subjective:  Patient ID: [name], male    DOB: 1926-04-03  Age: 82 y.o. MRN: [id number]  CC: Knee Pain (left)   HPI Barclay Look presents for Increasing pain in the left knee. It gives way on him when he walks. It just feels weak and then it'll give out. There is moderate pain. He notices no swelling. Chronic problem. Recently he's not had as much trouble with the knee although his breathing has been an issue. Today he says the breathing is much better. However now the knee problem is back. He requests a cortisone injection into the joint.   History Tevonte has a past medical history of Atrial fibrillation ([place]); CAD (coronary artery disease); Cholecystitis; COPD (chronic obstructive pulmonary disease) ([place]); Dilated cardiomyopathy ([place]); Drug therapy; Dyslipidemia; Hypertension; and Hypothyroidism.   He has a past surgical history that includes Hip surgery (Left); Cardiac catheterization (05/2007); and Coronary stent placement.   His family history includes Diabetes in his other; Heart attack in his father and mother; Stroke in his other. He was adopted.He reports that he has never smoked. He has never used smokeless tobacco. He reports that he does not drink alcohol or use drugs.    ROS Review of Systems  Constitutional: Positive for activity change. Negative for appetite change, chills, diaphoresis and fever.  HENT: Positive for hearing loss. Negative for trouble swallowing.   Respiratory: Positive for shortness of breath (improving).   Cardiovascular: Negative for chest pain.  Gastrointestinal: Negative for abdominal pain.  Musculoskeletal: Positive for arthralgias (worst at left knee). Negative for myalgias.  Skin: Negative for rash.  Neurological: Negative for weakness and headaches.    Objective:  BP 115/70   Pulse (!) 111   Temp 97.4 F (36.3 C) (Oral)   Ht 5\' 8"  (1.727 m)   Wt 175 lb (79.4 kg)   BMI 26.61 kg/m   BP Readings from Last 3 Encounters:  09/11/16  115/70  09/09/16 (!) 86/57  09/06/16 149/86    Wt Readings from Last 3 Encounters:  09/11/16 175 lb (79.4 kg)  09/06/16 166 lb (75.3 kg)  09/03/16 166 lb (75.3 kg)     Physical Exam  Constitutional: He appears well-developed and well-nourished.  HENT:  Head: Normocephalic and atraumatic.  Right Ear: Tympanic membrane and external ear normal. No decreased hearing is noted.  Left Ear: Tympanic membrane and external ear normal. No decreased hearing is noted.  Mouth/Throat: No oropharyngeal exudate or posterior oropharyngeal erythema.  Eyes: Pupils are equal, round, and reactive to light.  Neck: Normal range of motion. Neck supple.  Cardiovascular: Normal rate and regular rhythm.   No murmur heard. Pulmonary/Chest: Breath sounds normal. No respiratory distress.  Abdominal: Soft. Bowel sounds are normal. He exhibits no mass. There is no tenderness.  Vitals reviewed.  A steroid injection was performed at  Left knee, lateral aspect  using 1% plain Lidocaine and 9 mg of Celestone. This was well tolerated.   Lab Results  Component Value Date   WBC 6.6 09/09/2016   HGB 14.0 09/06/2016   HCT 44.0 09/09/2016   PLT 373 09/09/2016   GLUCOSE 115 (H) 09/09/2016   CHOL 188 12/26/2015   TRIG 360 (H) 12/26/2015   HDL 25 (L) 12/26/2015   LDLCALC 91 12/26/2015   ALT 11 09/09/2016   AST 17 09/09/2016   NA 143 09/09/2016   K 4.6 09/09/2016   CL 102 09/09/2016   CREATININE 1.05 09/09/2016   BUN 19 09/09/2016   CO2 19 09/09/2016  TSH 1.770 09/09/2016   INR 2.6 (H) 09/09/2016   HGBA1C 6.1 07/14/2015    Dg Chest Port 1 View  Result Date: 09/06/2016 CLINICAL DATA:  Shortness of breath . EXAM: PORTABLE CHEST 1 VIEW COMPARISON:  CT 09/02/2016.  Chest x-ray 09/02/2016. FINDINGS: Mediastinum hilar structures normal. Cardiomegaly. No evidence of overt congestive heart failure. No pleural effusion or pneumothorax. Left costophrenic angle not imaged. No acute bony abnormality . IMPRESSION: 1.  Cardiomegaly noted. No evidence of overt congestive heart failure. 2. No focal pulmonary infiltrates. Electronically Signed   By: Marcello Moores  Register   On: 09/06/2016 11:22    Assessment & Plan:   Kristine was seen today for knee pain.  Diagnoses and all orders for this visit:  Arthritis of left knee  Long term current use of anticoagulant therapy  Paroxysmal atrial fibrillation ([place])      I am having Mr. Morrissette maintain his NITROSTAT, acetaminophen, amLODipine, escitalopram, fenofibrate micronized, levothyroxine, polyethylene glycol powder, warfarin, donepezil, tamsulosin, furosemide, benzonatate, LORazepam, carvedilol, digoxin, and gabapentin.  No orders of the defined types were placed in this encounter.    Follow-up: No Follow-up on file.  Claretta Fraise, M.D.

## 2016-09-13 ENCOUNTER — Ambulatory Visit: Payer: Self-pay

## 2016-09-13 DIAGNOSIS — R109 Unspecified abdominal pain: Secondary | ICD-10-CM | POA: Diagnosis not present

## 2016-09-13 DIAGNOSIS — C672 Malignant neoplasm of lateral wall of bladder: Secondary | ICD-10-CM | POA: Diagnosis not present

## 2016-09-16 ENCOUNTER — Ambulatory Visit: Payer: Self-pay

## 2016-09-17 ENCOUNTER — Encounter (HOSPITAL_COMMUNITY): Payer: Self-pay

## 2016-09-17 ENCOUNTER — Emergency Department (HOSPITAL_COMMUNITY): Payer: Medicare Other

## 2016-09-17 ENCOUNTER — Emergency Department (HOSPITAL_COMMUNITY)
Admission: EM | Admit: 2016-09-17 | Discharge: 2016-09-17 | Disposition: A | Discharge status: Home / Self Care | Payer: Medicare Other | Attending: Emergency Medicine | Admitting: Emergency Medicine

## 2016-09-17 ENCOUNTER — Ambulatory Visit: Payer: Self-pay

## 2016-09-17 DIAGNOSIS — Z79899 Other long term (current) drug therapy: Secondary | ICD-10-CM | POA: Diagnosis not present

## 2016-09-17 DIAGNOSIS — Z955 Presence of coronary angioplasty implant and graft: Secondary | ICD-10-CM | POA: Diagnosis not present

## 2016-09-17 DIAGNOSIS — M25461 Effusion, right knee: Secondary | ICD-10-CM | POA: Diagnosis not present

## 2016-09-17 DIAGNOSIS — W108XXA Fall (on) (from) other stairs and steps, initial encounter: Secondary | ICD-10-CM | POA: Insufficient documentation

## 2016-09-17 DIAGNOSIS — I2510 Atherosclerotic heart disease of native coronary artery without angina pectoris: Secondary | ICD-10-CM | POA: Insufficient documentation

## 2016-09-17 DIAGNOSIS — Z7901 Long term (current) use of anticoagulants: Secondary | ICD-10-CM | POA: Diagnosis not present

## 2016-09-17 DIAGNOSIS — J449 Chronic obstructive pulmonary disease, unspecified: Secondary | ICD-10-CM | POA: Insufficient documentation

## 2016-09-17 DIAGNOSIS — W19XXXA Unspecified fall, initial encounter: Secondary | ICD-10-CM

## 2016-09-17 DIAGNOSIS — Y9389 Activity, other specified: Secondary | ICD-10-CM | POA: Diagnosis not present

## 2016-09-17 DIAGNOSIS — I110 Hypertensive heart disease with heart failure: Secondary | ICD-10-CM | POA: Diagnosis not present

## 2016-09-17 DIAGNOSIS — E039 Hypothyroidism, unspecified: Secondary | ICD-10-CM | POA: Diagnosis not present

## 2016-09-17 DIAGNOSIS — I5022 Chronic systolic (congestive) heart failure: Secondary | ICD-10-CM | POA: Insufficient documentation

## 2016-09-17 DIAGNOSIS — M7989 Other specified soft tissue disorders: Secondary | ICD-10-CM | POA: Diagnosis not present

## 2016-09-17 DIAGNOSIS — S8001XA Contusion of right knee, initial encounter: Secondary | ICD-10-CM | POA: Insufficient documentation

## 2016-09-17 DIAGNOSIS — Y999 Unspecified external cause status: Secondary | ICD-10-CM | POA: Diagnosis not present

## 2016-09-17 DIAGNOSIS — S8002XA Contusion of left knee, initial encounter: Secondary | ICD-10-CM | POA: Insufficient documentation

## 2016-09-17 DIAGNOSIS — S8992XA Unspecified injury of left lower leg, initial encounter: Secondary | ICD-10-CM | POA: Diagnosis present

## 2016-09-17 DIAGNOSIS — R0602 Shortness of breath: Secondary | ICD-10-CM | POA: Diagnosis not present

## 2016-09-17 DIAGNOSIS — Y929 Unspecified place or not applicable: Secondary | ICD-10-CM | POA: Diagnosis not present

## 2016-09-17 LAB — CBC WITH DIFFERENTIAL/PLATELET
BASOS PCT: 0 %
Basophils Absolute: 0 10*3/uL (ref 0.0–0.1)
Eosinophils Absolute: 0.1 10*3/uL (ref 0.0–0.7)
Eosinophils Relative: 2 %
HEMATOCRIT: 43.4 % (ref 39.0–52.0)
Hemoglobin: 14 g/dL (ref 13.0–17.0)
Lymphocytes Relative: 16 %
Lymphs Abs: 1.2 10*3/uL (ref 0.7–4.0)
MCH: 25.3 pg — ABNORMAL LOW (ref 26.0–34.0)
MCHC: 32.3 g/dL (ref 30.0–36.0)
MCV: 78.3 fL (ref 78.0–100.0)
MONOS PCT: 11 %
Monocytes Absolute: 0.9 10*3/uL (ref 0.1–1.0)
NEUTROS ABS: 5.4 10*3/uL (ref 1.7–7.7)
Neutrophils Relative %: 71 %
PLATELETS: 348 10*3/uL (ref 150–400)
RBC: 5.54 MIL/uL (ref 4.22–5.81)
RDW: 16.2 % — AB (ref 11.5–15.5)
WBC: 7.6 10*3/uL (ref 4.0–10.5)

## 2016-09-17 LAB — BRAIN NATRIURETIC PEPTIDE: B Natriuretic Peptide: 449 pg/mL — ABNORMAL HIGH (ref 0.0–100.0)

## 2016-09-17 LAB — COMPREHENSIVE METABOLIC PANEL
ALBUMIN: 3.8 g/dL (ref 3.5–5.0)
ALT: 16 U/L — ABNORMAL LOW (ref 17–63)
ANION GAP: 9 (ref 5–15)
AST: 18 U/L (ref 15–41)
Alkaline Phosphatase: 48 U/L (ref 38–126)
BILIRUBIN TOTAL: 1 mg/dL (ref 0.3–1.2)
BUN: 23 mg/dL — ABNORMAL HIGH (ref 6–20)
CO2: 23 mmol/L (ref 22–32)
Calcium: 8.7 mg/dL — ABNORMAL LOW (ref 8.9–10.3)
Chloride: 103 mmol/L (ref 101–111)
Creatinine, Ser: 1.13 mg/dL (ref 0.61–1.24)
GFR calc Af Amer: >60 mL/min (ref >60)
GFR, EST NON AFRICAN AMERICAN: 56 mL/min — AB (ref >60)
GLUCOSE: 127 mg/dL — AB (ref 65–99)
POTASSIUM: 3.5 mmol/L (ref 3.5–5.1)
Sodium: 135 mmol/L (ref 135–145)
TOTAL PROTEIN: 7 g/dL (ref 6.5–8.1)

## 2016-09-17 LAB — I-STAT TROPONIN, ED: TROPONIN I, POC: 0.02 ng/mL (ref 0.00–0.08)

## 2016-09-17 MED ORDER — HYDROCODONE-ACETAMINOPHEN 5-325 MG PO TABS: ORAL_TABLET | ORAL | 0 refills | Status: AC

## 2016-09-17 MED ORDER — HYDROCODONE-ACETAMINOPHEN 5-325 MG PO TABS
1.0000 | ORAL_TABLET | Freq: Once | ORAL | Status: AC
  Administered 2016-09-17: 1 via ORAL
  Filled 2016-09-17: qty 1

## 2016-09-17 NOTE — Discharge Instructions (Signed)
Keep legs elevated.  Follow up with your family md for recheck next week

## 2016-09-17 NOTE — ED Notes (Signed)
Pt returned from xray

## 2016-09-17 NOTE — ED Provider Notes (Signed)
[facility] DEPT Provider Note   CSN: [id number] Arrival date & time: 09/17/16  1054     History   Chief Complaint Chief Complaint  Patient presents with  . Fall    HPI [name] is a 82 y.o. male.  Patient states that he fell onto his knees yesterday. Friends and family members help to get up. Patient has had some shortness of breath but this is been stable   The history is provided by the patient.  Fall  This is a new problem. The current episode started yesterday. The problem occurs rarely. The problem has been resolved. Pertinent negatives include no chest pain, no abdominal pain and no headaches. Nothing aggravates the symptoms. Nothing relieves the symptoms. He has tried nothing for the symptoms.    Past Medical History:  Diagnosis Date  . Atrial fibrillation (HCC)    Persistent, treated with amiodarone and cardioversion  . CAD (coronary artery disease)    Last catheterization in August 2008 demonstrated the LAD 40% stenosis  . Cholecystitis   . COPD (chronic obstructive pulmonary disease) ([place])   . Dilated cardiomyopathy ([place])    Felt to be secondary to atrial fibrillation. EF is about 40%  . Drug therapy    Chronic Coumadin therapy  . Dyslipidemia   . Hypertension   . Hypothyroidism    Possibly related to amiodarone    Patient Active Problem List   Diagnosis Date Noted  . Persistent atrial fibrillation ([place])   . Acute epigastric pain 08/27/2016  . Dementia 08/27/2016  . Cholelithiasis 08/27/2016  . Bladder wall thickening 08/27/2016  . Calculus of gallbladder with chronic cholecystitis without obstruction   . Arthritis of left knee 07/30/2016  . Chronic systolic congestive heart failure ([place]) 04/01/2016  . Insomnia 03/06/2016  . Dyslipidemia 12/26/2015  . Fecal soiling due to fecal incontinence 09/12/2015  . BPH (benign prostatic hyperplasia) 08/02/2015  . Arthritis, hip 08/02/2015  . At high risk for falls 07/14/2015  . Hereditary and  idiopathic peripheral neuropathy 03/06/2015  . Pre-diabetes 12/23/2014  . Hypothyroid 11/18/2013  . Long term current use of anticoagulant therapy 10/05/2013  . Paresthesias 10/05/2013  . Paroxysmal atrial fibrillation ([place]) 01/25/2013  . Bruit 11/20/2011  . Coronary atherosclerosis 12/12/2010  . Hypertensive heart and renal disease with renal failure 01/05/2009  . CARDIOMYOPATHY 01/05/2009    Past Surgical History:  Procedure Laterality Date  . CARDIAC CATHETERIZATION  05/2007   LAD 40% stenosis. Stent in the LAD had 20% in-stent restenosis. Followed by 40-50% distal stenosis. 50% diagonal stenosis. 40-50% circumflex stenosis. 70% OM1 stenosis and 30% ostial RCA stenosis. 30% proximal RCA stenosis.  . CORONARY STENT PLACEMENT    . HIP SURGERY Left    82yo       Home Medications    Prior to Admission medications   Medication Sig Start Date End Date Taking? Authorizing Provider  warfarin (COUMADIN) 5 MG tablet Take 5 mg once daily except on Monday and Friday take 7.5 mg. 07/30/16  Yes Claretta Fraise, MD  acetaminophen (TYLENOL) 500 MG tablet Take 1-2 tablets (500-1,000 mg total) by mouth 2 (two) times daily as needed. Patient taking differently: Take 500-1,000 mg by mouth 2 (two) times daily as needed for mild pain.  03/28/16   Cherre Robins, PharmD  amLODipine (NORVASC) 10 MG tablet TAKE 1 TABLET DAILY 07/12/16   Minus Breeding, MD  benzonatate (TESSALON) 200 MG capsule Take 200 mg by mouth 3 (three) times daily as needed for cough.  Historical Provider, MD  carvedilol (COREG) 12.5 MG tablet Take 0.5 tablets (6.25 mg total) by mouth 2 (two) times daily with a meal. 09/09/16   Claretta Fraise, MD  digoxin (LANOXIN) 0.125 MG tablet Take 1 tablet (125 mcg total) by mouth daily. 09/09/16   Claretta Fraise, MD  donepezil (ARICEPT) 5 MG tablet Take 1 tablet (5 mg total) by mouth at bedtime. To help with memory and to help think more clearly 08/06/16   Claretta Fraise, MD  escitalopram (LEXAPRO)  5 MG tablet Take 1 tablet (5 mg total) by mouth daily. 07/17/16   Claretta Fraise, MD  fenofibrate micronized (LOFIBRA) 134 MG capsule TAKE (1) CAPSULE DAILY BEFORE BREAKFAST. 07/18/16   Claretta Fraise, MD  furosemide (LASIX) 20 MG tablet TAKE (1) TABLET DAILY IN THE MORNING. 08/22/16   Claretta Fraise, MD  gabapentin (NEURONTIN) 600 MG tablet Take 1 tablet (600 mg total) by mouth 3 (three) times daily. 09/10/16   Claretta Fraise, MD  HYDROcodone-acetaminophen (NORCO/VICODIN) 5-325 MG tablet Take one every 6-8 hours for pain not helped by tylenol alone 09/17/16   Milton Ferguson, MD  levothyroxine (SYNTHROID, LEVOTHROID) 125 MCG tablet TAKE 1 TABLET DAILY 07/24/16   Claretta Fraise, MD  LORazepam (ATIVAN) 0.5 MG tablet Take 1 tablet (0.5 mg total) by mouth 2 (two) times daily as needed for anxiety. 09/09/16   Claretta Fraise, MD  NITROSTAT 0.4 MG SL tablet Place 1 tablet (0.4 mg total) under the tongue every 5 (five) minutesas needed for chest pain. 11/14/15   Claretta Fraise, MD  polyethylene glycol powder (GLYCOLAX/MIRALAX) powder Take 17 g by mouth daily as needed. For constipation 07/24/16   Claretta Fraise, MD  tamsulosin (FLOMAX) 0.4 MG CAPS capsule Take 2 capsules (0.8 mg total) by mouth daily after supper. For urine flow and prostate 08/06/16   Claretta Fraise, MD    Family History Family History  Problem Relation Age of Onset  . Adopted: Yes  . Heart attack Mother   . Heart attack Father   . Diabetes Other   . Stroke Other     Social History Social History  Substance Use Topics  . Smoking status: Never Smoker  . Smokeless tobacco: Never Used  . Alcohol use No     Comment: quit at age 49yo     Allergies   Amiodarone and Penicillins   Review of Systems Review of Systems  Constitutional: Negative for appetite change and fatigue.  HENT: Negative for congestion, ear discharge and sinus pressure.   Eyes: Negative for discharge.  Respiratory: Negative for cough.        Shortness of breath    Cardiovascular: Negative for chest pain.  Gastrointestinal: Negative for abdominal pain and diarrhea.  Genitourinary: Negative for frequency and hematuria.  Musculoskeletal: Negative for back pain.  Skin: Negative for rash.  Neurological: Negative for seizures and headaches.  Psychiatric/Behavioral: Negative for hallucinations.     Physical Exam Updated Vital Signs BP 121/58   Pulse 82   Temp 97.8 F (36.6 C) (Oral)   Resp 26   Ht 5\' 8"  (1.727 m)   Wt 175 lb (79.4 kg)   SpO2 94%   BMI 26.61 kg/m   Physical Exam  Constitutional: He is oriented to person, place, and time. He appears well-developed.  HENT:  Head: Normocephalic.  Eyes: Conjunctivae and EOM are normal. No scleral icterus.  Neck: Neck supple. No thyromegaly present.  Cardiovascular: Normal rate and regular rhythm.  Exam reveals no gallop and no friction rub.  No murmur heard. Pulmonary/Chest: No stridor. He has no wheezes. He has no rales. He exhibits no tenderness.  Abdominal: He exhibits no distension. There is no tenderness. There is no rebound.  Musculoskeletal: Normal range of motion. He exhibits no edema.  Tenderness to both knees with mild swelling to left knee  Lymphadenopathy:    He has no cervical adenopathy.  Neurological: He is oriented to person, place, and time. He exhibits normal muscle tone. Coordination normal.  Skin: No rash noted. No erythema.  Psychiatric: He has a normal mood and affect. His behavior is normal.     ED Treatments / Results  Labs (all labs ordered are listed, but only abnormal results are displayed) Labs Reviewed  BRAIN NATRIURETIC PEPTIDE - Abnormal; Notable for the following:       Result Value   B Natriuretic Peptide 449.0 (*)    All other components within normal limits  CBC WITH DIFFERENTIAL/PLATELET - Abnormal; Notable for the following:    MCH 25.3 (*)    RDW 16.2 (*)    All other components within normal limits  COMPREHENSIVE METABOLIC PANEL - Abnormal;  Notable for the following:    Glucose, Bld 127 (*)    BUN 23 (*)    Calcium 8.7 (*)    ALT 16 (*)    GFR calc non Af Amer 56 (*)    All other components within normal limits  I-STAT TROPOININ, ED    EKG  EKG Interpretation  Date/Time:  Tuesday September 17 2016 12:51:05 EST Ventricular Rate:  78 PR Interval:    QRS Duration: 141 QT Interval:  424 QTC Calculation: 483 R Axis:   9 Text Interpretation:  Atrial fibrillation Multiple ventricular premature complexes IVCD, consider atypical LBBB Baseline wander in lead(s) I III aVL V2 Confirmed by Creig Landin  MD, Jaylynne Birkhead [phone]) on 09/17/2016 3:10:18 PM       Radiology Dg Chest 2 View  Result Date: 09/17/2016 CLINICAL DATA:  Shortness of breath since last night EXAM: CHEST  2 VIEW COMPARISON:  September 06, 2016 FINDINGS: The heart size and mediastinal contours are stable. The heart size is upper limits normal. There is no focal infiltrate, pulmonary edema, or pleural effusion. Mild central pulmonary vascular congestion is identified. Degenerative joint changes of the spine are noted. IMPRESSION: Mild central pulmonary vascular congestion without frank edema. Electronically Signed   By: Abelardo Diesel M.D.   On: 09/17/2016 12:49   Dg Knee Complete 4 Views Left  Result Date: 09/17/2016 CLINICAL DATA:  Generalized pain and swelling after falling down steps yesterday. EXAM: LEFT KNEE - COMPLETE 4+ VIEW COMPARISON:  Multiple radiographic and MRI exam same year FINDINGS: There is a large knee joint effusion. No acute fracture. No way compartment joint space narrowing or patellofemoral osteoarthritis. Calcification in Hoffa's fat pad could be calcified fat necrosis or a chondroma as noted at MRI. IMPRESSION: No acute finding. Joint effusion which has been present on previous studies and felt secondary to synovitis. Benign calcification in Hoffa's fat pad that could be fat necrosis or a chondroma. Electronically Signed   By: Nelson Chimes M.D.   On:  09/17/2016 11:41   Dg Knee Complete 4 Views Right  Result Date: 09/17/2016 CLINICAL DATA:  [address] down stairs landing on the knee EXAM: RIGHT KNEE - COMPLETE 4+ VIEW COMPARISON:  03/04/2016 FINDINGS: Small knee joint effusion. No osteoarthritic joint space narrowing. No fracture. Regional vascular calcification incidentally noted. IMPRESSION: Small joint effusion.  Otherwise negative. Electronically Signed  By: Nelson Chimes M.D.   On: 09/17/2016 11:42    Procedures Procedures (including critical care time)  Medications Ordered in ED Medications - No data to display   Initial Impression / Assessment and Plan / ED Course  I have reviewed the triage vital signs and the nursing notes.  Pertinent labs & imaging results that were available during my care of the patient were reviewed by me and considered in my medical decision making (see chart for details).  Clinical Course     Patient with contusion to both knees. Also stable congestive heart failure. Patient will be treated with rest elevation and Ace bandage Vicodin and follow-up with PCP  Final Clinical Impressions(s) / ED Diagnoses   Final diagnoses:  Fall, initial encounter    New Prescriptions New Prescriptions   HYDROCODONE-ACETAMINOPHEN (NORCO/VICODIN) 5-325 MG TABLET    Take one every 6-8 hours for pain not helped by tylenol alone     Milton Ferguson, MD 09/17/16 1520

## 2016-09-17 NOTE — ED Notes (Signed)
Pt taken to xray 

## 2016-09-17 NOTE — ED Triage Notes (Signed)
Pt reports tripped and fell onto both knees yesterday while going down 2 steps.  Pt c/o pain and swelling to knees.  No bruising noted.  Also reports some SOB.  Denies any dizziness or loss of consciousness.

## 2016-09-18 ENCOUNTER — Telehealth: Payer: Self-pay | Admitting: Family Medicine

## 2016-09-18 ENCOUNTER — Telehealth: Payer: Self-pay | Admitting: Cardiology

## 2016-09-18 NOTE — Telephone Encounter (Signed)
Routed for Dr. Rosezella Florida review.

## 2016-09-18 NOTE — Telephone Encounter (Signed)
Request for surgical clearance:  1. What type of surgery is being performed? TUR of a bladder tumor   2. When is this surgery scheduled? Surgery not scheduled   3. Are there any medications that need to be held prior to surgery and how long?General Cardiac Clearance  4. Name of physician performing surgery? Dr Doristine Bosworth   5. What is your office phone and fax number? [phone] and fax number is [phone]

## 2016-09-18 NOTE — Telephone Encounter (Signed)
Spoke with Pam at [facility].  They are also checking with Dr Jenkins Rouge about holding warfarin.  They will let me know when surgery is scheduled and will give plan for holding warfarin.  Will check INR day prior to surgery.

## 2016-09-18 NOTE — Telephone Encounter (Signed)
The patient has no high risk unstable symptoms.  No further cardiovascular testing is indicated.  Therefore, based on ACC/AHA guidelines, the patient would be at acceptable risk for the planned procedure without further cardiovascular testing.  He can stop the warfarin 5 days prior to surgery.  Call [name] with the results and send results to [facility], MD

## 2016-09-19 ENCOUNTER — Ambulatory Visit: Payer: Self-pay

## 2016-09-19 NOTE — Telephone Encounter (Signed)
clearance faxed via epic and faxed machine

## 2016-09-20 ENCOUNTER — Other Ambulatory Visit: Payer: Self-pay

## 2016-09-20 NOTE — Patient Outreach (Addendum)
[facility]) Care Management  09/09/2016  Talvin Borey 28-Aug-1926 [id number]  Telephonic Screening   Referral Date:  08/08/2016 Source:  Dr. Claretta Fraise, [facility], contact:  Glean Salen [phone] Issue:  HF. Hospital admission x 7 (2017).  Really fast deconditioning; increased falls and decreased alertness.  [place]:  [employer] Medicare  Subjective: Outreach call #[phone] to patient.   Patient reached and was able to confirm conversation with his Daughter, Heinz Knuckles [phone].   Patient gives verbal consent to complete calls with daughter and discuss PHI.  Patient states daughter manages all his care.  States his biggest complaint is the pain in his legs which he takes Tylenol for pain management.    Providers: Primary MD:  Dr. Claretta Fraise, [facility] - last appt 08/06/16  Co-morbidities:  Chronic combined systolic and diastolic CHF,  COPD, CAD, Paroxysmal atrial fibrillation, Dilated cardiomyopathy, EF 40%, A-fib, HTN, Hypothyroidism, Chronic Coumadin therapy, Dyslipidemia, Hypertension Arthritis of left knee with pain and swelling with h/o intraarticular chondroma; steroid injection 08/06/16.  Prednisone 10//27/17 Benign prostatic hyperplasia with nocturia with frequent urination during the night about ever 1 hour - tamsulosin/Flomax 08/09/16 Memory issues donepezil/Aricept 08/09/16  Admissions 2 over past 6 months ([place]).  -08/27/16 - 08/28/16 - Acute epigastric pain -04/01/2016 - 04/03/2016  - Acute on chronic combined systolic and diastolic CHF -[id number]  03/28/2015 - CAP  ED visits 7  -09/06/16 - Code STEMI   -09/03/16 - ABD pain and diarrhea -09/02/16 - shortness of breath, chest pain   -08/27/16 - Chest pain / ABD pain  -07/03/16 weakness following starting two new medications for BPH -05/01/16 constant, mild, generalized body aches, worse in the knees and hips onset last night. -04/01/16 ED to  Hospital admission   BP 141/93 09/17/2016 Weight 175 lb (79 kg) 09/17/2016 Height 68 in (173 cm) 09/17/2016 BMI 26.70 (Overweight, Pre-obe 09/17/2016  Lipid Panel completed 12/26/2015 HDL 25.000 12/26/2015 LDL 91.000 12/26/2015 Cholesterol, total 188.000 12/26/2015 Triglycerides 360.000 12/26/2015 A1C 6.400 01/30/2016 Glucose Random 127.000 09/17/2016  Medications: Prevnar (PCV13) 08/01/2014 Pneumovax (PP 08/17/1995 Flu Vaccine 08/22/2016 tDAP Vaccine 03/26/2015  Encounter Medications:  Outpatient Encounter Prescriptions as of 09/20/2016  Medication Sig  . acetaminophen (TYLENOL) 500 MG tablet Take 1-2 tablets (500-1,000 mg total) by mouth 2 (two) times daily as needed. (Patient taking differently: Take 500-1,000 mg by mouth 2 (two) times daily as needed for mild pain. )  . amLODipine (NORVASC) 10 MG tablet TAKE 1 TABLET DAILY  . benzonatate (TESSALON) 200 MG capsule Take 200 mg by mouth 3 (three) times daily as needed for cough.  . carvedilol (COREG) 12.5 MG tablet Take 0.5 tablets (6.25 mg total) by mouth 2 (two) times daily with a meal.  . digoxin (LANOXIN) 0.125 MG tablet Take 1 tablet (125 mcg total) by mouth daily.  Marland Kitchen donepezil (ARICEPT) 5 MG tablet Take 1 tablet (5 mg total) by mouth at bedtime. To help with memory and to help think more clearly  . escitalopram (LEXAPRO) 5 MG tablet Take 1 tablet (5 mg total) by mouth daily.  . fenofibrate micronized (LOFIBRA) 134 MG capsule TAKE (1) CAPSULE DAILY BEFORE BREAKFAST.  . furosemide (LASIX) 20 MG tablet TAKE (1) TABLET DAILY IN THE MORNING.  Marland Kitchen gabapentin (NEURONTIN) 600 MG tablet Take 1 tablet (600 mg total) by mouth 3 (three) times daily.  Marland Kitchen HYDROcodone-acetaminophen (NORCO/VICODIN) 5-325 MG tablet Take one every 6-8 hours for pain not helped by tylenol alone  . levothyroxine (SYNTHROID,  LEVOTHROID) 125 MCG tablet TAKE 1 TABLET DAILY  . LORazepam (ATIVAN) 0.5 MG tablet Take 1 tablet (0.5 mg total) by mouth 2 (two) times daily as needed for  anxiety.  Marland Kitchen NITROSTAT 0.4 MG SL tablet Place 1 tablet (0.4 mg total) under the tongue every 5 (five) minutesas needed for chest pain.  . polyethylene glycol powder (GLYCOLAX/MIRALAX) powder Take 17 g by mouth daily as needed. For constipation  . tamsulosin (FLOMAX) 0.4 MG CAPS capsule Take 2 capsules (0.8 mg total) by mouth daily after supper. For urine flow and prostate  . warfarin (COUMADIN) 5 MG tablet Take 5 mg once daily except on Monday and Friday take 7.5 mg.   No facility-administered encounter medications on file as of 09/20/2016.     Functional Status:  In your present state of health, do you have any difficulty performing the following activities: 09/20/2016 08/28/2016  Hearing? N Y  Vision? N N  Difficulty concentrating or making decisions? N Y  Walking or climbing stairs? N Y  Dressing or bathing? N N  Doing errands, shopping? N Y  Conservation officer, nature and eating ? N -  Using the Toilet? N -  In the past six months, have you accidently leaked urine? N -  Do you have problems with loss of bowel control? N -  Managing your Medications? N -  Managing your Finances? N -  Housekeeping or managing your Housekeeping? N -  Some recent data might be hidden    Fall/Depression Screening: PHQ 2/9 Scores 09/20/2016 09/11/2016 08/06/2016 07/30/2016 07/24/2016 07/17/2016 07/15/2016  PHQ - 2 Score 0 0 0 0 0 0 0  PHQ- 9 Score - - - - - 0 -   Plan: Referral Date:  08/08/2016 Partial Screening 09/20/16 Telephonic RN CM services 09/20/16 Program:  CHF 09/20/16  RN CM advised patient to notify daughter RN CM will outreach to her again within one week to complete Screening and Initial Assessment.   DME, Falls, Preventives (Risk Assessment), HF, Advanced Directives  RN CM mailed Successful Outreach Letter to patient. [facility] notified case opened.   Nathaneil Canary, BSN, RN, [facility] Management Care Management Coordinator [phone] Direct [phone]  Cell [phone] Office [phone] Fax [email or web address]

## 2016-09-23 ENCOUNTER — Other Ambulatory Visit: Payer: Self-pay | Admitting: Urology

## 2016-09-23 DIAGNOSIS — C672 Malignant neoplasm of lateral wall of bladder: Secondary | ICD-10-CM | POA: Insufficient documentation

## 2016-09-23 NOTE — Progress Notes (Signed)
Subjective:  Patient ID: [name], male    DOB: 12/11/1925  Age: 82 y.o. MRN: [id number]  CC: Medical Clearance (pt here today for exam for surgical clearance due to being dx'd with bladder cancer per his friend that is with him today)   HPI [name] presents for Surgical clearance for removal from anticoagulants in order to do transurethral resection of the prostate and bladder. He was recently diagnosed with transitional cell carcinoma and BPH. He has abd. Discomfort and extreme urinary frequency.   Pt. Continues to Have severe bilateral knee pain. It seems to be worsening. If his made it where he can hardly walk. He is limited to the house due to the pain and inability to walk more than a few feet as a result. He is using a cane. That help support him that still he has limits with his ambulation.  His girlfriend is with him and says that she's noticed his memory getting worse. He does not remember being diagnosed with cancer. He doesn't remember having cystoscopy.   History Twan has a past medical history of Atrial fibrillation ([place]); CAD (coronary artery disease); Cholecystitis; COPD (chronic obstructive pulmonary disease) ([place]); Dilated cardiomyopathy ([place]); Drug therapy; Dyslipidemia; Hypertension; and Hypothyroidism.   He has a past surgical history that includes Hip surgery (Left); Cardiac catheterization (05/2007); and Coronary stent placement.   His family history includes Diabetes in his other; Heart attack in his father and mother; Stroke in his other. He was adopted.He reports that he has never smoked. He has never used smokeless tobacco. He reports that he does not drink alcohol or use drugs.    ROS Review of Systems  Constitutional: Negative for chills, diaphoresis, fever and unexpected weight change.  HENT: Negative for congestion, hearing loss, rhinorrhea and sore throat.   Eyes: Negative for visual disturbance.  Respiratory: Negative for cough and shortness  of breath.   Cardiovascular: Negative for chest pain.  Gastrointestinal: Negative for abdominal pain, constipation and diarrhea.  Genitourinary: Negative for dysuria and flank pain.  Musculoskeletal: Negative for arthralgias and joint swelling.  Skin: Negative for rash.  Neurological: Negative for dizziness and headaches.  Psychiatric/Behavioral: Negative for dysphoric mood and sleep disturbance.    Objective:  BP 116/71   Pulse 71   Temp 97.3 F (36.3 C) (Oral)   Ht 5\' 8"  (1.727 m)   Wt 172 lb (78 kg)   BMI 26.15 kg/m   BP Readings from Last 3 Encounters:  09/24/16 116/71  09/17/16 141/93  09/11/16 115/70    Wt Readings from Last 3 Encounters:  09/24/16 172 lb (78 kg)  09/17/16 175 lb (79.4 kg)  09/11/16 175 lb (79.4 kg)     Physical Exam  Constitutional: He is oriented to person, place, and time. He appears well-developed and well-nourished. No distress.  HENT:  Head: Normocephalic and atraumatic.  Right Ear: External ear normal.  Left Ear: External ear normal.  Nose: Nose normal.  Mouth/Throat: Oropharynx is clear and moist.  Eyes: Conjunctivae and EOM are normal. Pupils are equal, round, and reactive to light.  Neck: Normal range of motion. Neck supple. No thyromegaly present.  Cardiovascular: Normal rate, regular rhythm and normal heart sounds.   No murmur heard. Pulmonary/Chest: Effort normal and breath sounds normal. No respiratory distress. He has no wheezes. He has no rales.  Abdominal: Soft. Bowel sounds are normal. He exhibits no distension. There is no tenderness.  Lymphadenopathy:    He has no cervical adenopathy.  Neurological: He  is alert and oriented to person, place, and time. He has normal reflexes.  Skin: Skin is warm and dry.  Psychiatric: He has a normal mood and affect. His behavior is normal. Judgment and thought content normal.     Lab Results  Component Value Date   WBC 7.6 09/17/2016   HGB 14.0 09/17/2016   HCT 43.4 09/17/2016    PLT 348 09/17/2016   GLUCOSE 127 (H) 09/17/2016   CHOL 188 12/26/2015   TRIG 360 (H) 12/26/2015   HDL 25 (L) 12/26/2015   LDLCALC 91 12/26/2015   ALT 16 (L) 09/17/2016   AST 18 09/17/2016   NA 135 09/17/2016   K 3.5 09/17/2016   CL 103 09/17/2016   CREATININE 1.13 09/17/2016   BUN 23 (H) 09/17/2016   CO2 23 09/17/2016   TSH 1.770 09/09/2016   INR 2.6 (H) 09/09/2016   HGBA1C 6.1 07/14/2015    Dg Chest 2 View  Result Date: 09/17/2016 CLINICAL DATA:  Shortness of breath since last night EXAM: CHEST  2 VIEW COMPARISON:  September 06, 2016 FINDINGS: The heart size and mediastinal contours are stable. The heart size is upper limits normal. There is no focal infiltrate, pulmonary edema, or pleural effusion. Mild central pulmonary vascular congestion is identified. Degenerative joint changes of the spine are noted. IMPRESSION: Mild central pulmonary vascular congestion without frank edema. Electronically Signed   By: Abelardo Diesel M.D.   On: 09/17/2016 12:49   Dg Knee Complete 4 Views Left  Result Date: 09/17/2016 CLINICAL DATA:  Generalized pain and swelling after falling down steps yesterday. EXAM: LEFT KNEE - COMPLETE 4+ VIEW COMPARISON:  Multiple radiographic and MRI exam same year FINDINGS: There is a large knee joint effusion. No acute fracture. No way compartment joint space narrowing or patellofemoral osteoarthritis. Calcification in Hoffa's fat pad could be calcified fat necrosis or a chondroma as noted at MRI. IMPRESSION: No acute finding. Joint effusion which has been present on previous studies and felt secondary to synovitis. Benign calcification in Hoffa's fat pad that could be fat necrosis or a chondroma. Electronically Signed   By: Nelson Chimes M.D.   On: 09/17/2016 11:41   Dg Knee Complete 4 Views Right  Result Date: 09/17/2016 CLINICAL DATA:  [place] down stairs landing on the knee EXAM: RIGHT KNEE - COMPLETE 4+ VIEW COMPARISON:  03/04/2016 FINDINGS: Small knee joint effusion. No  osteoarthritic joint space narrowing. No fracture. Regional vascular calcification incidentally noted. IMPRESSION: Small joint effusion.  Otherwise negative. Electronically Signed   By: Nelson Chimes M.D.   On: 09/17/2016 11:42    Assessment & Plan:   Zyron was seen today for medical clearance.  Diagnoses and all orders for this visit:  Benign prostatic hyperplasia with nocturia  Malignant neoplasm of lateral wall of urinary bladder (HCC)  Arthritis of both knees  Vascular dementia without behavioral disturbance  Persistent atrial fibrillation (HCC)  Paroxysmal atrial fibrillation (HCC)  Hypertensive heart and renal disease with renal failure  Other orders -     donepezil (ARICEPT) 10 MG tablet; Take 1 tablet (10 mg total) by mouth at bedtime. To help with memory and to help think more clearly  I have discontinued Mr. Berrocal benzonatate. I have also changed his donepezil. Additionally, I am having him maintain his NITROSTAT, acetaminophen, amLODipine, escitalopram, fenofibrate micronized, levothyroxine, polyethylene glycol powder, warfarin, tamsulosin, furosemide, LORazepam, carvedilol, digoxin, gabapentin, and HYDROcodone-acetaminophen.  Meds ordered this encounter  Medications  . donepezil (ARICEPT) 10 MG tablet  Sig: Take 1 tablet (10 mg total) by mouth at bedtime. To help with memory and to help think more clearly    Dispense:  30 tablet    Refill:  2    I will defer to cardiology for determination of the best way to anticoagulate perioperatively  -  Whether he needs Lovenox and heparin versus continuing or holding Coumadin.   Follow-up: Return in about 6 weeks (around 11/05/2016).  Claretta Fraise, M.D.

## 2016-09-24 ENCOUNTER — Ambulatory Visit (INDEPENDENT_AMBULATORY_CARE_PROVIDER_SITE_OTHER): Payer: Medicare Other | Admitting: Family Medicine

## 2016-09-24 ENCOUNTER — Encounter: Payer: Self-pay | Admitting: Family Medicine

## 2016-09-24 VITALS — BP 116/71 | HR 71 | Temp 97.3°F | Ht 68.0 in | Wt 172.0 lb

## 2016-09-24 DIAGNOSIS — F0150 Vascular dementia without behavioral disturbance: Secondary | ICD-10-CM

## 2016-09-24 DIAGNOSIS — R351 Nocturia: Secondary | ICD-10-CM | POA: Diagnosis not present

## 2016-09-24 DIAGNOSIS — N19 Unspecified kidney failure: Secondary | ICD-10-CM

## 2016-09-24 DIAGNOSIS — C672 Malignant neoplasm of lateral wall of bladder: Secondary | ICD-10-CM | POA: Diagnosis not present

## 2016-09-24 DIAGNOSIS — N401 Enlarged prostate with lower urinary tract symptoms: Secondary | ICD-10-CM | POA: Diagnosis not present

## 2016-09-24 DIAGNOSIS — I1310 Hypertensive heart and chronic kidney disease without heart failure, with stage 1 through stage 4 chronic kidney disease, or unspecified chronic kidney disease: Secondary | ICD-10-CM | POA: Diagnosis not present

## 2016-09-24 DIAGNOSIS — I480 Paroxysmal atrial fibrillation: Secondary | ICD-10-CM | POA: Diagnosis not present

## 2016-09-24 DIAGNOSIS — I481 Persistent atrial fibrillation: Secondary | ICD-10-CM | POA: Diagnosis not present

## 2016-09-24 DIAGNOSIS — I4819 Other persistent atrial fibrillation: Secondary | ICD-10-CM

## 2016-09-24 DIAGNOSIS — M170 Bilateral primary osteoarthritis of knee: Secondary | ICD-10-CM

## 2016-09-24 MED ORDER — DONEPEZIL HCL 10 MG PO TABS: 10.0000 mg | ORAL_TABLET | Freq: Every day | ORAL | 2 refills | Status: AC

## 2016-09-24 MED ORDER — DULOXETINE HCL 30 MG PO CPEP: 30.0000 mg | ORAL_CAPSULE | Freq: Every day | ORAL | 2 refills | Status: AC

## 2016-09-24 NOTE — Addendum Note (Signed)
Addended by: Claretta Fraise on: 09/24/2016 11:59 AM   Modules accepted: Orders

## 2016-09-25 ENCOUNTER — Ambulatory Visit: Payer: Self-pay

## 2016-09-25 ENCOUNTER — Telehealth: Payer: Self-pay | Admitting: Pharmacist

## 2016-09-25 NOTE — Telephone Encounter (Signed)
[name] is scheduled for transurethral resection of bladder cancer on 10/25/2016.  He will need to stop warfarin 5 days prior to procedure.  Bridging not required.  Patient will take last warfarin dose on 10/19/2016.  He will hold 10/20/2016 until after procedure.  Restart warfarin 5mg  - take 1 and 1/2 tablets for 2 days then go back to usual dose of 5mg  1 tablet daily. Patient has appointment to check INR 10/11/16 - will review this plan with him then.  Patient called and aware of appt for 10/11/16

## 2016-09-26 ENCOUNTER — Ambulatory Visit: Payer: Self-pay

## 2016-09-27 ENCOUNTER — Other Ambulatory Visit: Payer: Self-pay

## 2016-09-27 NOTE — Patient Outreach (Signed)
[facility]) Care Management  09/09/2016  [name] 1925/11/02 [id number]  Telephonic Screening   Referral Date:  08/08/2016 Source:  Dr. Claretta Fraise, [facility], contact:  Glean Salen [phone] Issue:  HF. Hospital admission x 7 (2017).  Really fast deconditioning; increased falls and decreased alertness.  [facility]:  [employer] [place]  Subjective: [name] [id number] [phone] (H) Outreach call to patient's daughter Patient reached and was able to confirm conversation with his Daughter, [name] [phone].   Patient gives verbal consent to complete calls with daughter and discuss PHI.  Patient states daughter manages all his care.  States his biggest complaint is the pain in his legs which he takes Tylenol for pain management.    Providers: Primary MD:  Dr. Claretta Fraise, [facility] - last appt 08/06/16 and 09/24/2016 Urologist:  ??? Cardiologist:  Next appt 09/18/2016  Psycho/Social: Patient lives alone but has memory problems.   Memory issues donepezil/Aricept 08/09/16 Daughter, [name] and patient's sister assist with healthcare management.  Falls:  Daughter states patient had a fall about  2 weeks ago 09/2016 and  Hurt his LEFT knee. Patient did follow-up with MD.   Advanced Directives:  None but interested.  DME: cane, walker, scales, eyeglasses/readers only, dentures (full).   Co-morbidities:  Chronic combined systolic and diastolic CHF,  COPD, CAD, Paroxysmal atrial fibrillation, Dilated cardiomyopathy, EF 40%, A-fib, HTN, Hypothyroidism, Chronic Coumadin therapy, Dyslipidemia, Hypertension Arthritis of left knee with pain and swelling with h/o intraarticular chondroma; steroid injection 08/06/16.  Prednisone 10//27/17 Benign prostatic hyperplasia with nocturia with frequent urination during the night about ever 1 hour - tamsulosin/Flomax 08/09/16 Memory issues -  donepezil/Aricept 08/09/16 NEW:  Cancer (Bladder) 09/2016 -  Surgery date 10/26/2015  Admissions 2 over past 6 months ([place]).  -08/27/16 - 08/28/16 - Acute epigastric pain -04/01/2016 - 04/03/2016  - Acute on chronic combined systolic and diastolic CHF -[id number]  03/28/2015 - [facility] ED visits 7  -09/06/16 - Code STEMI   -09/03/16 - ABD pain and diarrhea -09/02/16 - shortness of breath, chest pain   -08/27/16 - Chest pain / ABD pain  -07/03/16 weakness following starting two new medications for BPH -05/01/16 constant, mild, generalized body aches, worse in the knees and hips onset last night. -04/01/16 ED to Hospital admission   BP 141/93 09/17/2016 Weight 175 lb (79 kg) 09/17/2016 Height 68 in (173 cm) 09/17/2016 BMI 26.70 (Overweight, Pre-obe 09/17/2016  Lipid Panel completed 12/26/2015 HDL 25.000 12/26/2015 LDL 91.000 12/26/2015 Cholesterol, total 188.000 12/26/2015 Triglycerides 360.000 12/26/2015 A1C 6.400 01/30/2016 Glucose Random 127.000 09/17/2016  CHF Scales,. BP cuff (daughter will check to make sure).   Medications: Patient self manages his own medications.  Daughter has tried to get patient to use a medication box but patient refuses to use. Family picks up meds at pharmacy Pharmacy:  [facility] ([id number]) 08/01/2014 Pneumovax (PP 08/17/1995 Flu Vaccine 08/22/2016 tDAP Vaccine 03/26/2015  Encounter Medications:  Outpatient Encounter Prescriptions as of 09/27/2016  Medication Sig  . acetaminophen (TYLENOL) 500 MG tablet Take 1-2 tablets (500-1,000 mg total) by mouth 2 (two) times daily as needed. (Patient taking differently: Take 500-1,000 mg by mouth 2 (two) times daily as needed for mild pain. )  . amLODipine (NORVASC) 10 MG tablet TAKE 1 TABLET DAILY  . carvedilol (COREG) 12.5 MG tablet Take 0.5 tablets (6.25 mg total) by mouth 2 (two) times daily with a meal.  . digoxin (LANOXIN) 0.125 MG tablet  Take 1 tablet (125 mcg total) by mouth daily.  Marland Kitchen donepezil  (ARICEPT) 10 MG tablet Take 1 tablet (10 mg total) by mouth at bedtime. To help with memory and to help think more clearly  . DULoxetine (CYMBALTA) 30 MG capsule Take 1 capsule (30 mg total) by mouth daily. For arthritis pain in the knees  . fenofibrate micronized (LOFIBRA) 134 MG capsule TAKE (1) CAPSULE DAILY BEFORE BREAKFAST.  . furosemide (LASIX) 20 MG tablet TAKE (1) TABLET DAILY IN THE MORNING.  Marland Kitchen gabapentin (NEURONTIN) 600 MG tablet Take 1 tablet (600 mg total) by mouth 3 (three) times daily.  Marland Kitchen HYDROcodone-acetaminophen (NORCO/VICODIN) 5-325 MG tablet Take one every 6-8 hours for pain not helped by tylenol alone  . levothyroxine (SYNTHROID, LEVOTHROID) 125 MCG tablet TAKE 1 TABLET DAILY  . LORazepam (ATIVAN) 0.5 MG tablet Take 1 tablet (0.5 mg total) by mouth 2 (two) times daily as needed for anxiety.  Marland Kitchen NITROSTAT 0.4 MG SL tablet Place 1 tablet (0.4 mg total) under the tongue every 5 (five) minutesas needed for chest pain.  . polyethylene glycol powder (GLYCOLAX/MIRALAX) powder Take 17 g by mouth daily as needed. For constipation  . tamsulosin (FLOMAX) 0.4 MG CAPS capsule Take 2 capsules (0.8 mg total) by mouth daily after supper. For urine flow and prostate  . warfarin (COUMADIN) 5 MG tablet Take 5 mg once daily except on Monday and Friday take 7.5 mg.   No facility-administered encounter medications on file as of 09/27/2016.     Functional Status:  In your present state of health, do you have any difficulty performing the following activities: 09/20/2016 08/28/2016  Hearing? N Y  Vision? N N  Difficulty concentrating or making decisions? N Y  Walking or climbing stairs? N Y  Dressing or bathing? N N  Doing errands, shopping? N Y  Conservation officer, nature and eating ? N -  Using the Toilet? N -  In the past six months, have you accidently leaked urine? N -  Do you have problems with loss of bowel control? N -  Managing your Medications? N -  Managing your Finances? N -  Housekeeping or  managing your Housekeeping? N -  Some recent data might be hidden    Fall/Depression Screening: PHQ 2/9 Scores 09/24/2016 09/20/2016 09/11/2016 08/06/2016 07/30/2016 07/24/2016 07/17/2016  PHQ - 2 Score 0 0 0 0 0 0 0  PHQ- 9 Score - - - - - - 0   Preventives: Right ear - skin care needs:  dermatologist appt needed. To be scheduled by patient.    Assessment / Plan: Referral Date:  08/08/2016 Partial Screening 09/20/16 Telephonic RN CM services 09/20/16 Program:  CHF 09/20/16  Memory RN CM encouraged to discuss memory issues with Primary MD  HF RN CM advised in importance of daily weights  LTC Plan: RN CM advised daughter that RN CM would like to have future discussion on LTC Plan (in-home and facility) should patient need more assistance due to memory changes.   Daughter agreed.   [facility] Pharmacist Referral 09/27/2016 Please assess if patient has been getting refills on time.   [facility] Educational Materials (mailed 09/27/16) -Heart Zone Magnet  -Heart Failure:  Keeping Track Of Your Weight Each Day -Heart Failure:  How To Be Salt Smart -Heart Failure:  Understanding Water Pills And Thirst -Heart Failure:  When To Call Your Doctor Or 911  Advance Directives:  RN CM will continue to follow and assist as needed.  -Advance Directive mailed 09/27/2016  RN CM advised in next contact call within 30 days.  RN CM provided contact information.  RN CM encouraged to notify MD of any changes in condition and / or 911 for emergency needs.   RN CM mailed Successful Outreach Letter to patient. [facility] notified case opened.   Nathaneil Canary, BSN, RN, [place] Care Management Care Management Coordinator [phone] Direct [phone] Cell [phone] Office [phone] Fax [email or web address]

## 2016-10-03 NOTE — Progress Notes (Deleted)
HPI The patient presents for one-year followup of his known coronary disease.   He was in the hospital in June with acute on chronic systolic and diastolic HF.  EF was 35 - 40%.  He was diuresied.  He was in the ED in late July with PVCs.  He followed up once in the office with Dr. Harl Bowie after that and then later with me.   He had a monitor placed with frequent PVCs.  Since I last saw him he had bladder surgery.  ***  He actually lives alone and says he's feeling well. He feeds to cats. He denies any cardiovascular symptoms. In particular he doesn't notice palpitations. He doesn't have presyncope or syncope. He has no chest pressure, neck or arm discomfort. When I looked at the emergency room reportedly was actually there because of some diffuse body aches and wasn't complaining of anything cardiac.  I reviewed the Holter and ED records.    Allergies  Allergen Reactions  . Amiodarone     Neuropathy  . Penicillins Itching    Has patient had a PCN reaction causing immediate rash, facial/tongue/throat swelling, SOB or lightheadedness with hypotension: Yesunknown Has patient had a PCN reaction causing severe rash involving mucus membranes or skin necrosis: unknown Has patient had a PCN reaction that required hospitalization Yesno  If all of the above answers are "NO", then may proceed with Ceph    Current Outpatient Prescriptions  Medication Sig Dispense Refill  . acetaminophen (TYLENOL) 500 MG tablet Take 1-2 tablets (500-1,000 mg total) by mouth 2 (two) times daily as needed. (Patient taking differently: Take 500-1,000 mg by mouth 2 (two) times daily as needed for mild pain. ) 30 tablet 0  . amLODipine (NORVASC) 10 MG tablet TAKE 1 TABLET DAILY 30 tablet 10  . carvedilol (COREG) 12.5 MG tablet Take 0.5 tablets (6.25 mg total) by mouth 2 (two) times daily with a meal. 30 tablet 0  . digoxin (LANOXIN) 0.125 MG tablet Take 1 tablet (125 mcg total) by mouth daily. 30 tablet 2  . donepezil  (ARICEPT) 10 MG tablet Take 1 tablet (10 mg total) by mouth at bedtime. To help with memory and to help think more clearly 30 tablet 2  . DULoxetine (CYMBALTA) 30 MG capsule Take 1 capsule (30 mg total) by mouth daily. For arthritis pain in the knees 30 capsule 2  . fenofibrate micronized (LOFIBRA) 134 MG capsule TAKE (1) CAPSULE DAILY BEFORE BREAKFAST. 90 capsule 0  . furosemide (LASIX) 20 MG tablet TAKE (1) TABLET DAILY IN THE MORNING. 30 tablet 5  . gabapentin (NEURONTIN) 600 MG tablet Take 1 tablet (600 mg total) by mouth 3 (three) times daily. 90 tablet 2  . HYDROcodone-acetaminophen (NORCO/VICODIN) 5-325 MG tablet Take one every 6-8 hours for pain not helped by tylenol alone 20 tablet 0  . levothyroxine (SYNTHROID, LEVOTHROID) 125 MCG tablet TAKE 1 TABLET DAILY 30 tablet 10  . LORazepam (ATIVAN) 0.5 MG tablet Take 1 tablet (0.5 mg total) by mouth 2 (two) times daily as needed for anxiety. 30 tablet 1  . NITROSTAT 0.4 MG SL tablet Place 1 tablet (0.4 mg total) under the tongue every 5 (five) minutesas needed for chest pain. 25 tablet 0  . polyethylene glycol powder (GLYCOLAX/MIRALAX) powder Take 17 g by mouth daily as needed. For constipation 3350 g 5  . tamsulosin (FLOMAX) 0.4 MG CAPS capsule Take 2 capsules (0.8 mg total) by mouth daily after supper. For urine flow and prostate 60 capsule  3  . warfarin (COUMADIN) 5 MG tablet Take 5 mg once daily except on Monday and Friday take 7.5 mg. 32 tablet 2   No current facility-administered medications for this visit.     Past Medical History:  Diagnosis Date  . Atrial fibrillation (HCC)    Persistent, treated with amiodarone and cardioversion  . CAD (coronary artery disease)    Last catheterization in August 2008 demonstrated the LAD 40% stenosis  . Cholecystitis   . COPD (chronic obstructive pulmonary disease) ([place])   . Dilated cardiomyopathy ([place])    Felt to be secondary to atrial fibrillation. EF is about 40%  . Drug therapy    Chronic  Coumadin therapy  . Dyslipidemia   . Hypertension   . Hypothyroidism    Possibly related to amiodarone    Past Surgical History:  Procedure Laterality Date  . CARDIAC CATHETERIZATION  05/2007   LAD 40% stenosis. Stent in the LAD had 20% in-stent restenosis. Followed by 40-50% distal stenosis. 50% diagonal stenosis. 40-50% circumflex stenosis. 70% OM1 stenosis and 30% ostial RCA stenosis. 30% proximal RCA stenosis.  . CORONARY STENT PLACEMENT    . HIP SURGERY Left    82yo    ROS:  Neuropathy .  Otherwise as stated in the HPI and negative for all other systems.  PHYSICAL EXAM There were no vitals taken for this visit. GENERAL:  Well appearing but does appear his stated age.  NECK:  No jugular venous distention, waveform within normal limits, carotid upstroke brisk and symmetric, soft right bruits, no thyromegaly LUNGS:  Clear to auscultation bilaterally CHEST:  Unremarkable HEART:  PMI not displaced or sustained,S1 and S2 within normal limits, no S3, no S4, no clicks, no rubs, apical early peaking systolic murmur radiating out the aortic outflow tract slightly ABD:  Flat, positive bowel sounds normal in frequency in pitch, no bruits, no rebound, no guarding, no midline pulsatile mass, no hepatomegaly, no splenomegaly EXT:  2 plus pulses upper and decreased DP/PT bilateral with right greater than left edema, no cyanosis no clubbing SKIN:  No rashes no nodules, multiple bruises.    EKG:  Sinus bradycardia, *** left bundle branch, rate premature ventricular contractions in a bigeminal pattern   ASSESSMENT AND PLAN  ATRIAL FIBRILLATION:  Has been off of amiodarone because it might cause neuropathy. I added this is an intolerance. He's not had any new paroxysms. He tolerates anticoagulation. No change in therapy is indicated.  Mr. [name] has a CHA2DS2 - VASc score of 4 with a risk of stroke of 4%.  CAD:   He has had no new symptoms since the [facility]. No further testing is  indicated.   HTN:  His blood pressure is at target. No change in therapy is planned.  HYPERLIPIDEMIA:   His last LDL was 75. He did have hypertriglyceridemia. This is followed by Claretta Fraise, MD  CAROTID STENOSIS:  Carotid Doppler demonstrated stable 40-59% bilateral stenosis.  I will arrange follow-up.  LEG SWELLING:  He should not have a DVT with his warfarin but I will check a venous Doppler.  PVCs: ***  I reviewed the hospital in Holter reports. He does have bigeminy and his pulse was recorded at 30 which was under counseling the PVCs. He reports he is not having any symptoms related to this.  He did have brief asymptomatic run of NSVT. However, he had no symptomatic sinus bradycardia. Therefore, at this point I think that no further cardiovascular testing is suggested.  CARDIOMYOPATHY:  The patient does have a slightly reduced ejection fraction. However, he is asymptomatic and seems to be euvolemic.  This is not significantly changed from previous. He hasn't tolerated ACE or ARB in the past because of renal insufficiency. No change in therapy is planned.

## 2016-10-04 ENCOUNTER — Ambulatory Visit (INDEPENDENT_AMBULATORY_CARE_PROVIDER_SITE_OTHER): Payer: Medicare Other | Admitting: Family Medicine

## 2016-10-04 ENCOUNTER — Telehealth: Payer: Self-pay | Admitting: Cardiology

## 2016-10-04 ENCOUNTER — Emergency Department (HOSPITAL_COMMUNITY): Payer: Medicare Other

## 2016-10-04 ENCOUNTER — Ambulatory Visit (INDEPENDENT_AMBULATORY_CARE_PROVIDER_SITE_OTHER): Payer: Medicare Other

## 2016-10-04 ENCOUNTER — Ambulatory Visit: Payer: Medicare Other | Admitting: Cardiology

## 2016-10-04 ENCOUNTER — Encounter (HOSPITAL_COMMUNITY): Payer: Self-pay | Admitting: Emergency Medicine

## 2016-10-04 ENCOUNTER — Ambulatory Visit: Payer: Medicare Other

## 2016-10-04 ENCOUNTER — Encounter: Payer: Self-pay | Admitting: Family Medicine

## 2016-10-04 ENCOUNTER — Inpatient Hospital Stay (HOSPITAL_COMMUNITY)
Admission: EM | Admit: 2016-10-04 | Discharge: 2016-10-14 | DRG: 947 | Disposition: E | Payer: Medicare Other | Attending: Internal Medicine | Admitting: Internal Medicine

## 2016-10-04 VITALS — BP 101/62 | HR 90 | Temp 96.8°F | Ht 68.0 in | Wt 171.0 lb

## 2016-10-04 DIAGNOSIS — Z823 Family history of stroke: Secondary | ICD-10-CM | POA: Diagnosis not present

## 2016-10-04 DIAGNOSIS — J449 Chronic obstructive pulmonary disease, unspecified: Secondary | ICD-10-CM | POA: Diagnosis not present

## 2016-10-04 DIAGNOSIS — E039 Hypothyroidism, unspecified: Secondary | ICD-10-CM | POA: Diagnosis present

## 2016-10-04 DIAGNOSIS — I420 Dilated cardiomyopathy: Secondary | ICD-10-CM | POA: Diagnosis present

## 2016-10-04 DIAGNOSIS — I480 Paroxysmal atrial fibrillation: Secondary | ICD-10-CM | POA: Diagnosis not present

## 2016-10-04 DIAGNOSIS — Z8249 Family history of ischemic heart disease and other diseases of the circulatory system: Secondary | ICD-10-CM

## 2016-10-04 DIAGNOSIS — R1084 Generalized abdominal pain: Secondary | ICD-10-CM | POA: Diagnosis not present

## 2016-10-04 DIAGNOSIS — G629 Polyneuropathy, unspecified: Secondary | ICD-10-CM | POA: Diagnosis not present

## 2016-10-04 DIAGNOSIS — K8020 Calculus of gallbladder without cholecystitis without obstruction: Secondary | ICD-10-CM | POA: Diagnosis present

## 2016-10-04 DIAGNOSIS — I1310 Hypertensive heart and chronic kidney disease without heart failure, with stage 1 through stage 4 chronic kidney disease, or unspecified chronic kidney disease: Secondary | ICD-10-CM | POA: Diagnosis present

## 2016-10-04 DIAGNOSIS — Z66 Do not resuscitate: Secondary | ICD-10-CM | POA: Diagnosis not present

## 2016-10-04 DIAGNOSIS — D689 Coagulation defect, unspecified: Secondary | ICD-10-CM | POA: Diagnosis present

## 2016-10-04 DIAGNOSIS — F0390 Unspecified dementia without behavioral disturbance: Secondary | ICD-10-CM | POA: Diagnosis present

## 2016-10-04 DIAGNOSIS — Z833 Family history of diabetes mellitus: Secondary | ICD-10-CM | POA: Diagnosis not present

## 2016-10-04 DIAGNOSIS — R29898 Other symptoms and signs involving the musculoskeletal system: Secondary | ICD-10-CM | POA: Diagnosis not present

## 2016-10-04 DIAGNOSIS — J690 Pneumonitis due to inhalation of food and vomit: Secondary | ICD-10-CM | POA: Diagnosis not present

## 2016-10-04 DIAGNOSIS — N179 Acute kidney failure, unspecified: Secondary | ICD-10-CM | POA: Diagnosis not present

## 2016-10-04 DIAGNOSIS — I5043 Acute on chronic combined systolic (congestive) and diastolic (congestive) heart failure: Secondary | ICD-10-CM | POA: Diagnosis not present

## 2016-10-04 DIAGNOSIS — A419 Sepsis, unspecified organism: Secondary | ICD-10-CM | POA: Diagnosis not present

## 2016-10-04 DIAGNOSIS — R791 Abnormal coagulation profile: Principal | ICD-10-CM

## 2016-10-04 DIAGNOSIS — Z955 Presence of coronary angioplasty implant and graft: Secondary | ICD-10-CM

## 2016-10-04 DIAGNOSIS — R262 Difficulty in walking, not elsewhere classified: Secondary | ICD-10-CM

## 2016-10-04 DIAGNOSIS — I481 Persistent atrial fibrillation: Secondary | ICD-10-CM | POA: Diagnosis present

## 2016-10-04 DIAGNOSIS — E876 Hypokalemia: Secondary | ICD-10-CM | POA: Diagnosis not present

## 2016-10-04 DIAGNOSIS — F0150 Vascular dementia without behavioral disturbance: Secondary | ICD-10-CM | POA: Diagnosis not present

## 2016-10-04 DIAGNOSIS — K8010 Calculus of gallbladder with chronic cholecystitis without obstruction: Secondary | ICD-10-CM | POA: Diagnosis present

## 2016-10-04 DIAGNOSIS — T45515A Adverse effect of anticoagulants, initial encounter: Secondary | ICD-10-CM | POA: Diagnosis present

## 2016-10-04 DIAGNOSIS — Z8551 Personal history of malignant neoplasm of bladder: Secondary | ICD-10-CM

## 2016-10-04 DIAGNOSIS — N19 Unspecified kidney failure: Secondary | ICD-10-CM

## 2016-10-04 DIAGNOSIS — I2510 Atherosclerotic heart disease of native coronary artery without angina pectoris: Secondary | ICD-10-CM | POA: Diagnosis present

## 2016-10-04 DIAGNOSIS — N189 Chronic kidney disease, unspecified: Secondary | ICD-10-CM | POA: Diagnosis present

## 2016-10-04 DIAGNOSIS — E785 Hyperlipidemia, unspecified: Secondary | ICD-10-CM | POA: Diagnosis not present

## 2016-10-04 DIAGNOSIS — R0902 Hypoxemia: Secondary | ICD-10-CM

## 2016-10-04 DIAGNOSIS — R110 Nausea: Secondary | ICD-10-CM | POA: Diagnosis not present

## 2016-10-04 DIAGNOSIS — R109 Unspecified abdominal pain: Secondary | ICD-10-CM

## 2016-10-04 DIAGNOSIS — R100 Acute abdomen: Secondary | ICD-10-CM | POA: Diagnosis not present

## 2016-10-04 DIAGNOSIS — J9601 Acute respiratory failure with hypoxia: Secondary | ICD-10-CM | POA: Diagnosis not present

## 2016-10-04 DIAGNOSIS — N400 Enlarged prostate without lower urinary tract symptoms: Secondary | ICD-10-CM | POA: Diagnosis present

## 2016-10-04 DIAGNOSIS — F329 Major depressive disorder, single episode, unspecified: Secondary | ICD-10-CM | POA: Diagnosis present

## 2016-10-04 DIAGNOSIS — G9340 Encephalopathy, unspecified: Secondary | ICD-10-CM

## 2016-10-04 DIAGNOSIS — I130 Hypertensive heart and chronic kidney disease with heart failure and stage 1 through stage 4 chronic kidney disease, or unspecified chronic kidney disease: Secondary | ICD-10-CM | POA: Diagnosis present

## 2016-10-04 DIAGNOSIS — Z7901 Long term (current) use of anticoagulants: Secondary | ICD-10-CM | POA: Diagnosis not present

## 2016-10-04 DIAGNOSIS — R0602 Shortness of breath: Secondary | ICD-10-CM

## 2016-10-04 DIAGNOSIS — F419 Anxiety disorder, unspecified: Secondary | ICD-10-CM | POA: Diagnosis present

## 2016-10-04 DIAGNOSIS — K2970 Gastritis, unspecified, without bleeding: Secondary | ICD-10-CM | POA: Diagnosis not present

## 2016-10-04 DIAGNOSIS — R10817 Generalized abdominal tenderness: Secondary | ICD-10-CM | POA: Diagnosis not present

## 2016-10-04 LAB — URINALYSIS, COMPLETE
GLUCOSE, UA: NEGATIVE
LEUKOCYTES UA: NEGATIVE
Nitrite, UA: NEGATIVE
SPEC GRAV UA: 1.02 (ref 1.005–1.030)
Urobilinogen, Ur: 1 mg/dL (ref 0.2–1.0)
pH, UA: 5.5 (ref 5.0–7.5)

## 2016-10-04 LAB — COMPREHENSIVE METABOLIC PANEL
ALK PHOS: 67 U/L (ref 38–126)
ALT: 14 U/L — ABNORMAL LOW (ref 17–63)
ANION GAP: 11 (ref 5–15)
AST: 22 U/L (ref 15–41)
Albumin: 4.2 g/dL (ref 3.5–5.0)
BUN: 10 mg/dL (ref 6–20)
CALCIUM: 9.4 mg/dL (ref 8.9–10.3)
CO2: 25 mmol/L (ref 22–32)
CREATININE: 1.27 mg/dL — AB (ref 0.61–1.24)
Chloride: 100 mmol/L — ABNORMAL LOW (ref 101–111)
GFR, EST AFRICAN AMERICAN: 56 mL/min — AB (ref >60)
GFR, EST NON AFRICAN AMERICAN: 49 mL/min — AB (ref >60)
Glucose, Bld: 140 mg/dL — ABNORMAL HIGH (ref 65–99)
Potassium: 3.4 mmol/L — ABNORMAL LOW (ref 3.5–5.1)
SODIUM: 136 mmol/L (ref 135–145)
Total Bilirubin: 0.7 mg/dL (ref 0.3–1.2)
Total Protein: 7.7 g/dL (ref 6.5–8.1)

## 2016-10-04 LAB — URINALYSIS, ROUTINE W REFLEX MICROSCOPIC
BACTERIA UA: NONE SEEN
Bilirubin Urine: NEGATIVE
Glucose, UA: NEGATIVE mg/dL
Ketones, ur: NEGATIVE mg/dL
LEUKOCYTES UA: NEGATIVE
NITRITE: NEGATIVE
PH: 5 (ref 5.0–8.0)
PROTEIN: NEGATIVE mg/dL
SPECIFIC GRAVITY, URINE: 1.013 (ref 1.005–1.030)

## 2016-10-04 LAB — MICROSCOPIC EXAMINATION
BACTERIA UA: NONE SEEN
WBC, UA: NONE SEEN /hpf (ref 0–?)

## 2016-10-04 LAB — CBC WITH DIFFERENTIAL/PLATELET
BASOS ABS: 0 10*3/uL (ref 0.0–0.1)
BASOS PCT: 0 %
EOS PCT: 3 %
Eosinophils Absolute: 0.2 10*3/uL (ref 0.0–0.7)
HCT: 47 % (ref 39.0–52.0)
Hemoglobin: 15.4 g/dL (ref 13.0–17.0)
Lymphocytes Relative: 18 %
Lymphs Abs: 1.1 10*3/uL (ref 0.7–4.0)
MCH: 26.1 pg (ref 26.0–34.0)
MCHC: 32.8 g/dL (ref 30.0–36.0)
MCV: 79.7 fL (ref 78.0–100.0)
MONO ABS: 0.5 10*3/uL (ref 0.1–1.0)
MONOS PCT: 8 %
Neutro Abs: 4.3 10*3/uL (ref 1.7–7.7)
Neutrophils Relative %: 71 %
PLATELETS: 301 10*3/uL (ref 150–400)
RBC: 5.9 MIL/uL — ABNORMAL HIGH (ref 4.22–5.81)
RDW: 15 % (ref 11.5–15.5)
WBC: 6.2 10*3/uL (ref 4.0–10.5)

## 2016-10-04 LAB — DIGOXIN LEVEL: Digoxin Level: 0.8 ng/mL (ref 0.8–2.0)

## 2016-10-04 LAB — PROTIME-INR: Prothrombin Time: 88.9 seconds — ABNORMAL HIGH (ref 11.4–15.2)

## 2016-10-04 LAB — LIPASE, BLOOD: LIPASE: 19 U/L (ref 11–51)

## 2016-10-04 LAB — TROPONIN I: Troponin I: <0.03 ng/mL (ref <0.03)

## 2016-10-04 LAB — LACTIC ACID, PLASMA: Lactic Acid, Venous: 1 mmol/L (ref 0.5–1.9)

## 2016-10-04 MED ORDER — IOPAMIDOL (ISOVUE-300) INJECTION 61%
100.0000 mL | Freq: Once | INTRAVENOUS | Status: AC | PRN
  Administered 2016-10-04: 100 mL via INTRAVENOUS

## 2016-10-04 MED ORDER — IOPAMIDOL (ISOVUE-300) INJECTION 61%
INTRAVENOUS | Status: AC
  Administered 2016-10-04: 30 mL via ORAL
  Filled 2016-10-04: qty 30

## 2016-10-04 MED ORDER — ACETAMINOPHEN 650 MG RE SUPP: 650.0000 mg | Freq: Four times a day (QID) | RECTAL | Status: DC | PRN

## 2016-10-04 MED ORDER — FUROSEMIDE 20 MG PO TABS
20.0000 mg | ORAL_TABLET | Freq: Every day | ORAL | Status: DC
  Administered 2016-10-05 – 2016-10-08 (×4): 20 mg via ORAL
  Filled 2016-10-04 (×4): qty 1

## 2016-10-04 MED ORDER — DULOXETINE HCL 30 MG PO CPEP
30.0000 mg | ORAL_CAPSULE | Freq: Every day | ORAL | Status: DC
  Administered 2016-10-05 – 2016-10-11 (×6): 30 mg via ORAL
  Filled 2016-10-04 (×6): qty 1

## 2016-10-04 MED ORDER — DIGOXIN 125 MCG PO TABS
125.0000 ug | ORAL_TABLET | Freq: Every day | ORAL | Status: DC
  Administered 2016-10-05 – 2016-10-11 (×6): 125 ug via ORAL
  Filled 2016-10-04 (×7): qty 1

## 2016-10-04 MED ORDER — CARVEDILOL 3.125 MG PO TABS
6.2500 mg | ORAL_TABLET | Freq: Two times a day (BID) | ORAL | Status: DC
  Administered 2016-10-05 – 2016-10-11 (×13): 6.25 mg via ORAL
  Filled 2016-10-04 (×14): qty 2

## 2016-10-04 MED ORDER — AMLODIPINE BESYLATE 5 MG PO TABS
10.0000 mg | ORAL_TABLET | Freq: Every day | ORAL | Status: DC
  Filled 2016-10-04: qty 2

## 2016-10-04 MED ORDER — ACETAMINOPHEN 325 MG PO TABS
650.0000 mg | ORAL_TABLET | Freq: Four times a day (QID) | ORAL | Status: DC | PRN
  Administered 2016-10-05 – 2016-10-07 (×3): 650 mg via ORAL
  Filled 2016-10-04 (×3): qty 2

## 2016-10-04 MED ORDER — MORPHINE SULFATE (PF) 4 MG/ML IV SOLN
4.0000 mg | INTRAVENOUS | Status: DC | PRN
  Administered 2016-10-04: 4 mg via INTRAVENOUS
  Filled 2016-10-04: qty 1

## 2016-10-04 MED ORDER — HYDROMORPHONE HCL 1 MG/ML IJ SOLN: 0.5000 mg | INTRAMUSCULAR | Status: AC | PRN

## 2016-10-04 MED ORDER — DONEPEZIL HCL 5 MG PO TABS
10.0000 mg | ORAL_TABLET | Freq: Every day | ORAL | Status: DC
  Administered 2016-10-05 – 2016-10-10 (×7): 10 mg via ORAL
  Filled 2016-10-04 (×7): qty 2

## 2016-10-04 MED ORDER — FENOFIBRATE 160 MG PO TABS
160.0000 mg | ORAL_TABLET | Freq: Every day | ORAL | Status: DC
  Administered 2016-10-05 – 2016-10-07 (×3): 160 mg via ORAL
  Filled 2016-10-04 (×3): qty 1

## 2016-10-04 MED ORDER — ONDANSETRON HCL 4 MG/2ML IJ SOLN
4.0000 mg | INTRAMUSCULAR | Status: DC | PRN
  Administered 2016-10-04: 4 mg via INTRAVENOUS
  Filled 2016-10-04: qty 2

## 2016-10-04 MED ORDER — POTASSIUM CHLORIDE CRYS ER 20 MEQ PO TBCR
40.0000 meq | EXTENDED_RELEASE_TABLET | Freq: Once | ORAL | Status: AC
  Administered 2016-10-05: 40 meq via ORAL
  Filled 2016-10-04: qty 2

## 2016-10-04 MED ORDER — SODIUM CHLORIDE 0.9 % IV SOLN
INTRAVENOUS | Status: DC
  Administered 2016-10-04 – 2016-10-08 (×8): via INTRAVENOUS

## 2016-10-04 MED ORDER — CITALOPRAM HYDROBROMIDE 20 MG PO TABS
20.0000 mg | ORAL_TABLET | Freq: Every day | ORAL | Status: DC
  Administered 2016-10-05 – 2016-10-11 (×6): 20 mg via ORAL
  Filled 2016-10-04 (×7): qty 1

## 2016-10-04 MED ORDER — PHYTONADIONE 5 MG PO TABS
5.0000 mg | ORAL_TABLET | Freq: Once | ORAL | Status: AC
  Administered 2016-10-04: 5 mg via ORAL
  Filled 2016-10-04: qty 1

## 2016-10-04 MED ORDER — DICYCLOMINE HCL 10 MG PO CAPS
10.0000 mg | ORAL_CAPSULE | Freq: Three times a day (TID) | ORAL | Status: DC
  Administered 2016-10-05 – 2016-10-11 (×25): 10 mg via ORAL
  Filled 2016-10-04 (×38): qty 1

## 2016-10-04 MED ORDER — TAMSULOSIN HCL 0.4 MG PO CAPS
0.8000 mg | ORAL_CAPSULE | Freq: Every day | ORAL | Status: DC
  Administered 2016-10-05 – 2016-10-11 (×8): 0.8 mg via ORAL
  Filled 2016-10-04 (×8): qty 2

## 2016-10-04 MED ORDER — GABAPENTIN 300 MG PO CAPS
600.0000 mg | ORAL_CAPSULE | Freq: Three times a day (TID) | ORAL | Status: DC
  Administered 2016-10-05 – 2016-10-07 (×8): 600 mg via ORAL
  Filled 2016-10-04 (×8): qty 2

## 2016-10-04 MED ORDER — LEVOTHYROXINE SODIUM 25 MCG PO TABS
125.0000 ug | ORAL_TABLET | Freq: Every day | ORAL | Status: DC
  Administered 2016-10-05 – 2016-10-11 (×6): 125 ug via ORAL
  Filled 2016-10-04 (×7): qty 1

## 2016-10-04 MED ORDER — LORAZEPAM 0.5 MG PO TABS
0.5000 mg | ORAL_TABLET | Freq: Two times a day (BID) | ORAL | Status: DC | PRN
  Administered 2016-10-08 (×2): 0.5 mg via ORAL
  Filled 2016-10-04 (×2): qty 1

## 2016-10-04 NOTE — ED Notes (Signed)
CRITICAL VALUE ALERT  Critical value received:  INR >10  Date of notification:  10/01/2016  Time of notification:  1915  Critical value read back:Yes.    Nurse who received alert:  RMinter, RN  MD notified (1st page):  Dr. Thurnell Garbe  Time of first page:  1916  MD notified (2nd page):  Time of second page:  Responding MD:  Dr. Thurnell Garbe  Time MD responded:  Dr. Thurnell Garbe

## 2016-10-04 NOTE — ED Provider Notes (Signed)
[facility] DEPT Provider Note   CSN: [id number] Arrival date & time: 10/01/2016  1712     History   Chief Complaint Chief Complaint  Patient presents with  . Abdominal Pain    HPI [name] is a 82 y.o. male.  HPI  Pt was seen at 1735.  Per pt, c/o gradual onset and persistence of constant generalized abd "pain" for the past 1 week. Describes the abd pain as "aching."  Has been associated with generalized body "aches" and generalized weakness for the past 2 days. Denies N/V, no diarrhea, no fevers, no back pain, no rash, no CP/SOB, no black or blood in stools, no dysuria/hematuria.   Past Medical History:  Diagnosis Date  . Atrial fibrillation (HCC)    Persistent, treated with amiodarone and cardioversion  . CAD (coronary artery disease)    Last catheterization in August 2008 demonstrated the LAD 40% stenosis  . Cholecystitis   . COPD (chronic obstructive pulmonary disease) ([place])   . Dilated cardiomyopathy ([place])    Felt to be secondary to atrial fibrillation. EF is about 40%  . Drug therapy    Chronic Coumadin therapy  . Dyslipidemia   . Hypertension   . Hypothyroidism    Possibly related to amiodarone    Patient Active Problem List   Diagnosis Date Noted  . Malignant neoplasm of lateral wall of urinary bladder ([place]) 09/23/2016  . Acute epigastric pain 08/27/2016  . Dementia 08/27/2016  . Cholelithiasis 08/27/2016  . Bladder wall thickening 08/27/2016  . Calculus of gallbladder with chronic cholecystitis without obstruction   . Arthritis of both knees 07/30/2016  . Chronic systolic congestive heart failure ([place]) 04/01/2016  . Insomnia 03/06/2016  . Dyslipidemia 12/26/2015  . Fecal soiling due to fecal incontinence 09/12/2015  . Benign prostatic hyperplasia with nocturia 08/02/2015  . Arthritis, hip 08/02/2015  . At high risk for falls 07/14/2015  . Hereditary and idiopathic peripheral neuropathy 03/06/2015  . Pre-diabetes 12/23/2014  . Hypothyroid  11/18/2013  . Long term current use of anticoagulant therapy 10/05/2013  . Paresthesias 10/05/2013  . Paroxysmal atrial fibrillation ([place]) 01/25/2013  . Bruit 11/20/2011  . Coronary atherosclerosis 12/12/2010  . Hypertensive heart and renal disease with renal failure 01/05/2009  . CARDIOMYOPATHY 01/05/2009    Past Surgical History:  Procedure Laterality Date  . CARDIAC CATHETERIZATION  05/2007   LAD 40% stenosis. Stent in the LAD had 20% in-stent restenosis. Followed by 40-50% distal stenosis. 50% diagonal stenosis. 40-50% circumflex stenosis. 70% OM1 stenosis and 30% ostial RCA stenosis. 30% proximal RCA stenosis.  . CORONARY STENT PLACEMENT    . HIP SURGERY Left    82yo       Home Medications    Prior to Admission medications   Medication Sig Start Date End Date Taking? Authorizing Provider  acetaminophen (TYLENOL) 500 MG tablet Take 1-2 tablets (500-1,000 mg total) by mouth 2 (two) times daily as needed. Patient taking differently: Take 500-1,000 mg by mouth 2 (two) times daily as needed for mild pain.  03/28/16   Cherre Robins, PharmD  amLODipine (NORVASC) 10 MG tablet TAKE 1 TABLET DAILY 07/12/16   Minus Breeding, MD  carvedilol (COREG) 12.5 MG tablet Take 0.5 tablets (6.25 mg total) by mouth 2 (two) times daily with a meal. 09/09/16   Claretta Fraise, MD  digoxin (LANOXIN) 0.125 MG tablet Take 1 tablet (125 mcg total) by mouth daily. 09/09/16   Claretta Fraise, MD  donepezil (ARICEPT) 10 MG tablet Take 1 tablet (10 mg total)  by mouth at bedtime. To help with memory and to help think more clearly 09/24/16   Claretta Fraise, MD  DULoxetine (CYMBALTA) 30 MG capsule Take 1 capsule (30 mg total) by mouth daily. For arthritis pain in the knees 09/24/16   Claretta Fraise, MD  fenofibrate micronized (LOFIBRA) 134 MG capsule TAKE (1) CAPSULE DAILY BEFORE BREAKFAST. 07/18/16   Claretta Fraise, MD  furosemide (LASIX) 20 MG tablet TAKE (1) TABLET DAILY IN THE MORNING. 08/22/16   Claretta Fraise, MD    gabapentin (NEURONTIN) 600 MG tablet Take 1 tablet (600 mg total) by mouth 3 (three) times daily. 09/10/16   Claretta Fraise, MD  HYDROcodone-acetaminophen (NORCO/VICODIN) 5-325 MG tablet Take one every 6-8 hours for pain not helped by tylenol alone 09/17/16   Milton Ferguson, MD  levothyroxine (SYNTHROID, LEVOTHROID) 125 MCG tablet TAKE 1 TABLET DAILY 07/24/16   Claretta Fraise, MD  LORazepam (ATIVAN) 0.5 MG tablet Take 1 tablet (0.5 mg total) by mouth 2 (two) times daily as needed for anxiety. 09/09/16   Claretta Fraise, MD  NITROSTAT 0.4 MG SL tablet Place 1 tablet (0.4 mg total) under the tongue every 5 (five) minutesas needed for chest pain. 11/14/15   Claretta Fraise, MD  polyethylene glycol powder (GLYCOLAX/MIRALAX) powder Take 17 g by mouth daily as needed. For constipation 07/24/16   Claretta Fraise, MD  tamsulosin (FLOMAX) 0.4 MG CAPS capsule Take 2 capsules (0.8 mg total) by mouth daily after supper. For urine flow and prostate 08/06/16   Claretta Fraise, MD  warfarin (COUMADIN) 5 MG tablet Take 5 mg once daily except on Monday and Friday take 7.5 mg. 07/30/16   Claretta Fraise, MD    Family History Family History  Problem Relation Age of Onset  . Adopted: Yes  . Heart attack Mother   . Heart attack Father   . Diabetes Other   . Stroke Other     Social History Social History  Substance Use Topics  . Smoking status: Never Smoker  . Smokeless tobacco: Never Used  . Alcohol use No     Comment: quit at age 53yo     Allergies   Amiodarone and Penicillins   Review of Systems Review of Systems ROS: Statement: All systems negative except as marked or noted in the HPI; Constitutional: Negative for fever and chills. +generalized body aches, generalized weakness.; ; Eyes: Negative for eye pain, redness and discharge. ; ; ENMT: Negative for ear pain, hoarseness, nasal congestion, sinus pressure and sore throat. ; ; Cardiovascular: Negative for chest pain, palpitations, diaphoresis, dyspnea and  peripheral edema. ; ; Respiratory: Negative for cough, wheezing and stridor. ; ; Gastrointestinal: +abd pain. Negative for nausea, vomiting, diarrhea, blood in stool, hematemesis, jaundice and rectal bleeding. . ; ; Genitourinary: Negative for dysuria, flank pain and hematuria. ; ; Musculoskeletal: Negative for back pain and neck pain. Negative for swelling and trauma.; ; Skin: Negative for pruritus, rash, abrasions, blisters, bruising and skin lesion.; ; Neuro: Negative for headache, lightheadedness and neck stiffness. Negative for altered level of consciousness, altered mental status, extremity weakness, paresthesias, involuntary movement, seizure and syncope.       Physical Exam Updated Vital Signs BP 155/94 (BP Location: Left Arm)   Pulse 89   Temp 98.1 F (36.7 C) (Oral)   Resp 20   Ht 5\' 6"  (1.676 m)   Wt 165 lb (74.8 kg)   SpO2 95%   BMI 26.63 kg/m   Physical Exam 1740: Physical examination:  Nursing notes reviewed; Vital  signs and O2 SAT reviewed;  Constitutional: Well developed, Well nourished, Well hydrated, Uncomfortable appearing.; Head:  Normocephalic, atraumatic; Eyes: EOMI, PERRL, No scleral icterus; ENMT: Mouth and pharynx normal, Mucous membranes moist; Neck: Supple, Full range of motion, No lymphadenopathy; Cardiovascular: Irregular rate and rhythm, No gallop; Respiratory: Breath sounds clear & equal bilaterally, No wheezes.  Speaking full sentences with ease, Normal respiratory effort/excursion; Chest: Nontender, Movement normal; Abdomen: Soft, +diffuse tenderness to palp. Nondistended, Hyperactive bowel sounds; Genitourinary: No CVA tenderness; Extremities: Pulses normal, No tenderness, No edema, No calf edema or asymmetry.; Neuro: AA&Ox3, Major CN grossly intact.  Speech clear. No gross focal motor deficits in extremities.; Skin: Color normal, Warm, Dry.   ED Treatments / Results  Labs (all labs ordered are listed, but only abnormal results are displayed)   EKG  EKG  Interpretation  Date/Time:  Friday October 04 2016 18:01:01 EST Ventricular Rate:  98 PR Interval:    QRS Duration: 147 QT Interval:  407 QTC Calculation: 520 R Axis:   -38 Text Interpretation:  Atrial fibrillation Premature ventricular complexes Left bundle branch block When compared with ECG of 09/17/2016 No significant change was found Confirmed by [facility]  MD, Nunzio Cory [phone]) on 09/29/2016 6:03:57 PM       Radiology   Procedures Procedures (including critical care time)  Medications Ordered in ED Medications  0.9 %  sodium chloride infusion (not administered)  morphine 4 MG/ML injection 4 mg (not administered)  ondansetron (ZOFRAN) injection 4 mg (not administered)     Initial Impression / Assessment and Plan / ED Course  I have reviewed the triage vital signs and the nursing notes.  Pertinent labs & imaging results that were available during my care of the patient were reviewed by me and considered in my medical decision making (see chart for details).  MDM Reviewed: nursing note, previous chart and vitals Reviewed previous: labs and x-ray Interpretation: labs and CT scan    Results for orders placed or performed during the hospital encounter of 10/09/2016  CBC with Differential  Result Value Ref Range   WBC 6.2 4.0 - 10.5 K/uL   RBC 5.90 (H) 4.22 - 5.81 MIL/uL   Hemoglobin 15.4 13.0 - 17.0 g/dL   HCT 47.0 39.0 - 52.0 %   MCV 79.7 78.0 - 100.0 fL   MCH 26.1 26.0 - 34.0 pg   MCHC 32.8 30.0 - 36.0 g/dL   RDW 15.0 11.5 - 15.5 %   Platelets 301 150 - 400 K/uL   Neutrophils Relative % 71 %   Neutro Abs 4.3 1.7 - 7.7 K/uL   Lymphocytes Relative 18 %   Lymphs Abs 1.1 0.7 - 4.0 K/uL   Monocytes Relative 8 %   Monocytes Absolute 0.5 0.1 - 1.0 K/uL   Eosinophils Relative 3 %   Eosinophils Absolute 0.2 0.0 - 0.7 K/uL   Basophils Relative 0 %   Basophils Absolute 0.0 0.0 - 0.1 K/uL  Lactic acid, plasma  Result Value Ref Range   Lactic Acid, Venous 1.0 0.5 - 1.9  mmol/L  Lipase, blood  Result Value Ref Range   Lipase 19 11 - 51 U/L  Comprehensive metabolic panel  Result Value Ref Range   Sodium 136 135 - 145 mmol/L   Potassium 3.4 (L) 3.5 - 5.1 mmol/L   Chloride 100 (L) 101 - 111 mmol/L   CO2 25 22 - 32 mmol/L   Glucose, Bld 140 (H) 65 - 99 mg/dL   BUN 10 6 - 20  mg/dL   Creatinine, Ser 1.27 (H) 0.61 - 1.24 mg/dL   Calcium 9.4 8.9 - 10.3 mg/dL   Total Protein 7.7 6.5 - 8.1 g/dL   Albumin 4.2 3.5 - 5.0 g/dL   AST 22 15 - 41 U/L   ALT 14 (L) 17 - 63 U/L   Alkaline Phosphatase 67 38 - 126 U/L   Total Bilirubin 0.7 0.3 - 1.2 mg/dL   GFR calc non Af Amer 49 (L) >60 mL/min   GFR calc Af Amer 56 (L) >60 mL/min   Anion gap 11 5 - 15  Urinalysis, Routine w reflex microscopic  Result Value Ref Range   Color, Urine YELLOW YELLOW   APPearance CLEAR CLEAR   Specific Gravity, Urine 1.013 1.005 - 1.030   pH 5.0 5.0 - 8.0   Glucose, UA NEGATIVE NEGATIVE mg/dL   Hgb urine dipstick SMALL (A) NEGATIVE   Bilirubin Urine NEGATIVE NEGATIVE   Ketones, ur NEGATIVE NEGATIVE mg/dL   Protein, ur NEGATIVE NEGATIVE mg/dL   Nitrite NEGATIVE NEGATIVE   Leukocytes, UA NEGATIVE NEGATIVE   RBC / HPF 0-5 0 - 5 RBC/hpf   WBC, UA 0-5 0 - 5 WBC/hpf   Bacteria, UA NONE SEEN NONE SEEN   Mucous PRESENT    Hyaline Casts, UA PRESENT    Granular Casts, UA PRESENT   Digoxin level  Result Value Ref Range   Digoxin Level 0.8 0.8 - 2.0 ng/mL  Protime-INR  Result Value Ref Range   Prothrombin Time 88.9 (H) 11.4 - 15.2 seconds   INR >10.00 (HH)   Troponin I  Result Value Ref Range   Troponin I <0.03 <0.03 ng/mL    Ct Abdomen Pelvis W Contrast Result Date: 10/11/2016 CLINICAL DATA:  Severe abdominal pain, history of bladder cancer EXAM: CT ABDOMEN AND PELVIS WITH CONTRAST TECHNIQUE: Multidetector CT imaging of the abdomen and pelvis was performed using the standard protocol following bolus administration of intravenous contrast. CONTRAST:  100 mL Isovue 300 IV  COMPARISON:  09/03/2016 FINDINGS: Lower chest: Mild subpleural reticulation at the lung bases. Hepatobiliary: The liver is within normal limits. Gallbladder is notable for a 14 mm layering gallstone (series 2/image 28). No associated inflammatory changes. Pancreas: Within normal limits. Spleen: Within normal limits. Adrenals/Urinary Tract: Adrenal glands are within normal limits. 1.5 cm lateral left upper pole renal cyst. Right kidney is within normal limits. No hydronephrosis. 1.5 x 3.4 cm irregular enhancing mass along the left anterior bladder wall (series 2/ image 81) with additional underlying irregular wall thickening (series 2/ image 79), corresponding to the patient's known primary bladder neoplasm. Stomach/Bowel: Stomach is within normal limits. No evidence of bowel obstruction. Normal appendix (series 2/ image 45). Left colonic diverticulosis, without evidence of diverticulitis. Vascular/Lymphatic: No evidence of abdominal aortic aneurysm. Atherosclerotic calcifications of the abdominal aorta and branch vessels. No suspicious abdominopelvic lymphadenopathy. Reproductive: Mild prostatomegaly. Other: No abdominopelvic ascites. Small fat containing right inguinal hernia (series 2/ image 83). Musculoskeletal: Degenerative changes of the visualized thoracolumbar spine. IMPRESSION: Irregular enhancing mass along the left anterior bladder wall, corresponding to patient's known primary bladder neoplasm. Cholelithiasis, without associated inflammatory changes. No evidence of bowel obstruction.  Normal appendix. No CT findings to account for the patient's abdominal pain. Additional stable ancillary findings as above. Electronically Signed   By: Julian Hy M.D.   On: 09/24/2016 18:55   Dg Abd Acute W/chest Result Date: 09/29/2016 CLINICAL DATA:  Patient states that he's had severe abdominal pain since last night, denies vomiting/diarrhea EXAM:  DG ABDOMEN ACUTE W/ 1V CHEST COMPARISON:  09/17/2016 previous  FINDINGS: Heart size normal.  Atheromatous aorta. Lungs are clear. No effusion. No free air. Normal bowel gas pattern. There are no abnormal calcifications. Facet disease in the lower lumbar spine. IMPRESSION: No acute cardiopulmonary disease. Negative abdominal radiographs. Electronically Signed   By: Lucrezia Europe M.D.   On: 09/15/2016 15:35    2020:  IV morphine given for pain with improvement. INR elevated, no active bleeding; PO Vit K ordered.  Dx and testing d/w pt and family.  Questions answered.  Verb understanding, agreeable to admit.  T/C to [facility]. Nehemiah Settle, case discussed, including:  HPI, pertinent PM/SHx, VS/PE, dx testing, ED course and treatment:  Agreeable to admit, requests to write temporary orders, obtain observation medical bed to team APAdmits.     Final Clinical Impressions(s) / ED Diagnoses   Final diagnoses:  None    New Prescriptions New Prescriptions   No medications on file     Francine Graven, DO 10/07/16 1705

## 2016-10-04 NOTE — Progress Notes (Signed)
Subjective:  Patient ID: Odysseus Boulware, male    DOB: [birth year]  Age: 82 y.o. MRN: [id number]  CC: Joint Pain   HPI Kirklan Bueno presents for "I hurt all over." Onset yesterday. Stayed in bed all day. Denies symptoms such as shortness of breath and chest pain. Pain is primarily in the abdomen. It is diffuse. Moderate severity. He is not taking fluids well. He has a poor appetite. Patient has recently been diagnosed with bladder cancer. Pain is not focused at suprapubic area. He is due for surgical repair in the near future. Patient expresses concern about undergoing surgery.   History Kelson has a past medical history of Atrial fibrillation ([place]); CAD (coronary artery disease); Cholecystitis; COPD (chronic obstructive pulmonary disease) ([place]); Dilated cardiomyopathy ([place]); Drug therapy; Dyslipidemia; Hypertension; and Hypothyroidism.   He has a past surgical history that includes Hip surgery (Left); Cardiac catheterization (05/2007); and Coronary stent placement.   His family history includes Diabetes in his other; Heart attack in his father and mother; Stroke in his other. He was adopted.He reports that he has never smoked. He has never used smokeless tobacco. He reports that he does not drink alcohol or use drugs.    ROS Review of Systems  Constitutional: Positive for appetite change (poor appetite). Negative for chills, diaphoresis and fever.  HENT: Negative for rhinorrhea and sore throat.   Respiratory: Negative for cough and shortness of breath.   Cardiovascular: Negative for chest pain.  Gastrointestinal: Positive for abdominal pain.  Musculoskeletal: Positive for arthralgias. Negative for myalgias.  Skin: Negative for rash.  Neurological: Positive for weakness. Negative for headaches.    Objective:  BP 101/62 (BP Location: Right Arm, Patient Position: Sitting, Cuff Size: Normal)   Pulse 90   Temp (!) 96.8 F (36 C) (Oral)   Ht 5\' 8"  (1.727 m)   Wt 171 lb (77.6 kg)   SpO2  96%   BMI 26.00 kg/m   BP Readings from Last 3 Encounters:  30-Oct-2016 (!) 67/29  09/26/2016 101/62  09/24/16 116/71    Wt Readings from Last 3 Encounters:  10/30/16 159 lb 6.3 oz (72.3 kg)  09/22/2016 171 lb (77.6 kg)  09/24/16 172 lb (78 kg)     Physical Exam  Constitutional: He is oriented to person, place, and time. No distress.  HENT:  Head: Normocephalic and atraumatic.  Cardiovascular: Normal rate and regular rhythm.   Pulmonary/Chest: Effort normal and breath sounds normal.  Abdominal: Bowel sounds are normal. He exhibits no distension. There is tenderness (mild, diffuse).  Neurological: He is alert and oriented to person, place, and time.  Skin: Skin is warm and dry. He is not diaphoretic.     Lab Results  Component Value Date   WBC 10.5 October 30, 2016   HGB 16.1 Oct 30, 2016   HCT 50.5 October 30, 2016   PLT 434 (H) 10/30/16   GLUCOSE 221 (H) 10/30/16   CHOL 188 12/26/2015   TRIG 360 (H) 12/26/2015   HDL 25 (L) 12/26/2015   LDLCALC 91 12/26/2015   ALT 11 (L) Oct 30, 2016   AST 17 2016/10/30   NA 137 2016/10/30   K 4.2 10-30-16   CL 95 (L) 10/30/2016   CREATININE 3.22 (H) 10-30-16   BUN 38 (H) 2016-10-30   CO2 28 10/30/2016   TSH 1.770 09/09/2016   INR 3.27 Oct 30, 2016   HGBA1C 6.1 07/14/2015    Dg Chest 2 View  Result Date: 09/17/2016 CLINICAL DATA:  Shortness of breath since last night EXAM: CHEST  2 VIEW  COMPARISON:  September 06, 2016 FINDINGS: The heart size and mediastinal contours are stable. The heart size is upper limits normal. There is no focal infiltrate, pulmonary edema, or pleural effusion. Mild central pulmonary vascular congestion is identified. Degenerative joint changes of the spine are noted. IMPRESSION: Mild central pulmonary vascular congestion without frank edema. Electronically Signed   By: Abelardo Diesel M.D.   On: 09/17/2016 12:49   Dg Knee Complete 4 Views Left  Result Date: 09/17/2016 CLINICAL DATA:  Generalized pain and swelling after  falling down steps yesterday. EXAM: LEFT KNEE - COMPLETE 4+ VIEW COMPARISON:  Multiple radiographic and MRI exam same year FINDINGS: There is a large knee joint effusion. No acute fracture. No way compartment joint space narrowing or patellofemoral osteoarthritis. Calcification in Hoffa's fat pad could be calcified fat necrosis or a chondroma as noted at MRI. IMPRESSION: No acute finding. Joint effusion which has been present on previous studies and felt secondary to synovitis. Benign calcification in Hoffa's fat pad that could be fat necrosis or a chondroma. Electronically Signed   By: Nelson Chimes M.D.   On: 09/17/2016 11:41   Dg Knee Complete 4 Views Right  Result Date: 09/17/2016 CLINICAL DATA:  [address] down stairs landing on the knee EXAM: RIGHT KNEE - COMPLETE 4+ VIEW COMPARISON:  03/04/2016 FINDINGS: Small knee joint effusion. No osteoarthritic joint space narrowing. No fracture. Regional vascular calcification incidentally noted. IMPRESSION: Small joint effusion.  Otherwise negative. Electronically Signed   By: Nelson Chimes M.D.   On: 09/17/2016 11:42    Assessment & Plan:   Noman was seen today for joint pain.  Diagnoses and all orders for this visit:  Acute abdomen -     Cancel: DG Abd 2 Views; Future -     Urinalysis, Complete -     Cancel: DG Abd 1 View; Future -     DG Abd Acute W/Chest; Future  Other orders -     Microscopic Examination    I am having Mr. Gorrell maintain his NITROSTAT, acetaminophen, fenofibrate micronized, levothyroxine, polyethylene glycol powder, tamsulosin, furosemide, LORazepam, carvedilol, digoxin, gabapentin, HYDROcodone-acetaminophen, donepezil, and DULoxetine.  No orders of the defined types were placed in this encounter.    Follow-up: Return if symptoms worsen or fail to improve, for cholesterol. Recommended patient proceed to emergency department for further evaluation. Claretta Fraise, M.D.

## 2016-10-04 NOTE — Telephone Encounter (Signed)
New message  Pt needs to reschedule appt from today, pt is not doing well and is going to the hospital  Pt needs surgical clearance prior to surgery in Jan.   No openings available/does not want to see PA  Please call back at [phone], daughter, Heinz Knuckles

## 2016-10-04 NOTE — H&P (Signed)
History and Physical  [name] [id number] DOB: Apr 17, 1926 DOA: 09/21/2016  Referring physician: Dr Thurnell Garbe, ED physician PCP: Claretta Fraise, MD  Outpatient Specialists:   Mindi Curling (Endo)  Hochrein (Cards)  Allyson Sabal (Derm)  Chief Complaint: Abdominal pain  HPI: [name] is a 82 y.o. male with a history of dementia, bladder cancer, persistent atrial fibrillation CHADS Vasc score of 3 on Coumadin, dilated cardiomyopathy, coronary artery disease, COPD, hypertension, hypothyroidism. Patient seen for 2-3 days of persistent abdominal pain that is described as diffuse and nonradiating. Pain is described as dull and cramping. He denies diarrhea, constipation, nausea, vomiting. He does admit to diminished appetite. Denies melena or rectal bleeding. He went and saw his primary care physician earlier today, who examined him and thought he had an acute abdomen and centimeter for further evaluation. Abdominal pain is not relieved by food or liquid. It is not changed with stooling, urination, belching, flatulence. No other palliating or provoking factors.  Emergency Department Course:  Examination emergency department revealed normal white count with CT of his abdomen that shows nonobstructing gallstones with no gallbladder dilation. No other acute findings  Laboratory examination did show an elevated INR that was greater than 10.  Review of Systems:    Pt denies any fevers, chills, nausea, vomiting, diarrhea, constipation,shortness of breath, dyspnea on exertion, orthopnea, cough, wheezing, palpitations, headache, vision changes, lightheadedness, dizziness, melena, rectal bleeding.  Review of systems are otherwise negative  Past Medical History:  Diagnosis Date  . Atrial fibrillation (HCC)    Persistent, treated with amiodarone and cardioversion  . CAD (coronary artery disease)    Last catheterization in August 2008 demonstrated the LAD 40% stenosis  . Cholecystitis   . COPD (chronic  obstructive pulmonary disease) ([place])   . Dilated cardiomyopathy ([place])    Felt to be secondary to atrial fibrillation. EF is about 40%  . Drug therapy    Chronic Coumadin therapy  . Dyslipidemia   . Hypertension   . Hypothyroidism    Possibly related to amiodarone   Past Surgical History:  Procedure Laterality Date  . CARDIAC CATHETERIZATION  05/2007   LAD 40% stenosis. Stent in the LAD had 20% in-stent restenosis. Followed by 40-50% distal stenosis. 50% diagonal stenosis. 40-50% circumflex stenosis. 70% OM1 stenosis and 30% ostial RCA stenosis. 30% proximal RCA stenosis.  . CORONARY STENT PLACEMENT    . HIP SURGERY Left    82yo   Social History:  reports that he has never smoked. He has never used smokeless tobacco. He reports that he does not drink alcohol or use drugs. Patient lives at [place]  . Amiodarone     Neuropathy  . Penicillins Itching    Has patient had a PCN reaction causing immediate rash, facial/tongue/throat swelling, SOB or lightheadedness with hypotension: Yesunknown Has patient had a PCN reaction causing severe rash involving mucus membranes or skin necrosis: unknown Has patient had a PCN reaction that required hospitalization Yesno  If all of the above answers are "NO", then may proceed with Ceph    Family History  Problem Relation Age of Onset  . Adopted: Yes  . Heart attack Mother   . Heart attack Father   . Diabetes Other   . Stroke Other       Prior to Admission medications   Medication Sig Start Date End Date Taking? Authorizing Provider  acetaminophen (TYLENOL) 500 MG tablet Take 1-2 tablets (500-1,000 mg total) by mouth 2 (two) times daily as needed. Patient taking  differently: Take 500-1,000 mg by mouth 2 (two) times daily as needed for mild pain.  03/28/16  Yes Cherre Robins, PharmD  donepezil (ARICEPT) 10 MG tablet Take 1 tablet (10 mg total) by mouth at bedtime. To help with memory and to help think more clearly  09/24/16  Yes Claretta Fraise, MD  warfarin (COUMADIN) 5 MG tablet Take 5 mg once daily except on Monday and Friday take 7.5 mg. Patient taking differently: Take 5 mg once daily 07/30/16  Yes Claretta Fraise, MD  amLODipine (NORVASC) 10 MG tablet TAKE 1 TABLET DAILY 07/12/16   Minus Breeding, MD  carvedilol (COREG) 12.5 MG tablet Take 0.5 tablets (6.25 mg total) by mouth 2 (two) times daily with a meal. 09/09/16   Claretta Fraise, MD  digoxin (LANOXIN) 0.125 MG tablet Take 1 tablet (125 mcg total) by mouth daily. 09/09/16   Claretta Fraise, MD  DULoxetine (CYMBALTA) 30 MG capsule Take 1 capsule (30 mg total) by mouth daily. For arthritis pain in the knees 09/24/16   Claretta Fraise, MD  escitalopram (LEXAPRO) 5 MG tablet Take 5 mg by mouth daily.  09/14/16   Historical Provider, MD  fenofibrate micronized (LOFIBRA) 134 MG capsule TAKE (1) CAPSULE DAILY BEFORE BREAKFAST. 07/18/16   Claretta Fraise, MD  furosemide (LASIX) 20 MG tablet TAKE (1) TABLET DAILY IN THE MORNING. 08/22/16   Claretta Fraise, MD  gabapentin (NEURONTIN) 600 MG tablet Take 1 tablet (600 mg total) by mouth 3 (three) times daily. 09/10/16   Claretta Fraise, MD  HYDROcodone-acetaminophen (NORCO/VICODIN) 5-325 MG tablet Take one every 6-8 hours for pain not helped by tylenol alone 09/17/16   Milton Ferguson, MD  levothyroxine (SYNTHROID, LEVOTHROID) 125 MCG tablet TAKE 1 TABLET DAILY 07/24/16   Claretta Fraise, MD  LORazepam (ATIVAN) 0.5 MG tablet Take 1 tablet (0.5 mg total) by mouth 2 (two) times daily as needed for anxiety. 09/09/16   Claretta Fraise, MD  NITROSTAT 0.4 MG SL tablet Place 1 tablet (0.4 mg total) under the tongue every 5 (five) minutesas needed for chest pain. 11/14/15   Claretta Fraise, MD  polyethylene glycol powder (GLYCOLAX/MIRALAX) powder Take 17 g by mouth daily as needed. For constipation 07/24/16   Claretta Fraise, MD  tamsulosin (FLOMAX) 0.4 MG CAPS capsule Take 2 capsules (0.8 mg total) by mouth daily after supper. For urine flow and  prostate 08/06/16   Claretta Fraise, MD    Physical Exam: BP 162/92   Pulse 84   Temp 98.1 F (36.7 C) (Oral)   Resp 25   Ht 5\' 6"  (1.676 m)   Wt 74.8 kg (165 lb)   SpO2 92%   BMI 26.63 kg/m   General: Elderly Caucasian male. Awake and alert and oriented x3. No acute cardiopulmonary distress.  HEENT: Normocephalic atraumatic.  Right and left ears normal in appearance.  Pupils equal, round, reactive to light. Extraocular muscles are intact. Sclerae anicteric and noninjected.  Moist mucosal membranes. No mucosal lesions.  Neck: Neck supple without lymphadenopathy. No carotid bruits. No masses palpated.  Cardiovascular: Regular rate with normal S1-S2 sounds. No murmurs, rubs, gallops auscultated. No JVD.  Respiratory: Good respiratory effort with no wheezes, rales, rhonchi. Lungs clear to auscultation bilaterally.  No accessory muscle use. Abdomen: Soft abdomen with pain with palpation right greater than left. There is no rebound tenderness, however there is mild guarding. The abdomen is relatively nondistended. Hyperactive bowel sounds. No masses or hepatosplenomegaly  Skin: No rashes, lesions, or ulcerations.  Dry, warm to touch. 2+ dorsalis  pedis and radial pulses. Musculoskeletal: No calf or leg pain. All major joints not erythematous nontender.  No upper or lower joint deformation.  Good ROM.  No contractures  Psychiatric: Intact judgment and insight. Pleasant and cooperative. Neurologic: No focal neurological deficits. Strength is 5/5 and symmetric in upper and lower extremities.  Cranial nerves II through XII are grossly intact.           Labs on Admission: I have personally reviewed following labs and imaging studies  CBC:  Recent Labs Lab 09/30/2016 1725  WBC 6.2  NEUTROABS 4.3  HGB 15.4  HCT 47.0  MCV 79.7  PLT [id number]   Basic Metabolic Panel:  Recent Labs Lab 10/10/2016 1725  NA 136  K 3.4*  CL 100*  CO2 25  GLUCOSE 140*  BUN 10  CREATININE 1.27*  CALCIUM 9.4    GFR: Estimated Creatinine Clearance: 36.3 mL/min (by C-G formula based on SCr of 1.27 mg/dL (H)). Liver Function Tests:  Recent Labs Lab 09/14/2016 1725  AST 22  ALT 14*  ALKPHOS 67  BILITOT 0.7  PROT 7.7  ALBUMIN 4.2    Recent Labs Lab 10/03/2016 1725  LIPASE 19   No results for input(s): AMMONIA in the last 168 hours. Coagulation Profile:  Recent Labs Lab 10/13/2016 1725  INR >10.00*   Cardiac Enzymes:  Recent Labs Lab 09/13/2016 1725  TROPONINI <0.03   BNP (last 3 results) No results for input(s): PROBNP in the last 8760 hours. HbA1C: No results for input(s): HGBA1C in the last 72 hours. CBG: No results for input(s): GLUCAP in the last 168 hours. Lipid Profile: No results for input(s): CHOL, HDL, LDLCALC, TRIG, CHOLHDL, LDLDIRECT in the last 72 hours. Thyroid Function Tests: No results for input(s): TSH, T4TOTAL, FREET4, T3FREE, THYROIDAB in the last 72 hours. Anemia Panel: No results for input(s): VITAMINB12, FOLATE, FERRITIN, TIBC, IRON, RETICCTPCT in the last 72 hours. Urine analysis:    Component Value Date/Time   COLORURINE YELLOW 10/11/2016 [place] 09/19/2016 [place] 10/08/2016 1556   LABSPEC 1.013 09/18/2016 1854   PHURINE 5.0 09/19/2016 1854   GLUCOSEU NEGATIVE 10/08/2016 1854   HGBUR SMALL (A) 09/30/2016 1854   BILIRUBINUR NEGATIVE 10/13/2016 1854   BILIRUBINUR 2+ (A) 10/11/2016 1556   KETONESUR NEGATIVE 09/25/2016 1854   PROTEINUR NEGATIVE 09/25/2016 1854   UROBILINOGEN negative 11/10/2015 1149   UROBILINOGEN 0.2 03/26/2015 1140   NITRITE NEGATIVE 09/20/2016 1854   LEUKOCYTESUR NEGATIVE 10/03/2016 1854   LEUKOCYTESUR Negative 10/11/2016 1556   Sepsis Labs: @LABRCNTIP (procalcitonin:4,lacticidven:4) ) Recent Results (from the past 240 hour(s))  Microscopic Examination     Status: Abnormal   Collection Time: 10/03/2016  3:56 PM  Result Value Ref Range Status   WBC, UA None seen 0 - 5 /hpf Final   RBC,  UA 3-10 (A) 0 - 2 /hpf Final   Epithelial Cells (non renal) 0-10 0 - 10 /hpf Final   Renal Epithel, UA 0-10 (A) None seen /hpf Final   Casts Present None seen /lpf Final   Cast Type Hyaline casts N/A Final   Mucus, UA Present Not Estab. Final   Bacteria, UA None seen None seen/Few Final     Radiological Exams on Admission: Ct Abdomen Pelvis W Contrast  Result Date: 09/23/2016 CLINICAL DATA:  Severe abdominal pain, history of bladder cancer EXAM: CT ABDOMEN AND PELVIS WITH CONTRAST TECHNIQUE: Multidetector CT imaging of the abdomen and pelvis was performed using the standard protocol following bolus administration  of intravenous contrast. CONTRAST:  100 mL Isovue 300 IV COMPARISON:  09/03/2016 FINDINGS: Lower chest: Mild subpleural reticulation at the lung bases. Hepatobiliary: The liver is within normal limits. Gallbladder is notable for a 14 mm layering gallstone (series 2/image 28). No associated inflammatory changes. Pancreas: Within normal limits. Spleen: Within normal limits. Adrenals/Urinary Tract: Adrenal glands are within normal limits. 1.5 cm lateral left upper pole renal cyst. Right kidney is within normal limits. No hydronephrosis. 1.5 x 3.4 cm irregular enhancing mass along the left anterior bladder wall (series 2/ image 81) with additional underlying irregular wall thickening (series 2/ image 79), corresponding to the patient's known primary bladder neoplasm. Stomach/Bowel: Stomach is within normal limits. No evidence of bowel obstruction. Normal appendix (series 2/ image 45). Left colonic diverticulosis, without evidence of diverticulitis. Vascular/Lymphatic: No evidence of abdominal aortic aneurysm. Atherosclerotic calcifications of the abdominal aorta and branch vessels. No suspicious abdominopelvic lymphadenopathy. Reproductive: Mild prostatomegaly. Other: No abdominopelvic ascites. Small fat containing right inguinal hernia (series 2/ image 83). Musculoskeletal: Degenerative changes of  the visualized thoracolumbar spine. IMPRESSION: Irregular enhancing mass along the left anterior bladder wall, corresponding to patient's known primary bladder neoplasm. Cholelithiasis, without associated inflammatory changes. No evidence of bowel obstruction.  Normal appendix. No CT findings to account for the patient's abdominal pain. Additional stable ancillary findings as above. Electronically Signed   By: Julian Hy M.D.   On: 09/16/2016 18:55   Dg Abd Acute W/chest  Result Date: 09/22/2016 CLINICAL DATA:  Patient states that he's had severe abdominal pain since last night, denies vomiting/diarrhea EXAM: DG ABDOMEN ACUTE W/ 1V CHEST COMPARISON:  09/17/2016 previous FINDINGS: Heart size normal.  Atheromatous aorta. Lungs are clear. No effusion. No free air. Normal bowel gas pattern. There are no abnormal calcifications. Facet disease in the lower lumbar spine. IMPRESSION: No acute cardiopulmonary disease. Negative abdominal radiographs. Electronically Signed   By: Lucrezia Europe M.D.   On: 09/18/2016 15:35    EKG: Independently reviewed. Atria fibrillation with PVCs. Ventricular rate of 98. No acute ST changes.  Assessment/Plan: Principal Problem:   Supratherapeutic INR Active Problems:   Hypertensive heart and renal disease with renal failure   Paroxysmal atrial fibrillation (HCC)   Dementia   Cholelithiasis   Abdominal pain    This patient was discussed with the ED physician, including pertinent vitals, physical exam findings, labs, and imaging.  We also discussed care given by the ED provider.  #1 supratherapeutic INR  Observation  Recheck INR at midnight and tomorrow morning  Another 5 mg of vitamin K was given in the emergency department for total of 10 mg orally #2 abdominal pain  Uncertain of etiology. Will obtain ultrasound of the abdomen. No evidence of obstruction. If continues to have diffuse pain, may be reasonable to obtain CTA of abdomen #3 cholelithiasis  [place]  tomorrow #4 paroxysmal a fib.  Continue rate control  carvedilol #5 hypertension  Continue home regimen #6 dementia  Continue aricept  DVT prophylaxis: supertherapeutic INR Consultants: none Code Status: Full code Family Communication: daughter in room  Disposition Plan: pt to return home following observation   Truett Mainland, DO [facility] Pager [phone]  If 7PM-7AM, please contact night-coverage [email or web address] Password [email or web address]

## 2016-10-04 NOTE — Telephone Encounter (Signed)
Spoke with daughter notified that Dr Percival Spanish is going to be on vacation-rescheduled with britany strader 10-11-16@1045am  and also spoke with [facility] and rescheduled coumadin clinic appt for warfarin dosing for surgery procedure scheduled as directed by [facility] for appt

## 2016-10-04 NOTE — ED Triage Notes (Signed)
Per EMS pt has a history of bladder cancer.  Pt is currently experiencing severe abdominal pain in all quadrants.  Per PCP specific gravity is abnormal and he was supposed to have first appt for bladder cancer, but was unable to go.    Pt has a hx of AFIB.  BP 142/81 BP 134/85

## 2016-10-05 ENCOUNTER — Observation Stay (HOSPITAL_COMMUNITY): Payer: Medicare Other

## 2016-10-05 DIAGNOSIS — K8020 Calculus of gallbladder without cholecystitis without obstruction: Secondary | ICD-10-CM | POA: Diagnosis not present

## 2016-10-05 DIAGNOSIS — R791 Abnormal coagulation profile: Secondary | ICD-10-CM | POA: Diagnosis not present

## 2016-10-05 LAB — BASIC METABOLIC PANEL
ANION GAP: 8 (ref 5–15)
ANION GAP: 5 (ref 5–15)
BUN: 8 mg/dL (ref 6–20)
BUN: 9 mg/dL (ref 6–20)
CHLORIDE: 105 mmol/L (ref 101–111)
CO2: 26 mmol/L (ref 22–32)
CO2: 27 mmol/L (ref 22–32)
CREATININE: 0.96 mg/dL (ref 0.61–1.24)
Calcium: 8.5 mg/dL — ABNORMAL LOW (ref 8.9–10.3)
Calcium: 8.4 mg/dL — ABNORMAL LOW (ref 8.9–10.3)
Chloride: 104 mmol/L (ref 101–111)
Creatinine, Ser: 1.13 mg/dL (ref 0.61–1.24)
GFR calc Af Amer: >60 mL/min (ref >60)
GFR calc non Af Amer: 56 mL/min — ABNORMAL LOW (ref >60)
GLUCOSE: 161 mg/dL — AB (ref 65–99)
Glucose, Bld: 128 mg/dL — ABNORMAL HIGH (ref 65–99)
POTASSIUM: 3.7 mmol/L (ref 3.5–5.1)
Potassium: 3.4 mmol/L — ABNORMAL LOW (ref 3.5–5.1)
SODIUM: 138 mmol/L (ref 135–145)
Sodium: 137 mmol/L (ref 135–145)

## 2016-10-05 LAB — PROTIME-INR
INR: 9.91 — AB
Prothrombin Time: >90 seconds — ABNORMAL HIGH (ref 11.4–15.2)
Prothrombin Time: 82.7 seconds — ABNORMAL HIGH (ref 11.4–15.2)

## 2016-10-05 MED ORDER — TRAMADOL HCL 50 MG PO TABS
50.0000 mg | ORAL_TABLET | Freq: Four times a day (QID) | ORAL | Status: DC | PRN
  Administered 2016-10-05 – 2016-10-08 (×3): 50 mg via ORAL
  Filled 2016-10-05 (×4): qty 1

## 2016-10-05 MED ORDER — POTASSIUM CHLORIDE CRYS ER 20 MEQ PO TBCR
20.0000 meq | EXTENDED_RELEASE_TABLET | Freq: Once | ORAL | Status: AC
  Administered 2016-10-05: 20 meq via ORAL
  Filled 2016-10-05: qty 1

## 2016-10-05 MED ORDER — PHYTONADIONE 5 MG PO TABS
5.0000 mg | ORAL_TABLET | Freq: Once | ORAL | Status: AC
  Administered 2016-10-05: 5 mg via ORAL
  Filled 2016-10-05: qty 1

## 2016-10-05 NOTE — Progress Notes (Addendum)
CRITICAL VALUE ALERT  Critical value received:  INR > 10.00  Date of notification:  10/05/2016  Time of notification:  0852  Critical value read back:Yes  Nurse who received alert:  Vista Deck  MD notified (1st page):  Dr. Wendee Beavers  Time of first page:  0900  Responding MD. 0900  RESPONSE: Per MD pharmacy plan to repeat INR @ 1000. Will consider Vitamin K after repeat INR. Received order to transfuse FFP 1-2 units if patient is bleeding. Pt has no signs of bleeding at this time. Will continue to monitor patient for signs of bleeding.

## 2016-10-05 NOTE — Care Management Obs Status (Signed)
[facility] NOTIFICATION   Patient Details  Name: [name] MRN: [id number] Date of Birth: 1926/07/16   Medicare Observation Status Notification Given:  Yes    Briant Sites, RN 10/05/2016, 3:49 PM

## 2016-10-05 NOTE — Progress Notes (Signed)
PROGRESS NOTE    [name]  [id number] DOB: Jul 29, 1926 DOA: 10/10/2016 PCP: Claretta Fraise, MD   Brief Narrative:  82 y.o. male with a history of dementia, bladder cancer, persistent atrial fibrillation CHADS Vasc score of 3 on Coumadin, dilated cardiomyopathy, coronary artery disease, COPD, hypertension, hypothyroidism. Patient seen for 2-3 days of persistent abdominal pain that is described as diffuse and nonradiating.  Assessment & Plan:   Principal Problem:   Supratherapeutic INR - Patient had vitamin K administered yesterday. - INR still near 10 as such will administer another 5 of vitamin K today. Discussed with nursing  Active Problems:   Hypertensive heart and renal disease with renal failure - Stable currently, low blood normal pressure as such will discontinue amlodipine  Hypokalemia -Mild will replace and reassess next a.m.    Paroxysmal atrial fibrillation (HCC) - Rate controlled currently, continue carvedilol    Dementia  - Stable continue home medication regimen    Cholelithiasis - Patient not complaining of any abdominal discomfort currently. Before any surgical consideration patient will need to have his INR improved. We'll not consult general surgery until INR has trended down    Abdominal pain - Treat supportively secondary to cholecystitis/cholelithiasis  DVT prophylaxis: supratherapeutic INR Code Status: Full Family Communication: Discussed directly with patient Disposition Plan: Pending improvement in INR levels   Consultants:   None  Procedures: None   Antimicrobials: None  Subjective: Pt has no new complaints, no acute issues overnight.  Objective: Vitals:   10/08/2016 2003 09/22/2016 2030 10/05/16 0458 10/05/16 0909  BP:  (!) 153/77 (!) 143/80 (!) 114/48  Pulse: 84 96 (!) 41 86  Resp: 25 20 20 20   Temp:  97.5 F (36.4 C) 97.5 F (36.4 C)   TempSrc:  Oral Oral   SpO2: 92% 91% 92% 93%  Weight:  74.2 kg (163 lb 9.6 oz)    Height:         Intake/Output Summary (Last 24 hours) at 10/05/16 1342 Last data filed at 10/09/2016 2124  Gross per 24 hour  Intake                0 ml  Output              550 ml  Net             -550 ml   Filed Weights   10/05/2016 1717 09/23/2016 2030  Weight: 74.8 kg (165 lb) 74.2 kg (163 lb 9.6 oz)    Examination:  General exam: Appears calm and comfortable, in nad. Respiratory system: Clear to auscultation. Respiratory effort normal. Cardiovascular system: IRR. No JVD, murmurs, rubs, gallops or clicks.  Gastrointestinal system: Abdomen is nondistended, soft and no guarding.  No organomegaly or masses felt. Normal bowel sounds heard. Central nervous system: Alert and awake. No focal neurological deficits. Extremities: Symmetric 5 x 5 power. Skin: No rashes, lesions or ulcers, on limited exam. Psychiatry: difficult assessment due to dementia. Mood & affect appropriate.   Data Reviewed: I have personally reviewed following labs and imaging studies  CBC:  Recent Labs Lab 10/11/2016 1725  WBC 6.2  NEUTROABS 4.3  HGB 15.4  HCT 47.0  MCV 79.7  PLT Q000111Q   Basic Metabolic Panel:  Recent Labs Lab 10/01/2016 1725 10/05/16 0642  NA 136 138  K 3.4* 3.4*  CL 100* 104  CO2 25 26  GLUCOSE 140* 128*  BUN 10 8  CREATININE 1.27* 0.96  CALCIUM 9.4 8.5*   GFR: Estimated Creatinine Clearance:  48 mL/min (by C-G formula based on SCr of 0.96 mg/dL). Liver Function Tests:  Recent Labs Lab 10/02/2016 1725  AST 22  ALT 14*  ALKPHOS 67  BILITOT 0.7  PROT 7.7  ALBUMIN 4.2    Recent Labs Lab 10/07/2016 1725  LIPASE 19   No results for input(s): AMMONIA in the last 168 hours. Coagulation Profile:  Recent Labs Lab 09/28/2016 1725 10/05/16 0023 10/05/16 0642 10/05/16 1210  INR >10.00* >10.00* >10.00* 9.91*   Cardiac Enzymes:  Recent Labs Lab 09/19/2016 1725  TROPONINI <0.03   BNP (last 3 results) No results for input(s): PROBNP in the last 8760 hours. HbA1C: No results for  input(s): HGBA1C in the last 72 hours. CBG: No results for input(s): GLUCAP in the last 168 hours. Lipid Profile: No results for input(s): CHOL, HDL, LDLCALC, TRIG, CHOLHDL, LDLDIRECT in the last 72 hours. Thyroid Function Tests: No results for input(s): TSH, T4TOTAL, FREET4, T3FREE, THYROIDAB in the last 72 hours. Anemia Panel: No results for input(s): VITAMINB12, FOLATE, FERRITIN, TIBC, IRON, RETICCTPCT in the last 72 hours. Sepsis Labs:  Recent Labs Lab 10/13/2016 1906  LATICACIDVEN 1.0    Recent Results (from the past 240 hour(s))  Microscopic Examination     Status: Abnormal   Collection Time: 09/13/2016  3:56 PM  Result Value Ref Range Status   WBC, UA None seen 0 - 5 /hpf Final   RBC, UA 3-10 (A) 0 - 2 /hpf Final   Epithelial Cells (non renal) 0-10 0 - 10 /hpf Final   Renal Epithel, UA 0-10 (A) None seen /hpf Final   Casts Present None seen /lpf Final   Cast Type Hyaline casts N/A Final   Mucus, UA Present Not Estab. Final   Bacteria, UA None seen None seen/Few Final    Radiology Studies: Ct Abdomen Pelvis W Contrast  Result Date: 09/13/2016 CLINICAL DATA:  Severe abdominal pain, history of bladder cancer EXAM: CT ABDOMEN AND PELVIS WITH CONTRAST TECHNIQUE: Multidetector CT imaging of the abdomen and pelvis was performed using the standard protocol following bolus administration of intravenous contrast. CONTRAST:  100 mL Isovue 300 IV COMPARISON:  09/03/2016 FINDINGS: Lower chest: Mild subpleural reticulation at the lung bases. Hepatobiliary: The liver is within normal limits. Gallbladder is notable for a 14 mm layering gallstone (series 2/image 28). No associated inflammatory changes. Pancreas: Within normal limits. Spleen: Within normal limits. Adrenals/Urinary Tract: Adrenal glands are within normal limits. 1.5 cm lateral left upper pole renal cyst. Right kidney is within normal limits. No hydronephrosis. 1.5 x 3.4 cm irregular enhancing mass along the left anterior bladder  wall (series 2/ image 81) with additional underlying irregular wall thickening (series 2/ image 79), corresponding to the patient's known primary bladder neoplasm. Stomach/Bowel: Stomach is within normal limits. No evidence of bowel obstruction. Normal appendix (series 2/ image 45). Left colonic diverticulosis, without evidence of diverticulitis. Vascular/Lymphatic: No evidence of abdominal aortic aneurysm. Atherosclerotic calcifications of the abdominal aorta and branch vessels. No suspicious abdominopelvic lymphadenopathy. Reproductive: Mild prostatomegaly. Other: No abdominopelvic ascites. Small fat containing right inguinal hernia (series 2/ image 83). Musculoskeletal: Degenerative changes of the visualized thoracolumbar spine. IMPRESSION: Irregular enhancing mass along the left anterior bladder wall, corresponding to patient's known primary bladder neoplasm. Cholelithiasis, without associated inflammatory changes. No evidence of bowel obstruction.  Normal appendix. No CT findings to account for the patient's abdominal pain. Additional stable ancillary findings as above. Electronically Signed   By: Julian Hy M.D.   On: 10/11/2016 18:55  Dg Abd Acute W/chest  Result Date: 09/25/2016 CLINICAL DATA:  Patient states that he's had severe abdominal pain since last night, denies vomiting/diarrhea EXAM: DG ABDOMEN ACUTE W/ 1V CHEST COMPARISON:  09/17/2016 previous FINDINGS: Heart size normal.  Atheromatous aorta. Lungs are clear. No effusion. No free air. Normal bowel gas pattern. There are no abnormal calcifications. Facet disease in the lower lumbar spine. IMPRESSION: No acute cardiopulmonary disease. Negative abdominal radiographs. Electronically Signed   By: Lucrezia Europe M.D.   On: 10/02/2016 15:35   US Abdomen Limited Ruq  Result Date: 10/05/2016 CLINICAL DATA:  Abdominal pain 2 days. EXAM: US ABDOMEN LIMITED - RIGHT UPPER QUADRANT COMPARISON:  CT 10/05/2016 and ultrasound 08/27/2016 FINDINGS:  Gallbladder: Cholelithiasis with several stones over the dependent portion of the gallbladder/gallbladder neck. No wall thickening. Negative sonographic Murphy's sign. No adjacent free fluid. Common bile duct: Diameter: 2.9 mm. Liver: No focal lesion identified. Within normal limits in parenchymal echogenicity. IMPRESSION: Cholelithiasis without additional sonographic evidence to suggest cholecystitis. Electronically Signed   By: Marin Olp M.D.   On: 10/05/2016 10:34        Scheduled Meds: . amLODipine  10 mg Oral Daily  . carvedilol  6.25 mg Oral BID WC  . citalopram  20 mg Oral Daily  . dicyclomine  10 mg Oral TID AC & HS  . digoxin  125 mcg Oral Daily  . donepezil  10 mg Oral QHS  . DULoxetine  30 mg Oral Daily  . fenofibrate  160 mg Oral Daily  . furosemide  20 mg Oral Daily  . gabapentin  600 mg Oral TID  . levothyroxine  125 mcg Oral QAC breakfast  . tamsulosin  0.8 mg Oral QPC supper   Continuous Infusions: . sodium chloride 100 mL/hr at 09/22/2016 1741     LOS: 0 days    Time spent: > 35 minutes  Velvet Bathe, MD [facility] Pager [phone]  If 7PM-7AM, please contact night-coverage [email or web address] Password [email or web address] 10/05/2016, 1:42 PM

## 2016-10-05 NOTE — Progress Notes (Signed)
CRITICAL VALUE ALERT  Critical value received:  INR 9.91  Date of notification:  10/05/2016  Time of notification:  1300  Critical value read back:Yes  Nurse who received alert:  Vista Deck  MD notified (1st page):  [phone]  Responding MD:  Dr. Wendee Beavers  Time MD responded:  1305  Response: Received verbal order to administer 5 mg Vitamin K to patient.

## 2016-10-05 NOTE — Progress Notes (Signed)
ANTICOAGULATION CONSULT NOTE - Initial Consult  Pharmacy Consult for Warfarin Indication: atrial fibrillation  Allergies  Allergen Reactions  . Amiodarone     Neuropathy  . Penicillins Itching    Has patient had a PCN reaction causing immediate rash, facial/tongue/throat swelling, SOB or lightheadedness with hypotension: Yesunknown Has patient had a PCN reaction causing severe rash involving mucus membranes or skin necrosis: unknown Has patient had a PCN reaction that required hospitalization Yesno  If all of the above answers are "NO", then may proceed with Ceph    Patient Measurements: Height: 5\' 6"  (167.6 cm) Weight: 163 lb 9.6 oz (74.2 kg) IBW/kg (Calculated) : 63.8 Heparin Dosing Weight:   Vital Signs: Temp: 97.5 F (36.4 C) (12/23 0458) Temp Source: Oral (12/23 0458) BP: 114/48 (12/23 0909) Pulse Rate: 86 (12/23 0909)  Labs:  Recent Labs  09/18/2016 1725 10/05/16 0023 10/05/16 0642 10/05/16 1210  HGB 15.4  --   --   --   HCT 47.0  --   --   --   PLT 301  --   --   --   LABPROT 88.9* >90.0* >90.0* 82.7*  INR >10.00* >10.00* >10.00* 9.91*  CREATININE 1.27*  --  0.96  --   TROPONINI <0.03  --   --   --     Estimated Creatinine Clearance: 48 mL/min (by C-G formula based on SCr of 0.96 mg/dL).   Medical History: Past Medical History:  Diagnosis Date  . Atrial fibrillation (HCC)    Persistent, treated with amiodarone and cardioversion  . CAD (coronary artery disease)    Last catheterization in August 2008 demonstrated the LAD 40% stenosis  . Cholecystitis   . COPD (chronic obstructive pulmonary disease) ([place])   . Dilated cardiomyopathy ([place])    Felt to be secondary to atrial fibrillation. EF is about 40%  . Drug therapy    Chronic Coumadin therapy  . Dyslipidemia   . Hypertension   . Hypothyroidism    Possibly related to amiodarone    Medications:  Prescriptions Prior to Admission  Medication Sig Dispense Refill Last Dose  . acetaminophen  (TYLENOL) 500 MG tablet Take 1-2 tablets (500-1,000 mg total) by mouth 2 (two) times daily as needed. (Patient taking differently: Take 500-1,000 mg by mouth 2 (two) times daily as needed for mild pain. ) 30 tablet 0 Past Month at Unknown time  . donepezil (ARICEPT) 10 MG tablet Take 1 tablet (10 mg total) by mouth at bedtime. To help with memory and to help think more clearly 30 tablet 2 10/03/2016 at Unknown time  . warfarin (COUMADIN) 5 MG tablet Take 5 mg once daily except on Monday and Friday take 7.5 mg. (Patient taking differently: Take 5 mg once daily) 32 tablet 2 10/03/2016 at Unknown time  . amLODipine (NORVASC) 10 MG tablet TAKE 1 TABLET DAILY 30 tablet 10 Taking  . carvedilol (COREG) 12.5 MG tablet Take 0.5 tablets (6.25 mg total) by mouth 2 (two) times daily with a meal. 30 tablet 0 Taking  . digoxin (LANOXIN) 0.125 MG tablet Take 1 tablet (125 mcg total) by mouth daily. 30 tablet 2 Taking  . DULoxetine (CYMBALTA) 30 MG capsule Take 1 capsule (30 mg total) by mouth daily. For arthritis pain in the knees 30 capsule 2 Taking  . escitalopram (LEXAPRO) 5 MG tablet Take 5 mg by mouth daily.      . fenofibrate micronized (LOFIBRA) 134 MG capsule TAKE (1) CAPSULE DAILY BEFORE BREAKFAST. 90 capsule 0 Taking  .  furosemide (LASIX) 20 MG tablet TAKE (1) TABLET DAILY IN THE MORNING. 30 tablet 5 Taking  . gabapentin (NEURONTIN) 600 MG tablet Take 1 tablet (600 mg total) by mouth 3 (three) times daily. 90 tablet 2 Taking  . HYDROcodone-acetaminophen (NORCO/VICODIN) 5-325 MG tablet Take one every 6-8 hours for pain not helped by tylenol alone 20 tablet 0 Taking  . levothyroxine (SYNTHROID, LEVOTHROID) 125 MCG tablet TAKE 1 TABLET DAILY 30 tablet 10 Taking  . LORazepam (ATIVAN) 0.5 MG tablet Take 1 tablet (0.5 mg total) by mouth 2 (two) times daily as needed for anxiety. 30 tablet 1 Taking  . NITROSTAT 0.4 MG SL tablet Place 1 tablet (0.4 mg total) under the tongue every 5 (five) minutesas needed for  chest pain. 25 tablet 0 Taking  . polyethylene glycol powder (GLYCOLAX/MIRALAX) powder Take 17 g by mouth daily as needed. For constipation 3350 g 5 Taking  . tamsulosin (FLOMAX) 0.4 MG CAPS capsule Take 2 capsules (0.8 mg total) by mouth daily after supper. For urine flow and prostate 60 capsule 3 Taking    Assessment: Continuation of warfarin PTA for AFIB INR elevated on admission and remains elevated. MD order to transfuse FFP 1-2 units if bleeding, no signs of bleeding Vitamin K 10 mg given last evening. Vitamin K 5 mg ordered by MD today  Goal of Therapy:  INR 2-3 Monitor platelets by anticoagulation protocol: Yes   Plan:  No coumadin due to elevated INR INR latter this evening INR/PT daily Monitor for signs of bleeding  Abner Greenspan, Chadd Tollison Bennett 10/05/2016,1:18 PM

## 2016-10-05 NOTE — Significant Event (Signed)
CRITICAL VALUE STICKER  CRITICAL VALUE: INR > 10  RECEIVER (on-site recipient of call): Jerilee Hoh RN   DATE & TIME NOTIFIED: 10/05/16 0258  MESSENGER (representative from lab): JUW  MD NOTIFIED: AHal Hope   TIME OF NOTIFICATION: 0259  RESPONSE: MD phoned pharmicist on call and plan to repeat INR at 0800 which will be 12 hours past last vitamin k dose.  Will continue to monitor for sings of bleeding.

## 2016-10-06 LAB — CBC
HEMATOCRIT: 40.2 % (ref 39.0–52.0)
Hemoglobin: 12.8 g/dL — ABNORMAL LOW (ref 13.0–17.0)
MCH: 26 pg (ref 26.0–34.0)
MCHC: 31.8 g/dL (ref 30.0–36.0)
MCV: 81.5 fL (ref 78.0–100.0)
Platelets: 272 10*3/uL (ref 150–400)
RBC: 4.93 MIL/uL (ref 4.22–5.81)
RDW: 15.4 % (ref 11.5–15.5)
WBC: 4.4 10*3/uL (ref 4.0–10.5)

## 2016-10-06 LAB — PROTIME-INR
INR: 3.27
Prothrombin Time: 54.7 seconds — ABNORMAL HIGH (ref 11.4–15.2)
Prothrombin Time: 34 seconds — ABNORMAL HIGH (ref 11.4–15.2)

## 2016-10-06 MED ORDER — WARFARIN - PHARMACIST DOSING INPATIENT
Status: DC
  Administered 2016-10-09 – 2016-10-11 (×3)

## 2016-10-06 NOTE — Progress Notes (Signed)
PROGRESS NOTE    [name]  [id number] DOB: Jul 31, 1926 DOA: 09/30/2016 PCP: Claretta Fraise, MD   Brief Narrative:  82 y.o. male with a history of dementia, bladder cancer, persistent atrial fibrillation CHADS Vasc score of 3 on Coumadin, dilated cardiomyopathy, coronary artery disease, COPD, hypertension, hypothyroidism. Patient seen for 2-3 days of persistent abdominal pain that is described as diffuse and nonradiating.  Assessment & Plan:   Principal Problem:   Supratherapeutic INR - Patient had vitamin K administered yesterday. - trending down. INR on last check 3.27. Will continue to monitor do not want to over correct.  Active Problems:   Hypertensive heart and renal disease with renal failure - Stable currently, low blood normal pressure as such will discontinue amlodipine  Hypokalemia -Mild will replace and reassess next a.m.    Paroxysmal atrial fibrillation (HCC) - Rate controlled currently, continue carvedilol    Dementia  - Stable continue home medication regimen    Cholelithiasis - Patient not complaining of any abdominal discomfort currently. Recommend outpatient evaluation. Tolerating diet.    Abdominal pain - Treat supportively secondary to cholecystitis/cholelithiasis  DVT prophylaxis: supratherapeutic INR Code Status: Full Family Communication: Discussed directly with patient Disposition Plan: Pending improvement in INR levels   Consultants:   None  Procedures: None   Antimicrobials: None  Subjective: Pt has no new complaints, no acute issues overnight.  Objective: Vitals:   10/05/16 1610 10/05/16 1651 10/05/16 2200 10/06/16 0544  BP: (!) 126/55 (!) 126/55 (!) 128/58 (!) 131/54  Pulse: (!) 57 80 70 76  Resp: 20  20 20   Temp: 98.2 F (36.8 C)  98.2 F (36.8 C) 98.5 F (36.9 C)  TempSrc: Oral  Oral Oral  SpO2: 90%  92% 92%  Weight:      Height:        Intake/Output Summary (Last 24 hours) at 10/06/16 1507 Last data filed at  10/06/16 0900  Gross per 24 hour  Intake          1198.33 ml  Output             1200 ml  Net            -1.67 ml   Filed Weights   10/05/2016 1717 09/14/2016 2030  Weight: 74.8 kg (165 lb) 74.2 kg (163 lb 9.6 oz)    Examination:  General exam: Appears calm and comfortable, in nad. Respiratory system: Clear to auscultation. Respiratory effort normal. Cardiovascular system: IRR. No JVD, murmurs, rubs, gallops or clicks.  Gastrointestinal system: Abdomen is nondistended, soft and no guarding.  No organomegaly or masses felt. Normal bowel sounds heard. Central nervous system: Alert and awake. No focal neurological deficits. Extremities: Symmetric 5 x 5 power. Skin: No rashes, lesions or ulcers, on limited exam. Psychiatry: difficult assessment due to dementia. Mood & affect appropriate.   Data Reviewed: I have personally reviewed following labs and imaging studies  CBC:  Recent Labs Lab 09/15/2016 1725 10/06/16 0659  WBC 6.2 4.4  NEUTROABS 4.3  --   HGB 15.4 12.8*  HCT 47.0 40.2  MCV 79.7 81.5  PLT 301 Q000111Q   Basic Metabolic Panel:  Recent Labs Lab 10/02/2016 1725 10/05/16 0642 10/05/16 1452  NA 136 138 137  K 3.4* 3.4* 3.7  CL 100* 104 105  CO2 25 26 27   GLUCOSE 140* 128* 161*  BUN 10 8 9   CREATININE 1.27* 0.96 1.13  CALCIUM 9.4 8.5* 8.4*   GFR: Estimated Creatinine Clearance: 40.8 mL/min (by C-G formula based  on SCr of 1.13 mg/dL). Liver Function Tests:  Recent Labs Lab 09/22/2016 1725  AST 22  ALT 14*  ALKPHOS 67  BILITOT 0.7  PROT 7.7  ALBUMIN 4.2    Recent Labs Lab 10/10/2016 1725  LIPASE 19   No results for input(s): AMMONIA in the last 168 hours. Coagulation Profile:  Recent Labs Lab 10/05/16 0023 10/05/16 0642 10/05/16 1210 10/05/16 2252 10/06/16 0659  INR >10.00* >10.00* 9.91* >5.00* 3.27   Cardiac Enzymes:  Recent Labs Lab 09/30/2016 1725  TROPONINI <0.03   BNP (last 3 results) No results for input(s): PROBNP in the last 8760  hours. HbA1C: No results for input(s): HGBA1C in the last 72 hours. CBG: No results for input(s): GLUCAP in the last 168 hours. Lipid Profile: No results for input(s): CHOL, HDL, LDLCALC, TRIG, CHOLHDL, LDLDIRECT in the last 72 hours. Thyroid Function Tests: No results for input(s): TSH, T4TOTAL, FREET4, T3FREE, THYROIDAB in the last 72 hours. Anemia Panel: No results for input(s): VITAMINB12, FOLATE, FERRITIN, TIBC, IRON, RETICCTPCT in the last 72 hours. Sepsis Labs:  Recent Labs Lab 09/22/2016 1906  LATICACIDVEN 1.0    Recent Results (from the past 240 hour(s))  Microscopic Examination     Status: Abnormal   Collection Time: 09/20/2016  3:56 PM  Result Value Ref Range Status   WBC, UA None seen 0 - 5 /hpf Final   RBC, UA 3-10 (A) 0 - 2 /hpf Final   Epithelial Cells (non renal) 0-10 0 - 10 /hpf Final   Renal Epithel, UA 0-10 (A) None seen /hpf Final   Casts Present None seen /lpf Final   Cast Type Hyaline casts N/A Final   Mucus, UA Present Not Estab. Final   Bacteria, UA None seen None seen/Few Final    Radiology Studies: Ct Abdomen Pelvis W Contrast  Result Date: 10/07/2016 CLINICAL DATA:  Severe abdominal pain, history of bladder cancer EXAM: CT ABDOMEN AND PELVIS WITH CONTRAST TECHNIQUE: Multidetector CT imaging of the abdomen and pelvis was performed using the standard protocol following bolus administration of intravenous contrast. CONTRAST:  100 mL Isovue 300 IV COMPARISON:  09/03/2016 FINDINGS: Lower chest: Mild subpleural reticulation at the lung bases. Hepatobiliary: The liver is within normal limits. Gallbladder is notable for a 14 mm layering gallstone (series 2/image 28). No associated inflammatory changes. Pancreas: Within normal limits. Spleen: Within normal limits. Adrenals/Urinary Tract: Adrenal glands are within normal limits. 1.5 cm lateral left upper pole renal cyst. Right kidney is within normal limits. No hydronephrosis. 1.5 x 3.4 cm irregular enhancing mass  along the left anterior bladder wall (series 2/ image 81) with additional underlying irregular wall thickening (series 2/ image 79), corresponding to the patient's known primary bladder neoplasm. Stomach/Bowel: Stomach is within normal limits. No evidence of bowel obstruction. Normal appendix (series 2/ image 45). Left colonic diverticulosis, without evidence of diverticulitis. Vascular/Lymphatic: No evidence of abdominal aortic aneurysm. Atherosclerotic calcifications of the abdominal aorta and branch vessels. No suspicious abdominopelvic lymphadenopathy. Reproductive: Mild prostatomegaly. Other: No abdominopelvic ascites. Small fat containing right inguinal hernia (series 2/ image 83). Musculoskeletal: Degenerative changes of the visualized thoracolumbar spine. IMPRESSION: Irregular enhancing mass along the left anterior bladder wall, corresponding to patient's known primary bladder neoplasm. Cholelithiasis, without associated inflammatory changes. No evidence of bowel obstruction.  Normal appendix. No CT findings to account for the patient's abdominal pain. Additional stable ancillary findings as above. Electronically Signed   By: Julian Hy M.D.   On: 10/13/2016 18:55   Dg Abd Acute  W/chest  Result Date: 10/01/2016 CLINICAL DATA:  Patient states that he's had severe abdominal pain since last night, denies vomiting/diarrhea EXAM: DG ABDOMEN ACUTE W/ 1V CHEST COMPARISON:  09/17/2016 previous FINDINGS: Heart size normal.  Atheromatous aorta. Lungs are clear. No effusion. No free air. Normal bowel gas pattern. There are no abnormal calcifications. Facet disease in the lower lumbar spine. IMPRESSION: No acute cardiopulmonary disease. Negative abdominal radiographs. Electronically Signed   By: Lucrezia Europe M.D.   On: 09/24/2016 15:35   US Abdomen Limited Ruq  Result Date: 10/05/2016 CLINICAL DATA:  Abdominal pain 2 days. EXAM: US ABDOMEN LIMITED - RIGHT UPPER QUADRANT COMPARISON:  CT 10/08/2016 and  ultrasound 08/27/2016 FINDINGS: Gallbladder: Cholelithiasis with several stones over the dependent portion of the gallbladder/gallbladder neck. No wall thickening. Negative sonographic Murphy's sign. No adjacent free fluid. Common bile duct: Diameter: 2.9 mm. Liver: No focal lesion identified. Within normal limits in parenchymal echogenicity. IMPRESSION: Cholelithiasis without additional sonographic evidence to suggest cholecystitis. Electronically Signed   By: Marin Olp M.D.   On: 10/05/2016 10:34    Scheduled Meds: . carvedilol  6.25 mg Oral BID WC  . citalopram  20 mg Oral Daily  . dicyclomine  10 mg Oral TID AC & HS  . digoxin  125 mcg Oral Daily  . donepezil  10 mg Oral QHS  . DULoxetine  30 mg Oral Daily  . fenofibrate  160 mg Oral Daily  . furosemide  20 mg Oral Daily  . gabapentin  600 mg Oral TID  . levothyroxine  125 mcg Oral QAC breakfast  . tamsulosin  0.8 mg Oral QPC supper   Continuous Infusions: . sodium chloride 100 mL/hr at 10/06/16 1103     LOS: 0 days    Time spent: > 35 minutes  Velvet Bathe, MD [facility] Pager [phone]  If 7PM-7AM, please contact night-coverage [email or web address] Password [email or web address] 10/06/2016, 3:07 PM

## 2016-10-06 NOTE — Progress Notes (Signed)
ANTICOAGULATION CONSULT NOTE - Initial Consult  Pharmacy Consult for Warfarin Indication: atrial fibrillation  Allergies  Allergen Reactions  . Amiodarone     Neuropathy  . Penicillins Itching    Has patient had a PCN reaction causing immediate rash, facial/tongue/throat swelling, SOB or lightheadedness with hypotension: Yesunknown Has patient had a PCN reaction causing severe rash involving mucus membranes or skin necrosis: unknown Has patient had a PCN reaction that required hospitalization Yesno  If all of the above answers are "NO", then may proceed with Ceph    Patient Measurements: Height: 5\' 6"  (167.6 cm) Weight: 163 lb 9.6 oz (74.2 kg) IBW/kg (Calculated) : 63.8 Heparin Dosing Weight:   Vital Signs: Temp: 98.5 F (36.9 C) (12/24 0544) Temp Source: Oral (12/24 0544) BP: 131/54 (12/24 0544) Pulse Rate: 76 (12/24 0544)  Labs:  Recent Labs  09/19/2016 1725  10/05/16 0642 10/05/16 1210 10/05/16 1452 10/05/16 2252 10/06/16 0659  HGB 15.4  --   --   --   --   --  12.8*  HCT 47.0  --   --   --   --   --  40.2  PLT 301  --   --   --   --   --  272  LABPROT 88.9*  < > >90.0* 82.7*  --  54.7* 34.0*  INR >10.00*  < > >10.00* 9.91*  --  >5.00* 3.27  CREATININE 1.27*  --  0.96  --  1.13  --   --   TROPONINI <0.03  --   --   --   --   --   --   < > = values in this interval not displayed.  Estimated Creatinine Clearance: 40.8 mL/min (by C-G formula based on SCr of 1.13 mg/dL).   Medical History: Past Medical History:  Diagnosis Date  . Atrial fibrillation (HCC)    Persistent, treated with amiodarone and cardioversion  . CAD (coronary artery disease)    Last catheterization in August 2008 demonstrated the LAD 40% stenosis  . Cholecystitis   . COPD (chronic obstructive pulmonary disease) ([place])   . Dilated cardiomyopathy ([place])    Felt to be secondary to atrial fibrillation. EF is about 40%  . Drug therapy    Chronic Coumadin therapy  . Dyslipidemia   .  Hypertension   . Hypothyroidism    Possibly related to amiodarone    Medications:  Prescriptions Prior to Admission  Medication Sig Dispense Refill Last Dose  . acetaminophen (TYLENOL) 500 MG tablet Take 1-2 tablets (500-1,000 mg total) by mouth 2 (two) times daily as needed. (Patient taking differently: Take 500-1,000 mg by mouth 2 (two) times daily as needed for mild pain. ) 30 tablet 0 Past Month at Unknown time  . donepezil (ARICEPT) 10 MG tablet Take 1 tablet (10 mg total) by mouth at bedtime. To help with memory and to help think more clearly 30 tablet 2 10/03/2016 at Unknown time  . warfarin (COUMADIN) 5 MG tablet Take 5 mg once daily except on Monday and Friday take 7.5 mg. (Patient taking differently: Take 5 mg once daily) 32 tablet 2 10/03/2016 at Unknown time  . amLODipine (NORVASC) 10 MG tablet TAKE 1 TABLET DAILY 30 tablet 10 Taking  . carvedilol (COREG) 12.5 MG tablet Take 0.5 tablets (6.25 mg total) by mouth 2 (two) times daily with a meal. 30 tablet 0 Taking  . digoxin (LANOXIN) 0.125 MG tablet Take 1 tablet (125 mcg total) by mouth daily. 30 tablet  2 Taking  . DULoxetine (CYMBALTA) 30 MG capsule Take 1 capsule (30 mg total) by mouth daily. For arthritis pain in the knees 30 capsule 2 Taking  . escitalopram (LEXAPRO) 5 MG tablet Take 5 mg by mouth daily.      . fenofibrate micronized (LOFIBRA) 134 MG capsule TAKE (1) CAPSULE DAILY BEFORE BREAKFAST. 90 capsule 0 Taking  . furosemide (LASIX) 20 MG tablet TAKE (1) TABLET DAILY IN THE MORNING. 30 tablet 5 Taking  . gabapentin (NEURONTIN) 600 MG tablet Take 1 tablet (600 mg total) by mouth 3 (three) times daily. 90 tablet 2 Taking  . HYDROcodone-acetaminophen (NORCO/VICODIN) 5-325 MG tablet Take one every 6-8 hours for pain not helped by tylenol alone 20 tablet 0 Taking  . levothyroxine (SYNTHROID, LEVOTHROID) 125 MCG tablet TAKE 1 TABLET DAILY 30 tablet 10 Taking  . LORazepam (ATIVAN) 0.5 MG tablet Take 1 tablet (0.5 mg total) by  mouth 2 (two) times daily as needed for anxiety. 30 tablet 1 Taking  . NITROSTAT 0.4 MG SL tablet Place 1 tablet (0.4 mg total) under the tongue every 5 (five) minutesas needed for chest pain. 25 tablet 0 Taking  . polyethylene glycol powder (GLYCOLAX/MIRALAX) powder Take 17 g by mouth daily as needed. For constipation 3350 g 5 Taking  . tamsulosin (FLOMAX) 0.4 MG CAPS capsule Take 2 capsules (0.8 mg total) by mouth daily after supper. For urine flow and prostate 60 capsule 3 Taking    Assessment: Continuation of warfarin PTA for AFIB INR elevated on admission and remains elevated. MD order to transfuse FFP 1-2 units if bleeding, no signs of bleeding Vitamin K 10 mg given on admission and 5 mg Saturday INR slightly elevated this AM 3.27  Goal of Therapy:  INR 2-3 Monitor platelets by anticoagulation protocol: Yes   Plan:  No coumadin due to elevated INR IINR/PT daily Monitor for signs of bleeding  Abner Greenspan, Niel Peretti Bennett 10/06/2016,8:28 AM

## 2016-10-07 ENCOUNTER — Inpatient Hospital Stay (HOSPITAL_COMMUNITY): Payer: Medicare Other

## 2016-10-07 DIAGNOSIS — J449 Chronic obstructive pulmonary disease, unspecified: Secondary | ICD-10-CM | POA: Diagnosis present

## 2016-10-07 DIAGNOSIS — N179 Acute kidney failure, unspecified: Secondary | ICD-10-CM | POA: Diagnosis not present

## 2016-10-07 DIAGNOSIS — A419 Sepsis, unspecified organism: Secondary | ICD-10-CM | POA: Diagnosis not present

## 2016-10-07 DIAGNOSIS — R531 Weakness: Secondary | ICD-10-CM | POA: Diagnosis not present

## 2016-10-07 DIAGNOSIS — K8010 Calculus of gallbladder with chronic cholecystitis without obstruction: Secondary | ICD-10-CM | POA: Diagnosis present

## 2016-10-07 DIAGNOSIS — E876 Hypokalemia: Secondary | ICD-10-CM | POA: Diagnosis present

## 2016-10-07 DIAGNOSIS — Z823 Family history of stroke: Secondary | ICD-10-CM | POA: Diagnosis not present

## 2016-10-07 DIAGNOSIS — R0602 Shortness of breath: Secondary | ICD-10-CM | POA: Diagnosis not present

## 2016-10-07 DIAGNOSIS — J90 Pleural effusion, not elsewhere classified: Secondary | ICD-10-CM | POA: Diagnosis not present

## 2016-10-07 DIAGNOSIS — J9811 Atelectasis: Secondary | ICD-10-CM | POA: Diagnosis not present

## 2016-10-07 DIAGNOSIS — D689 Coagulation defect, unspecified: Secondary | ICD-10-CM | POA: Diagnosis present

## 2016-10-07 DIAGNOSIS — G9340 Encephalopathy, unspecified: Secondary | ICD-10-CM | POA: Diagnosis not present

## 2016-10-07 DIAGNOSIS — I2510 Atherosclerotic heart disease of native coronary artery without angina pectoris: Secondary | ICD-10-CM | POA: Diagnosis present

## 2016-10-07 DIAGNOSIS — Z833 Family history of diabetes mellitus: Secondary | ICD-10-CM | POA: Diagnosis not present

## 2016-10-07 DIAGNOSIS — R791 Abnormal coagulation profile: Secondary | ICD-10-CM | POA: Diagnosis not present

## 2016-10-07 DIAGNOSIS — I5043 Acute on chronic combined systolic (congestive) and diastolic (congestive) heart failure: Secondary | ICD-10-CM | POA: Diagnosis not present

## 2016-10-07 DIAGNOSIS — I130 Hypertensive heart and chronic kidney disease with heart failure and stage 1 through stage 4 chronic kidney disease, or unspecified chronic kidney disease: Secondary | ICD-10-CM | POA: Diagnosis present

## 2016-10-07 DIAGNOSIS — Z7901 Long term (current) use of anticoagulants: Secondary | ICD-10-CM | POA: Diagnosis not present

## 2016-10-07 DIAGNOSIS — E039 Hypothyroidism, unspecified: Secondary | ICD-10-CM | POA: Diagnosis present

## 2016-10-07 DIAGNOSIS — I420 Dilated cardiomyopathy: Secondary | ICD-10-CM | POA: Diagnosis present

## 2016-10-07 DIAGNOSIS — J690 Pneumonitis due to inhalation of food and vomit: Secondary | ICD-10-CM | POA: Diagnosis not present

## 2016-10-07 DIAGNOSIS — Z955 Presence of coronary angioplasty implant and graft: Secondary | ICD-10-CM | POA: Diagnosis not present

## 2016-10-07 DIAGNOSIS — E785 Hyperlipidemia, unspecified: Secondary | ICD-10-CM | POA: Diagnosis present

## 2016-10-07 DIAGNOSIS — R29898 Other symptoms and signs involving the musculoskeletal system: Secondary | ICD-10-CM

## 2016-10-07 DIAGNOSIS — I481 Persistent atrial fibrillation: Secondary | ICD-10-CM | POA: Diagnosis present

## 2016-10-07 DIAGNOSIS — F0390 Unspecified dementia without behavioral disturbance: Secondary | ICD-10-CM | POA: Diagnosis present

## 2016-10-07 DIAGNOSIS — Z8551 Personal history of malignant neoplasm of bladder: Secondary | ICD-10-CM | POA: Diagnosis not present

## 2016-10-07 DIAGNOSIS — J9601 Acute respiratory failure with hypoxia: Secondary | ICD-10-CM | POA: Diagnosis not present

## 2016-10-07 DIAGNOSIS — Z8249 Family history of ischemic heart disease and other diseases of the circulatory system: Secondary | ICD-10-CM | POA: Diagnosis not present

## 2016-10-07 DIAGNOSIS — J189 Pneumonia, unspecified organism: Secondary | ICD-10-CM | POA: Diagnosis not present

## 2016-10-07 DIAGNOSIS — I480 Paroxysmal atrial fibrillation: Secondary | ICD-10-CM | POA: Diagnosis present

## 2016-10-07 DIAGNOSIS — G629 Polyneuropathy, unspecified: Secondary | ICD-10-CM | POA: Diagnosis present

## 2016-10-07 LAB — URINE CULTURE: CULTURE: NO GROWTH

## 2016-10-07 LAB — CBC
HEMATOCRIT: 43 % (ref 39.0–52.0)
Hemoglobin: 13.9 g/dL (ref 13.0–17.0)
MCH: 26.2 pg (ref 26.0–34.0)
MCHC: 32.3 g/dL (ref 30.0–36.0)
MCV: 81 fL (ref 78.0–100.0)
PLATELETS: 267 10*3/uL (ref 150–400)
RBC: 5.31 MIL/uL (ref 4.22–5.81)
RDW: 15 % (ref 11.5–15.5)
WBC: 4.8 10*3/uL (ref 4.0–10.5)

## 2016-10-07 LAB — PROTIME-INR
INR: 2
Prothrombin Time: 22.9 seconds — ABNORMAL HIGH (ref 11.4–15.2)

## 2016-10-07 MED ORDER — GABAPENTIN 400 MG PO CAPS
400.0000 mg | ORAL_CAPSULE | Freq: Three times a day (TID) | ORAL | Status: DC
  Administered 2016-10-07 – 2016-10-11 (×12): 400 mg via ORAL
  Filled 2016-10-07 (×13): qty 1

## 2016-10-07 MED ORDER — FENOFIBRATE 54 MG PO TABS
54.0000 mg | ORAL_TABLET | Freq: Every day | ORAL | Status: DC
  Administered 2016-10-08 – 2016-10-11 (×3): 54 mg via ORAL
  Filled 2016-10-07 (×7): qty 1

## 2016-10-07 MED ORDER — WARFARIN SODIUM 5 MG PO TABS
7.5000 mg | ORAL_TABLET | Freq: Once | ORAL | Status: AC
  Administered 2016-10-07: 7.5 mg via ORAL
  Filled 2016-10-07: qty 2

## 2016-10-07 MED ORDER — L-METHYLFOLATE-B6-B12 3-35-2 MG PO TABS
1.0000 | ORAL_TABLET | Freq: Two times a day (BID) | ORAL | Status: DC
  Administered 2016-10-07 – 2016-10-11 (×8): 1 via ORAL
  Filled 2016-10-07 (×15): qty 1

## 2016-10-07 NOTE — Progress Notes (Addendum)
Patient ID: [name], male   DOB: 12/20/1925, 82 y.o.   MRN: [id number]                                                                PROGRESS NOTE                                                                                                                                                                                                             Patient Demographics:    [name], is a 82 y.o. male, DOB - [birth year], [id number]  Admit date - 10/06/2016   Admitting Physician Truett Mainland, DO  Outpatient Primary MD for the patient is Claretta Fraise, MD  LOS - 0  Outpatient Specialists:    Chief Complaint  Patient presents with  . Abdominal Pain       Brief Narrative  PCP: Claretta Fraise, MD  Outpatient Specialists:   Bindubal (Endo)  Hochrein (Cards)  Allyson Sabal (Derm)  Chief Complaint: Abdominal pain  HPI: [name] is a 82 y.o. male with a history of dementia, bladder cancer, persistent atrial fibrillation CHADS Vasc score of 3 on Coumadin, dilated cardiomyopathy, coronary artery disease, COPD, hypertension, hypothyroidism. Patient seen for 2-3 days of persistent abdominal pain that is described as diffuse and nonradiating. Pain is described as dull and cramping. He denies diarrhea, constipation, nausea, vomiting. He does admit to diminished appetite. Denies melena or rectal bleeding. He went and saw his primary care physician earlier today, who examined him and thought he had an acute abdomen and centimeter for further evaluation. Abdominal pain is not relieved by food or liquid. It is not changed with stooling, urination, belching, flatulence. No other palliating or provoking factors.  Emergency Department Course:  Examination emergency department revealed normal white count with CT of his abdomen that shows nonobstructing gallstones with no gallbladder dilation. No other acute findings  Laboratory examination did show an elevated INR that was greater than  10.   Subjective:    [name] today states that his feet are burning.  pt apparently has not been out of bed in while he has been in the hospital .   No headache, No chest pain, No abdominal pain - No Nausea, No new weakness tingling or numbness, No Cough - SOB.    Assessment  &  Plan :    Principal Problem:   Supratherapeutic INR Active Problems:   Hypertensive heart and renal disease with renal failure   Paroxysmal atrial fibrillation (HCC)   Dementia   Cholelithiasis   Abdominal pain   Coagulopathy ([place])   1.  Coagulopathy, improved Coumadin pharmacy to dose  2. Deconditioning. Unable to walk Check CT brain  PT to evaluate and treat  3. Fall risk ? w deconditioning Pt might be a candidate for rehab Attempted to call pt daughter x2, no response, please call tomorrow  4.  ? Dementia Not sure what his baseline is  5.  Pafib Restart coumadin if CT brain negative  6. Neuropathy Decrease gabepentin due to renal insufficiency Start metanx    Code Status : FULL CODE  Family Communication  : attempted to contact daughter x2, left message x2,  No response  Disposition Plan  :   Rehab?  Barriers For Discharge :   Consults  :  PT  Procedures  :   CT abd/pelvis  DVT Prophylaxis  :  SCDs   Lab Results  Component Value Date   PLT 267 10/07/2016    Antibiotics  :  none  Anti-infectives    None        Objective:   Vitals:   10/06/16 0544 10/06/16 1508 10/06/16 2104 10/07/16 0546  BP: (!) 131/54 (!) 141/66 (!) 158/58 (!) 142/77  Pulse: 76 74 82 82  Resp: 20 18 16 19   Temp: 98.5 F (36.9 C) 98.4 F (36.9 C) 97.7 F (36.5 C) 97.9 F (36.6 C)  TempSrc: Oral Oral Oral Oral  SpO2: 92% 90% 90% 91%  Weight:      Height:        Wt Readings from Last 3 Encounters:  10/01/2016 74.2 kg (163 lb 9.6 oz)  09/24/2016 77.6 kg (171 lb)  09/24/16 78 kg (172 lb)     Intake/Output Summary (Last 24 hours) at 10/07/16 1330 Last data filed at 10/07/16 1100   Gross per 24 hour  Intake             4440 ml  Output             3350 ml  Net             1090 ml     Physical Exam  Awake Alert, Oriented X 3, No new F.N deficits, Normal affect [place].AT,PERRAL Supple Neck,No JVD, No cervical lymphadenopathy appriciated.  Symmetrical Chest wall movement, Good air movement bilaterally, CTAB RRR,No Gallops,Rubs or new Murmurs, No Parasternal Heave +ve B.Sounds, Abd Soft, No tenderness, No organomegaly appriciated, No rebound - guarding or rigidity. No Cyanosis, Clubbing or edema, No new Rash or bruise      Data Review:    CBC  Recent Labs Lab 09/29/2016 1725 10/06/16 0659 10/07/16 0620  WBC 6.2 4.4 4.8  HGB 15.4 12.8* 13.9  HCT 47.0 40.2 43.0  PLT 301 272 267  MCV 79.7 81.5 81.0  MCH 26.1 26.0 26.2  MCHC 32.8 31.8 32.3  RDW 15.0 15.4 15.0  LYMPHSABS 1.1  --   --   MONOABS 0.5  --   --   EOSABS 0.2  --   --   BASOSABS 0.0  --   --     Chemistries   Recent Labs Lab 09/30/2016 1725 10/05/16 0642 10/05/16 1452  NA 136 138 137  K 3.4* 3.4* 3.7  CL 100* 104 105  CO2 25 26 27   GLUCOSE  140* 128* 161*  BUN 10 8 9   CREATININE 1.27* 0.96 1.13  CALCIUM 9.4 8.5* 8.4*  AST 22  --   --   ALT 14*  --   --   ALKPHOS 67  --   --   BILITOT 0.7  --   --    ------------------------------------------------------------------------------------------------------------------ No results for input(s): CHOL, HDL, LDLCALC, TRIG, CHOLHDL, LDLDIRECT in the last 72 hours.  Lab Results  Component Value Date   HGBA1C 6.1 07/14/2015   ------------------------------------------------------------------------------------------------------------------ No results for input(s): TSH, T4TOTAL, T3FREE, THYROIDAB in the last 72 hours.  Invalid input(s): FREET3 ------------------------------------------------------------------------------------------------------------------ No results for input(s): VITAMINB12, FOLATE, FERRITIN, TIBC, IRON, RETICCTPCT in the last  72 hours.  Coagulation profile  Recent Labs Lab 10/05/16 0642 10/05/16 1210 10/05/16 2252 10/06/16 0659 10/07/16 0620  INR >10.00* 9.91* >5.00* 3.27 2.00    No results for input(s): DDIMER in the last 72 hours.  Cardiac Enzymes  Recent Labs Lab 09/24/2016 1725  TROPONINI <0.03   ------------------------------------------------------------------------------------------------------------------    Component Value Date/Time   BNP 449.0 (H) 09/17/2016 1248    Inpatient Medications  Scheduled Meds: . carvedilol  6.25 mg Oral BID WC  . citalopram  20 mg Oral Daily  . dicyclomine  10 mg Oral TID AC & HS  . digoxin  125 mcg Oral Daily  . donepezil  10 mg Oral QHS  . DULoxetine  30 mg Oral Daily  . fenofibrate  160 mg Oral Daily  . furosemide  20 mg Oral Daily  . gabapentin  400 mg Oral TID  . l-methylfolate-B6-B12  1 tablet Oral BID  . levothyroxine  125 mcg Oral QAC breakfast  . tamsulosin  0.8 mg Oral QPC supper  . warfarin  7.5 mg Oral Once  . Warfarin - Pharmacist Dosing Inpatient   Does not apply Q24H   Continuous Infusions: . sodium chloride 100 mL/hr at 10/07/16 0551   PRN Meds:.acetaminophen **OR** acetaminophen, LORazepam, traMADol  Micro Results Recent Results (from the past 240 hour(s))  Microscopic Examination     Status: Abnormal   Collection Time: 09/14/2016  3:56 PM  Result Value Ref Range Status   WBC, UA None seen 0 - 5 /hpf Final   RBC, UA 3-10 (A) 0 - 2 /hpf Final   Epithelial Cells (non renal) 0-10 0 - 10 /hpf Final   Renal Epithel, UA 0-10 (A) None seen /hpf Final   Casts Present None seen /lpf Final   Cast Type Hyaline casts N/A Final   Mucus, UA Present Not Estab. Final   Bacteria, UA None seen None seen/Few Final  Urine culture     Status: None   Collection Time: 09/18/2016  6:54 PM  Result Value Ref Range Status   Specimen Description URINE, RANDOM  Final   Special Requests NONE  Final   Culture NO GROWTH Performed at [facility]   Final   Report Status 10/07/2016 FINAL  Final    Radiology Reports Dg Chest 2 View  Result Date: 09/17/2016 CLINICAL DATA:  Shortness of breath since last night EXAM: CHEST  2 VIEW COMPARISON:  September 06, 2016 FINDINGS: The heart size and mediastinal contours are stable. The heart size is upper limits normal. There is no focal infiltrate, pulmonary edema, or pleural effusion. Mild central pulmonary vascular congestion is identified. Degenerative joint changes of the spine are noted. IMPRESSION: Mild central pulmonary vascular congestion without frank edema. Electronically Signed   By: Mallie Darting.D.  On: 09/17/2016 12:49   Ct Abdomen Pelvis W Contrast  Result Date: 09/14/2016 CLINICAL DATA:  Severe abdominal pain, history of bladder cancer EXAM: CT ABDOMEN AND PELVIS WITH CONTRAST TECHNIQUE: Multidetector CT imaging of the abdomen and pelvis was performed using the standard protocol following bolus administration of intravenous contrast. CONTRAST:  100 mL Isovue 300 IV COMPARISON:  09/03/2016 FINDINGS: Lower chest: Mild subpleural reticulation at the lung bases. Hepatobiliary: The liver is within normal limits. Gallbladder is notable for a 14 mm layering gallstone (series 2/image 28). No associated inflammatory changes. Pancreas: Within normal limits. Spleen: Within normal limits. Adrenals/Urinary Tract: Adrenal glands are within normal limits. 1.5 cm lateral left upper pole renal cyst. Right kidney is within normal limits. No hydronephrosis. 1.5 x 3.4 cm irregular enhancing mass along the left anterior bladder wall (series 2/ image 81) with additional underlying irregular wall thickening (series 2/ image 79), corresponding to the patient's known primary bladder neoplasm. Stomach/Bowel: Stomach is within normal limits. No evidence of bowel obstruction. Normal appendix (series 2/ image 45). Left colonic diverticulosis, without evidence of diverticulitis. Vascular/Lymphatic: No evidence  of abdominal aortic aneurysm. Atherosclerotic calcifications of the abdominal aorta and branch vessels. No suspicious abdominopelvic lymphadenopathy. Reproductive: Mild prostatomegaly. Other: No abdominopelvic ascites. Small fat containing right inguinal hernia (series 2/ image 83). Musculoskeletal: Degenerative changes of the visualized thoracolumbar spine. IMPRESSION: Irregular enhancing mass along the left anterior bladder wall, corresponding to patient's known primary bladder neoplasm. Cholelithiasis, without associated inflammatory changes. No evidence of bowel obstruction.  Normal appendix. No CT findings to account for the patient's abdominal pain. Additional stable ancillary findings as above. Electronically Signed   By: Julian Hy M.D.   On: 09/28/2016 18:55   Dg Knee Complete 4 Views Left  Result Date: 09/17/2016 CLINICAL DATA:  Generalized pain and swelling after falling down steps yesterday. EXAM: LEFT KNEE - COMPLETE 4+ VIEW COMPARISON:  Multiple radiographic and MRI exam same year FINDINGS: There is a large knee joint effusion. No acute fracture. No way compartment joint space narrowing or patellofemoral osteoarthritis. Calcification in Hoffa's fat pad could be calcified fat necrosis or a chondroma as noted at MRI. IMPRESSION: No acute finding. Joint effusion which has been present on previous studies and felt secondary to synovitis. Benign calcification in Hoffa's fat pad that could be fat necrosis or a chondroma. Electronically Signed   By: Nelson Chimes M.D.   On: 09/17/2016 11:41   Dg Knee Complete 4 Views Right  Result Date: 09/17/2016 CLINICAL DATA:  [address] down stairs landing on the knee EXAM: RIGHT KNEE - COMPLETE 4+ VIEW COMPARISON:  03/04/2016 FINDINGS: Small knee joint effusion. No osteoarthritic joint space narrowing. No fracture. Regional vascular calcification incidentally noted. IMPRESSION: Small joint effusion.  Otherwise negative. Electronically Signed   By: Nelson Chimes  M.D.   On: 09/17/2016 11:42   Dg Abd Acute W/chest  Result Date: 10/09/2016 CLINICAL DATA:  Patient states that he's had severe abdominal pain since last night, denies vomiting/diarrhea EXAM: DG ABDOMEN ACUTE W/ 1V CHEST COMPARISON:  09/17/2016 previous FINDINGS: Heart size normal.  Atheromatous aorta. Lungs are clear. No effusion. No free air. Normal bowel gas pattern. There are no abnormal calcifications. Facet disease in the lower lumbar spine. IMPRESSION: No acute cardiopulmonary disease. Negative abdominal radiographs. Electronically Signed   By: Lucrezia Europe M.D.   On: 09/17/2016 15:35   US Abdomen Limited Ruq  Result Date: 10/05/2016 CLINICAL DATA:  Abdominal pain 2 days. EXAM: US ABDOMEN LIMITED - RIGHT UPPER  QUADRANT COMPARISON:  CT 10/13/2016 and ultrasound 08/27/2016 FINDINGS: Gallbladder: Cholelithiasis with several stones over the dependent portion of the gallbladder/gallbladder neck. No wall thickening. Negative sonographic Murphy's sign. No adjacent free fluid. Common bile duct: Diameter: 2.9 mm. Liver: No focal lesion identified. Within normal limits in parenchymal echogenicity. IMPRESSION: Cholelithiasis without additional sonographic evidence to suggest cholecystitis. Electronically Signed   By: Marin Olp M.D.   On: 10/05/2016 10:34    Time Spent in minutes  30   Jani Gravel M.D on 10/07/2016 at 1:30 PM  Between 7am to 7pm - Pager - [phone]  After 7pm go to [email or web address] - password [facility]  [facility] -  Office  [phone]

## 2016-10-07 NOTE — Progress Notes (Signed)
[place] for Warfarin Indication: atrial fibrillation  Allergies  Allergen Reactions  . Amiodarone     Neuropathy  . Penicillins Itching    Has patient had a PCN reaction causing immediate rash, facial/tongue/throat swelling, SOB or lightheadedness with hypotension: Yesunknown Has patient had a PCN reaction causing severe rash involving mucus membranes or skin necrosis: unknown Has patient had a PCN reaction that required hospitalization Yesno  If all of the above answers are "NO", then may proceed with Ceph    Patient Measurements: Height: 5\' 6"  (167.6 cm) Weight: 163 lb 9.6 oz (74.2 kg) IBW/kg (Calculated) : 63.8 Heparin Dosing Weight:   Vital Signs: Temp: 97.9 F (36.6 C) (12/25 0546) Temp Source: Oral (12/25 0546) BP: 142/77 (12/25 0546) Pulse Rate: 82 (12/25 0546)  Labs:  Recent Labs  09/27/2016 1725  10/05/16 [id number]  10/05/16 1452 10/05/16 2252 10/06/16 0659 10/07/16 0620  HGB 15.4  --   --   --   --   --  12.8* 13.9  HCT 47.0  --   --   --   --   --  40.2 43.0  PLT 301  --   --   --   --   --  272 267  LABPROT 88.9*  < > >90.0*  < >  --  54.7* 34.0* 22.9*  INR >10.00*  < > >10.00*  < >  --  >5.00* 3.27 2.00  CREATININE 1.27*  --  0.96  --  1.13  --   --   --   TROPONINI <0.03  --   --   --   --   --   --   --   < > = values in this interval not displayed.  Estimated Creatinine Clearance: 40.8 mL/min (by C-G formula based on SCr of 1.13 mg/dL).   Medical History: Past Medical History:  Diagnosis Date  . Atrial fibrillation (HCC)    Persistent, treated with amiodarone and cardioversion  . CAD (coronary artery disease)    Last catheterization in August 2008 demonstrated the LAD 40% stenosis  . Cholecystitis   . COPD (chronic obstructive pulmonary disease) ([place])   . Dilated cardiomyopathy ([place])    Felt to be secondary to atrial fibrillation. EF is about 40%  . Drug therapy    Chronic Coumadin therapy  .  Dyslipidemia   . Hypertension   . Hypothyroidism    Possibly related to amiodarone    Medications:  Prescriptions Prior to Admission  Medication Sig Dispense Refill Last Dose  . acetaminophen (TYLENOL) 500 MG tablet Take 1-2 tablets (500-1,000 mg total) by mouth 2 (two) times daily as needed. (Patient taking differently: Take 500-1,000 mg by mouth 2 (two) times daily as needed for mild pain. ) 30 tablet 0 Past Month at Unknown time  . donepezil (ARICEPT) 10 MG tablet Take 1 tablet (10 mg total) by mouth at bedtime. To help with memory and to help think more clearly 30 tablet 2 10/03/2016 at Unknown time  . warfarin (COUMADIN) 5 MG tablet Take 5 mg once daily except on Monday and Friday take 7.5 mg. (Patient taking differently: Take 5 mg once daily) 32 tablet 2 10/03/2016 at Unknown time  . amLODipine (NORVASC) 10 MG tablet TAKE 1 TABLET DAILY 30 tablet 10 Taking  . carvedilol (COREG) 12.5 MG tablet Take 0.5 tablets (6.25 mg total) by mouth 2 (two) times daily with a meal. 30 tablet 0 Taking  . digoxin (LANOXIN)  0.125 MG tablet Take 1 tablet (125 mcg total) by mouth daily. 30 tablet 2 Taking  . DULoxetine (CYMBALTA) 30 MG capsule Take 1 capsule (30 mg total) by mouth daily. For arthritis pain in the knees 30 capsule 2 Taking  . escitalopram (LEXAPRO) 5 MG tablet Take 5 mg by mouth daily.      . fenofibrate micronized (LOFIBRA) 134 MG capsule TAKE (1) CAPSULE DAILY BEFORE BREAKFAST. 90 capsule 0 Taking  . furosemide (LASIX) 20 MG tablet TAKE (1) TABLET DAILY IN THE MORNING. 30 tablet 5 Taking  . gabapentin (NEURONTIN) 600 MG tablet Take 1 tablet (600 mg total) by mouth 3 (three) times daily. 90 tablet 2 Taking  . HYDROcodone-acetaminophen (NORCO/VICODIN) 5-325 MG tablet Take one every 6-8 hours for pain not helped by tylenol alone 20 tablet 0 Taking  . levothyroxine (SYNTHROID, LEVOTHROID) 125 MCG tablet TAKE 1 TABLET DAILY 30 tablet 10 Taking  . LORazepam (ATIVAN) 0.5 MG tablet Take 1 tablet  (0.5 mg total) by mouth 2 (two) times daily as needed for anxiety. 30 tablet 1 Taking  . NITROSTAT 0.4 MG SL tablet Place 1 tablet (0.4 mg total) under the tongue every 5 (five) minutesas needed for chest pain. 25 tablet 0 Taking  . polyethylene glycol powder (GLYCOLAX/MIRALAX) powder Take 17 g by mouth daily as needed. For constipation 3350 g 5 Taking  . tamsulosin (FLOMAX) 0.4 MG CAPS capsule Take 2 capsules (0.8 mg total) by mouth daily after supper. For urine flow and prostate 60 capsule 3 Taking    Assessment: Continuation of warfarin PTA for AFIB INR now 2.0 after administration of Vit K No bleeding noted  Goal of Therapy:  INR 2-3 Monitor platelets by anticoagulation protocol: Yes   Plan:  Coumadin 7.5 mg po today INR/PT daily Monitor for signs of bleeding  Melisa Donofrio Poteet 10/07/2016,8:41 AM

## 2016-10-08 LAB — PROTIME-INR
INR: 1.73
Prothrombin Time: 20.4 seconds — ABNORMAL HIGH (ref 11.4–15.2)

## 2016-10-08 LAB — COMPREHENSIVE METABOLIC PANEL
ALBUMIN: 3.3 g/dL — AB (ref 3.5–5.0)
ALK PHOS: 57 U/L (ref 38–126)
ALT: 10 U/L — ABNORMAL LOW (ref 17–63)
ANION GAP: 8 (ref 5–15)
AST: 13 U/L — ABNORMAL LOW (ref 15–41)
BILIRUBIN TOTAL: 0.6 mg/dL (ref 0.3–1.2)
BUN: 5 mg/dL — AB (ref 6–20)
CALCIUM: 8.3 mg/dL — AB (ref 8.9–10.3)
CO2: 26 mmol/L (ref 22–32)
Chloride: 97 mmol/L — ABNORMAL LOW (ref 101–111)
Creatinine, Ser: 0.74 mg/dL (ref 0.61–1.24)
GFR calc Af Amer: >60 mL/min (ref >60)
GLUCOSE: 143 mg/dL — AB (ref 65–99)
POTASSIUM: 3 mmol/L — AB (ref 3.5–5.1)
Sodium: 131 mmol/L — ABNORMAL LOW (ref 135–145)
Total Protein: 6.2 g/dL — ABNORMAL LOW (ref 6.5–8.1)

## 2016-10-08 LAB — CBC
HEMATOCRIT: 43.5 % (ref 39.0–52.0)
HEMOGLOBIN: 14.3 g/dL (ref 13.0–17.0)
MCH: 26.1 pg (ref 26.0–34.0)
MCHC: 32.9 g/dL (ref 30.0–36.0)
MCV: 79.4 fL (ref 78.0–100.0)
Platelets: 253 10*3/uL (ref 150–400)
RBC: 5.48 MIL/uL (ref 4.22–5.81)
RDW: 14.7 % (ref 11.5–15.5)
WBC: 5.5 10*3/uL (ref 4.0–10.5)

## 2016-10-08 MED ORDER — POTASSIUM CHLORIDE CRYS ER 20 MEQ PO TBCR
40.0000 meq | EXTENDED_RELEASE_TABLET | Freq: Once | ORAL | Status: AC
  Administered 2016-10-08: 40 meq via ORAL
  Filled 2016-10-08: qty 2

## 2016-10-08 MED ORDER — AMLODIPINE BESYLATE 5 MG PO TABS
5.0000 mg | ORAL_TABLET | Freq: Every day | ORAL | Status: DC
  Administered 2016-10-08 – 2016-10-11 (×3): 5 mg via ORAL
  Filled 2016-10-08 (×4): qty 1

## 2016-10-08 MED ORDER — HALOPERIDOL LACTATE 5 MG/ML IJ SOLN
1.0000 mg | Freq: Once | INTRAMUSCULAR | Status: AC | PRN
  Filled 2016-10-08: qty 1

## 2016-10-08 MED ORDER — FUROSEMIDE 10 MG/ML IJ SOLN
40.0000 mg | Freq: Once | INTRAMUSCULAR | Status: AC
  Administered 2016-10-08: 40 mg via INTRAVENOUS
  Filled 2016-10-08: qty 4

## 2016-10-08 MED ORDER — WARFARIN SODIUM 5 MG PO TABS
5.0000 mg | ORAL_TABLET | Freq: Once | ORAL | Status: AC
  Administered 2016-10-08: 5 mg via ORAL
  Filled 2016-10-08: qty 1

## 2016-10-08 MED ORDER — LEVALBUTEROL HCL 0.63 MG/3ML IN NEBU
0.6300 mg | INHALATION_SOLUTION | Freq: Four times a day (QID) | RESPIRATORY_TRACT | Status: DC | PRN
  Administered 2016-10-08 – 2016-10-12 (×3): 0.63 mg via RESPIRATORY_TRACT
  Filled 2016-10-08 (×4): qty 3

## 2016-10-08 MED ORDER — HALOPERIDOL 2 MG PO TABS
1.0000 mg | ORAL_TABLET | Freq: Once | ORAL | Status: AC | PRN
  Administered 2016-10-08: 1 mg via ORAL
  Filled 2016-10-08: qty 1

## 2016-10-08 NOTE — Progress Notes (Signed)
Pt has audible wheezes.  Oxygen saturation in the high 80s.  Upped O2 to 3L and notified on call mid level for breathing treatments.  Will continue to monitor.

## 2016-10-08 NOTE — Progress Notes (Signed)
Entered pt's room at 1600 to find him anxious, SOB with audible wheezing. O2 sats 84%. Applied oxygen at 3L and paged Dr. Wendee Beavers. Orders received. Lasix and haldol given per orders. At this time pt has had 760ml output. O2 sats 89%. Pt is less anxious but continues to have audible wheezing. Daughter at bedside. Will continue to monitor.

## 2016-10-08 NOTE — Care Management Note (Signed)
Case Management Note  Patient Details  Name: [name] MRN: [id number] Date of Birth: [birth year]  Subjective/Objective:   Patient with C/o abd pain, CT negative, adm with diagnosis of subtherapeutic INR. He lives alone and does not have supervision. PT recommended HH PT only because patient said he wanted to go home but clinical impression by PT is for SNF. Spoke with patient and he wants to talk with his family regarding SNF. I spoke with daughter, Chong Sicilian, who feels patient needs rehab, she will encourage patient to agree. CM will follow up and if needed refer to SW for placement.                Action/Plan: CM following, will talk with patient again.    Expected Discharge Date:        10/09/2016          Expected Discharge Plan:  Skilled Nursing Facility  In-House Referral:  Clinical Social Work  Discharge planning Services  CM Consult  Post Acute Care Choice:    Choice offered to:     DME Arranged:    DME Agency:     HH Arranged:    [place] Agency:     Status of Service:  In process, will continue to follow  If discussed at Long Length of Stay Meetings, dates discussed:    Additional Comments:  Leisl Spurrier, Chauncey Reading, RN 10/08/2016, 12:23 PM

## 2016-10-08 NOTE — Progress Notes (Signed)
PROGRESS NOTE    Ruffin Berzins  [id number] DOB: 27-Jan-1926 DOA: 09/18/2016 PCP: Claretta Fraise, MD   Brief Narrative:  82 y.o. male with a history of dementia, bladder cancer, persistent atrial fibrillation CHADS Vasc score of 3 on Coumadin, dilated cardiomyopathy, coronary artery disease, COPD, hypertension, hypothyroidism. Patient seen for 2-3 days of persistent abdominal pain that is described as diffuse and nonradiating.  Assessment & Plan:   Principal Problem:   Supratherapeutic INR - INR trending down. Coumadin per pharmacy consult. Currently below goal and we want to discharge patient on goal of 2-3 to protect against stroke. Will need to reassess INR level next am.  Active Problems:   Hypertensive heart and renal disease with renal failure - Elevated and not well controlled will place back on amlodipine.  Hypokalemia -Mild will replace and reassess next a.m.    Paroxysmal atrial fibrillation (HCC) - Rate controlled currently, continue carvedilol    Dementia  - Stable continue home medication regimen    Cholelithiasis - Patient not complaining of any abdominal discomfort currently. Recommend outpatient evaluation. Tolerating diet.    Abdominal pain - Treat supportively secondary to cholecystitis/cholelithiasis  DVT prophylaxis: supratherapeutic INR Code Status: Full Family Communication: Discussed directly with patient Disposition Plan: Pending improvement in INR levels   Consultants:   None  Procedures: None   Antimicrobials: None  Subjective: Pt reports no new complaints to me.  Objective: Vitals:   10/07/16 2139 10/07/16 2250 10/08/16 0509 10/08/16 1440  BP: (!) 160/91 (!) 144/77 (!) 153/65 (!) 178/70  Pulse: 81  79 100  Resp: 16  16 20   Temp: 98 F (36.7 C)  97.6 F (36.4 C) 97.4 F (36.3 C)  TempSrc: Oral  Oral Oral  SpO2: 90% 93% 96% 92%  Weight:      Height:        Intake/Output Summary (Last 24 hours) at 10/08/16 1543 Last data  filed at 10/08/16 1442  Gross per 24 hour  Intake             1700 ml  Output             1900 ml  Net             -200 ml   Filed Weights   10/10/2016 1717 09/18/2016 2030  Weight: 74.8 kg (165 lb) 74.2 kg (163 lb 9.6 oz)    Examination:  General exam: Appears calm and comfortable, in nad. Respiratory system: Clear to auscultation. Respiratory effort normal. Cardiovascular system: IRR. No JVD, murmurs, rubs, gallops or clicks.  Gastrointestinal system: Abdomen is nondistended, soft and no guarding.  No organomegaly or masses felt. Normal bowel sounds heard. Central nervous system: Alert and awake. No focal neurological deficits. Extremities: Symmetric 5 x 5 power. Skin: No rashes, lesions or ulcers, on limited exam. Psychiatry: difficult assessment due to dementia. Mood & affect appropriate.   Data Reviewed: I have personally reviewed following labs and imaging studies  CBC:  Recent Labs Lab 09/16/2016 1725 10/06/16 0659 10/07/16 0620 10/08/16 0601  WBC 6.2 4.4 4.8 5.5  NEUTROABS 4.3  --   --   --   HGB 15.4 12.8* 13.9 14.3  HCT 47.0 40.2 43.0 43.5  MCV 79.7 81.5 81.0 79.4  PLT 301 272 267 [id number]   Basic Metabolic Panel:  Recent Labs Lab 09/29/2016 1725 10/05/16 0642 10/05/16 1452 10/08/16 0601  NA 136 138 137 131*  K 3.4* 3.4* 3.7 3.0*  CL 100* 104 105 97*  CO2 25 26  27 26  GLUCOSE 140* 128* 161* 143*  BUN 10 8 9  5*  CREATININE 1.27* 0.96 1.13 0.74  CALCIUM 9.4 8.5* 8.4* 8.3*   GFR: Estimated Creatinine Clearance: 57.6 mL/min (by C-G formula based on SCr of 0.74 mg/dL). Liver Function Tests:  Recent Labs Lab 09/15/2016 1725 10/08/16 0601  AST 22 13*  ALT 14* 10*  ALKPHOS 67 57  BILITOT 0.7 0.6  PROT 7.7 6.2*  ALBUMIN 4.2 3.3*    Recent Labs Lab 10/03/2016 1725  LIPASE 19   No results for input(s): AMMONIA in the last 168 hours. Coagulation Profile:  Recent Labs Lab 10/05/16 1210 10/05/16 2252 10/06/16 0659 10/07/16 0620 10/08/16 0601  INR  9.91* >5.00* 3.27 2.00 1.73   Cardiac Enzymes:  Recent Labs Lab 10/01/2016 1725  TROPONINI <0.03   BNP (last 3 results) No results for input(s): PROBNP in the last 8760 hours. HbA1C: No results for input(s): HGBA1C in the last 72 hours. CBG: No results for input(s): GLUCAP in the last 168 hours. Lipid Profile: No results for input(s): CHOL, HDL, LDLCALC, TRIG, CHOLHDL, LDLDIRECT in the last 72 hours. Thyroid Function Tests: No results for input(s): TSH, T4TOTAL, FREET4, T3FREE, THYROIDAB in the last 72 hours. Anemia Panel: No results for input(s): VITAMINB12, FOLATE, FERRITIN, TIBC, IRON, RETICCTPCT in the last 72 hours. Sepsis Labs:  Recent Labs Lab 10/07/2016 1906  LATICACIDVEN 1.0    Recent Results (from the past 240 hour(s))  Microscopic Examination     Status: Abnormal   Collection Time: 10/05/2016  3:56 PM  Result Value Ref Range Status   WBC, UA None seen 0 - 5 /hpf Final   RBC, UA 3-10 (A) 0 - 2 /hpf Final   Epithelial Cells (non renal) 0-10 0 - 10 /hpf Final   Renal Epithel, UA 0-10 (A) None seen /hpf Final   Casts Present None seen /lpf Final   Cast Type Hyaline casts N/A Final   Mucus, UA Present Not Estab. Final   Bacteria, UA None seen None seen/Few Final  Urine culture     Status: None   Collection Time: 09/29/2016  6:54 PM  Result Value Ref Range Status   Specimen Description URINE, RANDOM  Final   Special Requests NONE  Final   Culture NO GROWTH Performed at [facility]   Final   Report Status 10/07/2016 FINAL  Final    Radiology Studies: Ct Head Wo Contrast  Result Date: 10/07/2016 CLINICAL DATA:  82 year old hypertensive male with dilated cardiomyopathy presenting with bilateral leg weakness and unable to walk since 09/26/2016. Initial encounter. EXAM: CT HEAD WITHOUT CONTRAST TECHNIQUE: Contiguous axial images were obtained from the base of the skull through the vertex without intravenous contrast. COMPARISON:  01/28/2016 and 10/05/2013 head  CT. FINDINGS: Brain: No intracranial hemorrhage or CT evidence of large acute infarct. 6 mm colloid cyst unchanged from 2014. Moderate atrophy without hydrocephalus. Chronic microvascular changes. Vascular: Vascular calcifications with ectasia. Slightly hyperdense appearance middle cerebral artery branch vessels more notable on the left unchanged. Skull: No skull fracture. Sinuses/Orbits: No acute orbital abnormality. Partial opacification right sphenoid sinus. Opacification inferior aspect right mastoid air cells without obstructing lesion of eustachian tube noted. Other: Negative. IMPRESSION: No intracranial hemorrhage or CT evidence of large acute infarct. 6 mm colloid cyst unchanged from 2014. Atrophy without hydrocephalus. Chronic microvascular changes. Partial opacification right sphenoid sinus. Opacification inferior aspect right mastoid air cells. Electronically Signed   By: Genia Del M.D.   On: 10/07/2016 14:28  Scheduled Meds: . carvedilol  6.25 mg Oral BID WC  . citalopram  20 mg Oral Daily  . dicyclomine  10 mg Oral TID AC & HS  . digoxin  125 mcg Oral Daily  . donepezil  10 mg Oral QHS  . DULoxetine  30 mg Oral Daily  . fenofibrate  54 mg Oral Daily  . furosemide  20 mg Oral Daily  . gabapentin  400 mg Oral TID  . l-methylfolate-B6-B12  1 tablet Oral BID  . levothyroxine  125 mcg Oral QAC breakfast  . tamsulosin  0.8 mg Oral QPC supper  . warfarin  5 mg Oral Once  . Warfarin - Pharmacist Dosing Inpatient   Does not apply Q24H   Continuous Infusions: . sodium chloride 100 mL/hr at 10/08/16 0824     LOS: 1 day    Time spent: > 35 minutes  Velvet Bathe, MD [facility] Pager [phone]  If 7PM-7AM, please contact night-coverage [email or web address] Password [facility] 10/08/2016, 3:43 PM

## 2016-10-08 NOTE — Progress Notes (Signed)
[place] for Warfarin Indication: atrial fibrillation  Allergies  Allergen Reactions  . Amiodarone     Neuropathy  . Penicillins Itching    Has patient had a PCN reaction causing immediate rash, facial/tongue/throat swelling, SOB or lightheadedness with hypotension: Yesunknown Has patient had a PCN reaction causing severe rash involving mucus membranes or skin necrosis: unknown Has patient had a PCN reaction that required hospitalization Yesno  If all of the above answers are "NO", then may proceed with Ceph    Patient Measurements: Height: 5\' 6"  (167.6 cm) Weight: 163 lb 9.6 oz (74.2 kg) IBW/kg (Calculated) : 63.8 Heparin Dosing Weight:   Vital Signs: Temp: 97.6 F (36.4 C) (12/26 0509) Temp Source: Oral (12/26 0509) BP: 153/65 (12/26 0509) Pulse Rate: 79 (12/26 0509)  Labs:  Recent Labs  10/05/16 1452  10/06/16 0659 10/07/16 0620 10/08/16 0601  HGB  --   < > 12.8* 13.9 14.3  HCT  --   --  40.2 43.0 43.5  PLT  --   --  272 267 253  LABPROT  --   < > 34.0* 22.9* 20.4*  INR  --   < > 3.27 2.00 1.73  CREATININE 1.13  --   --   --  0.74  < > = values in this interval not displayed.  Estimated Creatinine Clearance: 57.6 mL/min (by C-G formula based on SCr of 0.74 mg/dL).   Medical History: Past Medical History:  Diagnosis Date  . Atrial fibrillation (HCC)    Persistent, treated with amiodarone and cardioversion  . CAD (coronary artery disease)    Last catheterization in August 2008 demonstrated the LAD 40% stenosis  . Cholecystitis   . COPD (chronic obstructive pulmonary disease) ([place])   . Dilated cardiomyopathy ([place])    Felt to be secondary to atrial fibrillation. EF is about 40%  . Drug therapy    Chronic Coumadin therapy  . Dyslipidemia   . Hypertension   . Hypothyroidism    Possibly related to amiodarone    Medications:  Prescriptions Prior to Admission  Medication Sig Dispense Refill Last Dose  .  acetaminophen (TYLENOL) 500 MG tablet Take 1-2 tablets (500-1,000 mg total) by mouth 2 (two) times daily as needed. (Patient taking differently: Take 500-1,000 mg by mouth 2 (two) times daily as needed for mild pain. ) 30 tablet 0 Past Month at Unknown time  . donepezil (ARICEPT) 10 MG tablet Take 1 tablet (10 mg total) by mouth at bedtime. To help with memory and to help think more clearly 30 tablet 2 10/03/2016 at Unknown time  . warfarin (COUMADIN) 5 MG tablet Take 5 mg once daily except on Monday and Friday take 7.5 mg. (Patient taking differently: Take 5 mg once daily) 32 tablet 2 10/03/2016 at Unknown time  . amLODipine (NORVASC) 10 MG tablet TAKE 1 TABLET DAILY 30 tablet 10 Taking  . carvedilol (COREG) 12.5 MG tablet Take 0.5 tablets (6.25 mg total) by mouth 2 (two) times daily with a meal. 30 tablet 0 Taking  . digoxin (LANOXIN) 0.125 MG tablet Take 1 tablet (125 mcg total) by mouth daily. 30 tablet 2 Taking  . DULoxetine (CYMBALTA) 30 MG capsule Take 1 capsule (30 mg total) by mouth daily. For arthritis pain in the knees 30 capsule 2 Taking  . escitalopram (LEXAPRO) 5 MG tablet Take 5 mg by mouth daily.      . fenofibrate micronized (LOFIBRA) 134 MG capsule TAKE (1) CAPSULE DAILY BEFORE BREAKFAST.  90 capsule 0 Taking  . furosemide (LASIX) 20 MG tablet TAKE (1) TABLET DAILY IN THE MORNING. 30 tablet 5 Taking  . gabapentin (NEURONTIN) 600 MG tablet Take 1 tablet (600 mg total) by mouth 3 (three) times daily. 90 tablet 2 Taking  . HYDROcodone-acetaminophen (NORCO/VICODIN) 5-325 MG tablet Take one every 6-8 hours for pain not helped by tylenol alone 20 tablet 0 Taking  . levothyroxine (SYNTHROID, LEVOTHROID) 125 MCG tablet TAKE 1 TABLET DAILY 30 tablet 10 Taking  . LORazepam (ATIVAN) 0.5 MG tablet Take 1 tablet (0.5 mg total) by mouth 2 (two) times daily as needed for anxiety. 30 tablet 1 Taking  . NITROSTAT 0.4 MG SL tablet Place 1 tablet (0.4 mg total) under the tongue every 5 (five) minutesas  needed for chest pain. 25 tablet 0 Taking  . polyethylene glycol powder (GLYCOLAX/MIRALAX) powder Take 17 g by mouth daily as needed. For constipation 3350 g 5 Taking  . tamsulosin (FLOMAX) 0.4 MG CAPS capsule Take 2 capsules (0.8 mg total) by mouth daily after supper. For urine flow and prostate 60 capsule 3 Taking    Assessment: Continuation of warfarin PTA for AFIB INR now 1.73 after administration of Vit K No bleeding noted  Goal of Therapy:  INR 2-3 Monitor platelets by anticoagulation protocol: Yes   Plan:  Coumadin 5 mg po today INR/PT daily Monitor for signs of bleeding  Jerene Yeager Poteet 10/08/2016,9:23 AM

## 2016-10-08 NOTE — Evaluation (Signed)
Physical Therapy Evaluation Patient Details Name: [name] MRN: [id number] DOB: Sep 10, 1926 Today's Date: 10/08/2016   History of Present Illness  HPI: [name] is a 82 y.o. male with a history of dementia, bladder cancer, persistent atrial fibrillation CHADS Vasc score of 3 on Coumadin, dilated cardiomyopathy, coronary artery disease, COPD, hypertension, hypothyroidism. Patient seen for 2-3 days of persistent abdominal pain that is described as diffuse and nonradiating. Pain is described as dull and cramping. He denies diarrhea, constipation, nausea, vomiting. He does admit to diminished appetite. Denies melena or rectal bleeding. He went and saw his primary care physician earlier today, who examined him and thought he had an acute abdomen and centimeter for further evaluation. Abdominal pain is not relieved by food or liquid. It is not changed with stooling, urination, belching, flatulence. No other palliating or provoking factors.  Clinical Impression  Pt is an 82 yo male with decreased ankle ROM, decreased LE strength B and decreased mobility.  He will benefit from skilled physical therapy to improve his mobility and decrease his fall risk.  Therapist recommends SNF but pt is against this idea stating that , "I will be okay".  Recommend continued encouragement to go to SNF for a few weeks..    Follow Up Recommendations Home health PT    Equipment Recommendations  Rolling walker with 5" wheels    Recommendations for Other Services       Precautions / Restrictions Precautions Precautions: Fall Restrictions Weight Bearing Restrictions: No      Mobility  Bed Mobility Overal bed mobility: Needs Assistance Bed Mobility: Supine to Sit     Supine to sit: Mod assist        Transfers Overall transfer level: Needs assistance   Transfers: Sit to/from Stand Sit to Stand: Min assist            Ambulation/Gait Ambulation/Gait assistance: Min guard Ambulation Distance  (Feet): 5 Feet Assistive device: Rolling walker (2 wheeled) Gait Pattern/deviations: Decreased step length - left;Decreased step length - right;Trunk flexed   Gait velocity interpretation: Below normal speed for age/gender    Stairs            Wheelchair Mobility    Modified Rankin (Stroke Patients Only)       Balance                                             Pertinent Vitals/Pain Pain Assessment: 0-10 Pain Score: 5  Pain Location: PT states that he is hurting all over.  States that he is unable to breath/     Home Living Family/patient expects to be discharged to:: Private residence Living Arrangements: Alone Available Help at Discharge: Family Type of Home: House Home Access: Stairs to enter Entrance Stairs-Rails: Right Entrance Stairs-Number of Steps: 4 Home Layout: One level [place] - single point      Prior Function Level of Independence: Independent               Hand Dominance        Extremity/Trunk Assessment                Communication   Communication: No difficulties  Cognition Arousal/Alertness: Awake/alert Behavior During Therapy: WFL for tasks assessed/performed Overall Cognitive Status: Within Functional Limits for tasks assessed  General Comments: Pt refuses to get up today due to hurting and SOB at rest .    General Comments      Exercises General Exercises - Lower Extremity Ankle Circles/Pumps: Both;10 reps Heel Slides: Both;10 reps Hip ABduction/ADduction: Both;10 reps   Assessment/Plan    PT Assessment Patient needs continued PT services  PT Problem List Decreased strength;Decreased range of motion;Decreased activity tolerance;Decreased balance;Decreased mobility;Pain;Decreased safety awareness          PT Treatment Interventions Gait training;Therapeutic activities;Patient/family education    PT Goals (Current goals can be found in the Care Plan  section)  Acute Rehab PT Goals Patient Stated Goal: to go home PT Goal Formulation: With patient Potential to Achieve Goals: Good    Frequency Min 4X/week           End of Session Equipment Utilized During Treatment: Gait belt Activity Tolerance: Patient tolerated treatment well Patient left: in chair;with call bell/phone within reach;with chair alarm set           Time: [id number] PT Time Calculation (min) (ACUTE ONLY): 40 min   Charges:   PT Evaluation $PT Eval Moderate Complexity: 1 Procedure     PT G CodesRayetta Humphrey, PT CLT [phone] 10/08/2016, 9:10 AM

## 2016-10-08 NOTE — NC FL2 (Signed)
[place] LEVEL OF CARE SCREENING TOOL     IDENTIFICATION  Patient Name: [name] Birthdate: April 17, 1926 Sex: male Admission Date (Current Location): 10/01/2016  [facility] and [place] Number:  [employer] and Address:  [place] [address], [place]      Provider Number: [id number]  Attending Physician Name and Address:  Velvet Bathe, MD  Relative Name and Phone Number:       Current Level of Care: Hospital Recommended Level of Care: [place] Prior Approval Number:    Date Approved/Denied:   PASRR Number: [id number]  Discharge Plan: SNF    Current Diagnoses: Patient Active Problem List   Diagnosis Date Noted  . Coagulopathy ([place]) 10/07/2016  . Leg weakness, bilateral   . Supratherapeutic INR 09/24/2016  . Abdominal pain 09/23/2016  . Malignant neoplasm of lateral wall of urinary bladder ([place]) 09/23/2016  . Acute epigastric pain 08/27/2016  . Dementia 08/27/2016  . Cholelithiasis 08/27/2016  . Bladder wall thickening 08/27/2016  . Calculus of gallbladder with chronic cholecystitis without obstruction   . Arthritis of both knees 07/30/2016  . Chronic systolic congestive heart failure ([place]) 04/01/2016  . Insomnia 03/06/2016  . Dyslipidemia 12/26/2015  . Fecal soiling due to fecal incontinence 09/12/2015  . Benign prostatic hyperplasia with nocturia 08/02/2015  . Arthritis, hip 08/02/2015  . At high risk for falls 07/14/2015  . Hereditary and idiopathic peripheral neuropathy 03/06/2015  . Pre-diabetes 12/23/2014  . Hypothyroid 11/18/2013  . Long term current use of anticoagulant therapy 10/05/2013  . Paresthesias 10/05/2013  . Paroxysmal atrial fibrillation ([place]) 01/25/2013  . Bruit 11/20/2011  . Coronary atherosclerosis 12/12/2010  . Hypertensive heart and renal disease with renal failure 01/05/2009  . CARDIOMYOPATHY 01/05/2009    Orientation RESPIRATION BLADDER Height & Weight      Self (Patient is currently confulsed)  Normal Incontinent Weight: 163 lb 9.6 oz (74.2 kg) Height:  5\' 6"  (167.6 cm)  BEHAVIORAL SYMPTOMS/MOOD NEUROLOGICAL BOWEL NUTRITION STATUS      Continent Diet (Heart Healthy)  AMBULATORY STATUS COMMUNICATION OF NEEDS Skin   Limited Assist Verbally Normal                       Personal Care Assistance Level of Assistance  Bathing, Dressing, Feeding Bathing Assistance: Limited assistance Feeding assistance: Independent Dressing Assistance: Limited assistance     Functional Limitations Info  Sight, Speech, Hearing Sight Info: Adequate Hearing Info: Adequate Speech Info: Adequate    SPECIAL CARE FACTORS FREQUENCY  PT (By licensed PT)     PT Frequency: 5x/week              Contractures Contractures Info: Not present    Additional Factors Info  Code Status, Allergies, Psychotropic Code Status Info: Full code Allergies Info: Amiodarone, Penicillins Psychotropic Info: Cymbalta, Lexapro, Neurontin, Ativan         Current Medications (10/08/2016):  This is the current hospital active medication list Current Facility-Administered Medications  Medication Dose Route Frequency Provider Last Rate Last Dose  . 0.9 %  sodium chloride infusion   Intravenous Continuous Francine Graven, DO 100 mL/hr at 10/08/16 [id number]    . acetaminophen (TYLENOL) tablet 650 mg  650 mg Oral Q6H PRN Truett Mainland, DO   650 mg at 10/07/16 2118   Or  . acetaminophen (TYLENOL) suppository 650 mg  650 mg Rectal Q6H PRN Tanna Savoy Stinson, DO      . carvedilol (COREG)  tablet 6.25 mg  6.25 mg Oral BID WC Tanna Savoy Stinson, DO   6.25 mg at 10/08/16 0830  . citalopram (CELEXA) tablet 20 mg  20 mg Oral Daily Tanna Savoy Stinson, DO   20 mg at 10/08/16 1050  . dicyclomine (BENTYL) capsule 10 mg  10 mg Oral TID AC & HS Tanna Savoy Stinson, DO   10 mg at 10/08/16 [id number]  . digoxin (LANOXIN) tablet 125 mcg  125 mcg Oral Daily Tanna Savoy Stinson, DO   125 mcg at 10/08/16 1050  .  donepezil (ARICEPT) tablet 10 mg  10 mg Oral QHS Tanna Savoy Stinson, DO   10 mg at 10/07/16 2117  . DULoxetine (CYMBALTA) DR capsule 30 mg  30 mg Oral Daily Tanna Savoy Stinson, DO   30 mg at 10/08/16 1051  . fenofibrate tablet 54 mg  54 mg Oral Daily Jani Gravel, MD   54 mg at 10/08/16 1052  . furosemide (LASIX) tablet 20 mg  20 mg Oral Daily Tanna Savoy Stinson, DO   20 mg at 10/08/16 1051  . gabapentin (NEURONTIN) capsule 400 mg  400 mg Oral TID Jani Gravel, MD   400 mg at 10/08/16 1051  . l-methylfolate-B6-B12 (METANX) 3-35-2 MG per tablet 1 tablet  1 tablet Oral BID Jani Gravel, MD   1 tablet at 10/08/16 1050  . levothyroxine (SYNTHROID, LEVOTHROID) tablet 125 mcg  125 mcg Oral QAC breakfast Truett Mainland, DO   125 mcg at 10/08/16 [id number]  . LORazepam (ATIVAN) tablet 0.5 mg  0.5 mg Oral BID PRN Tanna Savoy Stinson, DO   0.5 mg at 10/08/16 1051  . tamsulosin (FLOMAX) capsule 0.8 mg  0.8 mg Oral QPC supper Tanna Savoy Stinson, DO   0.8 mg at 10/07/16 1747  . traMADol (ULTRAM) tablet 50 mg  50 mg Oral Q6H PRN Velvet Bathe, MD   50 mg at 10/08/16 1051  . warfarin (COUMADIN) tablet 5 mg  5 mg Oral Once Velvet Bathe, MD      . Warfarin - Pharmacist Dosing Inpatient   Does not apply Q24H Velvet Bathe, MD   Stopped at 10/06/16 1551     Discharge Medications: Please see discharge summary for a list of discharge medications.  Relevant Imaging Results:  Relevant Lab Results:   Additional Information SSN [id number] Clydene Pugh, LCSW

## 2016-10-09 ENCOUNTER — Inpatient Hospital Stay (HOSPITAL_COMMUNITY): Payer: Medicare Other

## 2016-10-09 LAB — BASIC METABOLIC PANEL
Anion gap: 10 (ref 5–15)
BUN: 7 mg/dL (ref 6–20)
CALCIUM: 8.6 mg/dL — AB (ref 8.9–10.3)
CHLORIDE: 91 mmol/L — AB (ref 101–111)
CO2: 28 mmol/L (ref 22–32)
CREATININE: 0.85 mg/dL (ref 0.61–1.24)
Glucose, Bld: 164 mg/dL — ABNORMAL HIGH (ref 65–99)
Potassium: 3.3 mmol/L — ABNORMAL LOW (ref 3.5–5.1)
SODIUM: 129 mmol/L — AB (ref 135–145)

## 2016-10-09 LAB — PROTIME-INR
INR: 2.65
Prothrombin Time: 28.8 seconds — ABNORMAL HIGH (ref 11.4–15.2)

## 2016-10-09 MED ORDER — WARFARIN SODIUM 5 MG PO TABS
2.5000 mg | ORAL_TABLET | Freq: Once | ORAL | Status: AC
  Administered 2016-10-09: 2.5 mg via ORAL
  Filled 2016-10-09: qty 1

## 2016-10-09 MED ORDER — POTASSIUM CHLORIDE CRYS ER 20 MEQ PO TBCR
40.0000 meq | EXTENDED_RELEASE_TABLET | Freq: Once | ORAL | Status: DC
  Filled 2016-10-09: qty 2

## 2016-10-09 MED ORDER — FUROSEMIDE 40 MG PO TABS
40.0000 mg | ORAL_TABLET | Freq: Every day | ORAL | Status: DC
  Filled 2016-10-09 (×2): qty 1

## 2016-10-09 MED ORDER — WARFARIN SODIUM 5 MG PO TABS: ORAL_TABLET | ORAL | 2 refills | Status: AC

## 2016-10-09 MED ORDER — AMLODIPINE BESYLATE 5 MG PO TABS: 5.0000 mg | ORAL_TABLET | Freq: Every day | ORAL | 0 refills | Status: AC

## 2016-10-09 MED ORDER — HALOPERIDOL LACTATE 5 MG/ML IJ SOLN
1.0000 mg | Freq: Once | INTRAMUSCULAR | Status: AC
  Administered 2016-10-09: 1 mg via INTRAVENOUS
  Filled 2016-10-09: qty 1

## 2016-10-09 NOTE — Clinical Social Work Placement (Signed)
   CLINICAL SOCIAL WORK PLACEMENT  NOTE  Date:  10/09/2016  Patient Details  Name: [name] MRN: [id number] Date of Birth: 1926/08/02  Clinical Social Work is seeking post-discharge placement for this patient at the [place] level of care (*CSW will initial, date and re-position this form in  chart as items are completed):  Yes   Patient/family provided with [place] Work Department's list of facilities offering this level of care within the geographic area requested by the patient (or if unable, by the patient's family).  Yes   Patient/family informed of their freedom to choose among providers that offer the needed level of care, that participate in Medicare, Medicaid or managed care program needed by the patient, have an available bed and are willing to accept the patient.  Yes   Patient/family informed of [place]'s ownership interest in [facility] and [facility], as well as of the fact that they are under no obligation to receive care at these facilities.  PASRR submitted to EDS on 10/08/16     PASRR number received on 10/08/16     Existing PASRR number confirmed on       FL2 transmitted to all facilities in geographic area requested by pt/family on 10/08/16     FL2 transmitted to all facilities within larger geographic area on       Patient informed that his/her managed care company has contracts with or will negotiate with certain facilities, including the following:        Yes   Patient/family informed of bed offers received.  Patient chooses bed at [facility]     Physician recommends and patient chooses bed at      Patient to be transferred to [facility] on  .  Patient to be transferred to facility by       Patient family notified on   of transfer.  Name of family member notified:        PHYSICIAN       Additional Comment:    _______________________________________________ Ihor Gully,  LCSW 10/09/2016, 2:16 PM

## 2016-10-09 NOTE — Progress Notes (Addendum)
PROGRESS NOTE    [name]  [id number] DOB: 08/27/1926 DOA: 10/11/2016 PCP: Claretta Fraise, MD   Brief Narrative:  82 y.o. male with a history of dementia, bladder cancer, persistent atrial fibrillation CHADS Vasc score of 3 on Coumadin, dilated cardiomyopathy, coronary artery disease, COPD, hypertension, hypothyroidism. Patient seen for 2-3 days of persistent abdominal pain that is described as diffuse and nonradiating.  Assessment & Plan:   Principal Problem:   Supratherapeutic INR - INR trending down. Coumadin per pharmacy consult. Currently below goal and we want to discharge patient on goal of 2-3 to protect against stroke. INR at goal  Active Problems:   Hypertensive heart and renal disease with renal failure - Elevated and not well controlled will place back on amlodipine.  Hypokalemia -Mild will replace and reassess next a.m.    Paroxysmal atrial fibrillation (HCC) - Rate controlled currently, continue carvedilol    Dementia  - Stable continue home medication regimen - Pt required sitter overnight. Will discontinue sitter (needs no sitter for 24 hours prior to acceptance at SNF)    Cholelithiasis - Patient not complaining of any abdominal discomfort currently. Recommend outpatient evaluation. Tolerating diet.    Abdominal pain - Treat supportively secondary to cholecystitis/cholelithiasis  Addendum: Hypoxia: New problem as patient is not on supplemental oxygen at home. - in patient with history of systolic chf. I suspect patient is fluid overloaded. Will place on lasix 40 mg po today. Obtain chest x ray  DVT prophylaxis: supratherapeutic INR Code Status: Full Family Communication: Discussed directly with patient Disposition Plan: Pt wanted to go home but family and patient realized he was too weak and as such created safety concerns. Plan is for discharge to SNF for physical therapy. Discussed with social worker who is assisting with  placement   Consultants:   None  Procedures: None   Antimicrobials: None  Subjective: Pt has no new complaints. No acute issues overnight.  Objective: Vitals:   10/08/16 2036 10/09/16 0628 10/09/16 0902 10/09/16 1043  BP:  121/80    Pulse:  92    Resp:  20    Temp:  99.1 F (37.3 C)    TempSrc:  Oral    SpO2: (!) 87% 95% 90% 94%  Weight:      Height:        Intake/Output Summary (Last 24 hours) at 10/09/16 1222 Last data filed at 10/09/16 0800  Gross per 24 hour  Intake             1410 ml  Output             1350 ml  Net               60 ml   Filed Weights   10/03/2016 1717 09/28/2016 2030  Weight: 74.8 kg (165 lb) 74.2 kg (163 lb 9.6 oz)    Examination:  General exam: Appears calm and comfortable, in nad. Respiratory system: Clear to auscultation. Respiratory effort normal. Cardiovascular system: IRR. No JVD, murmurs, rubs, gallops or clicks.  Gastrointestinal system: Abdomen is nondistended, soft and no guarding.  No organomegaly or masses felt. Normal bowel sounds heard. Central nervous system: Alert and awake. No focal neurological deficits. Extremities: Symmetric 5 x 5 power. Skin: No rashes, lesions or ulcers, on limited exam. Psychiatry: difficult assessment due to dementia. Mood & affect appropriate.   Data Reviewed: I have personally reviewed following labs and imaging studies  CBC:  Recent Labs Lab 09/29/2016 1725 10/06/16 0659 10/07/16 0620 10/08/16  0601  WBC 6.2 4.4 4.8 5.5  NEUTROABS 4.3  --   --   --   HGB 15.4 12.8* 13.9 14.3  HCT 47.0 40.2 43.0 43.5  MCV 79.7 81.5 81.0 79.4  PLT 301 272 267 [id number]   Basic Metabolic Panel:  Recent Labs Lab 09/13/2016 1725 10/05/16 0642 10/05/16 1452 10/08/16 0601 10/09/16 0613  NA 136 138 137 131* 129*  K 3.4* 3.4* 3.7 3.0* 3.3*  CL 100* 104 105 97* 91*  CO2 25 26 27 26 28   GLUCOSE 140* 128* 161* 143* 164*  BUN 10 8 9  5* 7  CREATININE 1.27* 0.96 1.13 0.74 0.85  CALCIUM 9.4 8.5* 8.4* 8.3* 8.6*    GFR: Estimated Creatinine Clearance: 54.2 mL/min (by C-G formula based on SCr of 0.85 mg/dL). Liver Function Tests:  Recent Labs Lab 09/29/2016 1725 10/08/16 0601  AST 22 13*  ALT 14* 10*  ALKPHOS 67 57  BILITOT 0.7 0.6  PROT 7.7 6.2*  ALBUMIN 4.2 3.3*    Recent Labs Lab 09/17/2016 1725  LIPASE 19   No results for input(s): AMMONIA in the last 168 hours. Coagulation Profile:  Recent Labs Lab 10/05/16 2252 10/06/16 0659 10/07/16 0620 10/08/16 0601 10/09/16 0613  INR >5.00* 3.27 2.00 1.73 2.65   Cardiac Enzymes:  Recent Labs Lab 10/08/2016 1725  TROPONINI <0.03   BNP (last 3 results) No results for input(s): PROBNP in the last 8760 hours. HbA1C: No results for input(s): HGBA1C in the last 72 hours. CBG: No results for input(s): GLUCAP in the last 168 hours. Lipid Profile: No results for input(s): CHOL, HDL, LDLCALC, TRIG, CHOLHDL, LDLDIRECT in the last 72 hours. Thyroid Function Tests: No results for input(s): TSH, T4TOTAL, FREET4, T3FREE, THYROIDAB in the last 72 hours. Anemia Panel: No results for input(s): VITAMINB12, FOLATE, FERRITIN, TIBC, IRON, RETICCTPCT in the last 72 hours. Sepsis Labs:  Recent Labs Lab 09/30/2016 1906  LATICACIDVEN 1.0    Recent Results (from the past 240 hour(s))  Microscopic Examination     Status: Abnormal   Collection Time: 10/11/2016  3:56 PM  Result Value Ref Range Status   WBC, UA None seen 0 - 5 /hpf Final   RBC, UA 3-10 (A) 0 - 2 /hpf Final   Epithelial Cells (non renal) 0-10 0 - 10 /hpf Final   Renal Epithel, UA 0-10 (A) None seen /hpf Final   Casts Present None seen /lpf Final   Cast Type Hyaline casts N/A Final   Mucus, UA Present Not Estab. Final   Bacteria, UA None seen None seen/Few Final  Urine culture     Status: None   Collection Time: 10/11/2016  6:54 PM  Result Value Ref Range Status   Specimen Description URINE, RANDOM  Final   Special Requests NONE  Final   Culture NO GROWTH Performed at [facility]   Final   Report Status 10/07/2016 FINAL  Final    Radiology Studies: Ct Head Wo Contrast  Result Date: 10/07/2016 CLINICAL DATA:  82 year old hypertensive male with dilated cardiomyopathy presenting with bilateral leg weakness and unable to walk since 10/02/2016. Initial encounter. EXAM: CT HEAD WITHOUT CONTRAST TECHNIQUE: Contiguous axial images were obtained from the base of the skull through the vertex without intravenous contrast. COMPARISON:  01/28/2016 and 10/05/2013 head CT. FINDINGS: Brain: No intracranial hemorrhage or CT evidence of large acute infarct. 6 mm colloid cyst unchanged from 2014. Moderate atrophy without hydrocephalus. Chronic microvascular changes. Vascular: Vascular calcifications with ectasia. Slightly hyperdense appearance  middle cerebral artery branch vessels more notable on the left unchanged. Skull: No skull fracture. Sinuses/Orbits: No acute orbital abnormality. Partial opacification right sphenoid sinus. Opacification inferior aspect right mastoid air cells without obstructing lesion of eustachian tube noted. Other: Negative. IMPRESSION: No intracranial hemorrhage or CT evidence of large acute infarct. 6 mm colloid cyst unchanged from 2014. Atrophy without hydrocephalus. Chronic microvascular changes. Partial opacification right sphenoid sinus. Opacification inferior aspect right mastoid air cells. Electronically Signed   By: Genia Del M.D.   On: 10/07/2016 14:28    Scheduled Meds: . amLODipine  5 mg Oral Daily  . carvedilol  6.25 mg Oral BID WC  . citalopram  20 mg Oral Daily  . dicyclomine  10 mg Oral TID AC & HS  . digoxin  125 mcg Oral Daily  . donepezil  10 mg Oral QHS  . DULoxetine  30 mg Oral Daily  . fenofibrate  54 mg Oral Daily  . furosemide  40 mg Oral Daily  . gabapentin  400 mg Oral TID  . l-methylfolate-B6-B12  1 tablet Oral BID  . levothyroxine  125 mcg Oral QAC breakfast  . potassium chloride  40 mEq Oral Once  . tamsulosin  0.8 mg  Oral QPC supper  . warfarin  2.5 mg Oral Once  . Warfarin - Pharmacist Dosing Inpatient   Does not apply Q24H   Continuous Infusions:    LOS: 2 days    Time spent: > 35 minutes  Velvet Bathe, MD [facility] Pager [phone]  If 7PM-7AM, please contact night-coverage [email or web address] Password [facility] 10/09/2016, 12:22 PM

## 2016-10-09 NOTE — Progress Notes (Signed)
PT Cancellation Note  Patient Details Name: Rixton Lieb MRN: [id number] DOB: 1926-08-19   Cancelled Treatment:    Attempted to perform skilled PT treatment session with patient this morning, however patient fast asleep and appeared to become agitated as DPT tried to check O2 sats via pulse ox. Will re-attempt treatment later today if able, or on 12/28 otherwise.  Deniece Ree PT, DPT [phone]

## 2016-10-09 NOTE — Progress Notes (Signed)
[place] for Warfarin Indication: atrial fibrillation  Allergies  Allergen Reactions  . Amiodarone     Neuropathy  . Penicillins Itching    Has patient had a PCN reaction causing immediate rash, facial/tongue/throat swelling, SOB or lightheadedness with hypotension: Yesunknown Has patient had a PCN reaction causing severe rash involving mucus membranes or skin necrosis: unknown Has patient had a PCN reaction that required hospitalization Yesno  If all of the above answers are "NO", then may proceed with Ceph    Patient Measurements: Height: 5\' 6"  (167.6 cm) Weight: 163 lb 9.6 oz (74.2 kg) IBW/kg (Calculated) : 63.8  Vital Signs: Temp: 99.1 F (37.3 C) (12/27 0628) Temp Source: Oral (12/27 0628) BP: 121/80 (12/27 0628) Pulse Rate: 92 (12/27 0628)  Labs:  Recent Labs  10/07/16 0620 10/08/16 0601 10/09/16 0613  HGB 13.9 14.3  --   HCT 43.0 43.5  --   PLT 267 253  --   LABPROT 22.9* 20.4* 28.8*  INR 2.00 1.73 2.65  CREATININE  --  0.74 0.85    Estimated Creatinine Clearance: 54.2 mL/min (by C-G formula based on SCr of 0.85 mg/dL).   Medical History: Past Medical History:  Diagnosis Date  . Atrial fibrillation (HCC)    Persistent, treated with amiodarone and cardioversion  . CAD (coronary artery disease)    Last catheterization in August 2008 demonstrated the LAD 40% stenosis  . Cholecystitis   . COPD (chronic obstructive pulmonary disease) ([place])   . Dilated cardiomyopathy ([place])    Felt to be secondary to atrial fibrillation. EF is about 40%  . Drug therapy    Chronic Coumadin therapy  . Dyslipidemia   . Hypertension   . Hypothyroidism    Possibly related to amiodarone    Medications:  Prescriptions Prior to Admission  Medication Sig Dispense Refill Last Dose  . acetaminophen (TYLENOL) 500 MG tablet Take 1-2 tablets (500-1,000 mg total) by mouth 2 (two) times daily as needed. (Patient taking differently: Take  500-1,000 mg by mouth 2 (two) times daily as needed for mild pain. ) 30 tablet 0 Past Month at Unknown time  . donepezil (ARICEPT) 10 MG tablet Take 1 tablet (10 mg total) by mouth at bedtime. To help with memory and to help think more clearly 30 tablet 2 10/03/2016 at Unknown time  . [DISCONTINUED] warfarin (COUMADIN) 5 MG tablet Take 5 mg once daily except on Monday and Friday take 7.5 mg. (Patient taking differently: Take 5 mg once daily) 32 tablet 2 10/03/2016 at Unknown time  . amLODipine (NORVASC) 10 MG tablet TAKE 1 TABLET DAILY 30 tablet 10 10/10/2016  . carvedilol (COREG) 12.5 MG tablet Take 0.5 tablets (6.25 mg total) by mouth 2 (two) times daily with a meal. 30 tablet 0 10/10/2016  . digoxin (LANOXIN) 0.125 MG tablet Take 1 tablet (125 mcg total) by mouth daily. 30 tablet 2 09/27/2016  . DULoxetine (CYMBALTA) 30 MG capsule Take 1 capsule (30 mg total) by mouth daily. For arthritis pain in the knees 30 capsule 2 10/10/2016  . escitalopram (LEXAPRO) 5 MG tablet Take 5 mg by mouth daily.    10/11/2016  . fenofibrate micronized (LOFIBRA) 134 MG capsule TAKE (1) CAPSULE DAILY BEFORE BREAKFAST. 90 capsule 0 10/03/2016  . furosemide (LASIX) 20 MG tablet TAKE (1) TABLET DAILY IN THE MORNING. 30 tablet 5 10/03/2016  . gabapentin (NEURONTIN) 600 MG tablet Take 1 tablet (600 mg total) by mouth 3 (three) times daily. 90 tablet 2  10/03/2016  . HYDROcodone-acetaminophen (NORCO/VICODIN) 5-325 MG tablet Take one every 6-8 hours for pain not helped by tylenol alone 20 tablet 0 unknown  . levothyroxine (SYNTHROID, LEVOTHROID) 125 MCG tablet TAKE 1 TABLET DAILY 30 tablet 10 09/28/2016  . LORazepam (ATIVAN) 0.5 MG tablet Take 1 tablet (0.5 mg total) by mouth 2 (two) times daily as needed for anxiety. 30 tablet 1 10/01/2016  . NITROSTAT 0.4 MG SL tablet Place 1 tablet (0.4 mg total) under the tongue every 5 (five) minutesas needed for chest pain. 25 tablet 0 unknown  . polyethylene glycol powder  (GLYCOLAX/MIRALAX) powder Take 17 g by mouth daily as needed. For constipation 3350 g 5 unknown  . tamsulosin (FLOMAX) 0.4 MG CAPS capsule Take 2 capsules (0.8 mg total) by mouth daily after supper. For urine flow and prostate 60 capsule 3 09/13/2016    Assessment: Continuation of warfarin PTA for AFIB INR 2.65 after administration of Vit K on 12/22 and 12/23. Appears pt is more sensitive now and may not require as much coumadin as previously needed. No bleeding noted  Goal of Therapy:  INR 2-3 Monitor platelets by anticoagulation protocol: Yes   Plan:  Coumadin 2.5 mg po today INR/PT daily Monitor for signs of bleeding  [name], BS [place], BCPS Clinical Pharmacist Pager [phone] 10/09/2016,10:55 AM

## 2016-10-10 LAB — PROTIME-INR
INR: 2.73
PROTHROMBIN TIME: 29.5 s — AB (ref 11.4–15.2)

## 2016-10-10 MED ORDER — POTASSIUM CHLORIDE CRYS ER 20 MEQ PO TBCR
40.0000 meq | EXTENDED_RELEASE_TABLET | Freq: Once | ORAL | Status: AC
  Administered 2016-10-10: 40 meq via ORAL
  Filled 2016-10-10: qty 2

## 2016-10-10 MED ORDER — WARFARIN SODIUM 5 MG PO TABS
2.5000 mg | ORAL_TABLET | Freq: Once | ORAL | Status: AC
  Administered 2016-10-10: 2.5 mg via ORAL
  Filled 2016-10-10: qty 1

## 2016-10-10 MED ORDER — FUROSEMIDE 10 MG/ML IJ SOLN
60.0000 mg | Freq: Once | INTRAMUSCULAR | Status: AC
  Administered 2016-10-10: 60 mg via INTRAVENOUS
  Filled 2016-10-10: qty 6

## 2016-10-10 NOTE — Progress Notes (Signed)
PROGRESS NOTE    [name]  [id number] DOB: 1926/06/08 DOA: 09/21/2016 PCP: Claretta Fraise, MD   Brief Narrative:  82 y.o. male with a history of dementia, bladder cancer, persistent atrial fibrillation CHADS Vasc score of 3 on Coumadin, dilated cardiomyopathy, coronary artery disease, COPD, hypertension, hypothyroidism. Patient seen for 2-3 days of persistent abdominal pain that is described as diffuse and nonradiating.  Assessment & Plan:   Principal Problem: Hypoxia - secondary to acute systolic CHF exacerbation - Will diurese, assess bmp next am. - chest x ray reports: Diffuse interstitial prominence, likely mild edema/CHF. Small bilateral effusions with bibasilar atelectasis. - At baseline reportedly patient is not on supplemental oxygen.    Supratherapeutic INR -  INR at goal  Active Problems:   Hypertensive heart and renal disease with renal failure - Elevated and not well controlled will place back on amlodipine.  Hypokalemia - Mild will replace and reassess next a.m.    Paroxysmal atrial fibrillation (HCC) - Rate controlled currently, continue carvedilol    Dementia  - Stable continue home medication regimen - Pt required sitter overnight. Will discontinue sitter (needs no sitter for 24 hours prior to acceptance at SNF)    Cholelithiasis - Patient not complaining of any abdominal discomfort currently. Recommend outpatient evaluation. Tolerating diet.    Abdominal pain - Treat supportively secondary to cholecystitis/cholelithiasis   DVT prophylaxis: supratherapeutic INR Code Status: Full Family Communication: Discussed directly with patient Disposition Plan: SNF once hypoxia resolves  Consultants:   None  Procedures: None   Antimicrobials: None  Subjective: Pt has no new complaints. No acute issues overnight.  Objective: Vitals:   10/09/16 2208 10/10/16 0450 10/10/16 0912 10/10/16 1317  BP: (!) 148/77 139/89  137/83  Pulse: 74 79 90 79    Resp: 18 20  20   Temp: 98.4 F (36.9 C) 97.7 F (36.5 C)  97.4 F (36.3 C)  TempSrc: Oral Oral  Oral  SpO2: 99% 98%  92%  Weight:    76.5 kg (168 lb 10.4 oz)  Height:        Intake/Output Summary (Last 24 hours) at 10/10/16 1355 Last data filed at 10/10/16 1320  Gross per 24 hour  Intake              300 ml  Output             1350 ml  Net            -1050 ml   Filed Weights   09/29/2016 1717 10/05/2016 2030 10/10/16 1317  Weight: 74.8 kg (165 lb) 74.2 kg (163 lb 9.6 oz) 76.5 kg (168 lb 10.4 oz)    Examination:  General exam: Appears calm and comfortable, in nad. Respiratory system: Clear to auscultation. Respiratory effort normal. Cardiovascular system: IRR. No JVD, murmurs, rubs, gallops or clicks.  Gastrointestinal system: Abdomen is nondistended, soft and no guarding.  No organomegaly or masses felt. Normal bowel sounds heard. Central nervous system: Alert and awake. No focal neurological deficits. Extremities: Symmetric 5 x 5 power. Skin: No rashes, lesions or ulcers, on limited exam. Psychiatry: difficult assessment due to dementia. Mood & affect appropriate.   Data Reviewed: I have personally reviewed following labs and imaging studies  CBC:  Recent Labs Lab 10/02/2016 1725 10/06/16 0659 10/07/16 0620 10/08/16 0601  WBC 6.2 4.4 4.8 5.5  NEUTROABS 4.3  --   --   --   HGB 15.4 12.8* 13.9 14.3  HCT 47.0 40.2 43.0 43.5  MCV 79.7 81.5  81.0 79.4  PLT 301 272 267 [id number]   Basic Metabolic Panel:  Recent Labs Lab 09/18/2016 1725 10/05/16 0642 10/05/16 1452 10/08/16 0601 10/09/16 0613  NA 136 138 137 131* 129*  K 3.4* 3.4* 3.7 3.0* 3.3*  CL 100* 104 105 97* 91*  CO2 25 26 27 26 28   GLUCOSE 140* 128* 161* 143* 164*  BUN 10 8 9  5* 7  CREATININE 1.27* 0.96 1.13 0.74 0.85  CALCIUM 9.4 8.5* 8.4* 8.3* 8.6*   GFR: Estimated Creatinine Clearance: 54.2 mL/min (by C-G formula based on SCr of 0.85 mg/dL). Liver Function Tests:  Recent Labs Lab 09/14/2016 1725  10/08/16 0601  AST 22 13*  ALT 14* 10*  ALKPHOS 67 57  BILITOT 0.7 0.6  PROT 7.7 6.2*  ALBUMIN 4.2 3.3*    Recent Labs Lab 09/27/2016 1725  LIPASE 19   No results for input(s): AMMONIA in the last 168 hours. Coagulation Profile:  Recent Labs Lab 10/06/16 0659 10/07/16 0620 10/08/16 0601 10/09/16 0613 10/10/16 0617  INR 3.27 2.00 1.73 2.65 2.73   Cardiac Enzymes:  Recent Labs Lab 10/03/2016 1725  TROPONINI <0.03   BNP (last 3 results) No results for input(s): PROBNP in the last 8760 hours. HbA1C: No results for input(s): HGBA1C in the last 72 hours. CBG: No results for input(s): GLUCAP in the last 168 hours. Lipid Profile: No results for input(s): CHOL, HDL, LDLCALC, TRIG, CHOLHDL, LDLDIRECT in the last 72 hours. Thyroid Function Tests: No results for input(s): TSH, T4TOTAL, FREET4, T3FREE, THYROIDAB in the last 72 hours. Anemia Panel: No results for input(s): VITAMINB12, FOLATE, FERRITIN, TIBC, IRON, RETICCTPCT in the last 72 hours. Sepsis Labs:  Recent Labs Lab 10/06/2016 1906  LATICACIDVEN 1.0    Recent Results (from the past 240 hour(s))  Microscopic Examination     Status: Abnormal   Collection Time: 10/05/2016  3:56 PM  Result Value Ref Range Status   WBC, UA None seen 0 - 5 /hpf Final   RBC, UA 3-10 (A) 0 - 2 /hpf Final   Epithelial Cells (non renal) 0-10 0 - 10 /hpf Final   Renal Epithel, UA 0-10 (A) None seen /hpf Final   Casts Present None seen /lpf Final   Cast Type Hyaline casts N/A Final   Mucus, UA Present Not Estab. Final   Bacteria, UA None seen None seen/Few Final  Urine culture     Status: None   Collection Time: 09/17/2016  6:54 PM  Result Value Ref Range Status   Specimen Description URINE, RANDOM  Final   Special Requests NONE  Final   Culture NO GROWTH Performed at [facility]   Final   Report Status 10/07/2016 FINAL  Final    Radiology Studies: Dg Chest 2 View  Result Date: 10/09/2016 CLINICAL DATA:  Hypoxia EXAM:  CHEST  2 VIEW COMPARISON:  09/26/2016 FINDINGS: Mild cardiomegaly. Vascular congestion and diffuse interstitial prominence concerning for edema. Small bilateral pleural effusions and bibasilar atelectasis. IMPRESSION: Diffuse interstitial prominence, likely mild edema/CHF. Small bilateral effusions with bibasilar atelectasis. Electronically Signed   By: Rolm Baptise M.D.   On: 10/09/2016 14:36    Scheduled Meds: . amLODipine  5 mg Oral Daily  . carvedilol  6.25 mg Oral BID WC  . citalopram  20 mg Oral Daily  . dicyclomine  10 mg Oral TID AC & HS  . digoxin  125 mcg Oral Daily  . donepezil  10 mg Oral QHS  . DULoxetine  30 mg Oral  Daily  . fenofibrate  54 mg Oral Daily  . gabapentin  400 mg Oral TID  . l-methylfolate-B6-B12  1 tablet Oral BID  . levothyroxine  125 mcg Oral QAC breakfast  . potassium chloride  40 mEq Oral Once  . tamsulosin  0.8 mg Oral QPC supper  . warfarin  2.5 mg Oral Once  . Warfarin - Pharmacist Dosing Inpatient   Does not apply Q24H   Continuous Infusions:    LOS: 3 days    Time spent: > 35 minutes  Velvet Bathe, MD [facility] Pager [phone]  If 7PM-7AM, please contact night-coverage [email or web address] Password [email or web address] 10/10/2016, 1:55 PM

## 2016-10-10 NOTE — Care Management Note (Signed)
Case Management Note  Patient Details  Name: [name] MRN: [id number] Date of Birth: Aug 08, 1926   If discussed at Long Length of Stay Meetings, dates discussed:   10/10/2016 Additional Comments:  Tito Ausmus, Chauncey [place], RN 10/10/2016, 11:00 AM

## 2016-10-10 NOTE — Progress Notes (Signed)
[place] for Warfarin Indication: atrial fibrillation  Allergies  Allergen Reactions  . Amiodarone     Neuropathy  . Penicillins Itching    Has patient had a PCN reaction causing immediate rash, facial/tongue/throat swelling, SOB or lightheadedness with hypotension: Yesunknown Has patient had a PCN reaction causing severe rash involving mucus membranes or skin necrosis: unknown Has patient had a PCN reaction that required hospitalization Yesno  If all of the above answers are "NO", then may proceed with Ceph    Patient Measurements: Height: 5\' 6"  (167.6 cm) Weight: 163 lb 9.6 oz (74.2 kg) IBW/kg (Calculated) : 63.8  Vital Signs: Temp: 97.7 F (36.5 C) (12/28 0450) Temp Source: Oral (12/28 0450) BP: 139/89 (12/28 0450) Pulse Rate: 90 (12/28 0912)  Labs:  Recent Labs  10/08/16 0601 10/09/16 0613 10/10/16 0617  HGB 14.3  --   --   HCT 43.5  --   --   PLT 253  --   --   LABPROT 20.4* 28.8* 29.5*  INR 1.73 2.65 2.73  CREATININE 0.74 0.85  --     Estimated Creatinine Clearance: 54.2 mL/min (by C-G formula based on SCr of 0.85 mg/dL).   Medical History: Past Medical History:  Diagnosis Date  . Atrial fibrillation (HCC)    Persistent, treated with amiodarone and cardioversion  . CAD (coronary artery disease)    Last catheterization in August 2008 demonstrated the LAD 40% stenosis  . Cholecystitis   . COPD (chronic obstructive pulmonary disease) ([place])   . Dilated cardiomyopathy ([place])    Felt to be secondary to atrial fibrillation. EF is about 40%  . Drug therapy    Chronic Coumadin therapy  . Dyslipidemia   . Hypertension   . Hypothyroidism    Possibly related to amiodarone    Medications:  Prescriptions Prior to Admission  Medication Sig Dispense Refill Last Dose  . acetaminophen (TYLENOL) 500 MG tablet Take 1-2 tablets (500-1,000 mg total) by mouth 2 (two) times daily as needed. (Patient taking differently: Take  500-1,000 mg by mouth 2 (two) times daily as needed for mild pain. ) 30 tablet 0 Past Month at Unknown time  . donepezil (ARICEPT) 10 MG tablet Take 1 tablet (10 mg total) by mouth at bedtime. To help with memory and to help think more clearly 30 tablet 2 10/03/2016 at Unknown time  . [DISCONTINUED] warfarin (COUMADIN) 5 MG tablet Take 5 mg once daily except on Monday and Friday take 7.5 mg. (Patient taking differently: Take 5 mg once daily) 32 tablet 2 10/03/2016 at Unknown time  . amLODipine (NORVASC) 10 MG tablet TAKE 1 TABLET DAILY 30 tablet 10 10/02/2016  . carvedilol (COREG) 12.5 MG tablet Take 0.5 tablets (6.25 mg total) by mouth 2 (two) times daily with a meal. 30 tablet 0 09/17/2016  . digoxin (LANOXIN) 0.125 MG tablet Take 1 tablet (125 mcg total) by mouth daily. 30 tablet 2 09/30/2016  . DULoxetine (CYMBALTA) 30 MG capsule Take 1 capsule (30 mg total) by mouth daily. For arthritis pain in the knees 30 capsule 2 10/02/2016  . escitalopram (LEXAPRO) 5 MG tablet Take 5 mg by mouth daily.    10/03/2016  . fenofibrate micronized (LOFIBRA) 134 MG capsule TAKE (1) CAPSULE DAILY BEFORE BREAKFAST. 90 capsule 0 09/27/2016  . furosemide (LASIX) 20 MG tablet TAKE (1) TABLET DAILY IN THE MORNING. 30 tablet 5 09/20/2016  . gabapentin (NEURONTIN) 600 MG tablet Take 1 tablet (600 mg total) by mouth 3 (  three) times daily. 90 tablet 2 10/03/2016  . HYDROcodone-acetaminophen (NORCO/VICODIN) 5-325 MG tablet Take one every 6-8 hours for pain not helped by tylenol alone 20 tablet 0 unknown  . levothyroxine (SYNTHROID, LEVOTHROID) 125 MCG tablet TAKE 1 TABLET DAILY 30 tablet 10 10/05/2016  . LORazepam (ATIVAN) 0.5 MG tablet Take 1 tablet (0.5 mg total) by mouth 2 (two) times daily as needed for anxiety. 30 tablet 1 09/30/2016  . NITROSTAT 0.4 MG SL tablet Place 1 tablet (0.4 mg total) under the tongue every 5 (five) minutesas needed for chest pain. 25 tablet 0 unknown  . polyethylene glycol powder  (GLYCOLAX/MIRALAX) powder Take 17 g by mouth daily as needed. For constipation 3350 g 5 unknown  . tamsulosin (FLOMAX) 0.4 MG CAPS capsule Take 2 capsules (0.8 mg total) by mouth daily after supper. For urine flow and prostate 60 capsule 3 09/22/2016    Assessment: Continuation of warfarin PTA for AFIB INR 2.65 after administration of Vit K on 12/22 and 12/23. Appears pt is more sensitive now and may not require as much coumadin as previously needed. No bleeding noted. INR therapeutic.  Goal of Therapy:  INR 2-3 Monitor platelets by anticoagulation protocol: Yes   Plan:  Coumadin 2.5 mg po today INR/PT daily Monitor for signs of bleeding  Isac Sarna, BS [place], BCPS Clinical Pharmacist Pager [phone] 10/10/2016,10:06 AM

## 2016-10-10 NOTE — Progress Notes (Signed)
PT Cancellation Note  Patient Details Name: [name] MRN: [id number] DOB: 03-Nov-1925   Cancelled Treatment:    Attempted to treat patient this morning however he continues to be lethargic and deeply asleep. Patient did lightly wake up but then proceeded to fall back asleep, did not follow PT commands for attempted bed exercises.   Spoke to RN, who states they have been having similar problems with him being lethargic and is aware of difficulty PT is having with participation in face of this lethargy.   Will attempt treatment later today if able, otherwise will attempt on 12/29.  Deniece Ree PT, DPT [phone]

## 2016-10-10 NOTE — Progress Notes (Signed)
Patient lungs sounds Rhonchi and O2 sats 85% room air. 95% O2 at 2L. Dr. Wendee Beavers notified.

## 2016-10-11 ENCOUNTER — Ambulatory Visit: Payer: Medicare Other | Admitting: Student

## 2016-10-11 ENCOUNTER — Inpatient Hospital Stay (HOSPITAL_COMMUNITY): Payer: Medicare Other

## 2016-10-11 ENCOUNTER — Encounter: Payer: Self-pay | Admitting: Pharmacist

## 2016-10-11 DIAGNOSIS — R791 Abnormal coagulation profile: Principal | ICD-10-CM

## 2016-10-11 LAB — BLOOD GAS, ARTERIAL
ACID-BASE EXCESS: 7.2 mmol/L — AB (ref 0.0–2.0)
BICARBONATE: 30.4 mmol/L — AB (ref 20.0–28.0)
Drawn by: 221791
FIO2: 100
O2 Saturation: 88.7 %
PH ART: 7.468 — AB (ref 7.350–7.450)
Patient temperature: 37
pCO2 arterial: 43.5 mmHg (ref 32.0–48.0)
pO2, Arterial: 57.3 mmHg — ABNORMAL LOW (ref 83.0–108.0)

## 2016-10-11 LAB — BASIC METABOLIC PANEL
ANION GAP: 10 (ref 5–15)
BUN: 17 mg/dL (ref 6–20)
CHLORIDE: 93 mmol/L — AB (ref 101–111)
CO2: 30 mmol/L (ref 22–32)
Calcium: 8.9 mg/dL (ref 8.9–10.3)
Creatinine, Ser: 1.01 mg/dL (ref 0.61–1.24)
GFR calc non Af Amer: >60 mL/min (ref >60)
GLUCOSE: 162 mg/dL — AB (ref 65–99)
Potassium: 3 mmol/L — ABNORMAL LOW (ref 3.5–5.1)
Sodium: 133 mmol/L — ABNORMAL LOW (ref 135–145)

## 2016-10-11 LAB — PROTIME-INR
INR: 2.96
PROTHROMBIN TIME: 31.5 s — AB (ref 11.4–15.2)

## 2016-10-11 MED ORDER — SODIUM CHLORIDE 0.9 % IV SOLN
INTRAVENOUS | Status: AC
  Filled 2016-10-11: qty 1

## 2016-10-11 MED ORDER — WARFARIN SODIUM 5 MG PO TABS
2.5000 mg | ORAL_TABLET | Freq: Once | ORAL | Status: AC
Start: 2016-10-11 — End: 2016-10-11
  Administered 2016-10-11: 2.5 mg via ORAL
  Filled 2016-10-11: qty 1

## 2016-10-11 MED ORDER — SODIUM CHLORIDE 0.9 % IV SOLN
1.0000 g | Freq: Two times a day (BID) | INTRAVENOUS | Status: DC
  Administered 2016-10-11 – 2016-10-12 (×2): 1 g via INTRAVENOUS
  Filled 2016-10-11 (×8): qty 1

## 2016-10-11 MED ORDER — LISINOPRIL 5 MG PO TABS
2.5000 mg | ORAL_TABLET | Freq: Every day | ORAL | Status: DC
  Administered 2016-10-11: 2.5 mg via ORAL
  Filled 2016-10-11: qty 1

## 2016-10-11 MED ORDER — FUROSEMIDE 10 MG/ML IJ SOLN
40.0000 mg | Freq: Once | INTRAMUSCULAR | Status: AC
  Administered 2016-10-11: 40 mg via INTRAVENOUS
  Filled 2016-10-11: qty 4

## 2016-10-11 MED ORDER — LEVALBUTEROL HCL 0.63 MG/3ML IN NEBU
0.6300 mg | INHALATION_SOLUTION | Freq: Four times a day (QID) | RESPIRATORY_TRACT | Status: DC
  Administered 2016-10-11 – 2016-10-12 (×2): 0.63 mg via RESPIRATORY_TRACT
  Filled 2016-10-11: qty 3

## 2016-10-11 MED ORDER — FUROSEMIDE 10 MG/ML IJ SOLN
40.0000 mg | Freq: Two times a day (BID) | INTRAMUSCULAR | Status: DC
Start: 2016-10-11 — End: 2016-10-11
  Administered 2016-10-11: 40 mg via INTRAVENOUS
  Filled 2016-10-11: qty 4

## 2016-10-11 MED ORDER — FUROSEMIDE 10 MG/ML IJ SOLN
60.0000 mg | Freq: Two times a day (BID) | INTRAMUSCULAR | Status: DC
  Administered 2016-10-11: 60 mg via INTRAVENOUS
  Filled 2016-10-11: qty 6

## 2016-10-11 MED ORDER — POTASSIUM CHLORIDE CRYS ER 20 MEQ PO TBCR
40.0000 meq | EXTENDED_RELEASE_TABLET | Freq: Two times a day (BID) | ORAL | Status: DC
  Administered 2016-10-11: 40 meq via ORAL
  Filled 2016-10-11: qty 2

## 2016-10-11 MED ORDER — METOLAZONE 5 MG PO TABS
5.0000 mg | ORAL_TABLET | Freq: Once | ORAL | Status: AC
  Administered 2016-10-11: 5 mg via ORAL
  Filled 2016-10-11: qty 1

## 2016-10-11 MED ORDER — MAGNESIUM SULFATE 2 GM/50ML IV SOLN
2.0000 g | Freq: Once | INTRAVENOUS | Status: AC
  Administered 2016-10-11: 2 g via INTRAVENOUS
  Filled 2016-10-11: qty 50

## 2016-10-11 MED ORDER — NITROGLYCERIN 2 % TD OINT
0.5000 [in_us] | TOPICAL_OINTMENT | Freq: Four times a day (QID) | TRANSDERMAL | Status: DC
  Administered 2016-10-11 (×2): 0.5 [in_us] via TOPICAL
  Filled 2016-10-11 (×2): qty 1

## 2016-10-11 MED ORDER — FUROSEMIDE 10 MG/ML IJ SOLN
20.0000 mg | Freq: Once | INTRAMUSCULAR | Status: AC
  Administered 2016-10-11: 20 mg via INTRAVENOUS
  Filled 2016-10-11: qty 2

## 2016-10-11 NOTE — Progress Notes (Addendum)
PROGRESS NOTE                                                                                                                                                                                                             Patient Demographics:    [name], is a 82 y.o. male, DOB - 10/21/1925, [id number]  Admit date - 10/05/2016   Admitting Physician Truett Mainland, DO  Outpatient Primary MD for the patient is Claretta Fraise, MD  LOS - 4  Chief Complaint  Patient presents with  . Abdominal Pain       Brief Narrative  82 y.o.malewith a history of dementia, bladder cancer, persistent atrial fibrillation CHADS Vasc score of 3 on Coumadin, dilated cardiomyopathy, coronary artery disease, COPD, hypertension, hypothyroidism. Patient seen for 2-3 days of persistent abdominal pain that is described as diffuse and nonradiating.   Subjective:    [name] today has, No headache, No chest pain, No abdominal pain - No Nausea, No new weakness tingling or numbness, No Cough - +ve SOB.    Assessment  & Plan :     1.Acute hypoxic respiratory failure due to acute on chronic combined systolic and diastolic CHF EF [id number] - . Placed on IV Lasix, given Zaroxolyn already on Coreg which will be continued, will add low-dose ACE inhibitor and Nitropaste.   2. Hypertension. Stop Norvasc to provide room for Nitropaste, ACE and diuresis, continue Coreg.  3. Dementia. At risk for delirium. Continue Aricept. Minimize narcotics and benzodiazepines.  4. Depression and anxiety. Continue Celexa and Cymbalta.  5. Hypothyroidism. Continue Synthroid at home dose.  6. BPH. On Flomax.  7. Par. A. fib [place] vasc 2 score of at least 4. Continue beta blocker, pharmacy monitoring Coumadin. His INR was supra therapeutic upon admission now stable. No bleeding this admission.  8. History of bladder cancer, gallstones. Outpatient age-appropriate PCP  follow-up and workup.    Addendum - was paged at 6.45 pm - pt is febrile, apparently patient was fed by his family despite being told not to due to aspiration risk but they did feed him, suspect he has aspirated, Blood and Urine cultures, Meropenam, NPO, CXR, Nebs. Called and informed daughter and sister.   Family Communication  : None  Code Status :  Full  Diet : Diet Heart Room service appropriate? Yes; Fluid  consistency: Thin; Fluid restriction: 1500 mL Fluid   Disposition Plan  :  SNF 1-2 days  Consults  :    Procedures  :     CT head. Nonacute  CT abdomen and pelvis. Known history of bladder mass consistent with history of bladder cancer  Right upper quadrant ultrasound. Gallstones  DVT Prophylaxis  :  Couamdin  Lab Results  Component Value Date   INR 2.96 10/11/2016   INR 2.73 10/10/2016   INR 2.65 10/09/2016     Lab Results  Component Value Date   PLT 253 10/08/2016    Inpatient Medications  Scheduled Meds: . amLODipine  5 mg Oral Daily  . carvedilol  6.25 mg Oral BID WC  . citalopram  20 mg Oral Daily  . dicyclomine  10 mg Oral TID AC & HS  . digoxin  125 mcg Oral Daily  . donepezil  10 mg Oral QHS  . DULoxetine  30 mg Oral Daily  . fenofibrate  54 mg Oral Daily  . furosemide  40 mg Intravenous Q12H  . gabapentin  400 mg Oral TID  . l-methylfolate-B6-B12  1 tablet Oral BID  . levothyroxine  125 mcg Oral QAC breakfast  . potassium chloride  40 mEq Oral BID  . tamsulosin  0.8 mg Oral QPC supper  . warfarin  2.5 mg Oral Once  . Warfarin - Pharmacist Dosing Inpatient   Does not apply Q24H   Continuous Infusions: PRN Meds:.acetaminophen **OR** [DISCONTINUED] acetaminophen, levalbuterol  Antibiotics  :    Anti-infectives    None         Objective:   Vitals:   10/10/16 1317 10/10/16 2112 10/11/16 0238 10/11/16 0530  BP: 137/83 (!) 118/58 (!) 154/68 (!) 142/70  Pulse: 79 77 79 77  Resp: 20 18 20 20   Temp: 97.4 F (36.3 C) 98.7 F (37.1  C) 98.9 F (37.2 C) 98.3 F (36.8 C)  TempSrc: Oral Oral Oral Oral  SpO2: 92% 91% 93% 91%  Weight: 76.5 kg (168 lb 10.4 oz)   74.3 kg (163 lb 12.8 oz)  Height:        Wt Readings from Last 3 Encounters:  10/11/16 74.3 kg (163 lb 12.8 oz)  09/13/2016 77.6 kg (171 lb)  09/24/16 78 kg (172 lb)     Intake/Output Summary (Last 24 hours) at 10/11/16 1224 Last data filed at 10/11/16 0915  Gross per 24 hour  Intake              120 ml  Output             2350 ml  Net            -2230 ml     Physical Exam  Awake , but confused, No new F.N deficits, Normal affect [place].AT,PERRAL Supple Neck,No JVD, No cervical lymphadenopathy appriciated.  Symmetrical Chest wall movement, Good air movement bilaterally, ++ rales RRR,No Gallops,Rubs or new Murmurs, No Parasternal Heave +ve B.Sounds, Abd Soft, No tenderness, No organomegaly appriciated, No rebound - guarding or rigidity. No Cyanosis, Clubbing or edema, No new Rash or bruise       Data Review:    CBC  Recent Labs Lab 10/03/2016 1725 10/06/16 0659 10/07/16 0620 10/08/16 0601  WBC 6.2 4.4 4.8 5.5  HGB 15.4 12.8* 13.9 14.3  HCT 47.0 40.2 43.0 43.5  PLT 301 272 267 253  MCV 79.7 81.5 81.0 79.4  MCH 26.1 26.0 26.2 26.1  MCHC 32.8 31.8 32.3 32.9  RDW 15.0 15.4 15.0 14.7  LYMPHSABS 1.1  --   --   --   MONOABS 0.5  --   --   --   EOSABS 0.2  --   --   --   BASOSABS 0.0  --   --   --     Chemistries   Recent Labs Lab 10/02/2016 1725 10/05/16 0642 10/05/16 1452 10/08/16 0601 10/09/16 0613 10/11/16 0548  NA 136 138 137 131* 129* 133*  K 3.4* 3.4* 3.7 3.0* 3.3* 3.0*  CL 100* 104 105 97* 91* 93*  CO2 25 26 27 26 28 30   GLUCOSE 140* 128* 161* 143* 164* 162*  BUN 10 8 9  5* 7 17  CREATININE 1.27* 0.96 1.13 0.74 0.85 1.01  CALCIUM 9.4 8.5* 8.4* 8.3* 8.6* 8.9  AST 22  --   --  13*  --   --   ALT 14*  --   --  10*  --   --   ALKPHOS 67  --   --  57  --   --   BILITOT 0.7  --   --  0.6  --   --     ------------------------------------------------------------------------------------------------------------------ No results for input(s): CHOL, HDL, LDLCALC, TRIG, CHOLHDL, LDLDIRECT in the last 72 hours.  Lab Results  Component Value Date   HGBA1C 6.1 07/14/2015   ------------------------------------------------------------------------------------------------------------------ No results for input(s): TSH, T4TOTAL, T3FREE, THYROIDAB in the last 72 hours.  Invalid input(s): FREET3 ------------------------------------------------------------------------------------------------------------------ No results for input(s): VITAMINB12, FOLATE, FERRITIN, TIBC, IRON, RETICCTPCT in the last 72 hours.  Coagulation profile  Recent Labs Lab 10/07/16 0620 10/08/16 0601 10/09/16 0613 10/10/16 0617 10/11/16 0548  INR 2.00 1.73 2.65 2.73 2.96    No results for input(s): DDIMER in the last 72 hours.  Cardiac Enzymes  Recent Labs Lab 09/15/2016 1725  TROPONINI <0.03   ------------------------------------------------------------------------------------------------------------------    Component Value Date/Time   BNP 449.0 (H) 09/17/2016 1248    Micro Results Recent Results (from the past 240 hour(s))  Microscopic Examination     Status: Abnormal   Collection Time: 10/03/2016  3:56 PM  Result Value Ref Range Status   WBC, UA None seen 0 - 5 /hpf Final   RBC, UA 3-10 (A) 0 - 2 /hpf Final   Epithelial Cells (non renal) 0-10 0 - 10 /hpf Final   Renal Epithel, UA 0-10 (A) None seen /hpf Final   Casts Present None seen /lpf Final   Cast Type Hyaline casts N/A Final   Mucus, UA Present Not Estab. Final   Bacteria, UA None seen None seen/Few Final  Urine culture     Status: None   Collection Time: 09/17/2016  6:54 PM  Result Value Ref Range Status   Specimen Description URINE, RANDOM  Final   Special Requests NONE  Final   Culture NO GROWTH Performed at [facility]   Final    Report Status 10/07/2016 FINAL  Final    Radiology Reports Dg Chest 2 View  Result Date: 10/09/2016 CLINICAL DATA:  Hypoxia EXAM: CHEST  2 VIEW COMPARISON:  09/22/2016 FINDINGS: Mild cardiomegaly. Vascular congestion and diffuse interstitial prominence concerning for edema. Small bilateral pleural effusions and bibasilar atelectasis. IMPRESSION: Diffuse interstitial prominence, likely mild edema/CHF. Small bilateral effusions with bibasilar atelectasis. Electronically Signed   By: Rolm Baptise M.D.   On: 10/09/2016 14:36   Dg Chest 2 View  Result Date: 09/17/2016 CLINICAL DATA:  Shortness of breath since last night EXAM: CHEST  2 VIEW COMPARISON:  September 06, 2016 FINDINGS: The heart size and mediastinal contours are stable. The heart size is upper limits normal. There is no focal infiltrate, pulmonary edema, or pleural effusion. Mild central pulmonary vascular congestion is identified. Degenerative joint changes of the spine are noted. IMPRESSION: Mild central pulmonary vascular congestion without frank edema. Electronically Signed   By: Abelardo Diesel M.D.   On: 09/17/2016 12:49   Ct Head Wo Contrast  Result Date: 10/07/2016 CLINICAL DATA:  82 year old hypertensive male with dilated cardiomyopathy presenting with bilateral leg weakness and unable to walk since 09/15/2016. Initial encounter. EXAM: CT HEAD WITHOUT CONTRAST TECHNIQUE: Contiguous axial images were obtained from the base of the skull through the vertex without intravenous contrast. COMPARISON:  01/28/2016 and 10/05/2013 head CT. FINDINGS: Brain: No intracranial hemorrhage or CT evidence of large acute infarct. 6 mm colloid cyst unchanged from 2014. Moderate atrophy without hydrocephalus. Chronic microvascular changes. Vascular: Vascular calcifications with ectasia. Slightly hyperdense appearance middle cerebral artery branch vessels more notable on the left unchanged. Skull: No skull fracture. Sinuses/Orbits: No acute orbital  abnormality. Partial opacification right sphenoid sinus. Opacification inferior aspect right mastoid air cells without obstructing lesion of eustachian tube noted. Other: Negative. IMPRESSION: No intracranial hemorrhage or CT evidence of large acute infarct. 6 mm colloid cyst unchanged from 2014. Atrophy without hydrocephalus. Chronic microvascular changes. Partial opacification right sphenoid sinus. Opacification inferior aspect right mastoid air cells. Electronically Signed   By: Genia Del M.D.   On: 10/07/2016 14:28   Ct Abdomen Pelvis W Contrast  Result Date: 09/28/2016 CLINICAL DATA:  Severe abdominal pain, history of bladder cancer EXAM: CT ABDOMEN AND PELVIS WITH CONTRAST TECHNIQUE: Multidetector CT imaging of the abdomen and pelvis was performed using the standard protocol following bolus administration of intravenous contrast. CONTRAST:  100 mL Isovue 300 IV COMPARISON:  09/03/2016 FINDINGS: Lower chest: Mild subpleural reticulation at the lung bases. Hepatobiliary: The liver is within normal limits. Gallbladder is notable for a 14 mm layering gallstone (series 2/image 28). No associated inflammatory changes. Pancreas: Within normal limits. Spleen: Within normal limits. Adrenals/Urinary Tract: Adrenal glands are within normal limits. 1.5 cm lateral left upper pole renal cyst. Right kidney is within normal limits. No hydronephrosis. 1.5 x 3.4 cm irregular enhancing mass along the left anterior bladder wall (series 2/ image 81) with additional underlying irregular wall thickening (series 2/ image 79), corresponding to the patient's known primary bladder neoplasm. Stomach/Bowel: Stomach is within normal limits. No evidence of bowel obstruction. Normal appendix (series 2/ image 45). Left colonic diverticulosis, without evidence of diverticulitis. Vascular/Lymphatic: No evidence of abdominal aortic aneurysm. Atherosclerotic calcifications of the abdominal aorta and branch vessels. No suspicious  abdominopelvic lymphadenopathy. Reproductive: Mild prostatomegaly. Other: No abdominopelvic ascites. Small fat containing right inguinal hernia (series 2/ image 83). Musculoskeletal: Degenerative changes of the visualized thoracolumbar spine. IMPRESSION: Irregular enhancing mass along the left anterior bladder wall, corresponding to patient's known primary bladder neoplasm. Cholelithiasis, without associated inflammatory changes. No evidence of bowel obstruction.  Normal appendix. No CT findings to account for the patient's abdominal pain. Additional stable ancillary findings as above. Electronically Signed   By: Julian Hy M.D.   On: 10/11/2016 18:55   Dg Knee Complete 4 Views Left  Result Date: 09/17/2016 CLINICAL DATA:  Generalized pain and swelling after falling down steps yesterday. EXAM: LEFT KNEE - COMPLETE 4+ VIEW COMPARISON:  Multiple radiographic and MRI exam same year FINDINGS: There is a large knee joint effusion. No acute fracture. No  way compartment joint space narrowing or patellofemoral osteoarthritis. Calcification in Hoffa's fat pad could be calcified fat necrosis or a chondroma as noted at MRI. IMPRESSION: No acute finding. Joint effusion which has been present on previous studies and felt secondary to synovitis. Benign calcification in Hoffa's fat pad that could be fat necrosis or a chondroma. Electronically Signed   By: Nelson Chimes M.D.   On: 09/17/2016 11:41   Dg Knee Complete 4 Views Right  Result Date: 09/17/2016 CLINICAL DATA:  [address] down stairs landing on the knee EXAM: RIGHT KNEE - COMPLETE 4+ VIEW COMPARISON:  03/04/2016 FINDINGS: Small knee joint effusion. No osteoarthritic joint space narrowing. No fracture. Regional vascular calcification incidentally noted. IMPRESSION: Small joint effusion.  Otherwise negative. Electronically Signed   By: Nelson Chimes M.D.   On: 09/17/2016 11:42   Dg Abd Acute W/chest  Result Date: 10/07/2016 CLINICAL DATA:  Patient states that  he's had severe abdominal pain since last night, denies vomiting/diarrhea EXAM: DG ABDOMEN ACUTE W/ 1V CHEST COMPARISON:  09/17/2016 previous FINDINGS: Heart size normal.  Atheromatous aorta. Lungs are clear. No effusion. No free air. Normal bowel gas pattern. There are no abnormal calcifications. Facet disease in the lower lumbar spine. IMPRESSION: No acute cardiopulmonary disease. Negative abdominal radiographs. Electronically Signed   By: Lucrezia Europe M.D.   On: 09/30/2016 15:35   US Abdomen Limited Ruq  Result Date: 10/05/2016 CLINICAL DATA:  Abdominal pain 2 days. EXAM: US ABDOMEN LIMITED - RIGHT UPPER QUADRANT COMPARISON:  CT 09/24/2016 and ultrasound 08/27/2016 FINDINGS: Gallbladder: Cholelithiasis with several stones over the dependent portion of the gallbladder/gallbladder neck. No wall thickening. Negative sonographic Murphy's sign. No adjacent free fluid. Common bile duct: Diameter: 2.9 mm. Liver: No focal lesion identified. Within normal limits in parenchymal echogenicity. IMPRESSION: Cholelithiasis without additional sonographic evidence to suggest cholecystitis. Electronically Signed   By: Marin Olp M.D.   On: 10/05/2016 10:34    Time Spent in minutes  30   Kyndra Condron K M.D on 10/11/2016 at 12:24 PM  Between 7am to 7pm - Pager - [phone]  After 7pm go to [email or web address] - password [facility]  [facility] -  Office  [phone]

## 2016-10-11 NOTE — Progress Notes (Signed)
[facility] for Warfarin Indication: atrial fibrillation  Allergies  Allergen Reactions  . Amiodarone     Neuropathy  . Penicillins Itching    Has patient had a PCN reaction causing immediate rash, facial/tongue/throat swelling, SOB or lightheadedness with hypotension: Yesunknown Has patient had a PCN reaction causing severe rash involving mucus membranes or skin necrosis: unknown Has patient had a PCN reaction that required hospitalization Yesno  If all of the above answers are "NO", then may proceed with Ceph   Patient Measurements: Height: 5\' 6"  (167.6 cm) Weight: 163 lb 12.8 oz (74.3 kg) IBW/kg (Calculated) : 63.8  Vital Signs: Temp: 98.3 F (36.8 C) (12/29 0530) Temp Source: Oral (12/29 0530) BP: 142/70 (12/29 0530) Pulse Rate: 77 (12/29 0530)  Labs:  Recent Labs  10/09/16 0613 10/10/16 0617 10/11/16 0548  LABPROT 28.8* 29.5* 31.5*  INR 2.65 2.73 2.96  CREATININE 0.85  --  1.01   Estimated Creatinine Clearance: 45.6 mL/min (by C-G formula based on SCr of 1.01 mg/dL).  Medical History: Past Medical History:  Diagnosis Date  . Atrial fibrillation (HCC)    Persistent, treated with amiodarone and cardioversion  . CAD (coronary artery disease)    Last catheterization in August 2008 demonstrated the LAD 40% stenosis  . Cholecystitis   . COPD (chronic obstructive pulmonary disease) ([place])   . Dilated cardiomyopathy ([place])    Felt to be secondary to atrial fibrillation. EF is about 40%  . Drug therapy    Chronic Coumadin therapy  . Dyslipidemia   . Hypertension   . Hypothyroidism    Possibly related to amiodarone   Medications:  Prescriptions Prior to Admission  Medication Sig Dispense Refill Last Dose  . acetaminophen (TYLENOL) 500 MG tablet Take 1-2 tablets (500-1,000 mg total) by mouth 2 (two) times daily as needed. (Patient taking differently: Take 500-1,000 mg by mouth 2 (two) times daily as needed for mild pain. ) 30  tablet 0 Past Month at Unknown time  . donepezil (ARICEPT) 10 MG tablet Take 1 tablet (10 mg total) by mouth at bedtime. To help with memory and to help think more clearly 30 tablet 2 10/03/2016 at Unknown time  . [DISCONTINUED] warfarin (COUMADIN) 5 MG tablet Take 5 mg once daily except on Monday and Friday take 7.5 mg. (Patient taking differently: Take 5 mg once daily) 32 tablet 2 10/03/2016 at Unknown time  . amLODipine (NORVASC) 10 MG tablet TAKE 1 TABLET DAILY 30 tablet 10 09/25/2016  . carvedilol (COREG) 12.5 MG tablet Take 0.5 tablets (6.25 mg total) by mouth 2 (two) times daily with a meal. 30 tablet 0 09/30/2016  . digoxin (LANOXIN) 0.125 MG tablet Take 1 tablet (125 mcg total) by mouth daily. 30 tablet 2 09/25/2016  . DULoxetine (CYMBALTA) 30 MG capsule Take 1 capsule (30 mg total) by mouth daily. For arthritis pain in the knees 30 capsule 2 10/03/2016  . escitalopram (LEXAPRO) 5 MG tablet Take 5 mg by mouth daily.    10/10/2016  . fenofibrate micronized (LOFIBRA) 134 MG capsule TAKE (1) CAPSULE DAILY BEFORE BREAKFAST. 90 capsule 0 09/22/2016  . furosemide (LASIX) 20 MG tablet TAKE (1) TABLET DAILY IN THE MORNING. 30 tablet 5 10/13/2016  . gabapentin (NEURONTIN) 600 MG tablet Take 1 tablet (600 mg total) by mouth 3 (three) times daily. 90 tablet 2 09/17/2016  . HYDROcodone-acetaminophen (NORCO/VICODIN) 5-325 MG tablet Take one every 6-8 hours for pain not helped by tylenol alone 20 tablet 0 unknown  .  levothyroxine (SYNTHROID, LEVOTHROID) 125 MCG tablet TAKE 1 TABLET DAILY 30 tablet 10 09/17/2016  . LORazepam (ATIVAN) 0.5 MG tablet Take 1 tablet (0.5 mg total) by mouth 2 (two) times daily as needed for anxiety. 30 tablet 1 09/29/2016  . NITROSTAT 0.4 MG SL tablet Place 1 tablet (0.4 mg total) under the tongue every 5 (five) minutesas needed for chest pain. 25 tablet 0 unknown  . polyethylene glycol powder (GLYCOLAX/MIRALAX) powder Take 17 g by mouth daily as needed. For constipation 3350 g 5  unknown  . tamsulosin (FLOMAX) 0.4 MG CAPS capsule Take 2 capsules (0.8 mg total) by mouth daily after supper. For urine flow and prostate 60 capsule 3 10/13/2016   Assessment: Continuation of warfarin PTA for AFIB INR 2.65 after administration of Vit K on 12/22 and 12/23. Appears pt is more sensitive now and may not require as much coumadin as previously needed. No bleeding noted. INR therapeutic today.  Goal of Therapy:  INR 2-3 Monitor platelets by anticoagulation protocol: Yes   Plan:  Coumadin 2.5 mg po today INR/PT daily Monitor for signs of bleeding  Hart Robinsons, PharmD Clinical Pharmacist Pager:  [phone] 10/11/2016

## 2016-10-11 NOTE — Progress Notes (Signed)
Patient lethargic, arousable. Family trying to feed patient lunch. Educated patient's family that patient is not awake and alert enough to eat. Explained that the patient could aspirate. Family verbalized understanding. Patient's family continued to feed patient.

## 2016-10-11 NOTE — Progress Notes (Signed)
Pharmacy Antibiotic Note  [name] is a 82 y.o. male admitted on 09/14/2016 with pneumonia.  Pharmacy has been consulted for MEROPENEM dosing.  Plan: Meropenem 1gm IV q12hrs Monitor labs, progress, c/s  Height: 5\' 6"  (167.6 cm) Weight: 163 lb 12.8 oz (74.3 kg) IBW/kg (Calculated) : 63.8  Temp (24hrs), Avg:99.8 F (37.7 C), Min:98.3 F (36.8 C), Max:102.7 F (39.3 C)   Recent Labs Lab 10/05/16 0642 10/05/16 1452 10/06/16 0659 10/07/16 0620 10/08/16 0601 10/09/16 0613 10/11/16 0548  WBC  --   --  4.4 4.8 5.5  --   --   CREATININE 0.96 1.13  --   --  0.74 0.85 1.01    Estimated Creatinine Clearance: 45.6 mL/min (by C-G formula based on SCr of 1.01 mg/dL).    Allergies  Allergen Reactions  . Amiodarone     Neuropathy  . Penicillins Itching    Has patient had a PCN reaction causing immediate rash, facial/tongue/throat swelling, SOB or lightheadedness with hypotension: Yesunknown Has patient had a PCN reaction causing severe rash involving mucus membranes or skin necrosis: unknown Has patient had a PCN reaction that required hospitalization Yesno  If all of the above answers are "NO", then may proceed with Ceph   Antimicrobials this admission: Meropenem 12/29 >>   Dose adjustments this admission:  Microbiology results:  BCx: pending  UCx: pending   Sputum:    MRSA PCR:   Thank you for allowing pharmacy to be a part of this patient's care.  Hart Robinsons A 10/11/2016 7:21 PM

## 2016-10-11 NOTE — Progress Notes (Signed)
Pt arrived to unit at 2110 via stretcher and 2 nurses. Pt on nonrebreather and monitor. Pt is not responding.

## 2016-10-11 NOTE — NC FL2 (Signed)
[place] LEVEL OF CARE SCREENING TOOL     IDENTIFICATION  Patient Name: [name] Birthdate: 21-Feb-1926 Sex: male Admission Date (Current Location): 09/23/2016  [facility] and [place] Number:  [employer] and Address:  [facility] [address] [place]      Provider Number: [id number]  Attending Physician Name and Address:  Thurnell Lose, MD  Relative Name and Phone Number:       Current Level of Care: Hospital Recommended Level of Care: [place] Prior Approval Number:    Date Approved/Denied:   PASRR Number: [id number]  Discharge Plan: SNF    Current Diagnoses: Patient Active Problem List   Diagnosis Date Noted  . Coagulopathy ([place]) 10/07/2016  . Leg weakness, bilateral   . Supratherapeutic INR 09/15/2016  . Abdominal pain 10/11/2016  . Malignant neoplasm of lateral wall of urinary bladder ([place]) 09/23/2016  . Acute epigastric pain 08/27/2016  . Dementia 08/27/2016  . Cholelithiasis 08/27/2016  . Bladder wall thickening 08/27/2016  . Calculus of gallbladder with chronic cholecystitis without obstruction   . Arthritis of both knees 07/30/2016  . Chronic systolic congestive heart failure ([place]) 04/01/2016  . Insomnia 03/06/2016  . Dyslipidemia 12/26/2015  . Fecal soiling due to fecal incontinence 09/12/2015  . Benign prostatic hyperplasia with nocturia 08/02/2015  . Arthritis, hip 08/02/2015  . At high risk for falls 07/14/2015  . Hereditary and idiopathic peripheral neuropathy 03/06/2015  . Pre-diabetes 12/23/2014  . Hypothyroid 11/18/2013  . Long term current use of anticoagulant therapy 10/05/2013  . Paresthesias 10/05/2013  . Paroxysmal atrial fibrillation ([place]) 01/25/2013  . Bruit 11/20/2011  . Coronary atherosclerosis 12/12/2010  . Hypertensive heart and renal disease with renal failure 01/05/2009  . CARDIOMYOPATHY 01/05/2009    Orientation RESPIRATION BLADDER Height & Weight     Self (Patient is currently confulsed)  O2 (4 L) Incontinent Weight: 163 lb 12.8 oz (74.3 kg) Height:  5\' 6"  (167.6 cm)  BEHAVIORAL SYMPTOMS/MOOD NEUROLOGICAL BOWEL NUTRITION STATUS      Incontinent Diet (Heart Healthy)  AMBULATORY STATUS COMMUNICATION OF NEEDS Skin   Extensive Assist Verbally Normal                       Personal Care Assistance Level of Assistance  Bathing, Dressing, Feeding Bathing Assistance: Maximum assistance Feeding assistance: Limited assistance Dressing Assistance: Maximum assistance     Functional Limitations Info  Sight, Speech, Hearing Sight Info: Adequate Hearing Info: Adequate Speech Info: Adequate    SPECIAL CARE FACTORS FREQUENCY  PT (By licensed PT)     PT Frequency: 5x/week              Contractures Contractures Info: Not present    Additional Factors Info  Code Status, Allergies, Psychotropic Code Status Info: Full code Allergies Info: Amiodarone, Penicillins Psychotropic Info: Cymbalta, Lexapro, Neurontin, Ativan         Current Medications (10/11/2016):  This is the current hospital active medication list Current Facility-Administered Medications  Medication Dose Route Frequency Provider Last Rate Last Dose  . acetaminophen (TYLENOL) tablet 650 mg  650 mg Oral Q6H PRN Truett Mainland, DO   650 mg at 10/07/16 2118  . amLODipine (NORVASC) tablet 5 mg  5 mg Oral Daily Velvet Bathe, MD   5 mg at 10/11/16 0914  . carvedilol (COREG) tablet 6.25 mg  6.25 mg Oral BID WC Tanna Savoy Stinson, DO   6.25 mg at 10/11/16 [id number]  .  citalopram (CELEXA) tablet 20 mg  20 mg Oral Daily Tanna Savoy Stinson, DO   20 mg at 10/11/16 [id number]  . dicyclomine (BENTYL) capsule 10 mg  10 mg Oral TID AC & HS Tanna Savoy Stinson, DO   10 mg at 10/11/16 0913  . digoxin (LANOXIN) tablet 125 mcg  125 mcg Oral Daily Truett Mainland, DO   125 mcg at 10/11/16 [id number]  . donepezil (ARICEPT) tablet 10 mg  10 mg Oral QHS Tanna Savoy Stinson, DO   10 mg at 10/10/16 2119  . DULoxetine  (CYMBALTA) DR capsule 30 mg  30 mg Oral Daily Tanna Savoy Stinson, DO   30 mg at 10/11/16 [id number]  . fenofibrate tablet 54 mg  54 mg Oral Daily Jani Gravel, MD   54 mg at 10/11/16 0917  . furosemide (LASIX) injection 40 mg  40 mg Intravenous Q12H Thurnell Lose, MD   40 mg at 10/11/16 0912  . gabapentin (NEURONTIN) capsule 400 mg  400 mg Oral TID Jani Gravel, MD   400 mg at 10/11/16 0914  . l-methylfolate-B6-B12 (METANX) 3-35-2 MG per tablet 1 tablet  1 tablet Oral BID Jani Gravel, MD   1 tablet at 10/11/16 [phone]  . levalbuterol (XOPENEX) nebulizer solution 0.63 mg  0.63 mg Nebulization Q6H PRN Gardiner Barefoot, NP   0.63 mg at 10/08/16 2035  . levothyroxine (SYNTHROID, LEVOTHROID) tablet 125 mcg  125 mcg Oral QAC breakfast Truett Mainland, DO   125 mcg at 10/11/16 0913  . potassium chloride SA (K-DUR,KLOR-CON) CR tablet 40 mEq  40 mEq Oral BID Thurnell Lose, MD   40 mEq at 10/11/16 0912  . tamsulosin (FLOMAX) capsule 0.8 mg  0.8 mg Oral QPC supper Tanna Savoy Stinson, DO   0.8 mg at 10/10/16 1722  . Warfarin - Pharmacist Dosing Inpatient   Does not apply Q24H Velvet Bathe, MD         Discharge Medications: Please see discharge summary for a list of discharge medications.  Relevant Imaging Results:  Relevant Lab Results:   Additional Information SSN [place], [place], [place]

## 2016-10-11 NOTE — Progress Notes (Signed)
PT Cancellation Note  Patient Details Name: [name] MRN: [id number] DOB: 1926-07-05   Cancelled Treatment:    Reason Eval/Treat Not Completed: Fatigue/lethargy limiting ability to participate   Rayetta Humphrey, PT CLT [phone] 10/11/2016, 3:54 PM

## 2016-10-11 NOTE — Progress Notes (Signed)
Patient observed to be diaphoretic. Temp 102.7 O2 sats 80% 4L via nasal cannula. Respiratory notified. Dr. Candiss Norse notified.

## 2016-10-11 NOTE — Clinical Social Work Note (Signed)
CSW updated [employer] and pt's daughter, Chong Sicilian on pt. Possible d/c this weekend.   Benay Pike, [place]

## 2016-10-12 ENCOUNTER — Inpatient Hospital Stay (HOSPITAL_COMMUNITY): Payer: Medicare Other

## 2016-10-12 LAB — CBC WITH DIFFERENTIAL/PLATELET
Basophils Absolute: 0 10*3/uL (ref 0.0–0.1)
Basophils Relative: 0 %
EOS ABS: 0 10*3/uL (ref 0.0–0.7)
EOS PCT: 0 %
HCT: 50.5 % (ref 39.0–52.0)
Hemoglobin: 16.1 g/dL (ref 13.0–17.0)
LYMPHS ABS: 1.2 10*3/uL (ref 0.7–4.0)
Lymphocytes Relative: 12 %
MCH: 25.8 pg — AB (ref 26.0–34.0)
MCHC: 31.9 g/dL (ref 30.0–36.0)
MCV: 80.8 fL (ref 78.0–100.0)
MONO ABS: 1 10*3/uL (ref 0.1–1.0)
MONOS PCT: 9 %
Neutro Abs: 8.3 10*3/uL — ABNORMAL HIGH (ref 1.7–7.7)
Neutrophils Relative %: 79 %
PLATELETS: 434 10*3/uL — AB (ref 150–400)
RBC: 6.25 MIL/uL — ABNORMAL HIGH (ref 4.22–5.81)
RDW: 15.1 % (ref 11.5–15.5)
WBC: 10.5 10*3/uL (ref 4.0–10.5)

## 2016-10-12 LAB — COMPREHENSIVE METABOLIC PANEL
ALBUMIN: 3.2 g/dL — AB (ref 3.5–5.0)
ALK PHOS: 52 U/L (ref 38–126)
ALK PHOS: 46 U/L (ref 38–126)
ALT: 11 U/L — ABNORMAL LOW (ref 17–63)
ALT: 11 U/L — ABNORMAL LOW (ref 17–63)
ANION GAP: 12 (ref 5–15)
ANION GAP: 14 (ref 5–15)
AST: 17 U/L (ref 15–41)
AST: 17 U/L (ref 15–41)
Albumin: 3.4 g/dL — ABNORMAL LOW (ref 3.5–5.0)
BILIRUBIN TOTAL: 1 mg/dL (ref 0.3–1.2)
BUN: 33 mg/dL — ABNORMAL HIGH (ref 6–20)
BUN: 38 mg/dL — ABNORMAL HIGH (ref 6–20)
CALCIUM: 9.1 mg/dL (ref 8.9–10.3)
CALCIUM: 8.8 mg/dL — AB (ref 8.9–10.3)
CHLORIDE: 94 mmol/L — AB (ref 101–111)
CO2: 28 mmol/L (ref 22–32)
CO2: 28 mmol/L (ref 22–32)
Chloride: 95 mmol/L — ABNORMAL LOW (ref 101–111)
Creatinine, Ser: 2.65 mg/dL — ABNORMAL HIGH (ref 0.61–1.24)
Creatinine, Ser: 3.22 mg/dL — ABNORMAL HIGH (ref 0.61–1.24)
GFR calc non Af Amer: 20 mL/min — ABNORMAL LOW (ref >60)
GFR calc non Af Amer: 16 mL/min — ABNORMAL LOW (ref >60)
GFR, EST AFRICAN AMERICAN: 23 mL/min — AB (ref >60)
GFR, EST AFRICAN AMERICAN: 18 mL/min — AB (ref >60)
GLUCOSE: 221 mg/dL — AB (ref 65–99)
Glucose, Bld: 227 mg/dL — ABNORMAL HIGH (ref 65–99)
POTASSIUM: 4.2 mmol/L (ref 3.5–5.1)
Potassium: 4.2 mmol/L (ref 3.5–5.1)
SODIUM: 134 mmol/L — AB (ref 135–145)
SODIUM: 137 mmol/L (ref 135–145)
TOTAL PROTEIN: 6.9 g/dL (ref 6.5–8.1)
Total Bilirubin: 1 mg/dL (ref 0.3–1.2)
Total Protein: 7.3 g/dL (ref 6.5–8.1)

## 2016-10-12 LAB — BLOOD GAS, ARTERIAL
Acid-Base Excess: 2.1 mmol/L — ABNORMAL HIGH (ref 0.0–2.0)
BICARBONATE: 25.3 mmol/L (ref 20.0–28.0)
DRAWN BY: 221791
Delivery systems: POSITIVE
Expiratory PAP: 10
FIO2: 100
INSPIRATORY PAP: 15
O2 SAT: 90 %
PATIENT TEMPERATURE: 37
PCO2 ART: 50.6 mmHg — AB (ref 32.0–48.0)
PO2 ART: 64.6 mmHg — AB (ref 83.0–108.0)
RATE: 12 resp/min
pH, Arterial: 7.35 (ref 7.350–7.450)

## 2016-10-12 LAB — GLUCOSE, CAPILLARY: Glucose-Capillary: 214 mg/dL — ABNORMAL HIGH (ref 65–99)

## 2016-10-12 LAB — BRAIN NATRIURETIC PEPTIDE: B Natriuretic Peptide: 382 pg/mL — ABNORMAL HIGH (ref 0.0–100.0)

## 2016-10-12 LAB — PROTIME-INR
INR: 3.27
PROTHROMBIN TIME: 34.1 s — AB (ref 11.4–15.2)

## 2016-10-12 LAB — MRSA PCR SCREENING: MRSA BY PCR: NEGATIVE

## 2016-10-12 LAB — LACTIC ACID, PLASMA
LACTIC ACID, VENOUS: 2.6 mmol/L — AB (ref 0.5–1.9)
Lactic Acid, Venous: 2.5 mmol/L (ref 0.5–1.9)

## 2016-10-12 LAB — AMMONIA: Ammonia: 24 umol/L (ref 9–35)

## 2016-10-12 LAB — TROPONIN I: TROPONIN I: 0.11 ng/mL — AB (ref <0.03)

## 2016-10-12 LAB — DIGOXIN LEVEL: Digoxin Level: 0.7 ng/mL — ABNORMAL LOW (ref 0.8–2.0)

## 2016-10-12 LAB — MAGNESIUM: MAGNESIUM: 2.3 mg/dL (ref 1.7–2.4)

## 2016-10-12 MED ORDER — METHYLPREDNISOLONE SODIUM SUCC 40 MG IJ SOLR: 40.0000 mg | Freq: Two times a day (BID) | INTRAMUSCULAR | Status: DC

## 2016-10-12 MED ORDER — DIGOXIN 0.25 MG/ML IJ SOLN
0.1250 mg | Freq: Once | INTRAMUSCULAR | Status: AC
  Administered 2016-10-12: 0.125 mg via INTRAVENOUS
  Filled 2016-10-12: qty 2

## 2016-10-12 MED ORDER — IPRATROPIUM-ALBUTEROL 0.5-2.5 (3) MG/3ML IN SOLN: 3.0000 mL | Freq: Four times a day (QID) | RESPIRATORY_TRACT | Status: DC

## 2016-10-12 MED ORDER — SODIUM CHLORIDE 0.9 % IV BOLUS (SEPSIS)
500.0000 mL | Freq: Once | INTRAVENOUS | Status: AC
  Administered 2016-10-12: 500 mL via INTRAVENOUS

## 2016-10-12 MED ORDER — SODIUM CHLORIDE 0.9 % IV BOLUS (SEPSIS)
250.0000 mL | Freq: Once | INTRAVENOUS | Status: AC
  Administered 2016-10-12: 250 mL via INTRAVENOUS

## 2016-10-12 MED ORDER — SODIUM CHLORIDE 0.9 % IV SOLN
INTRAVENOUS | Status: DC
  Administered 2016-10-12: 07:00:00 via INTRAVENOUS

## 2016-10-12 MED ORDER — METHYLPREDNISOLONE SODIUM SUCC 125 MG IJ SOLR
80.0000 mg | Freq: Four times a day (QID) | INTRAMUSCULAR | Status: DC
  Administered 2016-10-12: 80 mg via INTRAVENOUS
  Filled 2016-10-12: qty 2

## 2016-10-12 MED ORDER — METOPROLOL TARTRATE 5 MG/5ML IV SOLN: 5.0000 mg | INTRAVENOUS | Status: DC | PRN

## 2016-10-12 MED ORDER — LEVOTHYROXINE SODIUM 100 MCG IV SOLR
50.0000 ug | Freq: Every day | INTRAVENOUS | Status: DC
  Administered 2016-10-12: 50 ug via INTRAVENOUS
  Filled 2016-10-12 (×3): qty 5

## 2016-10-12 MED ORDER — CARVEDILOL 3.125 MG PO TABS: 3.1250 mg | ORAL_TABLET | Freq: Two times a day (BID) | ORAL | Status: DC

## 2016-10-12 MED ORDER — ESMOLOL HCL-SODIUM CHLORIDE 2000 MG/100ML IV SOLN
25.0000 ug/kg/min | INTRAVENOUS | Status: DC
Start: 2016-10-12 — End: 2016-10-12
  Administered 2016-10-12: 25 ug/kg/min via INTRAVENOUS
  Filled 2016-10-12: qty 100

## 2016-10-14 NOTE — Progress Notes (Signed)
Writer paged hospitalist regarding pt bp of 74/52. She stated to call interventist. Dr. Elsworth Soho called back and stated to monitor bp; pt bp is trending back up. Writer informed physician that he had a nitro patch on and it was removed and bp started to trend up. He stated to change bipap settings but he would put order in himself. Writer informed Dr. Elsworth Soho that pt does flinch his eyes when pupils are assessed. Bp is now 100/82. Mean is 87.

## 2016-10-14 NOTE — Discharge Summary (Signed)
[facility] Death Note                                                                                                                                                                                               [name] [id number] DOB: July 18, 1926 DOA: 10-24-2016  PCP: Claretta Fraise, MD  Admit date: 10-24-2016  Date of Death: Nov 01, 2016     Time of Death: 11.35 am   Pronounced By - RN  NotificationClaretta Fraise, MD notified of death of 11-01-16   History of present illness:   [name] is a 83 y.o. male with a history of -  dementia, bladder cancer, persistent atrial fibrillation CHADS Vasc score of 3 on Coumadin, dilated cardiomyopathy, coronary artery disease, COPD, hypertension, hypothyroidism. Patient seen for 2-3 days of persistent abdominal pain that is described as diffuse and non radiating, in the ER found to have supratheraputic INR needing FFPs, then went into CHF and finally aspirated as well.  During his hospitalization he developed acute on chronic systolic and diastolic heart failure along with aspiration pneumonia with severe acute hypoxic respiratory failure requiring BiPAP. Overall prognosis poor. Family has now agreed for general medical treatment, DO NOT RESUSCITATE if declines full comfort care.   Final Diagnoses:  Cause if death - CHF, Aspiration Pnemuonia  Signature  Thurnell Lose M.D on 11-01-2016 at 2:14 PM  [facility]  Office Phone [phone]  Total clinical and documentation time for today Under 30 minutes   Last Note                                                                     PROGRESS NOTE  Patient Demographics:    [name], is a 83 y.o. male, DOB - 12-27-1925, [id number]  Admit date - 09/14/2016   Admitting Physician Truett Mainland, DO  Outpatient Primary MD for the patient is Claretta Fraise, MD  LOS - 5  Chief Complaint  Patient presents with  . Abdominal Pain       Brief Narrative  83 y.o.malewith a history of dementia, bladder cancer, persistent atrial fibrillation CHADS Vasc score of 3 on Coumadin, dilated cardiomyopathy, coronary artery disease, COPD, hypertension, hypothyroidism. Patient seen for 2-3 days of persistent abdominal pain that is described as diffuse and nonradiating.  During his hospitalization he developed acute on chronic systolic and diastolic heart failure along with aspiration pneumonia with severe acute hypoxic respiratory failure requiring BiPAP. Overall prognosis poor. Family has now agreed for general medical treatment, DO NOT RESUSCITATE if declines full comfort care.   Subjective:    [name] today has, No headache, No chest pain, No abdominal pain - No Nausea, No new weakness tingling or numbness, No Cough - +ve SOB.    Assessment  & Plan :     1. Acute hypoxic respiratory failure due to acute on chronic combined systolic and diastolic CHF EF [id number] -  Placed on IV Lasix, given Zaroxolyn already on Coreg which will be continued, will add low-dose ACE inhibitor and Nitropaste.   Also patient aspirated 10/11/2016 and developed aspiration pneumonia with low-grade sepsis, subsequently developed acute renal failure, severe hypoxic respiratory failure requiring BiPAP. Placed on appropriate antibiotics, IV fluid bolus and maintenance. His lactate is relatively stable.  Overall prognosis poor. Family has now agreed for general medical treatment, DO NOT RESUSCITATE if declines full comfort care   2. Hypertension. For now nothing by mouth as needed IV Lopressor.  3. Dementia. At risk for delirium. Continue Aricept. Minimize narcotics and benzodiazepines.  4.  Depression and anxiety. Continue Celexa and Cymbalta.  5. Hypothyroidism. Continue Synthroid at home dose.  6. BPH. On Flomax.  7. Par. A. fib [place] vasc 2 score of at least 4. Continue beta blocker, pharmacy monitoring Coumadin. His INR was supra therapeutic upon admission now stable. No bleeding this admission.  8. History of bladder cancer, gallstones. Outpatient age-appropriate PCP follow-up and workup.       Family Communication  : None  Code Status :  Full  Diet : Diet NPO time specified Except for: Sips with Meds   Disposition Plan  :  Stepdown  Consults  :  Pulm  Procedures  :     CT head. Nonacute  CT abdomen and pelvis. Known history of bladder mass consistent with history of bladder cancer  Right upper quadrant ultrasound. Gallstones  DVT Prophylaxis  :  Couamdin  Lab Results  Component Value Date   INR 3.27 Nov 08, 2016   INR 2.96 10/11/2016   INR 2.73 10/10/2016     Lab Results  Component Value Date   PLT 434 (H) November 08, 2016    Inpatient Medications  Scheduled Meds: . ipratropium-albuterol  3 mL Nebulization Q6H  . levothyroxine  50 mcg Intravenous Daily  . meropenem (MERREM) IV  1 g Intravenous Q12H  . methylPREDNISolone (SOLU-MEDROL) injection  80 mg Intravenous Q6H  . tamsulosin  0.8 mg Oral QPC supper  . Warfarin - Pharmacist Dosing Inpatient   Does not apply Q24H   Continuous Infusions: . sodium chloride Stopped (2016-11-08 1203)  . esmolol Stopped (11-08-2016 1015)   PRN Meds:.levalbuterol, metoprolol  Antibiotics  :  Anti-infectives    Start     Dose/Rate Route Frequency Ordered Stop   10/11/16 2000  meropenem (MERREM) 1 g in sodium chloride 0.9 % 100 mL IVPB     1 g 200 mL/hr over 30 Minutes Intravenous Every 12 hours 10/11/16 1919           Objective:   Vitals:   11-05-16 0900 11-05-16 0915 2016-11-05 1000 November 05, 2016 1100  BP: (!) 84/56 (!) 81/70 137/65 (!) 67/29  Pulse: (!) 111 90 (!) 103 (!) 30  Resp: (!) 29 (!) 26 (!) 29  (!) 24  Temp:      TempSrc:      SpO2: 90% 91% 99% 91%  Weight:      Height:        Wt Readings from Last 3 Encounters:  11-05-16 72.3 kg (159 lb 6.3 oz)  10/01/2016 77.6 kg (171 lb)  09/24/16 78 kg (172 lb)     Intake/Output Summary (Last 24 hours) at 05-Nov-2016 1414 Last data filed at 11-05-16 [id number]  Gross per 24 hour  Intake            175.8 ml  Output              450 ml  Net           -274.2 ml     Physical Exam  Somnolent, No new F.N deficits, Normal affect [place].AT,PERRAL Supple Neck,No JVD, No cervical lymphadenopathy appriciated.  Symmetrical Chest wall movement, Good air movement bilaterally, ++ rales RRR,No Gallops,Rubs or new Murmurs, No Parasternal Heave +ve B.Sounds, Abd Soft, No tenderness, No organomegaly appriciated, No rebound - guarding or rigidity. No Cyanosis, Clubbing or edema, No new Rash or bruise       Data Review:    CBC  Recent Labs Lab 10/06/16 0659 10/07/16 0620 10/08/16 0601 November 05, 2016 0320  WBC 4.4 4.8 5.5 10.5  HGB 12.8* 13.9 14.3 16.1  HCT 40.2 43.0 43.5 50.5  PLT 272 267 253 434*  MCV 81.5 81.0 79.4 80.8  MCH 26.0 26.2 26.1 25.8*  MCHC 31.8 32.3 32.9 31.9  RDW 15.4 15.0 14.7 15.1  LYMPHSABS  --   --   --  1.2  MONOABS  --   --   --  1.0  EOSABS  --   --   --  0.0  BASOSABS  --   --   --  0.0    Chemistries   Recent Labs Lab 10/08/16 0601 10/09/16 0613 10/11/16 0548 2016-11-05 0320 11-05-16 0515 2016-11-05 0818  NA 131* 129* 133* 134*  --  137  K 3.0* 3.3* 3.0* 4.2  --  4.2  CL 97* 91* 93* 94*  --  95*  CO2 26 28 30 28   --  28  GLUCOSE 143* 164* 162* 227*  --  221*  BUN 5* 7 17 33*  --  38*  CREATININE 0.74 0.85 1.01 2.65*  --  3.22*  CALCIUM 8.3* 8.6* 8.9 9.1  --  8.8*  MG  --   --   --   --  2.3  --   AST 13*  --   --  17  --  17  ALT 10*  --   --  11*  --  11*  ALKPHOS 57  --   --  52  --  46  BILITOT 0.6  --   --  1.0  --  1.0    ------------------------------------------------------------------------------------------------------------------ No results for input(s): CHOL, HDL,  LDLCALC, TRIG, CHOLHDL, LDLDIRECT in the last 72 hours.  Lab Results  Component Value Date   HGBA1C 6.1 07/14/2015   ------------------------------------------------------------------------------------------------------------------ No results for input(s): TSH, T4TOTAL, T3FREE, THYROIDAB in the last 72 hours.  Invalid input(s): FREET3 ------------------------------------------------------------------------------------------------------------------ No results for input(s): VITAMINB12, FOLATE, FERRITIN, TIBC, IRON, RETICCTPCT in the last 72 hours.  Coagulation profile  Recent Labs Lab 10/08/16 0601 10/09/16 0613 10/10/16 0617 10/11/16 0548 10/24/16 0515  INR 1.73 2.65 2.73 2.96 3.27    No results for input(s): DDIMER in the last 72 hours.  Cardiac Enzymes  Recent Labs Lab October 24, 2016 0320  TROPONINI 0.11*   ------------------------------------------------------------------------------------------------------------------    Component Value Date/Time   BNP 382.0 (H) October 24, 2016 0320    Micro Results Recent Results (from the past 240 hour(s))  Microscopic Examination     Status: Abnormal   Collection Time: 09/24/2016  3:56 PM  Result Value Ref Range Status   WBC, UA None seen 0 - 5 /hpf Final   RBC, UA 3-10 (A) 0 - 2 /hpf Final   Epithelial Cells (non renal) 0-10 0 - 10 /hpf Final   Renal Epithel, UA 0-10 (A) None seen /hpf Final   Casts Present None seen /lpf Final   Cast Type Hyaline casts N/A Final   Mucus, UA Present Not Estab. Final   Bacteria, UA None seen None seen/Few Final  Urine culture     Status: None   Collection Time: 09/29/2016  6:54 PM  Result Value Ref Range Status   Specimen Description URINE, RANDOM  Final   Special Requests NONE  Final   Culture NO GROWTH Performed at [facility]   Final    Report Status 10/07/2016 FINAL  Final  MRSA PCR Screening     Status: None   Collection Time: 10/11/16  9:45 PM  Result Value Ref Range Status   MRSA by PCR NEGATIVE NEGATIVE Final    Comment:        The GeneXpert MRSA Assay (FDA approved for NASAL specimens only), is one component of a comprehensive MRSA colonization surveillance program. It is not intended to diagnose MRSA infection nor to guide or monitor treatment for MRSA infections.     Radiology Reports Dg Chest 2 View  Result Date: 10/09/2016 CLINICAL DATA:  Hypoxia EXAM: CHEST  2 VIEW COMPARISON:  09/26/2016 FINDINGS: Mild cardiomegaly. Vascular congestion and diffuse interstitial prominence concerning for edema. Small bilateral pleural effusions and bibasilar atelectasis. IMPRESSION: Diffuse interstitial prominence, likely mild edema/CHF. Small bilateral effusions with bibasilar atelectasis. Electronically Signed   By: Rolm Baptise M.D.   On: 10/09/2016 14:36   Dg Chest 2 View  Result Date: 09/17/2016 CLINICAL DATA:  Shortness of breath since last night EXAM: CHEST  2 VIEW COMPARISON:  September 06, 2016 FINDINGS: The heart size and mediastinal contours are stable. The heart size is upper limits normal. There is no focal infiltrate, pulmonary edema, or pleural effusion. Mild central pulmonary vascular congestion is identified. Degenerative joint changes of the spine are noted. IMPRESSION: Mild central pulmonary vascular congestion without frank edema. Electronically Signed   By: Abelardo Diesel M.D.   On: 09/17/2016 12:49   Ct Head Wo Contrast  Result Date: 24-Oct-2016 CLINICAL DATA:  Encephalopathy EXAM: CT HEAD WITHOUT CONTRAST TECHNIQUE: Contiguous axial images were obtained from the base of the skull through the vertex without intravenous contrast. COMPARISON:  10/07/2016 FINDINGS: Brain: There is atrophy and chronic small vessel disease changes. 6 mm colloid cysts again noted, stable. No hydrocephalus.  No acute infarction  or hemorrhage. Vascular: No hyperdense vessel or unexpected calcification. Skull: No acute calvarial abnormality. Sinuses/Orbits: Visualized paranasal sinuses and mastoids clear. Orbital soft tissues unremarkable. Other: None IMPRESSION: No acute intracranial abnormality. Atrophy, chronic microvascular disease. Stable 6 mm colloid cyst. Electronically Signed   By: Rolm Baptise M.D.   On: 10-16-2016 09:43   Ct Head Wo Contrast  Result Date: 10/07/2016 CLINICAL DATA:  83 year old hypertensive male with dilated cardiomyopathy presenting with bilateral leg weakness and unable to walk since 10/10/2016. Initial encounter. EXAM: CT HEAD WITHOUT CONTRAST TECHNIQUE: Contiguous axial images were obtained from the base of the skull through the vertex without intravenous contrast. COMPARISON:  01/28/2016 and 10/05/2013 head CT. FINDINGS: Brain: No intracranial hemorrhage or CT evidence of large acute infarct. 6 mm colloid cyst unchanged from 2014. Moderate atrophy without hydrocephalus. Chronic microvascular changes. Vascular: Vascular calcifications with ectasia. Slightly hyperdense appearance middle cerebral artery branch vessels more notable on the left unchanged. Skull: No skull fracture. Sinuses/Orbits: No acute orbital abnormality. Partial opacification right sphenoid sinus. Opacification inferior aspect right mastoid air cells without obstructing lesion of eustachian tube noted. Other: Negative. IMPRESSION: No intracranial hemorrhage or CT evidence of large acute infarct. 6 mm colloid cyst unchanged from 2014. Atrophy without hydrocephalus. Chronic microvascular changes. Partial opacification right sphenoid sinus. Opacification inferior aspect right mastoid air cells. Electronically Signed   By: Genia Del M.D.   On: 10/07/2016 14:28   Ct Abdomen Pelvis W Contrast  Result Date: 10/01/2016 CLINICAL DATA:  Severe abdominal pain, history of bladder cancer EXAM: CT ABDOMEN AND PELVIS WITH CONTRAST TECHNIQUE:  Multidetector CT imaging of the abdomen and pelvis was performed using the standard protocol following bolus administration of intravenous contrast. CONTRAST:  100 mL Isovue 300 IV COMPARISON:  09/03/2016 FINDINGS: Lower chest: Mild subpleural reticulation at the lung bases. Hepatobiliary: The liver is within normal limits. Gallbladder is notable for a 14 mm layering gallstone (series 2/image 28). No associated inflammatory changes. Pancreas: Within normal limits. Spleen: Within normal limits. Adrenals/Urinary Tract: Adrenal glands are within normal limits. 1.5 cm lateral left upper pole renal cyst. Right kidney is within normal limits. No hydronephrosis. 1.5 x 3.4 cm irregular enhancing mass along the left anterior bladder wall (series 2/ image 81) with additional underlying irregular wall thickening (series 2/ image 79), corresponding to the patient's known primary bladder neoplasm. Stomach/Bowel: Stomach is within normal limits. No evidence of bowel obstruction. Normal appendix (series 2/ image 45). Left colonic diverticulosis, without evidence of diverticulitis. Vascular/Lymphatic: No evidence of abdominal aortic aneurysm. Atherosclerotic calcifications of the abdominal aorta and branch vessels. No suspicious abdominopelvic lymphadenopathy. Reproductive: Mild prostatomegaly. Other: No abdominopelvic ascites. Small fat containing right inguinal hernia (series 2/ image 83). Musculoskeletal: Degenerative changes of the visualized thoracolumbar spine. IMPRESSION: Irregular enhancing mass along the left anterior bladder wall, corresponding to patient's known primary bladder neoplasm. Cholelithiasis, without associated inflammatory changes. No evidence of bowel obstruction.  Normal appendix. No CT findings to account for the patient's abdominal pain. Additional stable ancillary findings as above. Electronically Signed   By: Julian Hy M.D.   On: 10/01/2016 18:55   Dg Chest Port 1 View  Result Date:  10/16/16 CLINICAL DATA:  Increased shortness of breath.  Hypotension. EXAM: PORTABLE CHEST 1 VIEW COMPARISON:  Yesterday at 1902 hour, 10/09/2016 FINDINGS: Volume loss in the right hemithorax with dense right basilar opacity. This is progressed from prior exam. Left basilar opacity again seen, with decreased haziness. Unchanged heart size and tortuous thoracic  aorta. Persistent but decreased interstitial thickening. No pneumothorax. IMPRESSION: Dense right basilar opacity with possible volume loss, recommend correlation for aspiration. Left basilar haziness has improved. Persistent but improved interstitial thickening. Suspect decreased left pleural effusion from prior. Electronically Signed   By: Jeb Levering M.D.   On: 2016-10-16 03:38   Dg Chest Port 1 View  Result Date: 10/11/2016 CLINICAL DATA:  SOB. Hx of a-fib, CAD, COPD, dilated cardiomyopathy, hypertension, coronary stent placement, cardiac cath. EXAM: PORTABLE CHEST 1 VIEW COMPARISON:  10/09/2016 FINDINGS: There is dense opacification of the right hemithorax, increased since prior study. Increased opacity also identified at the left lung base. There are bilateral pleural effusions. The patient is slightly rotated towards the right. IMPRESSION: Significant increase in bilateral airspace filling and pleural effusions, right greater than left. Electronically Signed   By: Nolon Nations M.D.   On: 10/11/2016 19:35   Dg Knee Complete 4 Views Left  Result Date: 09/17/2016 CLINICAL DATA:  Generalized pain and swelling after falling down steps yesterday. EXAM: LEFT KNEE - COMPLETE 4+ VIEW COMPARISON:  Multiple radiographic and MRI exam same year FINDINGS: There is a large knee joint effusion. No acute fracture. No way compartment joint space narrowing or patellofemoral osteoarthritis. Calcification in Hoffa's fat pad could be calcified fat necrosis or a chondroma as noted at MRI. IMPRESSION: No acute finding. Joint effusion which has been present  on previous studies and felt secondary to synovitis. Benign calcification in Hoffa's fat pad that could be fat necrosis or a chondroma. Electronically Signed   By: Nelson Chimes M.D.   On: 09/17/2016 11:41   Dg Knee Complete 4 Views Right  Result Date: 09/17/2016 CLINICAL DATA:  [address] down stairs landing on the knee EXAM: RIGHT KNEE - COMPLETE 4+ VIEW COMPARISON:  03/04/2016 FINDINGS: Small knee joint effusion. No osteoarthritic joint space narrowing. No fracture. Regional vascular calcification incidentally noted. IMPRESSION: Small joint effusion.  Otherwise negative. Electronically Signed   By: Nelson Chimes M.D.   On: 09/17/2016 11:42   Dg Abd Acute W/chest  Result Date: 09/22/2016 CLINICAL DATA:  Patient states that he's had severe abdominal pain since last night, denies vomiting/diarrhea EXAM: DG ABDOMEN ACUTE W/ 1V CHEST COMPARISON:  09/17/2016 previous FINDINGS: Heart size normal.  Atheromatous aorta. Lungs are clear. No effusion. No free air. Normal bowel gas pattern. There are no abnormal calcifications. Facet disease in the lower lumbar spine. IMPRESSION: No acute cardiopulmonary disease. Negative abdominal radiographs. Electronically Signed   By: Lucrezia Europe M.D.   On: 10/08/2016 15:35   US Abdomen Limited Ruq  Result Date: 10/05/2016 CLINICAL DATA:  Abdominal pain 2 days. EXAM: US ABDOMEN LIMITED - RIGHT UPPER QUADRANT COMPARISON:  CT 10/11/2016 and ultrasound 08/27/2016 FINDINGS: Gallbladder: Cholelithiasis with several stones over the dependent portion of the gallbladder/gallbladder neck. No wall thickening. Negative sonographic Murphy's sign. No adjacent free fluid. Common bile duct: Diameter: 2.9 mm. Liver: No focal lesion identified. Within normal limits in parenchymal echogenicity. IMPRESSION: Cholelithiasis without additional sonographic evidence to suggest cholecystitis. Electronically Signed   By: Marin Olp M.D.   On: 10/05/2016 10:34    Time Spent in minutes   30   Collier Monica K M.D on Oct 16, 2016 at 2:14 PM  Between 7am to 7pm - Pager - [phone]  After 7pm go to [email or web address] - password [facility]  [facility] -  Office  [phone]

## 2016-10-14 NOTE — Progress Notes (Addendum)
PROGRESS NOTE                                                                                                                                                                                                             Patient Demographics:    [name], is a 83 y.o. male, DOB - Feb 23, 1926, [id number]  Admit date - 10/13/2016   Admitting Physician Truett Mainland, DO  Outpatient Primary MD for the patient is Claretta Fraise, MD  LOS - 5  Chief Complaint  Patient presents with  . Abdominal Pain       Brief Narrative  83 y.o.malewith a history of dementia, bladder cancer, persistent atrial fibrillation CHADS Vasc score of 3 on Coumadin, dilated cardiomyopathy, coronary artery disease, COPD, hypertension, hypothyroidism. Patient seen for 2-3 days of persistent abdominal pain that is described as diffuse and nonradiating.  During his hospitalization he developed acute on chronic systolic and diastolic heart failure along with aspiration pneumonia with severe acute hypoxic respiratory failure requiring BiPAP. Overall prognosis poor. Family has now agreed for general medical treatment, DO NOT RESUSCITATE if declines full comfort care.   Subjective:    [name] today has, No headache, No chest pain, No abdominal pain - No Nausea, No new weakness tingling or numbness, No Cough - +ve SOB.    Assessment  & Plan :     1. Acute hypoxic respiratory failure due to acute on chronic combined systolic and diastolic CHF EF [id number] -  Placed on IV Lasix, given Zaroxolyn already on Coreg which will be continued, will add low-dose ACE inhibitor and Nitropaste.   Also patient aspirated 10/11/2016 and developed aspiration pneumonia with low-grade sepsis, subsequently developed acute renal failure, severe hypoxic respiratory failure requiring BiPAP. Placed on appropriate antibiotics, IV fluid bolus and maintenance. His lactate is  relatively stable.  Overall prognosis poor. Family has now agreed for general medical treatment, DO NOT RESUSCITATE if declines full comfort care   2. Hypertension. For now nothing by mouth as needed IV Lopressor.  3. Dementia. At risk for delirium. Continue Aricept. Minimize narcotics and benzodiazepines.  4. Depression and anxiety. Continue Celexa and Cymbalta.  5. Hypothyroidism. Continue Synthroid at home dose.  6. BPH. On Flomax.  7. Par. A. fib [place] vasc 2 score of at least 4. Continue beta blocker, pharmacy monitoring Coumadin. His  INR was supra therapeutic upon admission now stable. No bleeding this admission.  8. History of bladder cancer, gallstones. Outpatient age-appropriate PCP follow-up and workup.       Family Communication  : None  Code Status :  Full  Diet : Diet NPO time specified Except for: Sips with Meds   Disposition Plan  :  Stepdown  Consults  :  Pulm  Procedures  :     CT head. Nonacute  CT abdomen and pelvis. Known history of bladder mass consistent with history of bladder cancer  Right upper quadrant ultrasound. Gallstones  DVT Prophylaxis  :  Couamdin  Lab Results  Component Value Date   INR 3.27 November 07, 2016   INR 2.96 10/11/2016   INR 2.73 10/10/2016     Lab Results  Component Value Date   PLT 434 (H) 2016-11-07    Inpatient Medications  Scheduled Meds: . ipratropium-albuterol  3 mL Nebulization Q6H  . levothyroxine  50 mcg Intravenous Daily  . meropenem (MERREM) IV  1 g Intravenous Q12H  . methylPREDNISolone (SOLU-MEDROL) injection  80 mg Intravenous Q6H  . tamsulosin  0.8 mg Oral QPC supper  . Warfarin - Pharmacist Dosing Inpatient   Does not apply Q24H   Continuous Infusions: . sodium chloride 75 mL/hr at November 07, 2016 0725  . esmolol 25 mcg/kg/min (11/07/2016 0949)   PRN Meds:.levalbuterol  Antibiotics  :    Anti-infectives    Start     Dose/Rate Route Frequency Ordered Stop   10/11/16 2000  meropenem (MERREM) 1 g in  sodium chloride 0.9 % 100 mL IVPB     1 g 200 mL/hr over 30 Minutes Intravenous Every 12 hours 10/11/16 1919           Objective:   Vitals:   2016-11-07 0845 11-07-16 0900 11-07-2016 0915 11-07-2016 1000  BP: (!) 78/53 (!) 84/56 (!) 81/70 137/65  Pulse: (!) 109 (!) 111 90 (!) 103  Resp: (!) 29 (!) 29 (!) 26 (!) 29  Temp:      TempSrc:      SpO2: 90% 90% 91% 99%  Weight:      Height:        Wt Readings from Last 3 Encounters:  2016-11-07 72.3 kg (159 lb 6.3 oz)  09/30/2016 77.6 kg (171 lb)  09/24/16 78 kg (172 lb)     Intake/Output Summary (Last 24 hours) at 11/07/2016 1126 Last data filed at Nov 07, 2016 [id number]  Gross per 24 hour  Intake            295.8 ml  Output             1600 ml  Net          -1304.2 ml     Physical Exam  Somnolent, No new F.N deficits, Normal affect [place].AT,PERRAL Supple Neck,No JVD, No cervical lymphadenopathy appriciated.  Symmetrical Chest wall movement, Good air movement bilaterally, ++ rales RRR,No Gallops,Rubs or new Murmurs, No Parasternal Heave +ve B.Sounds, Abd Soft, No tenderness, No organomegaly appriciated, No rebound - guarding or rigidity. No Cyanosis, Clubbing or edema, No new Rash or bruise       Data Review:    CBC  Recent Labs Lab 10/06/16 0659 10/07/16 0620 10/08/16 0601 07-Nov-2016 0320  WBC 4.4 4.8 5.5 10.5  HGB 12.8* 13.9 14.3 16.1  HCT 40.2 43.0 43.5 50.5  PLT 272 267 253 434*  MCV 81.5 81.0 79.4 80.8  MCH 26.0 26.2 26.1 25.8*  MCHC 31.8 32.3 32.9 31.9  RDW 15.4 15.0 14.7 15.1  LYMPHSABS  --   --   --  1.2  MONOABS  --   --   --  1.0  EOSABS  --   --   --  0.0  BASOSABS  --   --   --  0.0    Chemistries   Recent Labs Lab 10/08/16 0601 10/09/16 0613 10/11/16 0548 10/27/2016 0320 Oct 27, 2016 0515 10-27-16 0818  NA 131* 129* 133* 134*  --  137  K 3.0* 3.3* 3.0* 4.2  --  4.2  CL 97* 91* 93* 94*  --  95*  CO2 26 28 30 28   --  28  GLUCOSE 143* 164* 162* 227*  --  221*  BUN 5* 7 17 33*  --  38*  CREATININE 0.74 0.85  1.01 2.65*  --  3.22*  CALCIUM 8.3* 8.6* 8.9 9.1  --  8.8*  MG  --   --   --   --  2.3  --   AST 13*  --   --  17  --  17  ALT 10*  --   --  11*  --  11*  ALKPHOS 57  --   --  52  --  46  BILITOT 0.6  --   --  1.0  --  1.0   ------------------------------------------------------------------------------------------------------------------ No results for input(s): CHOL, HDL, LDLCALC, TRIG, CHOLHDL, LDLDIRECT in the last 72 hours.  Lab Results  Component Value Date   HGBA1C 6.1 07/14/2015   ------------------------------------------------------------------------------------------------------------------ No results for input(s): TSH, T4TOTAL, T3FREE, THYROIDAB in the last 72 hours.  Invalid input(s): FREET3 ------------------------------------------------------------------------------------------------------------------ No results for input(s): VITAMINB12, FOLATE, FERRITIN, TIBC, IRON, RETICCTPCT in the last 72 hours.  Coagulation profile  Recent Labs Lab 10/08/16 0601 10/09/16 0613 10/10/16 0617 10/11/16 0548 2016/10/27 0515  INR 1.73 2.65 2.73 2.96 3.27    No results for input(s): DDIMER in the last 72 hours.  Cardiac Enzymes  Recent Labs Lab October 27, 2016 0320  TROPONINI 0.11*   ------------------------------------------------------------------------------------------------------------------    Component Value Date/Time   BNP 382.0 (H) 10-27-2016 0320    Micro Results Recent Results (from the past 240 hour(s))  Microscopic Examination     Status: Abnormal   Collection Time: 09/20/2016  3:56 PM  Result Value Ref Range Status   WBC, UA None seen 0 - 5 /hpf Final   RBC, UA 3-10 (A) 0 - 2 /hpf Final   Epithelial Cells (non renal) 0-10 0 - 10 /hpf Final   Renal Epithel, UA 0-10 (A) None seen /hpf Final   Casts Present None seen /lpf Final   Cast Type Hyaline casts N/A Final   Mucus, UA Present Not Estab. Final   Bacteria, UA None seen None seen/Few Final  Urine culture      Status: None   Collection Time: 10/05/2016  6:54 PM  Result Value Ref Range Status   Specimen Description URINE, RANDOM  Final   Special Requests NONE  Final   Culture NO GROWTH Performed at [facility]   Final   Report Status 10/07/2016 FINAL  Final  MRSA PCR Screening     Status: None   Collection Time: 10/11/16  9:45 PM  Result Value Ref Range Status   MRSA by PCR NEGATIVE NEGATIVE Final    Comment:        The GeneXpert MRSA Assay (FDA approved for NASAL specimens only), is one component of a comprehensive MRSA colonization surveillance program. It is not intended  to diagnose MRSA infection nor to guide or monitor treatment for MRSA infections.     Radiology Reports Dg Chest 2 View  Result Date: 10/09/2016 CLINICAL DATA:  Hypoxia EXAM: CHEST  2 VIEW COMPARISON:  09/14/2016 FINDINGS: Mild cardiomegaly. Vascular congestion and diffuse interstitial prominence concerning for edema. Small bilateral pleural effusions and bibasilar atelectasis. IMPRESSION: Diffuse interstitial prominence, likely mild edema/CHF. Small bilateral effusions with bibasilar atelectasis. Electronically Signed   By: Rolm Baptise M.D.   On: 10/09/2016 14:36   Dg Chest 2 View  Result Date: 09/17/2016 CLINICAL DATA:  Shortness of breath since last night EXAM: CHEST  2 VIEW COMPARISON:  September 06, 2016 FINDINGS: The heart size and mediastinal contours are stable. The heart size is upper limits normal. There is no focal infiltrate, pulmonary edema, or pleural effusion. Mild central pulmonary vascular congestion is identified. Degenerative joint changes of the spine are noted. IMPRESSION: Mild central pulmonary vascular congestion without frank edema. Electronically Signed   By: Abelardo Diesel M.D.   On: 09/17/2016 12:49   Ct Head Wo Contrast  Result Date: 11-02-16 CLINICAL DATA:  Encephalopathy EXAM: CT HEAD WITHOUT CONTRAST TECHNIQUE: Contiguous axial images were obtained from the base of the skull  through the vertex without intravenous contrast. COMPARISON:  10/07/2016 FINDINGS: Brain: There is atrophy and chronic small vessel disease changes. 6 mm colloid cysts again noted, stable. No hydrocephalus. No acute infarction or hemorrhage. Vascular: No hyperdense vessel or unexpected calcification. Skull: No acute calvarial abnormality. Sinuses/Orbits: Visualized paranasal sinuses and mastoids clear. Orbital soft tissues unremarkable. Other: None IMPRESSION: No acute intracranial abnormality. Atrophy, chronic microvascular disease. Stable 6 mm colloid cyst. Electronically Signed   By: Rolm Baptise M.D.   On: 02-Nov-2016 09:43   Ct Head Wo Contrast  Result Date: 10/07/2016 CLINICAL DATA:  83 year old hypertensive male with dilated cardiomyopathy presenting with bilateral leg weakness and unable to walk since 09/28/2016. Initial encounter. EXAM: CT HEAD WITHOUT CONTRAST TECHNIQUE: Contiguous axial images were obtained from the base of the skull through the vertex without intravenous contrast. COMPARISON:  01/28/2016 and 10/05/2013 head CT. FINDINGS: Brain: No intracranial hemorrhage or CT evidence of large acute infarct. 6 mm colloid cyst unchanged from 2014. Moderate atrophy without hydrocephalus. Chronic microvascular changes. Vascular: Vascular calcifications with ectasia. Slightly hyperdense appearance middle cerebral artery branch vessels more notable on the left unchanged. Skull: No skull fracture. Sinuses/Orbits: No acute orbital abnormality. Partial opacification right sphenoid sinus. Opacification inferior aspect right mastoid air cells without obstructing lesion of eustachian tube noted. Other: Negative. IMPRESSION: No intracranial hemorrhage or CT evidence of large acute infarct. 6 mm colloid cyst unchanged from 2014. Atrophy without hydrocephalus. Chronic microvascular changes. Partial opacification right sphenoid sinus. Opacification inferior aspect right mastoid air cells. Electronically Signed    By: Genia Del M.D.   On: 10/07/2016 14:28   Ct Abdomen Pelvis W Contrast  Result Date: 09/15/2016 CLINICAL DATA:  Severe abdominal pain, history of bladder cancer EXAM: CT ABDOMEN AND PELVIS WITH CONTRAST TECHNIQUE: Multidetector CT imaging of the abdomen and pelvis was performed using the standard protocol following bolus administration of intravenous contrast. CONTRAST:  100 mL Isovue 300 IV COMPARISON:  09/03/2016 FINDINGS: Lower chest: Mild subpleural reticulation at the lung bases. Hepatobiliary: The liver is within normal limits. Gallbladder is notable for a 14 mm layering gallstone (series 2/image 28). No associated inflammatory changes. Pancreas: Within normal limits. Spleen: Within normal limits. Adrenals/Urinary Tract: Adrenal glands are within normal limits. 1.5 cm lateral left upper pole renal  cyst. Right kidney is within normal limits. No hydronephrosis. 1.5 x 3.4 cm irregular enhancing mass along the left anterior bladder wall (series 2/ image 81) with additional underlying irregular wall thickening (series 2/ image 79), corresponding to the patient's known primary bladder neoplasm. Stomach/Bowel: Stomach is within normal limits. No evidence of bowel obstruction. Normal appendix (series 2/ image 45). Left colonic diverticulosis, without evidence of diverticulitis. Vascular/Lymphatic: No evidence of abdominal aortic aneurysm. Atherosclerotic calcifications of the abdominal aorta and branch vessels. No suspicious abdominopelvic lymphadenopathy. Reproductive: Mild prostatomegaly. Other: No abdominopelvic ascites. Small fat containing right inguinal hernia (series 2/ image 83). Musculoskeletal: Degenerative changes of the visualized thoracolumbar spine. IMPRESSION: Irregular enhancing mass along the left anterior bladder wall, corresponding to patient's known primary bladder neoplasm. Cholelithiasis, without associated inflammatory changes. No evidence of bowel obstruction.  Normal appendix. No CT  findings to account for the patient's abdominal pain. Additional stable ancillary findings as above. Electronically Signed   By: Julian Hy M.D.   On: 09/22/2016 18:55   Dg Chest Port 1 View  Result Date: 10/27/16 CLINICAL DATA:  Increased shortness of breath.  Hypotension. EXAM: PORTABLE CHEST 1 VIEW COMPARISON:  Yesterday at 1902 hour, 10/09/2016 FINDINGS: Volume loss in the right hemithorax with dense right basilar opacity. This is progressed from prior exam. Left basilar opacity again seen, with decreased haziness. Unchanged heart size and tortuous thoracic aorta. Persistent but decreased interstitial thickening. No pneumothorax. IMPRESSION: Dense right basilar opacity with possible volume loss, recommend correlation for aspiration. Left basilar haziness has improved. Persistent but improved interstitial thickening. Suspect decreased left pleural effusion from prior. Electronically Signed   By: Jeb Levering M.D.   On: 2016-10-27 03:38   Dg Chest Port 1 View  Result Date: 10/11/2016 CLINICAL DATA:  SOB. Hx of a-fib, CAD, COPD, dilated cardiomyopathy, hypertension, coronary stent placement, cardiac cath. EXAM: PORTABLE CHEST 1 VIEW COMPARISON:  10/09/2016 FINDINGS: There is dense opacification of the right hemithorax, increased since prior study. Increased opacity also identified at the left lung base. There are bilateral pleural effusions. The patient is slightly rotated towards the right. IMPRESSION: Significant increase in bilateral airspace filling and pleural effusions, right greater than left. Electronically Signed   By: Nolon Nations M.D.   On: 10/11/2016 19:35   Dg Knee Complete 4 Views Left  Result Date: 09/17/2016 CLINICAL DATA:  Generalized pain and swelling after falling down steps yesterday. EXAM: LEFT KNEE - COMPLETE 4+ VIEW COMPARISON:  Multiple radiographic and MRI exam same year FINDINGS: There is a large knee joint effusion. No acute fracture. No way compartment  joint space narrowing or patellofemoral osteoarthritis. Calcification in Hoffa's fat pad could be calcified fat necrosis or a chondroma as noted at MRI. IMPRESSION: No acute finding. Joint effusion which has been present on previous studies and felt secondary to synovitis. Benign calcification in Hoffa's fat pad that could be fat necrosis or a chondroma. Electronically Signed   By: Nelson Chimes M.D.   On: 09/17/2016 11:41   Dg Knee Complete 4 Views Right  Result Date: 09/17/2016 CLINICAL DATA:  [address] down stairs landing on the knee EXAM: RIGHT KNEE - COMPLETE 4+ VIEW COMPARISON:  03/04/2016 FINDINGS: Small knee joint effusion. No osteoarthritic joint space narrowing. No fracture. Regional vascular calcification incidentally noted. IMPRESSION: Small joint effusion.  Otherwise negative. Electronically Signed   By: Nelson Chimes M.D.   On: 09/17/2016 11:42   Dg Abd Acute W/chest  Result Date: 09/26/2016 CLINICAL DATA:  Patient states that he's  had severe abdominal pain since last night, denies vomiting/diarrhea EXAM: DG ABDOMEN ACUTE W/ 1V CHEST COMPARISON:  09/17/2016 previous FINDINGS: Heart size normal.  Atheromatous aorta. Lungs are clear. No effusion. No free air. Normal bowel gas pattern. There are no abnormal calcifications. Facet disease in the lower lumbar spine. IMPRESSION: No acute cardiopulmonary disease. Negative abdominal radiographs. Electronically Signed   By: Lucrezia Europe M.D.   On: 09/26/2016 15:35   US Abdomen Limited Ruq  Result Date: 10/05/2016 CLINICAL DATA:  Abdominal pain 2 days. EXAM: US ABDOMEN LIMITED - RIGHT UPPER QUADRANT COMPARISON:  CT 09/19/2016 and ultrasound 08/27/2016 FINDINGS: Gallbladder: Cholelithiasis with several stones over the dependent portion of the gallbladder/gallbladder neck. No wall thickening. Negative sonographic Murphy's sign. No adjacent free fluid. Common bile duct: Diameter: 2.9 mm. Liver: No focal lesion identified. Within normal limits in parenchymal  echogenicity. IMPRESSION: Cholelithiasis without additional sonographic evidence to suggest cholecystitis. Electronically Signed   By: Marin Olp M.D.   On: 10/05/2016 10:34    Time Spent in minutes  30   SINGH,PRASHANT K M.D on Oct 14, 2016 at 11:26 AM  Between 7am to 7pm - Pager - [phone]  After 7pm go to [email or web address] - password [facility]  [facility] -  Office  [phone]

## 2016-10-14 NOTE — Progress Notes (Signed)
Collected urine for urinalysis and culture and lab called and stated that it was not enough urine for both. She stated that she can do the culture, but not enough urine for urinalysis. Writer asked lab to go ahead with culture and will place another order for urinalysis. Pt has a condom catheter. Writer clamped tubing to obtain another specimen. Will continue to monitor.

## 2016-10-14 NOTE — Progress Notes (Signed)
Messaged Dr. Glenis Smoker regarding critical labs of lactic acid 2.5 and troponin 0.11.

## 2016-10-14 NOTE — Progress Notes (Signed)
Unable to obtain ABG due to decline in patients vitals. Settings on BiPAP increased due to decrease in RR and VT. Per family's request patient in now DNR.

## 2016-10-14 NOTE — Consult Note (Signed)
Consult requested by: [facility] Consult requested for respiratory failure:  HPI: This is an 83 year old who came to the hospital on the 22nd of this month with abdominal pain. At baseline he has a history of dementia of bladder cancer atrial fib cardiomyopathy coronary disease COPD hypertension hypothyroidism. His abdominal pain had been described as diffuse nonradiating abdominal pain. He did not have diarrhea constipation nausea or vomiting. He was admitted and workup and treatment were started. Apparently yesterday afternoon he aspirated has developed acute respiratory failure was septic and transferred to the intensive care unit. He is on BiPAP and 100% oxygen. He is poorly responsive. He has been hypotensive but that's better. No other complaints are able to be elicited now because of his poor responsiveness. He has been febrile.  Past Medical History:  Diagnosis Date  . Atrial fibrillation (HCC)    Persistent, treated with amiodarone and cardioversion  . CAD (coronary artery disease)    Last catheterization in August 2008 demonstrated the LAD 40% stenosis  . Cholecystitis   . COPD (chronic obstructive pulmonary disease) ([place])   . Dilated cardiomyopathy ([place])    Felt to be secondary to atrial fibrillation. EF is about 40%  . Drug therapy    Chronic Coumadin therapy  . Dyslipidemia   . Hypertension   . Hypothyroidism    Possibly related to amiodarone     Family History  Problem Relation Age of Onset  . Adopted: Yes  . Heart attack Mother   . Heart attack Father   . Diabetes Other   . Stroke Other      Social History   Social History  . Marital status: Widowed    Spouse name: N/A  . Number of children: N/A  . Years of education: N/A   Occupational History  . Part Time    Social History Main Topics  . Smoking status: Never Smoker  . Smokeless tobacco: Never Used  . Alcohol use No     Comment: quit at age 7yo  . Drug use: No  . Sexual activity: No    Other Topics Concern  . None   Social History Narrative   Widowed   Gets regular exercise     ROS: Not obtainable at this point    Objective: Vital signs in last 24 hours: Temp:  [98.1 F (36.7 C)-102.7 F (39.3 C)] 98.1 F (36.7 C) (12/30 0400) Pulse Rate:  [88-115] 98 (12/30 0745) Resp:  [20-35] 25 (12/30 0745) BP: (66-143)/(47-94) 83/56 (12/30 0745) SpO2:  [82 %-94 %] 90 % (12/30 0826) FiO2 (%):  [100 %] 100 % (12/30 0826) Weight:  [72.2 kg (159 lb 2.8 oz)-72.3 kg (159 lb 6.3 oz)] 72.3 kg (159 lb 6.3 oz) (12/30 0400) Weight change: -4.3 kg (-9 lb 7.7 oz) Last BM Date: 10/09/16  Intake/Output from previous day: 12/29 0701 - 12/30 0700 In: 295.8 [P.O.:120; IV Piggyback:175.8] Out: 1600 [Urine:1600]  PHYSICAL EXAM Constitutional: He is a thin male poorly responsive on BiPAP. Eyes: Pupils respond ears nose mouth and throat: Mucous membranes are dry cardiovascular: His heart is irregularly irregular with tachycardia at about 110 no edema. Respiratory: His respiratory effort and rate are increased. He has rales in the right base and rhonchi bilaterally gastrointestinal: His abdomen is soft without masses skin: Cool to touch neurological no focal abnormalities but he is poorly responsive psychiatric: Unable to assess  Lab Results: Basic Metabolic Panel:  Recent Labs  10/11/16 0548 10/23/16 0320 10/23/16 0515  NA 133* 134*  --  K 3.0* 4.2  --   CL 93* 94*  --   CO2 30 28  --   GLUCOSE 162* 227*  --   BUN 17 33*  --   CREATININE 1.01 2.65*  --   CALCIUM 8.9 9.1  --   MG  --   --  2.3   Liver Function Tests:  Recent Labs  October 16, 2016 0320  AST 17  ALT 11*  ALKPHOS 52  BILITOT 1.0  PROT 7.3  ALBUMIN 3.4*   No results for input(s): LIPASE, AMYLASE in the last 72 hours. No results for input(s): AMMONIA in the last 72 hours. CBC:  Recent Labs  Oct 16, 2016 0320  WBC 10.5  NEUTROABS 8.3*  HGB 16.1  HCT 50.5  MCV 80.8  PLT 434*   Cardiac  Enzymes:  Recent Labs  10/16/16 0320  TROPONINI 0.11*   BNP: No results for input(s): PROBNP in the last 72 hours. D-Dimer: No results for input(s): DDIMER in the last 72 hours. CBG:  Recent Labs  Oct 16, 2016 0153  GLUCAP 214*   Hemoglobin A1C: No results for input(s): HGBA1C in the last 72 hours. Fasting Lipid Panel: No results for input(s): CHOL, HDL, LDLCALC, TRIG, CHOLHDL, LDLDIRECT in the last 72 hours. Thyroid Function Tests: No results for input(s): TSH, T4TOTAL, FREET4, T3FREE, THYROIDAB in the last 72 hours. Anemia Panel: No results for input(s): VITAMINB12, FOLATE, FERRITIN, TIBC, IRON, RETICCTPCT in the last 72 hours. Coagulation:  Recent Labs  10/11/16 0548 10-16-2016 0515  LABPROT 31.5* 34.1*  INR 2.96 3.27   Urine Drug Screen: Drugs of Abuse  No results found for: LABOPIA, COCAINSCRNUR, LABBENZ, AMPHETMU, THCU, LABBARB  Alcohol Level: No results for input(s): ETH in the last 72 hours. Urinalysis: No results for input(s): COLORURINE, LABSPEC, PHURINE, GLUCOSEU, HGBUR, BILIRUBINUR, KETONESUR, PROTEINUR, UROBILINOGEN, NITRITE, LEUKOCYTESUR in the last 72 hours.  Invalid input(s): APPERANCEUR Misc. Labs:   ABGS:  Recent Labs  10/11/16 1926  PHART 7.468*  PO2ART 57.3*  HCO3 30.4*     MICROBIOLOGY: Recent Results (from the past 240 hour(s))  Microscopic Examination     Status: Abnormal   Collection Time: 09/28/2016  3:56 PM  Result Value Ref Range Status   WBC, UA None seen 0 - 5 /hpf Final   RBC, UA 3-10 (A) 0 - 2 /hpf Final   Epithelial Cells (non renal) 0-10 0 - 10 /hpf Final   Renal Epithel, UA 0-10 (A) None seen /hpf Final   Casts Present None seen /lpf Final   Cast Type Hyaline casts N/A Final   Mucus, UA Present Not Estab. Final   Bacteria, UA None seen None seen/Few Final  Urine culture     Status: None   Collection Time: 10/11/2016  6:54 PM  Result Value Ref Range Status   Specimen Description URINE, RANDOM  Final   Special Requests  NONE  Final   Culture NO GROWTH Performed at [facility]   Final   Report Status 10/07/2016 FINAL  Final  MRSA PCR Screening     Status: None   Collection Time: 10/11/16  9:45 PM  Result Value Ref Range Status   MRSA by PCR NEGATIVE NEGATIVE Final    Comment:        The GeneXpert MRSA Assay (FDA approved for NASAL specimens only), is one component of a comprehensive MRSA colonization surveillance program. It is not intended to diagnose MRSA infection nor to guide or monitor treatment for MRSA infections.     Studies/Results:  Dg Chest Port 1 View  Result Date: 2016-11-04 CLINICAL DATA:  Increased shortness of breath.  Hypotension. EXAM: PORTABLE CHEST 1 VIEW COMPARISON:  Yesterday at 1902 hour, 10/09/2016 FINDINGS: Volume loss in the right hemithorax with dense right basilar opacity. This is progressed from prior exam. Left basilar opacity again seen, with decreased haziness. Unchanged heart size and tortuous thoracic aorta. Persistent but decreased interstitial thickening. No pneumothorax. IMPRESSION: Dense right basilar opacity with possible volume loss, recommend correlation for aspiration. Left basilar haziness has improved. Persistent but improved interstitial thickening. Suspect decreased left pleural effusion from prior. Electronically Signed   By: Jeb Levering M.D.   On: 11-04-2016 03:38   Dg Chest Port 1 View  Result Date: 10/11/2016 CLINICAL DATA:  SOB. Hx of a-fib, CAD, COPD, dilated cardiomyopathy, hypertension, coronary stent placement, cardiac cath. EXAM: PORTABLE CHEST 1 VIEW COMPARISON:  10/09/2016 FINDINGS: There is dense opacification of the right hemithorax, increased since prior study. Increased opacity also identified at the left lung base. There are bilateral pleural effusions. The patient is slightly rotated towards the right. IMPRESSION: Significant increase in bilateral airspace filling and pleural effusions, right greater than left. Electronically  Signed   By: Nolon Nations M.D.   On: 10/11/2016 19:35    Medications:  Prior to Admission:  Prescriptions Prior to Admission  Medication Sig Dispense Refill Last Dose  . acetaminophen (TYLENOL) 500 MG tablet Take 1-2 tablets (500-1,000 mg total) by mouth 2 (two) times daily as needed. (Patient taking differently: Take 500-1,000 mg by mouth 2 (two) times daily as needed for mild pain. ) 30 tablet 0 Past Month at Unknown time  . donepezil (ARICEPT) 10 MG tablet Take 1 tablet (10 mg total) by mouth at bedtime. To help with memory and to help think more clearly 30 tablet 2 10/03/2016 at Unknown time  . [DISCONTINUED] warfarin (COUMADIN) 5 MG tablet Take 5 mg once daily except on Monday and Friday take 7.5 mg. (Patient taking differently: Take 5 mg once daily) 32 tablet 2 10/03/2016 at Unknown time  . amLODipine (NORVASC) 10 MG tablet TAKE 1 TABLET DAILY 30 tablet 10 09/28/2016  . carvedilol (COREG) 12.5 MG tablet Take 0.5 tablets (6.25 mg total) by mouth 2 (two) times daily with a meal. 30 tablet 0 10/07/2016  . digoxin (LANOXIN) 0.125 MG tablet Take 1 tablet (125 mcg total) by mouth daily. 30 tablet 2 09/18/2016  . DULoxetine (CYMBALTA) 30 MG capsule Take 1 capsule (30 mg total) by mouth daily. For arthritis pain in the knees 30 capsule 2 09/19/2016  . escitalopram (LEXAPRO) 5 MG tablet Take 5 mg by mouth daily.    10/03/2016  . fenofibrate micronized (LOFIBRA) 134 MG capsule TAKE (1) CAPSULE DAILY BEFORE BREAKFAST. 90 capsule 0 10/03/2016  . furosemide (LASIX) 20 MG tablet TAKE (1) TABLET DAILY IN THE MORNING. 30 tablet 5 09/24/2016  . gabapentin (NEURONTIN) 600 MG tablet Take 1 tablet (600 mg total) by mouth 3 (three) times daily. 90 tablet 2 09/15/2016  . HYDROcodone-acetaminophen (NORCO/VICODIN) 5-325 MG tablet Take one every 6-8 hours for pain not helped by tylenol alone 20 tablet 0 unknown  . levothyroxine (SYNTHROID, LEVOTHROID) 125 MCG tablet TAKE 1 TABLET DAILY 30 tablet 10 10/11/2016   . LORazepam (ATIVAN) 0.5 MG tablet Take 1 tablet (0.5 mg total) by mouth 2 (two) times daily as needed for anxiety. 30 tablet 1 10/01/2016  . NITROSTAT 0.4 MG SL tablet Place 1 tablet (0.4 mg total) under the tongue every 5 (five)  minutesas needed for chest pain. 25 tablet 0 unknown  . polyethylene glycol powder (GLYCOLAX/MIRALAX) powder Take 17 g by mouth daily as needed. For constipation 3350 g 5 unknown  . tamsulosin (FLOMAX) 0.4 MG CAPS capsule Take 2 capsules (0.8 mg total) by mouth daily after supper. For urine flow and prostate 60 capsule 3 09/19/2016   Scheduled: . ipratropium-albuterol  3 mL Nebulization Q6H  . levothyroxine  50 mcg Intravenous Daily  . meropenem (MERREM) IV  1 g Intravenous Q12H  . sodium chloride  250 mL Intravenous Once  . tamsulosin  0.8 mg Oral QPC supper  . Warfarin - Pharmacist Dosing Inpatient   Does not apply Q24H   Continuous: . sodium chloride 75 mL/hr at 10/28/2016 0725  . esmolol     [id number]  Assesment:He has aspirated. Chest x-ray is consistent with this. His episode occurred while he was eating. He is septic and being treated for that with IV fluids. Blood cultures are pending. He has appropriate antibiotic treatment at this time. He is on BiPAP and is full code at this point although his daughter who has power of attorney is not here now. He is high risk of requiring intubation and mechanical ventilation if that is the wishes of the family because he is marginally oxygenating on BiPAP and 100% oxygen. Principal Problem:   Supratherapeutic INR Active Problems:   Hypertensive heart and renal disease with renal failure   Paroxysmal atrial fibrillation (HCC)   Dementia   Cholelithiasis   Abdominal pain   Coagulopathy (HCC)   Leg weakness, bilateral    Plan: Add moderate dose steroids. Meropenem should be good coverage for aspiration. Continue BiPAP for now.    LOS: 5 days   Abimbola Aki L Oct 28, 2016, 8:47 AM

## 2016-10-14 NOTE — Progress Notes (Signed)
CRITICAL VALUE ALERT  Critical value received:  Lactic 2.6  Date of notification:  10/30/16  Time of notification:  0945

## 2016-10-14 NOTE — Progress Notes (Signed)
[place] for Warfarin Indication: atrial fibrillation  Allergies  Allergen Reactions  . Amiodarone     Neuropathy  . Penicillins Itching    Has patient had a PCN reaction causing immediate rash, facial/tongue/throat swelling, SOB or lightheadedness with hypotension: Yesunknown Has patient had a PCN reaction causing severe rash involving mucus membranes or skin necrosis: unknown Has patient had a PCN reaction that required hospitalization Yesno  If all of the above answers are "NO", then may proceed with Ceph   Patient Measurements: Height: 5\' 3"  (160 cm) Weight: 159 lb 6.3 oz (72.3 kg) IBW/kg (Calculated) : 56.9  Vital Signs: Temp: 98.1 F (36.7 C) (12/30 0400) Temp Source: Oral (12/30 0400) BP: 83/56 (12/30 0745) Pulse Rate: 98 (12/30 0745)  Labs:  Recent Labs  10/10/16 0617 10/11/16 0548 10-Nov-2016 0320 11/10/2016 0515  HGB  --   --  16.1  --   HCT  --   --  50.5  --   PLT  --   --  434*  --   LABPROT 29.5* 31.5*  --  34.1*  INR 2.73 2.96  --  3.27  CREATININE  --  1.01 2.65*  --   TROPONINI  --   --  0.11*  --    Estimated Creatinine Clearance: 17.2 mL/min (by C-G formula based on SCr of 2.65 mg/dL (H)).  Medical History: Past Medical History:  Diagnosis Date  . Atrial fibrillation (HCC)    Persistent, treated with amiodarone and cardioversion  . CAD (coronary artery disease)    Last catheterization in August 2008 demonstrated the LAD 40% stenosis  . Cholecystitis   . COPD (chronic obstructive pulmonary disease) ([place])   . Dilated cardiomyopathy ([place])    Felt to be secondary to atrial fibrillation. EF is about 40%  . Drug therapy    Chronic Coumadin therapy  . Dyslipidemia   . Hypertension   . Hypothyroidism    Possibly related to amiodarone   Medications:  Prescriptions Prior to Admission  Medication Sig Dispense Refill Last Dose  . acetaminophen (TYLENOL) 500 MG tablet Take 1-2 tablets (500-1,000 mg total) by  mouth 2 (two) times daily as needed. (Patient taking differently: Take 500-1,000 mg by mouth 2 (two) times daily as needed for mild pain. ) 30 tablet 0 Past Month at Unknown time  . donepezil (ARICEPT) 10 MG tablet Take 1 tablet (10 mg total) by mouth at bedtime. To help with memory and to help think more clearly 30 tablet 2 10/03/2016 at Unknown time  . [DISCONTINUED] warfarin (COUMADIN) 5 MG tablet Take 5 mg once daily except on Monday and Friday take 7.5 mg. (Patient taking differently: Take 5 mg once daily) 32 tablet 2 10/03/2016 at Unknown time  . amLODipine (NORVASC) 10 MG tablet TAKE 1 TABLET DAILY 30 tablet 10 09/27/2016  . carvedilol (COREG) 12.5 MG tablet Take 0.5 tablets (6.25 mg total) by mouth 2 (two) times daily with a meal. 30 tablet 0 09/21/2016  . digoxin (LANOXIN) 0.125 MG tablet Take 1 tablet (125 mcg total) by mouth daily. 30 tablet 2 09/14/2016  . DULoxetine (CYMBALTA) 30 MG capsule Take 1 capsule (30 mg total) by mouth daily. For arthritis pain in the knees 30 capsule 2 09/27/2016  . escitalopram (LEXAPRO) 5 MG tablet Take 5 mg by mouth daily.    10/02/2016  . fenofibrate micronized (LOFIBRA) 134 MG capsule TAKE (1) CAPSULE DAILY BEFORE BREAKFAST. 90 capsule 0 09/22/2016  . furosemide (LASIX) 20  MG tablet TAKE (1) TABLET DAILY IN THE MORNING. 30 tablet 5 10/08/2016  . gabapentin (NEURONTIN) 600 MG tablet Take 1 tablet (600 mg total) by mouth 3 (three) times daily. 90 tablet 2 10/09/2016  . HYDROcodone-acetaminophen (NORCO/VICODIN) 5-325 MG tablet Take one every 6-8 hours for pain not helped by tylenol alone 20 tablet 0 unknown  . levothyroxine (SYNTHROID, LEVOTHROID) 125 MCG tablet TAKE 1 TABLET DAILY 30 tablet 10 10/03/2016  . LORazepam (ATIVAN) 0.5 MG tablet Take 1 tablet (0.5 mg total) by mouth 2 (two) times daily as needed for anxiety. 30 tablet 1 09/24/2016  . NITROSTAT 0.4 MG SL tablet Place 1 tablet (0.4 mg total) under the tongue every 5 (five) minutesas needed for chest  pain. 25 tablet 0 unknown  . polyethylene glycol powder (GLYCOLAX/MIRALAX) powder Take 17 g by mouth daily as needed. For constipation 3350 g 5 unknown  . tamsulosin (FLOMAX) 0.4 MG CAPS capsule Take 2 capsules (0.8 mg total) by mouth daily after supper. For urine flow and prostate 60 capsule 3 10/02/2016   Assessment: Continuation of warfarin PTA for AFIB. INR has trended up to SUPRAtherapeutic level. Pt was given Vit K on 12/22 and 12/23.  Appears pt is more sensitive now and may not require as much coumadin as previously needed. No bleeding noted. Will hold coumadin today  Goal of Therapy:  INR 2-3 Monitor platelets by anticoagulation protocol: Yes   Plan:  HOLD coumadin today INR/PT daily Monitor for signs of bleeding  Hart Robinsons, PharmD Clinical Pharmacist Pager:  [phone] November 04, 2016

## 2016-10-14 NOTE — Progress Notes (Signed)
Dr. Glenis Smoker called back and stated to just monitor for now.

## 2016-10-14 DEATH — deceased

## 2016-10-17 LAB — CULTURE, BLOOD (ROUTINE X 2)
Culture: NO GROWTH
Culture: NO GROWTH

## 2016-10-23 ENCOUNTER — Encounter (HOSPITAL_COMMUNITY): Payer: Medicare Other

## 2016-10-25 ENCOUNTER — Other Ambulatory Visit: Payer: Self-pay

## 2016-10-25 ENCOUNTER — Ambulatory Visit (HOSPITAL_COMMUNITY): Admission: RE | Admit: 2016-10-25 | Payer: Medicare Other | Source: Ambulatory Visit | Admitting: Urology

## 2016-10-25 ENCOUNTER — Other Ambulatory Visit: Payer: Self-pay | Admitting: Pharmacist

## 2016-10-25 SURGERY — TURBT (TRANSURETHRAL RESECTION OF BLADDER TUMOR)
Anesthesia: General

## 2016-10-25 NOTE — Patient Outreach (Signed)
[facility]) Care Management  10/25/2016  Zi Waterman 15-Mar-1926 [id number]  [place] consulted for medication adherence.  Per chart review patient deceased.  Pharmacy will close case as consumer deceased.    Bennye Alm, PharmD, [place] PGY2 Pharmacy Resident [phone]

## 2016-10-25 NOTE — Patient Outreach (Signed)
[facility]) Care Management  10/25/2016  Ceferino Blumenstock [birth year] [id number]   Case closed.  [facility] notified - patient deceased.   Nathaneil Canary, BSN, RN, [place] Care Management Care Management Coordinator [phone] Direct [phone] Cell [phone] Office [phone] Fax [email or web address]

## 2016-12-01 IMAGING — DX DG CHEST 2V
2 series · 2 of 2 positions shown · non-contrast
Comparison: April 01, 2016

CLINICAL DATA: Weakness with generalized aching sensation.
Hypertension.

EXAM:
CHEST  2 VIEW

[chest lat]
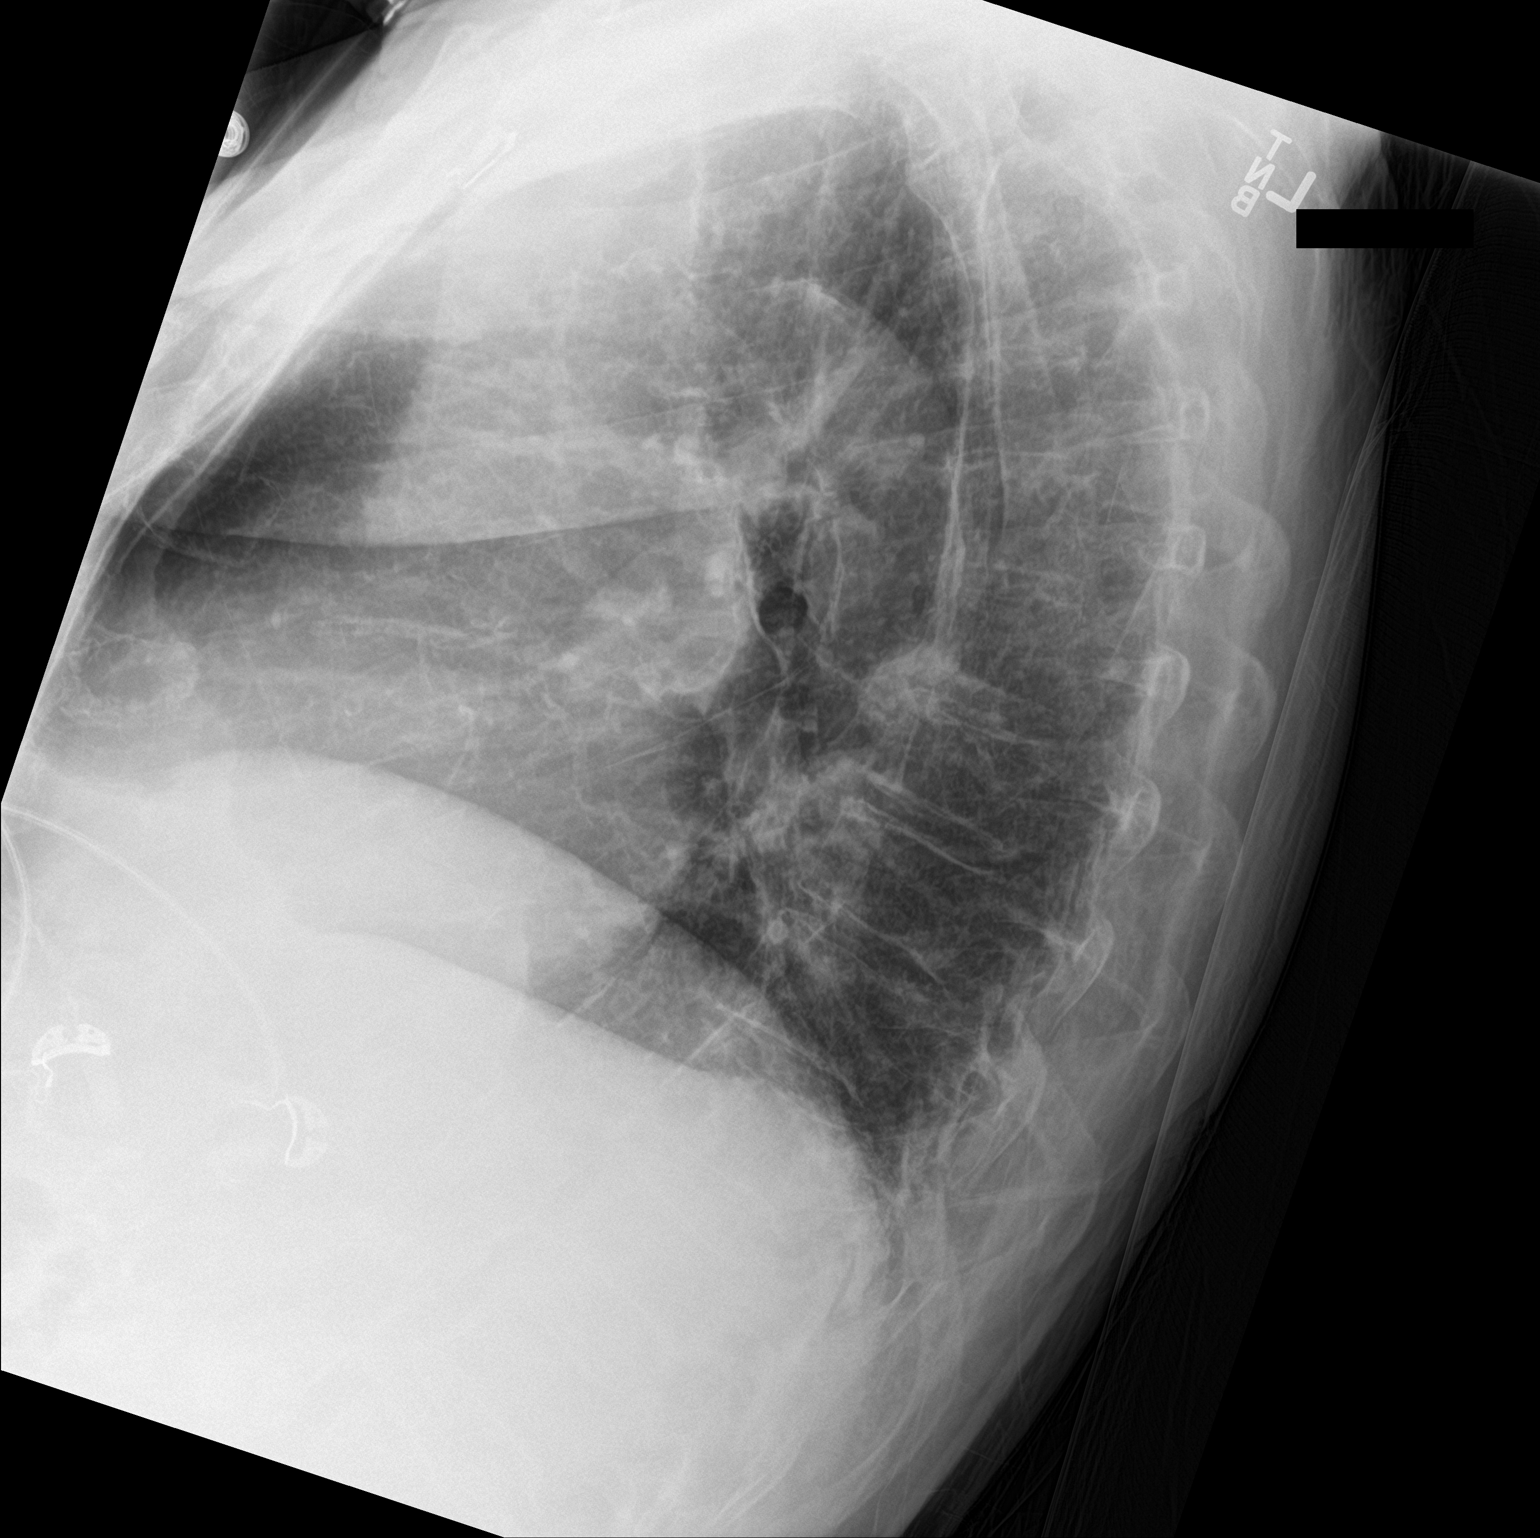

[chest ap]
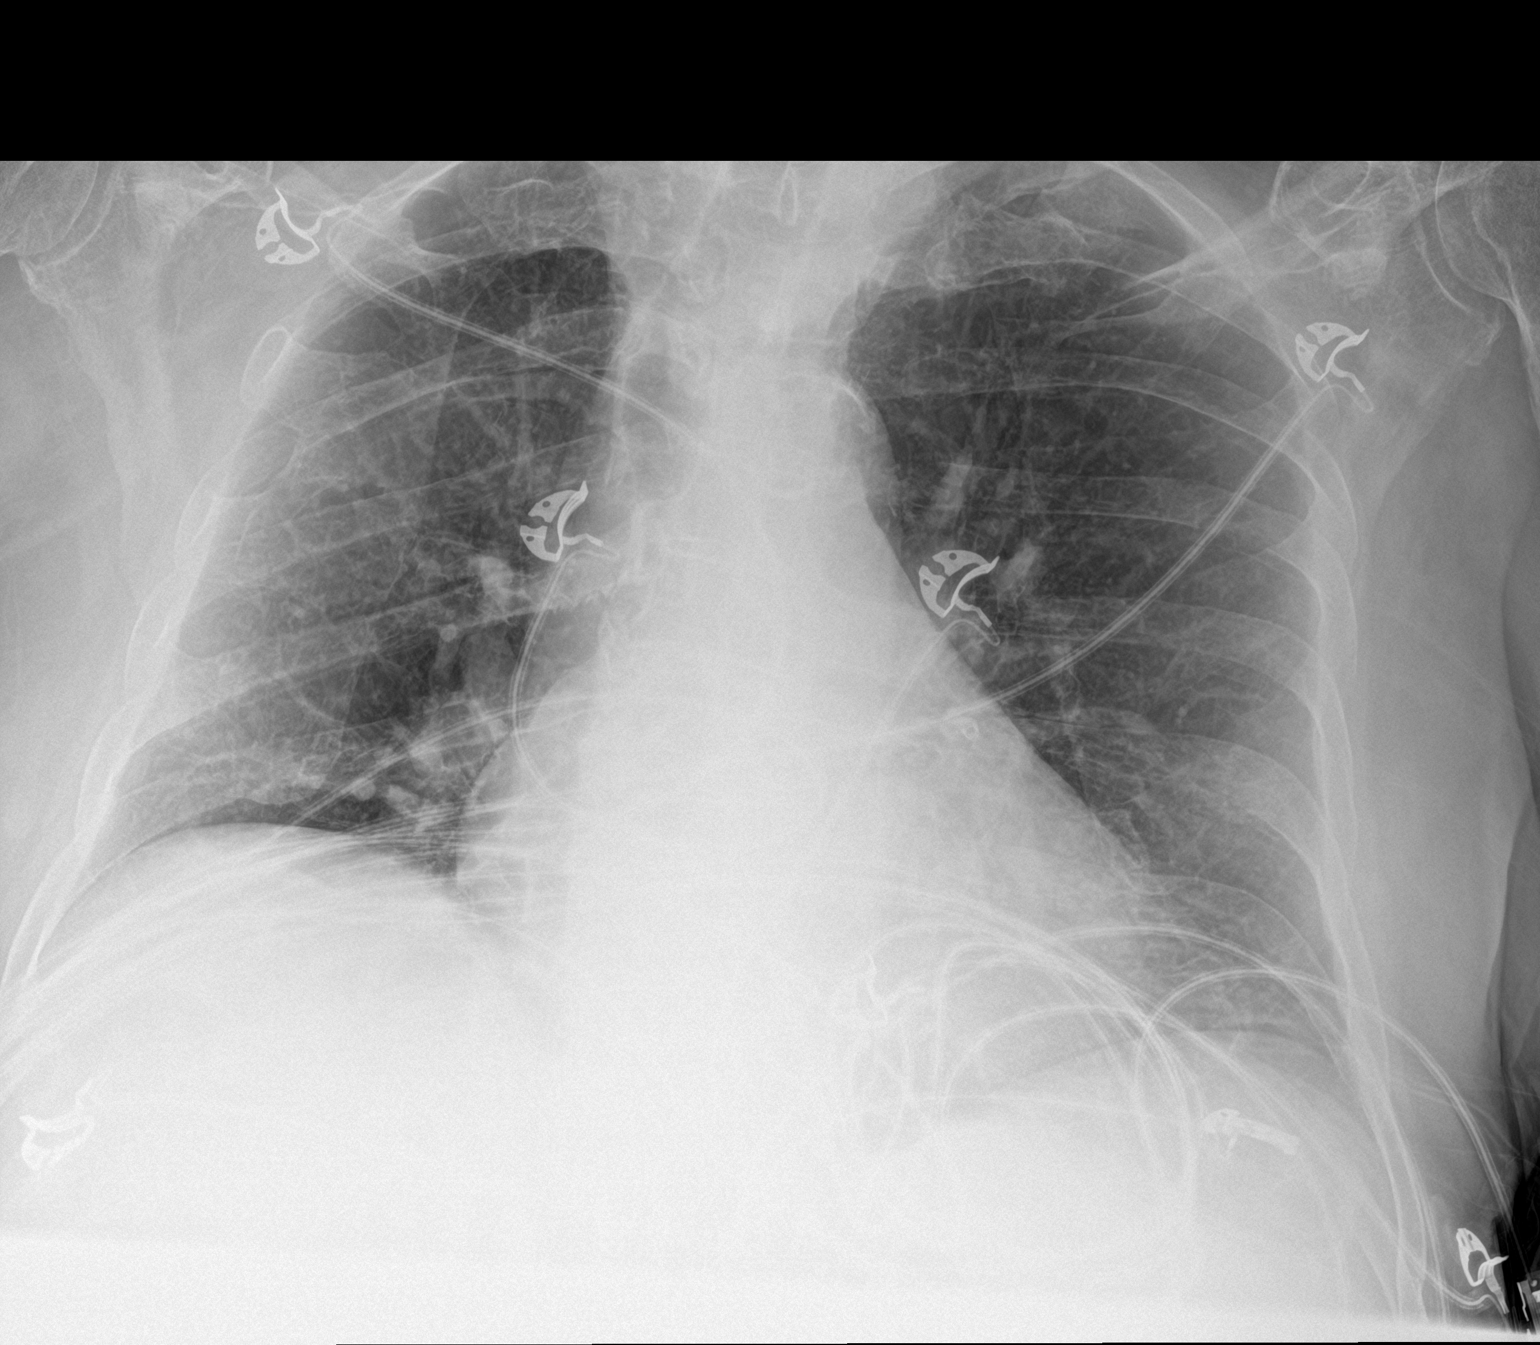

[2 of 2 positions shown; findings below may reference images not displayed]

FINDINGS: There is no edema or consolidation. Heart is borderline enlarged
with pulmonary vascularity within normal limits. There is
atherosclerotic calcification throughout the aorta. No adenopathy.
There is degenerative change in each shoulder and in the thoracic
spine.
IMPRESSION: Aortic atherosclerosis. Heart borderline enlarged. No edema or
consolidation.

## 2017-03-11 ENCOUNTER — Ambulatory Visit: Payer: Self-pay | Admitting: Pharmacist

## 2017-05-09 IMAGING — CT CT HEAD W/O CM
3 series · 16 of 47 positions shown, 19 images · non-contrast
Comparison: 01/28/2016 and 10/05/2013 head CT.

CLINICAL DATA: 89-year-old hypertensive male with dilated
cardiomyopathy presenting with bilateral leg weakness and unable to
walk since 10/04/2016. Initial encounter.

EXAM:
CT HEAD WITHOUT CONTRAST
TECHNIQUE: Contiguous axial images were obtained from the base of the skull
through the vertex without intravenous contrast.

[Series 2: head wo · axial · 0.43mm/px · z∈[+1241,+1366]mm · 10 of 31 slices shown, 13 images]
[im 3/31  brain]
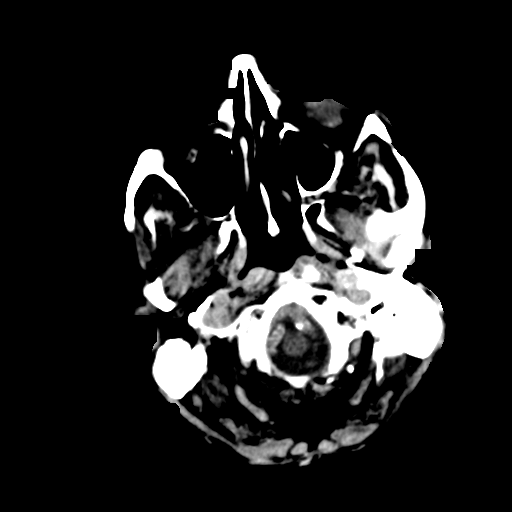
[im 3/31  bone]
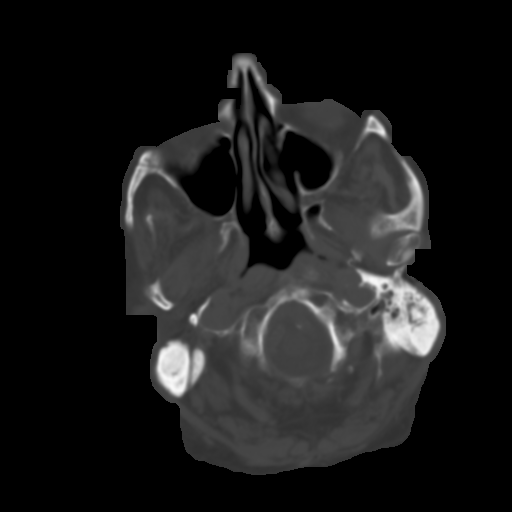
[im 6/31  brain]
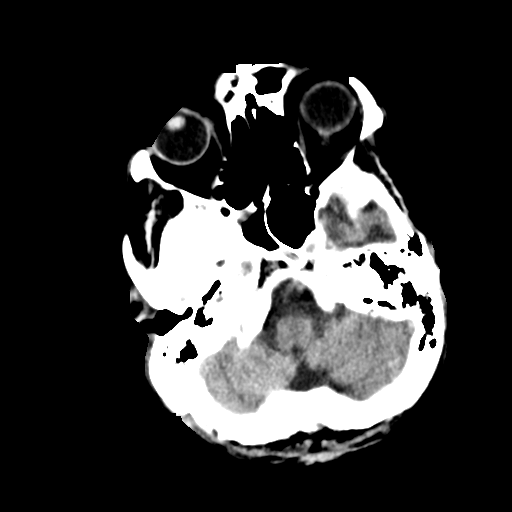
[im 9/31  brain]
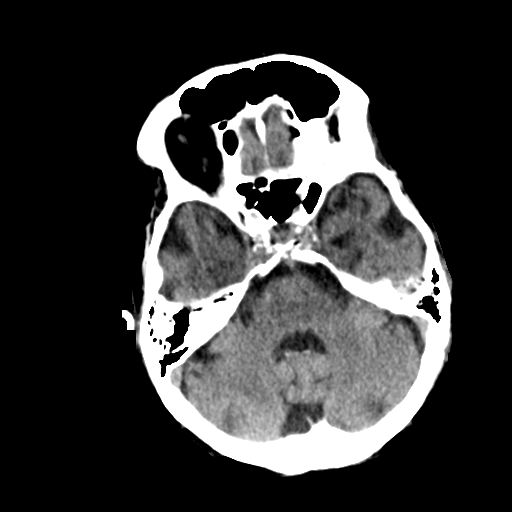
[im 11/31  brain]
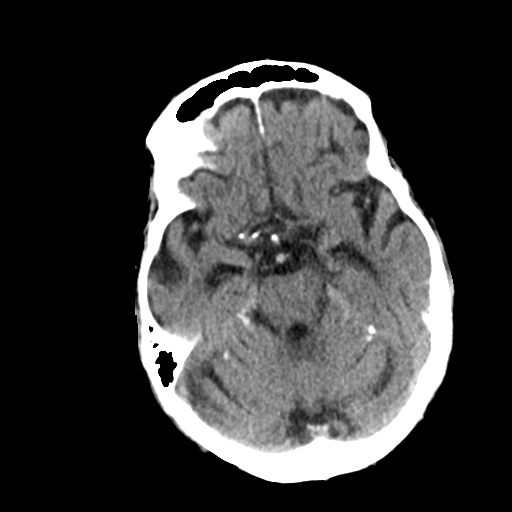
[im 14/31  brain]
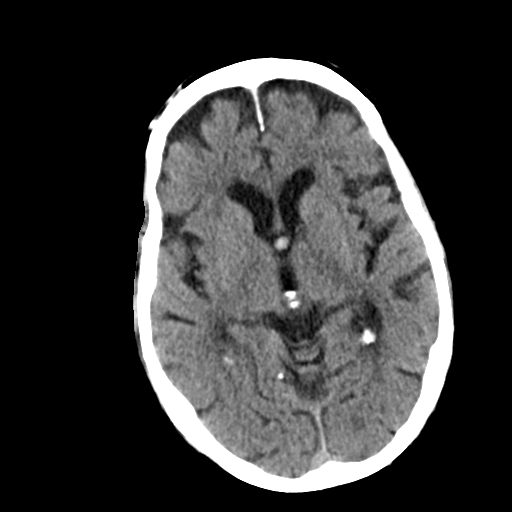
[im 14/31  bone]
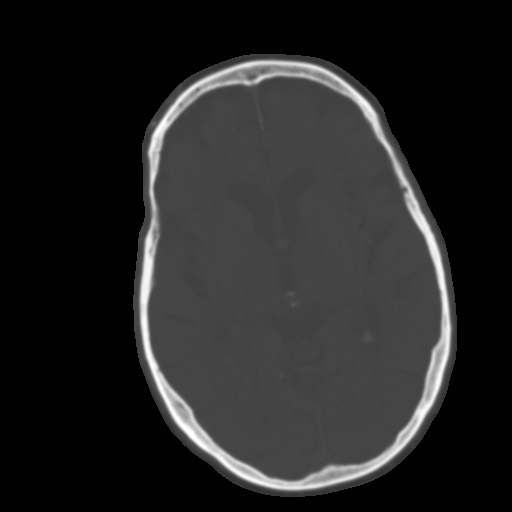
[im 17/31  brain]
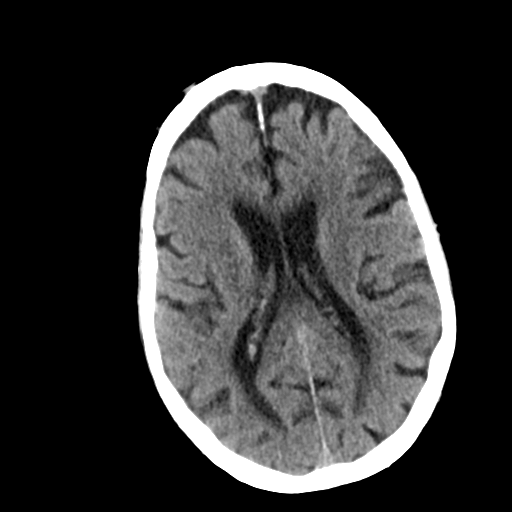
[im 20/31  brain]
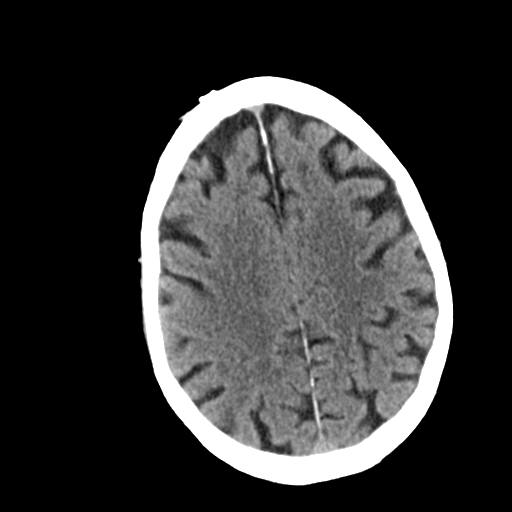
[im 23/31  brain]
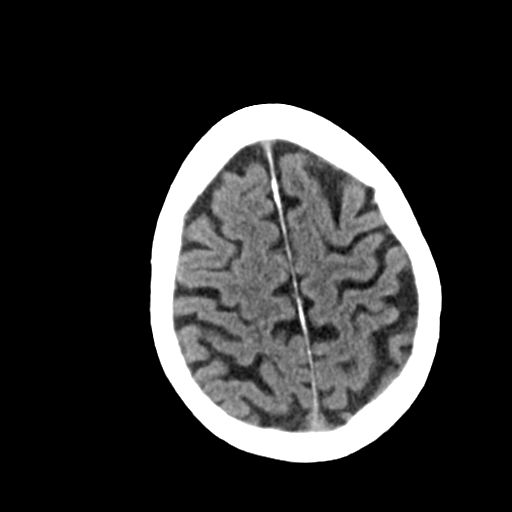
[im 25/31  brain]
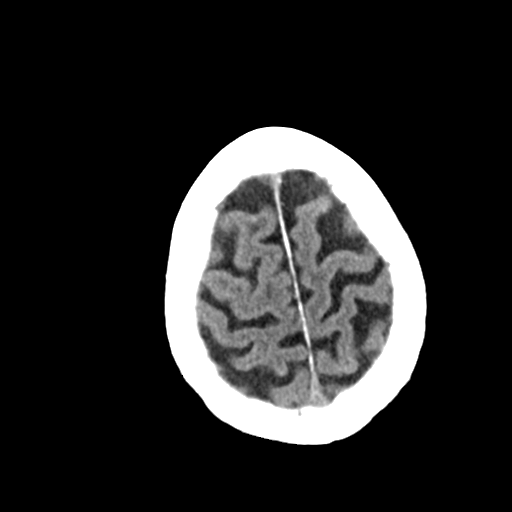
[im 25/31  bone]
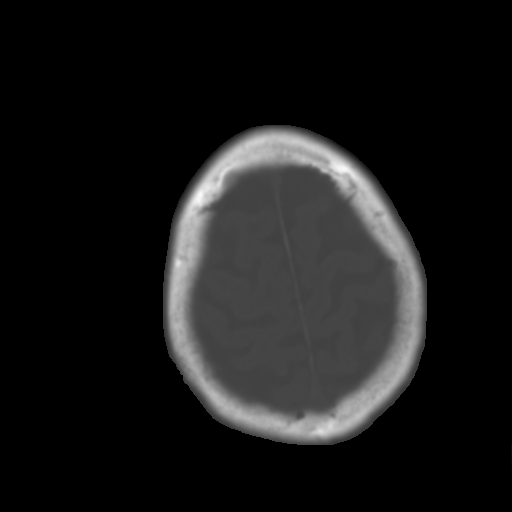
[im 28/31  brain]
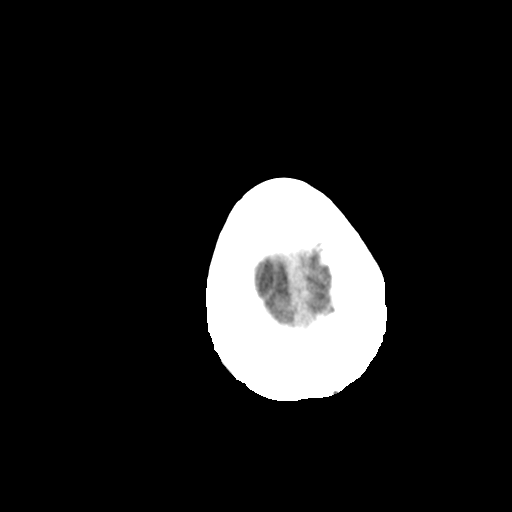

[Series 4: coronal soft tissue · coronal · 0.33mm/px · 3 of 79 slices shown]
[im 27/79  brain]
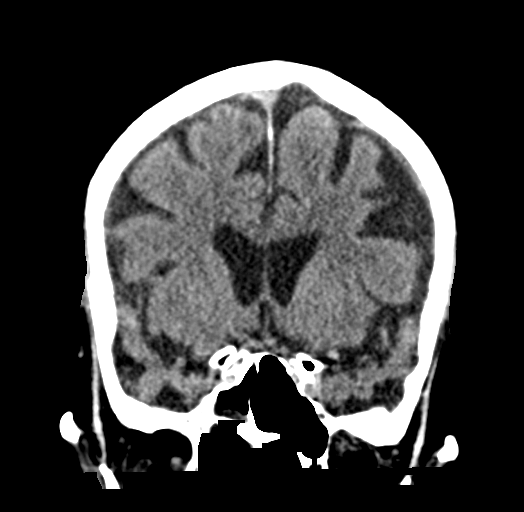
[im 35/79  brain]
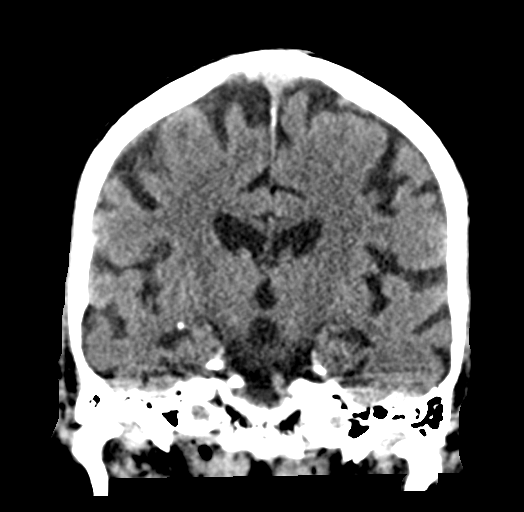
[im 44/79  brain]
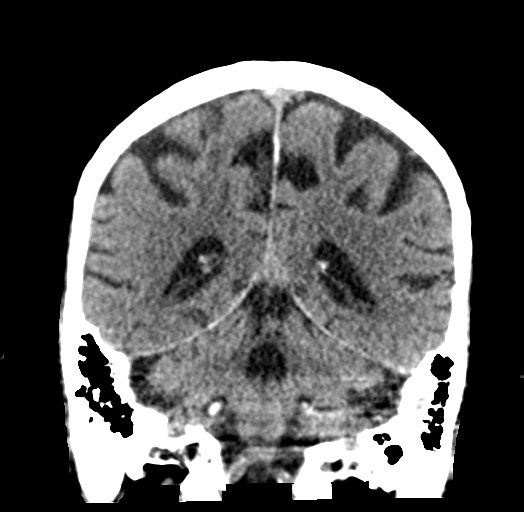

[Series 5: sagittal soft tissue · sagittal · 0.38mm/px · 3 of 56 slices shown]
[im 19/56  brain]
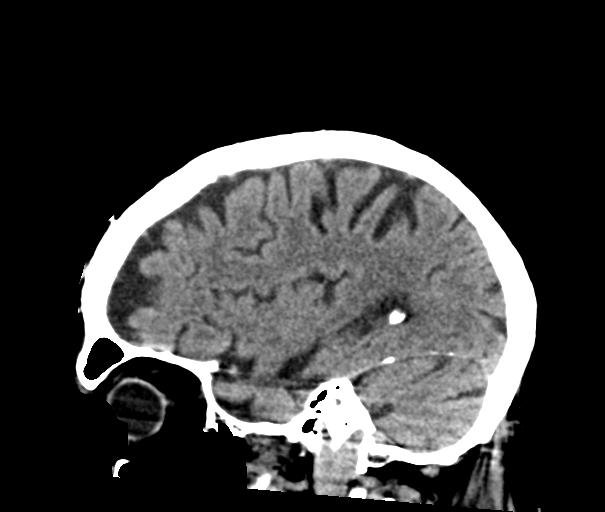
[im 28/56  brain]
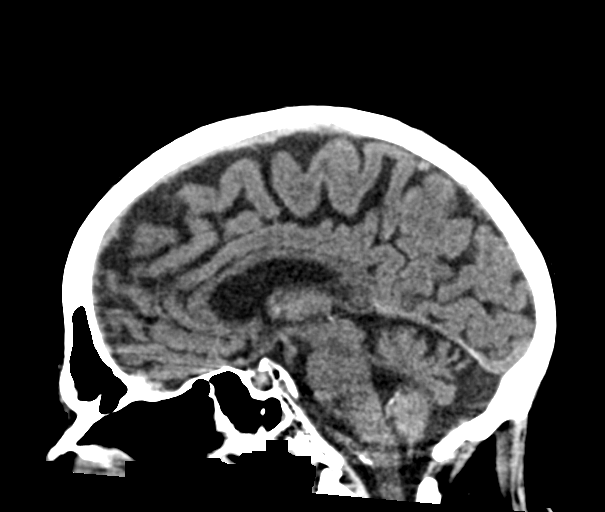
[im 37/56  brain]
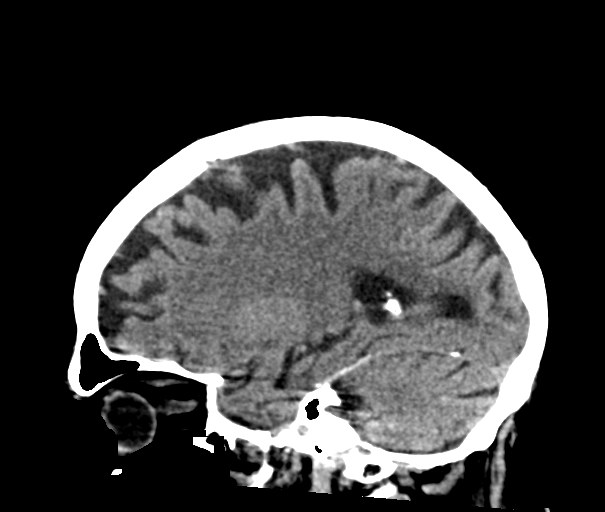

[16 of 47 positions shown; findings below may reference images not displayed]

FINDINGS: Brain: No intracranial hemorrhage or CT evidence of large acute
infarct.

6 mm colloid cyst unchanged from 1126.

Moderate atrophy without hydrocephalus.

Chronic microvascular changes.

Vascular: Vascular calcifications with ectasia. Slightly hyperdense
appearance middle cerebral artery branch vessels more notable on the
left unchanged.

Skull: No skull fracture.

Sinuses/Orbits: No acute orbital abnormality.

Partial opacification right sphenoid sinus. Opacification inferior
aspect right mastoid air cells without obstructing lesion of
eustachian tube noted.

Other: Negative.
IMPRESSION: No intracranial hemorrhage or CT evidence of large acute infarct.

6 mm colloid cyst unchanged from 1126. Atrophy without
hydrocephalus.

Chronic microvascular changes.

Partial opacification right sphenoid sinus. Opacification inferior
aspect right mastoid air cells.

## 2017-05-11 IMAGING — US US ABDOMEN LIMITED
1 series · 14 of 25 positions shown · non-contrast
Comparison: CT 10/04/2016 and ultrasound 08/27/2016

CLINICAL DATA: Abdominal pain 2 days.

EXAM:
US ABDOMEN LIMITED - RIGHT UPPER QUADRANT

[Series 1: us abdomen limited · 0.22mm/px · 14 of 64 slices shown]
[im 1/64]
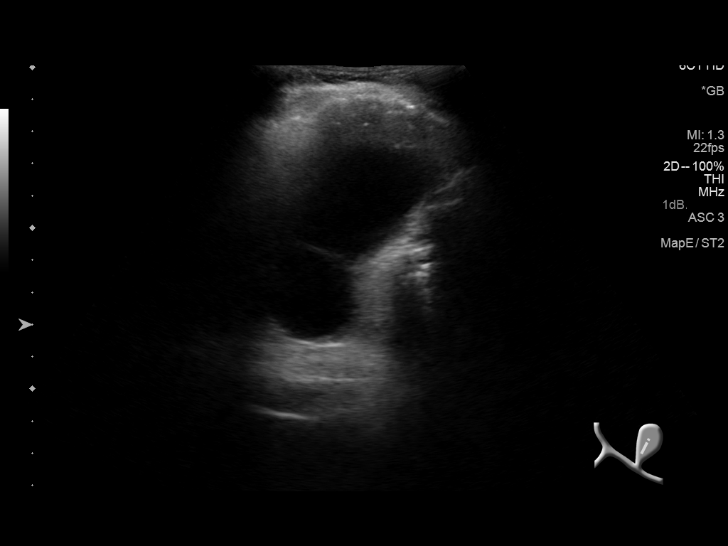
[im 6/64]
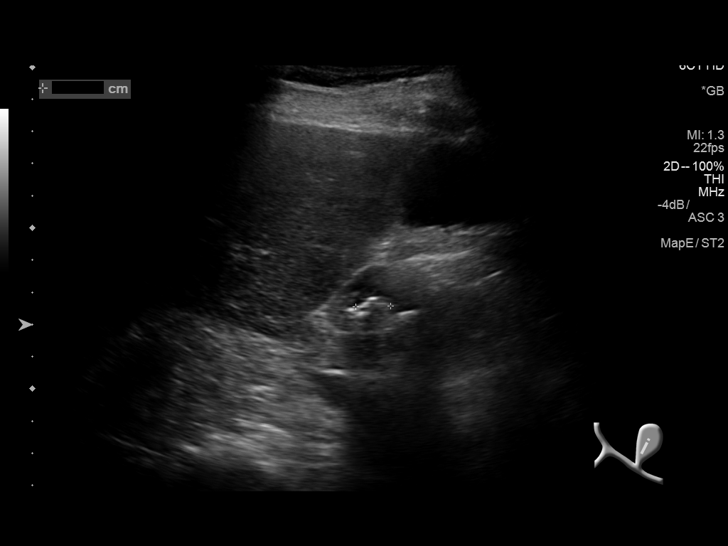
[im 11/64]
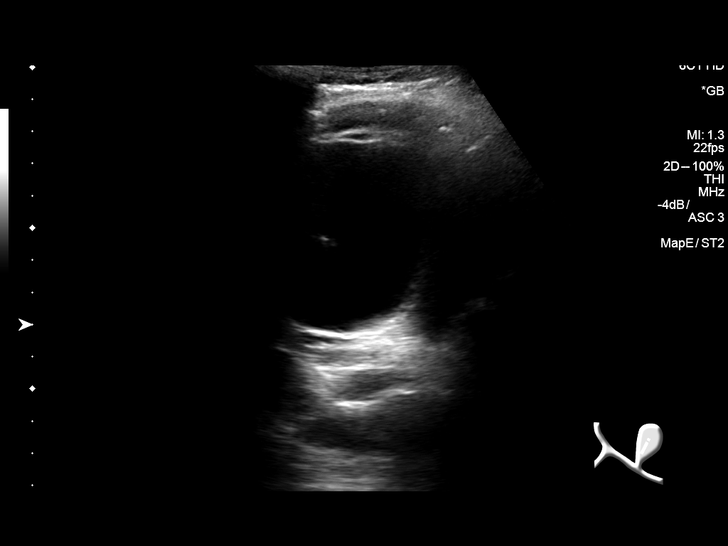
[im 16/64]
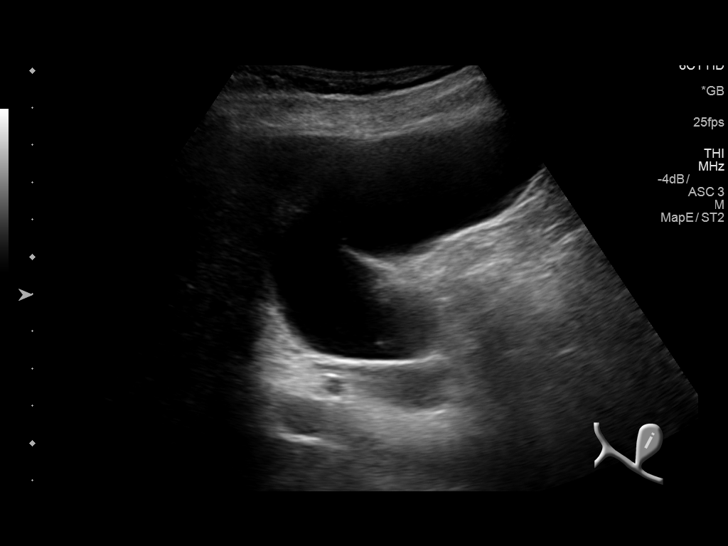
[im 22/64]
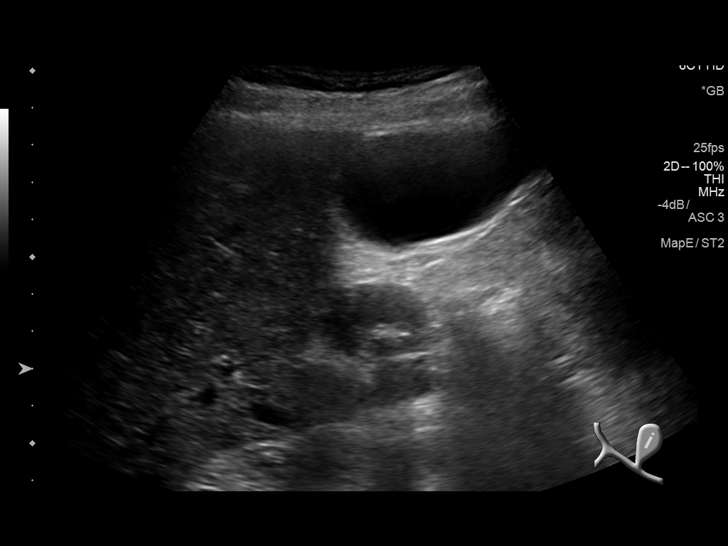
[im 24/64]
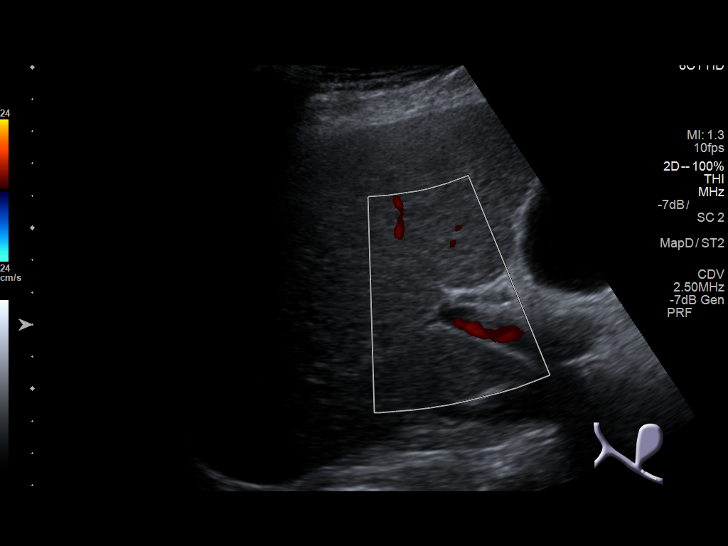
[im 29/64]
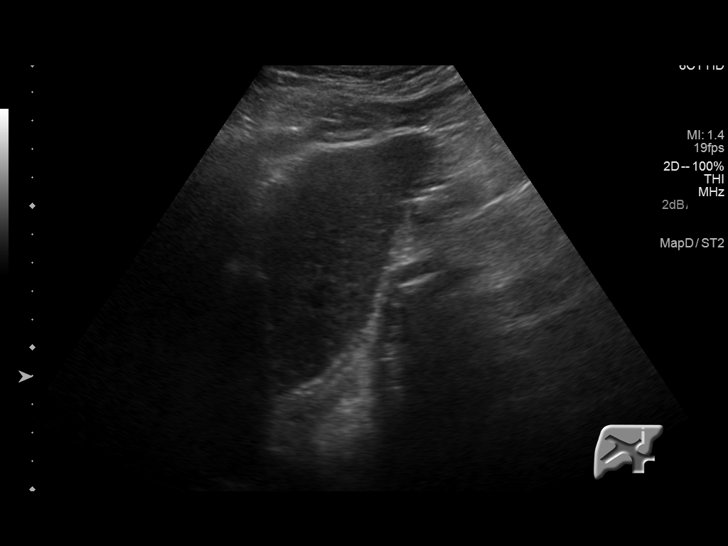
[im 35/64]
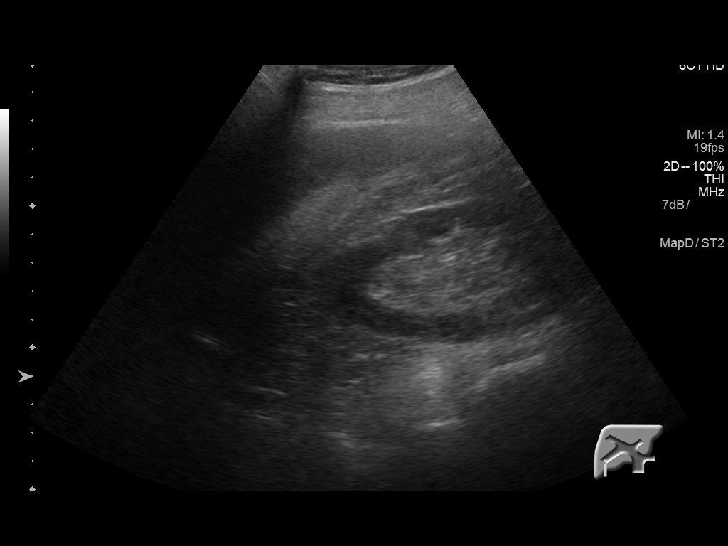
[im 40/64]
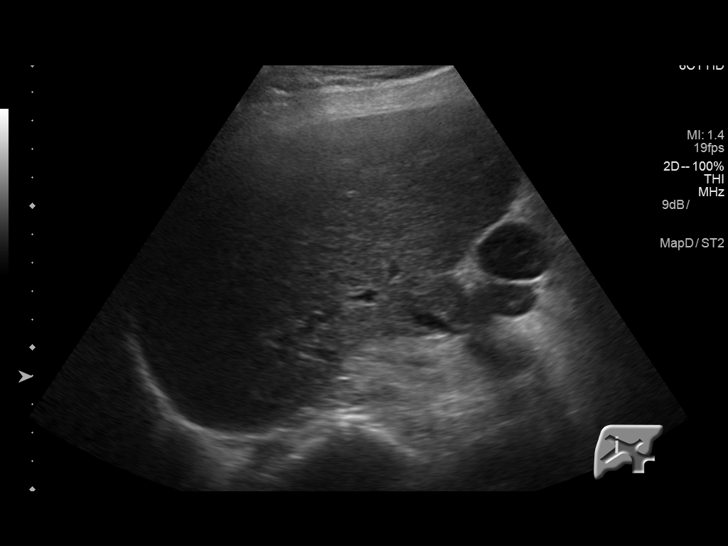
[im 43/64]
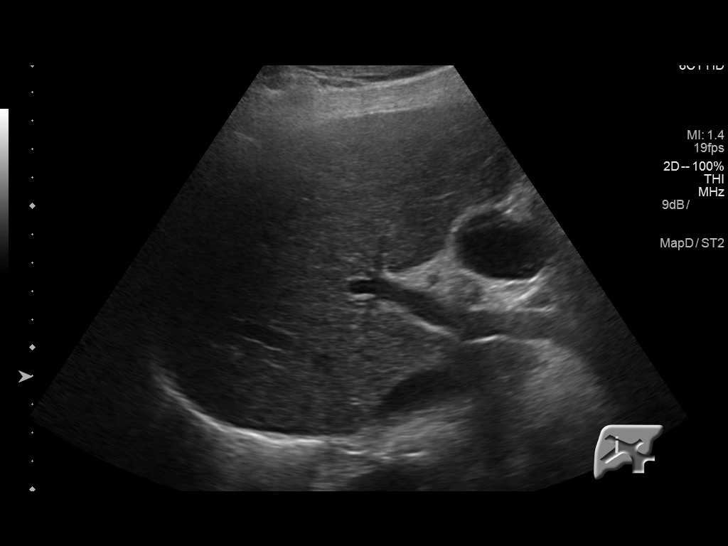
[im 48/64]
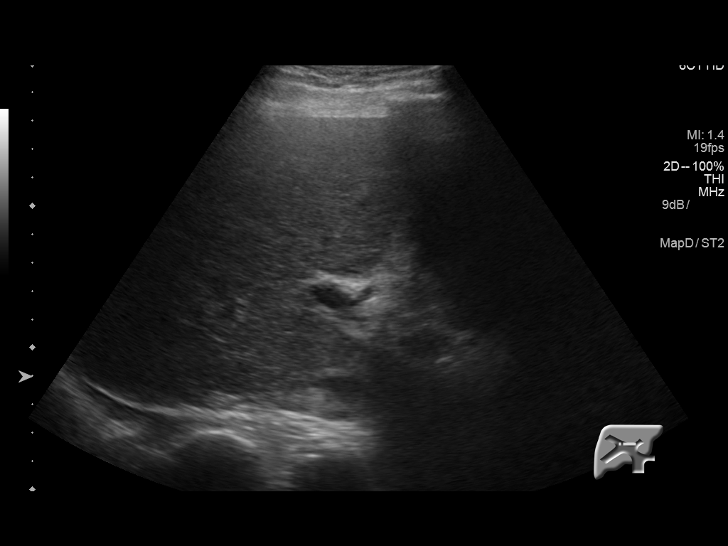
[im 53/64]
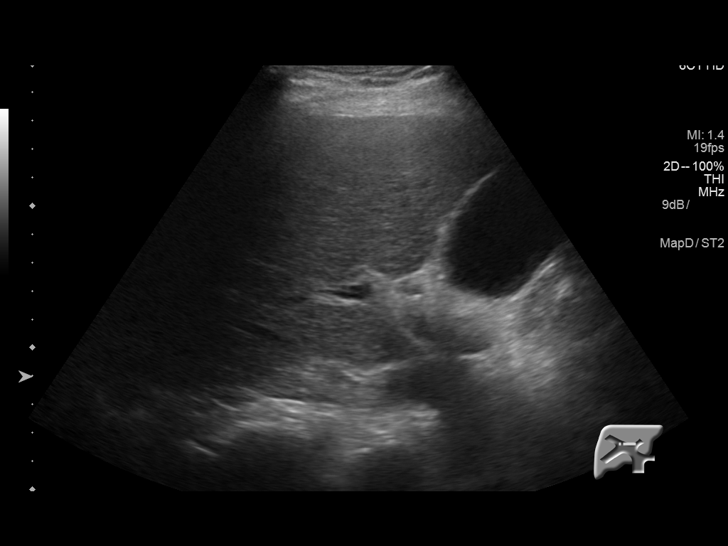
[im 58/64]
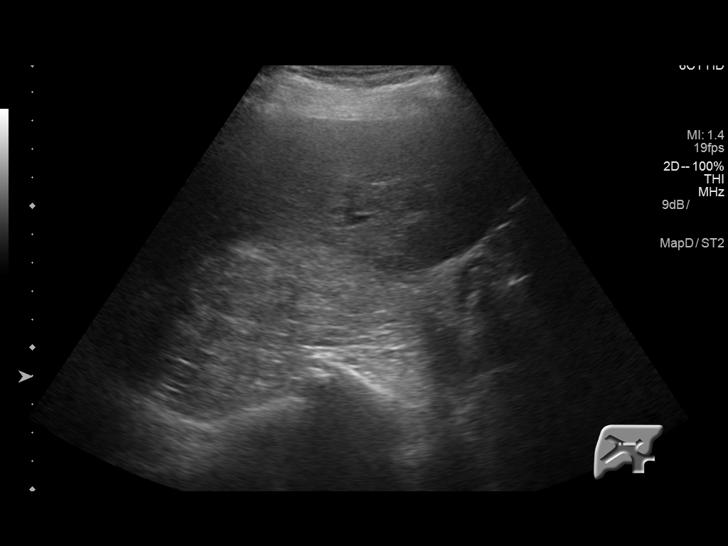
[im 64/64]
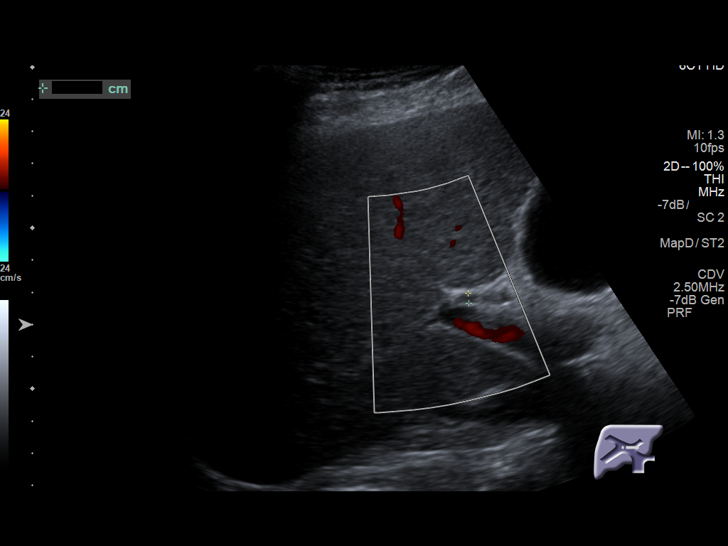

[14 of 25 positions shown; findings below may reference images not displayed]

FINDINGS: Gallbladder:

Cholelithiasis with several stones over the dependent portion of the
gallbladder/gallbladder neck. No wall thickening. Negative
sonographic Murphy's sign. No adjacent free fluid.

Common bile duct:

Diameter: 2.9 mm.

Liver:

No focal lesion identified. Within normal limits in parenchymal
echogenicity.
IMPRESSION: Cholelithiasis without additional sonographic evidence to suggest
cholecystitis.

## 2017-05-12 ENCOUNTER — Encounter (HOSPITAL_COMMUNITY): Payer: Medicare Other

## 2017-05-12 ENCOUNTER — Ambulatory Visit: Payer: Medicare Other | Admitting: Surgery
